# Patient Record
Sex: Male | Born: 1940
Health system: Southern US, Community
[De-identification: ages and names within clinical notes are randomized; demographics above are authoritative.]

## PROBLEM LIST (undated history)

## (undated) DIAGNOSIS — I2510 Atherosclerotic heart disease of native coronary artery without angina pectoris: Secondary | ICD-10-CM

## (undated) DIAGNOSIS — I700 Atherosclerosis of aorta: Secondary | ICD-10-CM

## (undated) DIAGNOSIS — G609 Hereditary and idiopathic neuropathy, unspecified: Secondary | ICD-10-CM

## (undated) DIAGNOSIS — H269 Unspecified cataract: Secondary | ICD-10-CM

## (undated) DIAGNOSIS — B019 Varicella without complication: Secondary | ICD-10-CM

## (undated) DIAGNOSIS — R42 Dizziness and giddiness: Secondary | ICD-10-CM

## (undated) DIAGNOSIS — R1310 Dysphagia, unspecified: Secondary | ICD-10-CM

## (undated) DIAGNOSIS — E079 Disorder of thyroid, unspecified: Secondary | ICD-10-CM

## (undated) DIAGNOSIS — E785 Hyperlipidemia, unspecified: Secondary | ICD-10-CM

## (undated) DIAGNOSIS — M549 Dorsalgia, unspecified: Secondary | ICD-10-CM

## (undated) DIAGNOSIS — E039 Hypothyroidism, unspecified: Secondary | ICD-10-CM

## (undated) DIAGNOSIS — I10 Essential (primary) hypertension: Secondary | ICD-10-CM

## (undated) DIAGNOSIS — B059 Measles without complication: Secondary | ICD-10-CM

## (undated) DIAGNOSIS — B269 Mumps without complication: Secondary | ICD-10-CM

## (undated) DIAGNOSIS — E669 Obesity, unspecified: Secondary | ICD-10-CM

## (undated) DIAGNOSIS — E119 Type 2 diabetes mellitus without complications: Secondary | ICD-10-CM

## (undated) DIAGNOSIS — K37 Unspecified appendicitis: Secondary | ICD-10-CM

## (undated) DIAGNOSIS — C439 Malignant melanoma of skin, unspecified: Secondary | ICD-10-CM

## (undated) DIAGNOSIS — E1169 Type 2 diabetes mellitus with other specified complication: Secondary | ICD-10-CM

## (undated) DIAGNOSIS — Z0000 Encounter for general adult medical examination without abnormal findings: Secondary | ICD-10-CM

## (undated) HISTORY — DX: Type 2 diabetes mellitus without complications: E11.9

## (undated) HISTORY — DX: Mumps without complication: B26.9

## (undated) HISTORY — DX: Obesity, unspecified: E66.9

## (undated) HISTORY — DX: Dizziness and giddiness: R42

## (undated) HISTORY — DX: Atherosclerotic heart disease of native coronary artery without angina pectoris: I25.10

## (undated) HISTORY — DX: Dysphagia, unspecified: R13.10

## (undated) HISTORY — PX: KNEE ARTHROSCOPY: SHX127

## (undated) HISTORY — PX: CORONARY ANGIOPLASTY WITH STENT PLACEMENT: SHX49

## (undated) HISTORY — DX: Hyperlipidemia, unspecified: E78.5

## (undated) HISTORY — DX: Measles without complication: B05.9

## (undated) HISTORY — DX: Dorsalgia, unspecified: M54.9

## (undated) HISTORY — PX: APPENDECTOMY: SHX54

## (undated) HISTORY — DX: Unspecified cataract: H26.9

## (undated) HISTORY — DX: Disorder of thyroid, unspecified: E07.9

## (undated) HISTORY — DX: Encounter for general adult medical examination without abnormal findings: Z00.00

## (undated) HISTORY — PX: BACK SURGERY: SHX140

## (undated) HISTORY — DX: Unspecified appendicitis: K37

## (undated) HISTORY — DX: Essential (primary) hypertension: I10

## (undated) HISTORY — PX: CARDIAC CATHETERIZATION: SHX172

## (undated) HISTORY — DX: Hypothyroidism, unspecified: E03.9

## (undated) HISTORY — DX: Type 2 diabetes mellitus with other specified complication: E11.69

## (undated) HISTORY — DX: Malignant melanoma of skin, unspecified: C43.9

## (undated) HISTORY — DX: Varicella without complication: B01.9

## (undated) HISTORY — DX: Hereditary and idiopathic neuropathy, unspecified: G60.9

## (undated) HISTORY — DX: Atherosclerosis of aorta: I70.0

## (undated) HISTORY — PX: TONSILLECTOMY AND ADENOIDECTOMY: SUR1326

## (undated) HISTORY — PX: ROTATOR CUFF REPAIR: SHX139

## (undated) HISTORY — PX: SKIN CANCER EXCISION: SHX779

## (undated) HISTORY — PX: WISDOM TOOTH EXTRACTION: SHX21

---

## 2011-12-27 LAB — HM COLONOSCOPY

## 2013-10-30 DIAGNOSIS — R059 Cough, unspecified: Secondary | ICD-10-CM | POA: Diagnosis not present

## 2013-10-30 DIAGNOSIS — J00 Acute nasopharyngitis [common cold]: Secondary | ICD-10-CM | POA: Diagnosis not present

## 2013-10-31 DIAGNOSIS — R059 Cough, unspecified: Secondary | ICD-10-CM | POA: Diagnosis not present

## 2013-10-31 DIAGNOSIS — J209 Acute bronchitis, unspecified: Secondary | ICD-10-CM | POA: Diagnosis not present

## 2013-12-04 DIAGNOSIS — E1149 Type 2 diabetes mellitus with other diabetic neurological complication: Secondary | ICD-10-CM | POA: Diagnosis not present

## 2013-12-04 DIAGNOSIS — I2510 Atherosclerotic heart disease of native coronary artery without angina pectoris: Secondary | ICD-10-CM | POA: Diagnosis not present

## 2013-12-04 DIAGNOSIS — E119 Type 2 diabetes mellitus without complications: Secondary | ICD-10-CM | POA: Diagnosis not present

## 2013-12-04 DIAGNOSIS — E1139 Type 2 diabetes mellitus with other diabetic ophthalmic complication: Secondary | ICD-10-CM | POA: Diagnosis not present

## 2013-12-04 DIAGNOSIS — E1129 Type 2 diabetes mellitus with other diabetic kidney complication: Secondary | ICD-10-CM | POA: Diagnosis not present

## 2013-12-04 DIAGNOSIS — H348392 Tributary (branch) retinal vein occlusion, unspecified eye, stable: Secondary | ICD-10-CM | POA: Diagnosis not present

## 2013-12-04 DIAGNOSIS — C439 Malignant melanoma of skin, unspecified: Secondary | ICD-10-CM | POA: Diagnosis not present

## 2013-12-04 DIAGNOSIS — R809 Proteinuria, unspecified: Secondary | ICD-10-CM | POA: Diagnosis not present

## 2013-12-04 DIAGNOSIS — E039 Hypothyroidism, unspecified: Secondary | ICD-10-CM | POA: Diagnosis not present

## 2013-12-12 ENCOUNTER — Encounter: Payer: Self-pay | Admitting: Physician Assistant

## 2013-12-12 ENCOUNTER — Ambulatory Visit (INDEPENDENT_AMBULATORY_CARE_PROVIDER_SITE_OTHER): Payer: Medicare Other | Admitting: Physician Assistant

## 2013-12-12 VITALS — BP 124/76 | HR 67 | Temp 97.7°F | Resp 16 | Ht 71.0 in | Wt 216.2 lb

## 2013-12-12 DIAGNOSIS — E039 Hypothyroidism, unspecified: Secondary | ICD-10-CM

## 2013-12-12 DIAGNOSIS — E785 Hyperlipidemia, unspecified: Secondary | ICD-10-CM | POA: Diagnosis not present

## 2013-12-12 DIAGNOSIS — E119 Type 2 diabetes mellitus without complications: Secondary | ICD-10-CM | POA: Diagnosis not present

## 2013-12-12 LAB — BASIC METABOLIC PANEL WITH GFR
BUN: 11 mg/dL (ref 6–23)
CO2: 25 mEq/L (ref 19–32)
Calcium: 9.7 mg/dL (ref 8.4–10.5)
Chloride: 103 mEq/L (ref 96–112)
Creat: 1 mg/dL (ref 0.50–1.35)
GFR, Est African American: 87 mL/min
GFR, Est Non African American: 75 mL/min
Glucose, Bld: 118 mg/dL — ABNORMAL HIGH (ref 70–99)
Potassium: 4.5 mEq/L (ref 3.5–5.3)
Sodium: 138 mEq/L (ref 135–145)

## 2013-12-12 LAB — TSH: TSH: 1.211 u[IU]/mL (ref 0.350–4.500)

## 2013-12-12 LAB — T4, FREE: Free T4: 1.21 ng/dL (ref 0.80–1.80)

## 2013-12-12 NOTE — Patient Instructions (Signed)
Please obtain labs. I will call you with your results. We will change thyroid medication as needed and schedule follow-up.

## 2013-12-12 NOTE — Progress Notes (Signed)
Pre visit review using our clinic review tool, if applicable. No additional management support is needed unless otherwise documented below in the visit note/SLS  

## 2013-12-13 LAB — URINALYSIS, ROUTINE W REFLEX MICROSCOPIC
Bilirubin Urine: NEGATIVE
Glucose, UA: NEGATIVE mg/dL
Hgb urine dipstick: NEGATIVE
Ketones, ur: NEGATIVE mg/dL
Leukocytes, UA: NEGATIVE
Nitrite: NEGATIVE
Protein, ur: NEGATIVE mg/dL
Specific Gravity, Urine: 1.022 (ref 1.005–1.030)
Urobilinogen, UA: 0.2 mg/dL (ref 0.0–1.0)
pH: 5 (ref 5.0–8.0)

## 2013-12-13 LAB — URINALYSIS, MICROSCOPIC ONLY
Bacteria, UA: NONE SEEN
Casts: NONE SEEN
Crystals: NONE SEEN
Squamous Epithelial / LPF: NONE SEEN

## 2013-12-13 LAB — MICROALBUMIN / CREATININE URINE RATIO
Creatinine, Urine: 253.2 mg/dL
Microalb Creat Ratio: 3.3 mg/g (ref 0.0–30.0)
Microalb, Ur: 0.83 mg/dL (ref 0.00–1.89)

## 2013-12-17 ENCOUNTER — Telehealth: Payer: Self-pay | Admitting: Physician Assistant

## 2013-12-17 NOTE — Telephone Encounter (Signed)
Patient informed, understood & agreed; he will drop off previous medical records at office/SLS

## 2013-12-17 NOTE — Telephone Encounter (Signed)
Patient had labs 12-12-2013.  Would like results

## 2013-12-22 DIAGNOSIS — E119 Type 2 diabetes mellitus without complications: Secondary | ICD-10-CM | POA: Insufficient documentation

## 2013-12-22 DIAGNOSIS — E039 Hypothyroidism, unspecified: Secondary | ICD-10-CM | POA: Insufficient documentation

## 2013-12-22 DIAGNOSIS — E785 Hyperlipidemia, unspecified: Secondary | ICD-10-CM | POA: Insufficient documentation

## 2013-12-22 NOTE — Progress Notes (Signed)
Patient ID: Jack Wade, male   DOB: 08-15-1941, 72 y.o.   MRN: [id number]  Patient presents to clinic for medication management and recheck of thyroid. Patient has never been seen in this clinic before. Patient is seeing this provider for an acute visit (OK'd by management) and will be establishing care with Dr. Abner Greenspan at the beginning of 2015.   patient suffers from hypothyroidism, for which he takes levothyroxine. Patient states he saw his previous primary care provider 2 weeks ago and his TSH levels were low. Patient is concerned that he may be on too much thyroid medication. Patient denies anxiety, palpitations, heat intolerance, skin changes. Does endorse small amount of weight loss over the past few months. Denies constitutional symptoms. Patient is still been taking his levothyroxine as prescribed.  Patient also requesting refill of his Januvia. Patient has diagnosis of type 2 diabetes for which he takes metformin, glipizide and Januvia. Has been a Januvia for several months. Patient states he just had his A1c checked by his previous PCP and states it was slightly elevated. We'll need to obtain records. Can recheck blood glucose. Discussed with patient however, that Medicare would not pay for another A1C his last A1c was drawn two weeks ago. Patient denies urinary frequency, foot ulcer, neuropathy or change in vision. Patient has seen the eye doctor within the past year. Patient is not currently on ACE inhibitor therapy.   Past Medical History  Diagnosis Date  . Hyperlipidemia   . Hypertension   . Heart disease     2 stents  . Hypothyroidism   . Thyroid disease     Beoming Hyperthyroidism  . Melanoma     Scalp  . Diabetes type 2, controlled   . Chicken pox   . Measles   . Appendicitis     No current outpatient prescriptions on file prior to visit.   No current facility-administered medications on file prior to visit.    No Known Allergies  Family History  Problem Relation  Age of Onset  . Heart disease Father   . Emphysema Father   . Lung cancer Father     Deceased  . Breast cancer Mother   . Heart disease Mother     Deceased  . Colon cancer Maternal Grandmother   . Diabetes Brother   . Heart disease Brother   . Healthy Sister     History   Social History  . Marital Status: Married    Spouse Name: N/A    Number of Children: N/A  . Years of Education: N/A   Social History Main Topics  . Smoking status: Former Games developer  . Smokeless tobacco: Never Used  . Alcohol Use: No  . Drug Use: No  . Sexual Activity: No   Other Topics Concern  . None   Social History Narrative  . None   Review of Systems - See HPI.  All other ROS are negative.  Filed Vitals:   12/12/13 1102  BP: 124/76  Pulse: 67  Temp: 97.7 F (36.5 C)  Resp: 16   Physical Exam  Vitals reviewed. Constitutional: He is oriented to person, place, and time and well-developed, well-nourished, and in no distress.  HENT:  Head: Normocephalic and atraumatic.  Right Ear: External ear normal.  Left Ear: External ear normal.  Nose: Nose normal.  Mouth/Throat: Oropharynx is clear and moist. No oropharyngeal exudate.  Tympanic membranes within normal limits bilaterally.  Eyes: Conjunctivae and EOM are normal. Pupils are equal, round, and  reactive to light.  Neck: Neck supple.  Cardiovascular: Normal rate, regular rhythm, normal heart sounds and intact distal pulses.   Pulmonary/Chest: Effort normal and breath sounds normal. No respiratory distress. He has no wheezes. He has no rales. He exhibits no tenderness.  Abdominal: Soft. Bowel sounds are normal. He exhibits no distension and no mass. There is no tenderness. There is no rebound and no guarding.  Lymphadenopathy:    He has no cervical adenopathy.  Neurological: He is alert and oriented to person, place, and time.  Skin: Skin is warm and dry. No rash noted.  Psychiatric: Affect normal.   Assessment/Plan: Diabetes We'll  obtain A1c from prior PCP. Sample of Januvia given to patient. Will check BMP, urinalysis and urine microalbumin.  Hypothyroidism Recheck TSH and free T4. Will Alter dosage of levothyroxine if necessary.

## 2013-12-22 NOTE — Assessment & Plan Note (Signed)
We'll obtain A1c from prior PCP. Sample of Januvia given to patient. Will check BMP, urinalysis and urine microalbumin.

## 2013-12-22 NOTE — Assessment & Plan Note (Signed)
Recheck TSH and free T4. Will Alter dosage of levothyroxine if necessary.

## 2014-03-10 ENCOUNTER — Ambulatory Visit: Payer: Medicare Other | Admitting: Family Medicine

## 2014-03-13 ENCOUNTER — Ambulatory Visit: Payer: Medicare Other | Admitting: Family Medicine

## 2014-03-20 ENCOUNTER — Ambulatory Visit (INDEPENDENT_AMBULATORY_CARE_PROVIDER_SITE_OTHER): Payer: Medicare Other | Admitting: Family Medicine

## 2014-03-20 ENCOUNTER — Encounter: Payer: Self-pay | Admitting: Family Medicine

## 2014-03-20 ENCOUNTER — Telehealth: Payer: Self-pay | Admitting: Family Medicine

## 2014-03-20 VITALS — BP 118/80 | HR 63 | Temp 97.7°F | Ht 72.0 in | Wt 219.0 lb

## 2014-03-20 DIAGNOSIS — E039 Hypothyroidism, unspecified: Secondary | ICD-10-CM

## 2014-03-20 DIAGNOSIS — E785 Hyperlipidemia, unspecified: Secondary | ICD-10-CM

## 2014-03-20 DIAGNOSIS — M25579 Pain in unspecified ankle and joints of unspecified foot: Secondary | ICD-10-CM

## 2014-03-20 DIAGNOSIS — Z23 Encounter for immunization: Secondary | ICD-10-CM

## 2014-03-20 DIAGNOSIS — I10 Essential (primary) hypertension: Secondary | ICD-10-CM | POA: Diagnosis not present

## 2014-03-20 DIAGNOSIS — Z87891 Personal history of nicotine dependence: Secondary | ICD-10-CM

## 2014-03-20 DIAGNOSIS — Z85820 Personal history of malignant melanoma of skin: Secondary | ICD-10-CM | POA: Insufficient documentation

## 2014-03-20 DIAGNOSIS — C439 Malignant melanoma of skin, unspecified: Secondary | ICD-10-CM | POA: Insufficient documentation

## 2014-03-20 DIAGNOSIS — I25810 Atherosclerosis of coronary artery bypass graft(s) without angina pectoris: Secondary | ICD-10-CM | POA: Diagnosis not present

## 2014-03-20 DIAGNOSIS — E119 Type 2 diabetes mellitus without complications: Secondary | ICD-10-CM | POA: Diagnosis not present

## 2014-03-20 DIAGNOSIS — R1310 Dysphagia, unspecified: Secondary | ICD-10-CM

## 2014-03-20 DIAGNOSIS — Z0000 Encounter for general adult medical examination without abnormal findings: Secondary | ICD-10-CM

## 2014-03-20 HISTORY — DX: Dysphagia, unspecified: R13.10

## 2014-03-20 LAB — T4, FREE: Free T4: 1.3 ng/dL (ref 0.80–1.80)

## 2014-03-20 LAB — RENAL FUNCTION PANEL
Albumin: 4.1 g/dL (ref 3.5–5.2)
BUN: 14 mg/dL (ref 6–23)
CO2: 25 mEq/L (ref 19–32)
Calcium: 8.8 mg/dL (ref 8.4–10.5)
Chloride: 106 mEq/L (ref 96–112)
Creat: 1.07 mg/dL (ref 0.50–1.35)
Glucose, Bld: 127 mg/dL — ABNORMAL HIGH (ref 70–99)
Phosphorus: 2.1 mg/dL — ABNORMAL LOW (ref 2.3–4.6)
Potassium: 4.1 mEq/L (ref 3.5–5.3)
Sodium: 138 mEq/L (ref 135–145)

## 2014-03-20 LAB — CBC
HCT: 42.5 % (ref 39.0–52.0)
Hemoglobin: 14.8 g/dL (ref 13.0–17.0)
MCH: 28.1 pg (ref 26.0–34.0)
MCHC: 34.8 g/dL (ref 30.0–36.0)
MCV: 80.8 fL (ref 78.0–100.0)
Platelets: 214 10*3/uL (ref 150–400)
RBC: 5.26 MIL/uL (ref 4.22–5.81)
RDW: 13.9 % (ref 11.5–15.5)
WBC: 7.2 10*3/uL (ref 4.0–10.5)

## 2014-03-20 LAB — HEPATIC FUNCTION PANEL
ALT: 15 U/L (ref 0–53)
AST: 20 U/L (ref 0–37)
Albumin: 4.1 g/dL (ref 3.5–5.2)
Alkaline Phosphatase: 60 U/L (ref 39–117)
Bilirubin, Direct: 0.1 mg/dL (ref 0.0–0.3)
Indirect Bilirubin: 0.2 mg/dL (ref 0.2–1.2)
Total Bilirubin: 0.3 mg/dL (ref 0.2–1.2)
Total Protein: 6.6 g/dL (ref 6.0–8.3)

## 2014-03-20 LAB — HEMOGLOBIN A1C
Hgb A1c MFr Bld: 7.3 % — ABNORMAL HIGH (ref <5.7)
Mean Plasma Glucose: 163 mg/dL — ABNORMAL HIGH (ref <117)

## 2014-03-20 LAB — LIPID PANEL
Cholesterol: 130 mg/dL (ref 0–200)
HDL: 37 mg/dL — ABNORMAL LOW (ref >39)
LDL Cholesterol: 49 mg/dL (ref 0–99)
Total CHOL/HDL Ratio: 3.5 Ratio
Triglycerides: 220 mg/dL — ABNORMAL HIGH (ref <150)
VLDL: 44 mg/dL — ABNORMAL HIGH (ref 0–40)

## 2014-03-20 LAB — TSH: TSH: 2.571 u[IU]/mL (ref 0.350–4.500)

## 2014-03-20 NOTE — Progress Notes (Signed)
Pre visit review using our clinic review tool, if applicable. No additional management support is needed unless otherwise documented below in the visit note. 

## 2014-03-20 NOTE — Telephone Encounter (Signed)
Relevant patient education assigned to patient using Emmi. ° °

## 2014-03-20 NOTE — Progress Notes (Signed)
Patient ID: Jack Wade, male   DOB: [birth year], 73 y.o.   MRN: [id number] Jack Wade [id number] 1941-11-14 03/20/2014      Progress Note-Follow Up  Subjective  Chief Complaint  Chief Complaint  Patient presents with  . Establish Care    new patient  . Injections    tdap    HPI  Patient is a 73 year old male in today for routine medical care. He is here to establish care. He is generally done well but does offer some mild concerns. One some intermittent pain in the ball of his right foot. No redness or warmth. No injury. It is not present today. He also some intermittent low back pain. He wears glasses. He quit smoking many years ago. Notes some mild dysphasia occasionally. Has recenty moved to the area. Denies CP/palp/SOB/HA/congestion/fevers/GI or GU c/o. Taking meds as prescribed  Past Medical History  Diagnosis Date  . Hyperlipidemia   . Hypertension   . Heart disease     2 stents  . Hypothyroidism   . Thyroid disease     Beoming Hyperthyroidism  . Melanoma     Scalp  . Diabetes type 2, controlled   . Chicken pox   . Measles   . Appendicitis   . Mumps as a child    Past Surgical History  Procedure Laterality Date  . Appendectomy    . Coronary angioplasty with stent placement      2 stents  . Cardiac catheterization    . Back surgery      x2 lumbar  . Rotator cuff repair    . Wisdom tooth extraction    . Tonsillectomy and adenoidectomy      Family History  Problem Relation Age of Onset  . Heart disease Father   . Emphysema Father   . Hypertension Father   . Cancer Father     lung/ healthy  . Heart disease Mother     Deceased  . Cancer Mother     breast  . Hypertension Mother   . Cancer Maternal Grandmother     colon  . Diabetes Brother     type 2  . Heart disease Brother     History   Social History  . Marital Status: Married    Spouse Name: N/A    Number of Children: N/A  . Years of Education: N/A   Occupational History   . Not on file.   Social History Main Topics  . Smoking status: Former Smoker -- 2.00 packs/day for 30 years    Types: Cigarettes    Start date: 12/27/1983  . Smokeless tobacco: Never Used  . Alcohol Use: Yes     Comment: occasionally  . Drug Use: No  . Sexual Activity: No   Other Topics Concern  . Not on file   Social History Narrative  . No narrative on file    Current Outpatient Prescriptions on File Prior to Visit  Medication Sig Dispense Refill  . aspirin 81 MG tablet Take 81 mg by mouth daily.      Marland Kitchen glipiZIDE (GLUCOTROL) 5 MG tablet Take 20 mg by mouth 2 (two) times daily before a meal.      . levothyroxine (SYNTHROID, LEVOTHROID) 100 MCG tablet Take 100 mcg by mouth daily before breakfast.      . metFORMIN (GLUCOPHAGE) 500 MG tablet Take 2,000 mg by mouth daily with breakfast.      . simvastatin (ZOCOR) 20 MG tablet Take 20  mg by mouth at bedtime.      . sitaGLIPtin (JANUVIA) 100 MG tablet Take 100 mg by mouth daily.       No current facility-administered medications on file prior to visit.    No Known Allergies  Review of Systems  Review of Systems  Constitutional: Negative for fever and malaise/fatigue.  HENT: Negative for congestion.   Eyes: Negative for discharge.  Respiratory: Negative for shortness of breath.   Cardiovascular: Negative for chest pain, palpitations and leg swelling.  Gastrointestinal: Negative for nausea, abdominal pain and diarrhea.  Genitourinary: Negative for dysuria.  Musculoskeletal: Negative for falls.  Skin: Negative for rash.  Neurological: Negative for loss of consciousness and headaches.  Endo/Heme/Allergies: Negative for polydipsia.  Psychiatric/Behavioral: Negative for depression and suicidal ideas. The patient is not nervous/anxious and does not have insomnia.     Objective  BP 118/80  Pulse 63  Temp(Src) 97.7 F (36.5 C) (Oral)  Ht 6' (1.829 m)  Wt 219 lb 0.6 oz (99.356 kg)  BMI 29.70 kg/m2  SpO2 97%  Physical  Exam  Physical Exam  Constitutional: He is oriented to person, place, and time and well-developed, well-nourished, and in no distress. No distress.  HENT:  Head: Normocephalic and atraumatic.  Eyes: Conjunctivae are normal.  Neck: Neck supple. No thyromegaly present.  Cardiovascular: Normal rate, regular rhythm and normal heart sounds.   No murmur heard. Pulmonary/Chest: Effort normal and breath sounds normal. No respiratory distress.  Abdominal: He exhibits no distension and no mass. There is no tenderness.  Musculoskeletal: He exhibits no edema.  Neurological: He is alert and oriented to person, place, and time.  Skin: Skin is warm.  Psychiatric: Memory, affect and judgment normal.    Lab Results  Component Value Date   TSH 1.211 12/12/2013   No results found for this basename: WBC, HGB, HCT, MCV, PLT   Lab Results  Component Value Date   CREATININE 1.00 12/12/2013   BUN 11 12/12/2013   NA 138 12/12/2013   K 4.5 12/12/2013   CL 103 12/12/2013   CO2 25 12/12/2013    Assessment & Plan  Hypothyroidism On Levothyroxine, continue to monitor  Melanoma His wife is seeing a dermatologist soon, he will establish care there if he is please  Preventative health care tdap today, will obtain and review old records  Pain in joint, ankle and foot Tolerable but intermittent. Declines referral, consider  Orthotics and topical treatments  Hyperlipidemia Tolerating statin, encouraged heart healthy diet, avoid trans fats, minimize simple carbs and saturated fats. Increase exercise as tolerated  HTN (hypertension) Well controlled, no changes to meds. Encouraged heart healthy diet such as the DASH diet and exercise as tolerated.   Diabetes hgba1c acceptable, minimize simple carbs. Increase exercise as tolerated. Continue current meds  CAD (coronary artery disease) Is doing well but is new to the area. Will refer to cardiology for further surveillance

## 2014-03-20 NOTE — Patient Instructions (Signed)
Preventive Care for Adults, Male A healthy lifestyle and preventive care can promote health and wellness. Preventive health guidelines for men include the following key practices:  A routine yearly physical is a good way to check with your health care provider about your health and preventative screening. It is a chance to share any concerns and updates on your health and to receive a thorough exam.  Visit your dentist for a routine exam and preventative care every 6 months. Brush your teeth twice a day and floss once a day. Good oral hygiene prevents tooth decay and gum disease.  The frequency of eye exams is based on your age, health, family medical history, use of contact lenses, and other factors. Follow your health care provider's recommendations for frequency of eye exams.  Eat a healthy diet. Foods such as vegetables, fruits, whole grains, low-fat dairy products, and lean protein foods contain the nutrients you need without too many calories. Decrease your intake of foods high in solid fats, added sugars, and salt. Eat the right amount of calories for you.Get information about a proper diet from your health care provider, if necessary.  Regular physical exercise is one of the most important things you can do for your health. Most adults should get at least 150 minutes of moderate-intensity exercise (any activity that increases your heart rate and causes you to sweat) each week. In addition, most adults need muscle-strengthening exercises on 2 or more days a week.  Maintain a healthy weight. The body mass index (BMI) is a screening tool to identify possible weight problems. It provides an estimate of body fat based on height and weight. Your health care provider can find your BMI and can help you achieve or maintain a healthy weight.For adults 20 years and older:  A BMI below 18.5 is considered underweight.  A BMI of 18.5 to 24.9 is normal.  A BMI of 25 to 29.9 is considered  overweight.  A BMI of 30 and above is considered obese.  Maintain normal blood lipids and cholesterol levels by exercising and minimizing your intake of saturated fat. Eat a balanced diet with plenty of fruit and vegetables. Blood tests for lipids and cholesterol should begin at age 42 and be repeated every 5 years. If your lipid or cholesterol levels are high, you are over 50, or you are at high risk for heart disease, you may need your cholesterol levels checked more frequently.Ongoing high lipid and cholesterol levels should be treated with medicines if diet and exercise are not working.  If you smoke, find out from your health care provider how to quit. If you do not use tobacco, do not start.  Lung cancer screening is recommended for adults aged 24 80 years who are at high risk for developing lung cancer because of a history of smoking. A yearly low-dose CT scan of the lungs is recommended for people who have at least a 30-pack-year history of smoking and are a current smoker or have quit within the past 15 years. A pack year of smoking is smoking an average of 1 pack of cigarettes a day for 1 year (for example: 1 pack a day for 30 years or 2 packs a day for 15 years). Yearly screening should continue until the smoker has stopped smoking for at least 15 years. Yearly screening should be stopped for people who develop a health problem that would prevent them from having lung cancer treatment.  If you choose to drink alcohol, do not have  more than 2 drinks per day. One drink is considered to be 12 ounces (355 mL) of beer, 5 ounces (148 mL) of wine, or 1.5 ounces (44 mL) of liquor.  Avoid use of street drugs. Do not share needles with anyone. Ask for help if you need support or instructions about stopping the use of drugs.  High blood pressure causes heart disease and increases the risk of stroke. Your blood pressure should be checked at least every 1 2 years. Ongoing high blood pressure should be  treated with medicines, if weight loss and exercise are not effective.  If you are 75 72 years old, ask your health care provider if you should take aspirin to prevent heart disease.  Diabetes screening involves taking a blood sample to check your fasting blood sugar level. This should be done once every 3 years, after age 19, if you are within normal weight and without risk factors for diabetes. Testing should be considered at a younger age or be carried out more frequently if you are overweight and have at least 1 risk factor for diabetes.  Colorectal cancer can be detected and often prevented. Most routine colorectal cancer screening begins at the age of 47 and continues through age 80. However, your health care provider may recommend screening at an earlier age if you have risk factors for colon cancer. On a yearly basis, your health care provider may provide home test kits to check for hidden blood in the stool. Use of a small camera at the end of a tube to directly examine the colon (sigmoidoscopy or colonoscopy) can detect the earliest forms of colorectal cancer. Talk to your health care provider about this at age 66, when routine screening begins. Direct exam of the colon should be repeated every 5 10 years through age 19, unless early forms of precancerous polyps or small growths are found.  People who are at an increased risk for hepatitis B should be screened for this virus. You are considered at high risk for hepatitis B if:  You were born in a country where hepatitis B occurs often. Talk with your health care provider about which countries are considered high-risk.  Your parents were born in a high-risk country and you have not received a shot to protect against hepatitis B (hepatitis B vaccine).  You have HIV or AIDS.  You use needles to inject street drugs.  You live with, or have sex with, someone who has hepatitis B.  You are a man who has sex with other men (MSM).  You get  hemodialysis treatment.  You take certain medicines for conditions such as cancer, organ transplantation, and autoimmune conditions.  Hepatitis C blood testing is recommended for all people born from [birth year] through 1965 and any individual with known risks for hepatitis C.  Practice safe sex. Use condoms and avoid high-risk sexual practices to reduce the spread of sexually transmitted infections (STIs). STIs include gonorrhea, chlamydia, syphilis, trichomonas, herpes, HPV, and human immunodeficiency virus (HIV). Herpes, HIV, and HPV are viral illnesses that have no cure. They can result in disability, cancer, and death.  A one-time screening for abdominal aortic aneurysm (AAA) and surgical repair of large AAAs by ultrasound are recommended for men ages 94 to 74 years who are current or former smokers.  Healthy men should no longer receive prostate-specific antigen (PSA) blood tests as part of routine cancer screening. Talk with your health care provider about prostate cancer screening.  Testicular cancer screening is not recommended  for adult males who have no symptoms. Screening includes self-exam, a health care provider exam, and other screening tests. Consult with your health care provider about any symptoms you have or any concerns you have about testicular cancer.  Use sunscreen. Apply sunscreen liberally and repeatedly throughout the day. You should seek shade when your shadow is shorter than you. Protect yourself by wearing long sleeves, pants, a wide-brimmed hat, and sunglasses year round, whenever you are outdoors.  Once a month, do a whole-body skin exam, using a mirror to look at the skin on your back. Tell your health care provider about new moles, moles that have irregular borders, moles that are larger than a pencil eraser, or moles that have changed in shape or color.  Stay current with required vaccines (immunizations).  Influenza vaccine. All adults should be immunized every  year.  Tetanus, diphtheria, and acellular pertussis (Td, Tdap) vaccine. An adult who has not previously received Tdap or who does not know his vaccine status should receive 1 dose of Tdap. This initial dose should be followed by tetanus and diphtheria toxoids (Td) booster doses every 10 years. Adults with an unknown or incomplete history of completing a 3-dose immunization series with Td-containing vaccines should begin or complete a primary immunization series including a Tdap dose. Adults should receive a Td booster every 10 years.  Varicella vaccine. An adult without evidence of immunity to varicella should receive 2 doses or a second dose if he has previously received 1 dose.  Human papillomavirus (HPV) vaccine. Males aged 44 21 years who have not received the vaccine previously should receive the 3-dose series. Males aged 43 26 years may be immunized. Immunization is recommended through the age of 50 years for any male who has sex with males and did not get any or all doses earlier. Immunization is recommended for any person with an immunocompromised condition through the age of 23 years if he did not get any or all doses earlier. During the 3-dose series, the second dose should be obtained 4 8 weeks after the first dose. The third dose should be obtained 24 weeks after the first dose and 16 weeks after the second dose.  Zoster vaccine. One dose is recommended for adults aged 96 years or older unless certain conditions are present.  Measles, mumps, and rubella (MMR) vaccine. Adults born before 55 generally are considered immune to measles and mumps. Adults born in [birth year] or later should have 1 or more doses of MMR vaccine unless there is a contraindication to the vaccine or there is laboratory evidence of immunity to each of the three diseases. A routine second dose of MMR vaccine should be obtained at least 28 days after the first dose for students attending postsecondary schools, health care  workers, or international travelers. People who received inactivated measles vaccine or an unknown type of measles vaccine during 1963 1967 should receive 2 doses of MMR vaccine. People who received inactivated mumps vaccine or an unknown type of mumps vaccine before 1979 and are at high risk for mumps infection should consider immunization with 2 doses of MMR vaccine. Unvaccinated health care workers born before [birth year] who lack laboratory evidence of measles, mumps, or rubella immunity or laboratory confirmation of disease should consider measles and mumps immunization with 2 doses of MMR vaccine or rubella immunization with 1 dose of MMR vaccine.  Pneumococcal 13-valent conjugate (PCV13) vaccine. When indicated, a person who is uncertain of his immunization history and has no record of immunization  should receive the PCV13 vaccine. An adult aged 67 years or older who has certain medical conditions and has not been previously immunized should receive 1 dose of PCV13 vaccine. This PCV13 should be followed with a dose of pneumococcal polysaccharide (PPSV23) vaccine. The PPSV23 vaccine dose should be obtained at least 8 weeks after the dose of PCV13 vaccine. An adult aged 79 years or older who has certain medical conditions and previously received 1 or more doses of PPSV23 vaccine should receive 1 dose of PCV13. The PCV13 vaccine dose should be obtained 1 or more years after the last PPSV23 vaccine dose.  Pneumococcal polysaccharide (PPSV23) vaccine. When PCV13 is also indicated, PCV13 should be obtained first. All adults aged 74 years and older should be immunized. An adult younger than age 50 years who has certain medical conditions should be immunized. Any person who resides in a nursing home or long-term care facility should be immunized. An adult smoker should be immunized. People with an immunocompromised condition and certain other conditions should receive both PCV13 and PPSV23 vaccines. People with human  immunodeficiency virus (HIV) infection should be immunized as soon as possible after diagnosis. Immunization during chemotherapy or radiation therapy should be avoided. Routine use of PPSV23 vaccine is not recommended for American Indians, [place] Natives, or people younger than 65 years unless there are medical conditions that require PPSV23 vaccine. When indicated, people who have unknown immunization and have no record of immunization should receive PPSV23 vaccine. One-time revaccination 5 years after the first dose of PPSV23 is recommended for people aged 41 64 years who have chronic kidney failure, nephrotic syndrome, asplenia, or immunocompromised conditions. People who received 1 2 doses of PPSV23 before age 15 years should receive another dose of PPSV23 vaccine at age 48 years or later if at least 5 years have passed since the previous dose. Doses of PPSV23 are not needed for people immunized with PPSV23 at or after age 69 years.  Meningococcal vaccine. Adults with asplenia or persistent complement component deficiencies should receive 2 doses of quadrivalent meningococcal conjugate (MenACWY-D) vaccine. The doses should be obtained at least 2 months apart. Microbiologists working with certain meningococcal bacteria, [place] recruits, people at risk during an outbreak, and people who travel to or live in countries with a high rate of meningitis should be immunized. A first-year college student up through age 7 years who is living in a residence hall should receive a dose if he did not receive a dose on or after his 16th birthday. Adults who have certain high-risk conditions should receive one or more doses of vaccine.  Hepatitis A vaccine. Adults who wish to be protected from this disease, have certain high-risk conditions, work with hepatitis A-infected animals, work in hepatitis A research labs, or travel to or work in countries with a high rate of hepatitis A should be immunized. Adults who were  previously unvaccinated and who anticipate close contact with an international adoptee during the first 60 days after arrival in the [place] from a country with a high rate of hepatitis A should be immunized.  Hepatitis B vaccine. Adults who wish to be protected from this disease, have certain high-risk conditions, may be exposed to blood or other infectious body fluids, are household contacts or sex partners of hepatitis B positive people, are clients or workers in certain care facilities, or travel to or work in countries with a high rate of hepatitis B should be immunized.  Haemophilus influenzae type b (Hib) vaccine. A  previously unvaccinated person with asplenia or sickle cell disease or having a scheduled splenectomy should receive 1 dose of Hib vaccine. Regardless of previous immunization, a recipient of a hematopoietic stem cell transplant should receive a 3-dose series 6 12 months after his successful transplant. Hib vaccine is not recommended for adults with HIV infection. Preventive Service / Frequency Ages 62 to 3  Blood pressure check.** / Every 1 to 2 years.  Lipid and cholesterol check.** / Every 5 years beginning at age 43.  Hepatitis C blood test.** / For any individual with known risks for hepatitis C.  Skin self-exam. / Monthly.  Influenza vaccine. / Every year.  Tetanus, diphtheria, and acellular pertussis (Tdap, Td) vaccine.** / Consult your health care provider. 1 dose of Td every 10 years.  Varicella vaccine.** / Consult your health care provider.  HPV vaccine. / 3 doses over 6 months, if 48 or younger.  Measles, mumps, rubella (MMR) vaccine.** / You need at least 1 dose of MMR if you were born in 1957 or later. You may also need a second dose.  Pneumococcal 13-valent conjugate (PCV13) vaccine.** / Consult your health care provider.  Pneumococcal polysaccharide (PPSV23) vaccine.** / 1 to 2 doses if you smoke cigarettes or if you have certain  conditions.  Meningococcal vaccine.** / 1 dose if you are age 8 to 70 years and a Market researcher living in a residence hall, or have one of several medical conditions. You may also need additional booster doses.  Hepatitis A vaccine.** / Consult your health care provider.  Hepatitis B vaccine.** / Consult your health care provider.  Haemophilus influenzae type b (Hib) vaccine.** / Consult your health care provider. Ages 48 to 32  Blood pressure check.** / Every 1 to 2 years.  Lipid and cholesterol check.** / Every 5 years beginning at age 38.  Lung cancer screening. / Every year if you are aged 40 80 years and have a 30-pack-year history of smoking and currently smoke or have quit within the past 15 years. Yearly screening is stopped once you have quit smoking for at least 15 years or develop a health problem that would prevent you from having lung cancer treatment.  Fecal occult blood test (FOBT) of stool. / Every year beginning at age 4 and continuing until age 70. You may not have to do this test if you get a colonoscopy every 10 years.  Flexible sigmoidoscopy** or colonoscopy.** / Every 5 years for a flexible sigmoidoscopy or every 10 years for a colonoscopy beginning at age 76 and continuing until age 62.  Hepatitis C blood test.** / For all people born from [birth year] through 1965 and any individual with known risks for hepatitis C.  Skin self-exam. / Monthly.  Influenza vaccine. / Every year.  Tetanus, diphtheria, and acellular pertussis (Tdap/Td) vaccine.** / Consult your health care provider. 1 dose of Td every 10 years.  Varicella vaccine.** / Consult your health care provider.  Zoster vaccine.** / 1 dose for adults aged 60 years or older.  Measles, mumps, rubella (MMR) vaccine.** / You need at least 1 dose of MMR if you were born in 1957 or later. You may also need a second dose.  Pneumococcal 13-valent conjugate (PCV13) vaccine.** / Consult your health care  provider.  Pneumococcal polysaccharide (PPSV23) vaccine.** / 1 to 2 doses if you smoke cigarettes or if you have certain conditions.  Meningococcal vaccine.** / Consult your health care provider.  Hepatitis A vaccine.** / Consult your health care  provider.  Hepatitis B vaccine.** / Consult your health care provider.  Haemophilus influenzae type b (Hib) vaccine.** / Consult your health care provider. Ages 65 and over  Blood pressure check.** / Every 1 to 2 years.  Lipid and cholesterol check.**/ Every 5 years beginning at age 20.  Lung cancer screening. / Every year if you are aged 55 80 years and have a 30-pack-year history of smoking and currently smoke or have quit within the past 15 years. Yearly screening is stopped once you have quit smoking for at least 15 years or develop a health problem that would prevent you from having lung cancer treatment.  Fecal occult blood test (FOBT) of stool. / Every year beginning at age 50 and continuing until age 75. You may not have to do this test if you get a colonoscopy every 10 years.  Flexible sigmoidoscopy** or colonoscopy.** / Every 5 years for a flexible sigmoidoscopy or every 10 years for a colonoscopy beginning at age 50 and continuing until age 75.  Hepatitis C blood test.** / For all people born from 1945 through 1965 and any individual with known risks for hepatitis C.  Abdominal aortic aneurysm (AAA) screening.** / A one-time screening for ages 65 to 75 years who are current or former smokers.  Skin self-exam. / Monthly.  Influenza vaccine. / Every year.  Tetanus, diphtheria, and acellular pertussis (Tdap/Td) vaccine.** / 1 dose of Td every 10 years.  Varicella vaccine.** / Consult your health care provider.  Zoster vaccine.** / 1 dose for adults aged 60 years or older.  Pneumococcal 13-valent conjugate (PCV13) vaccine.** / Consult your health care provider.  Pneumococcal polysaccharide (PPSV23) vaccine.** / 1 dose for all  adults aged 65 years and older.  Meningococcal vaccine.** / Consult your health care provider.  Hepatitis A vaccine.** / Consult your health care provider.  Hepatitis B vaccine.** / Consult your health care provider.  Haemophilus influenzae type b (Hib) vaccine.** / Consult your health care provider. **Family history and personal history of risk and conditions may change your health care provider's recommendations. Document Released: 02/07/2002 Document Revised: 10/02/2013 Document Reviewed: 05/09/2011 [employer] Patient Information 2014 [employer].  

## 2014-03-23 ENCOUNTER — Encounter: Payer: Self-pay | Admitting: Family Medicine

## 2014-03-23 DIAGNOSIS — I10 Essential (primary) hypertension: Secondary | ICD-10-CM | POA: Insufficient documentation

## 2014-03-23 DIAGNOSIS — Z87891 Personal history of nicotine dependence: Secondary | ICD-10-CM | POA: Insufficient documentation

## 2014-03-23 DIAGNOSIS — M25579 Pain in unspecified ankle and joints of unspecified foot: Secondary | ICD-10-CM | POA: Insufficient documentation

## 2014-03-23 DIAGNOSIS — Z951 Presence of aortocoronary bypass graft: Secondary | ICD-10-CM | POA: Insufficient documentation

## 2014-03-23 DIAGNOSIS — Z0000 Encounter for general adult medical examination without abnormal findings: Secondary | ICD-10-CM | POA: Insufficient documentation

## 2014-03-23 NOTE — Assessment & Plan Note (Signed)
On Levothyroxine, continue to monitor 

## 2014-03-23 NOTE — Assessment & Plan Note (Signed)
Tolerable but intermittent. Declines referral, consider  Orthotics and topical treatments

## 2014-03-23 NOTE — Assessment & Plan Note (Signed)
tdap today, will obtain and review old records

## 2014-03-23 NOTE — Assessment & Plan Note (Signed)
Tolerating statin, encouraged heart healthy diet, avoid trans fats, minimize simple carbs and saturated fats. Increase exercise as tolerated 

## 2014-03-23 NOTE — Assessment & Plan Note (Signed)
Is doing well but is new to the area. Will refer to cardiology for further surveillance

## 2014-03-23 NOTE — Assessment & Plan Note (Signed)
His wife is seeing a dermatologist soon, he will establish care there if he is please

## 2014-03-23 NOTE — Assessment & Plan Note (Signed)
Well controlled, no changes to meds. Encouraged heart healthy diet such as the DASH diet and exercise as tolerated.  °

## 2014-03-23 NOTE — Assessment & Plan Note (Signed)
hgba1c acceptable, minimize simple carbs. Increase exercise as tolerated. Continue current meds 

## 2014-04-01 ENCOUNTER — Telehealth: Payer: Self-pay

## 2014-04-01 NOTE — Telephone Encounter (Signed)
Relevant patient education assigned to patient using Emmi. ° °

## 2014-04-03 ENCOUNTER — Ambulatory Visit: Payer: Medicare Other | Admitting: Physician Assistant

## 2014-04-04 ENCOUNTER — Encounter: Payer: Self-pay | Admitting: Physician Assistant

## 2014-04-04 ENCOUNTER — Ambulatory Visit (INDEPENDENT_AMBULATORY_CARE_PROVIDER_SITE_OTHER): Payer: Medicare Other | Admitting: Physician Assistant

## 2014-04-04 VITALS — BP 118/68 | HR 65 | Temp 97.8°F | Resp 16 | Ht 72.0 in | Wt 214.2 lb

## 2014-04-04 DIAGNOSIS — H6990 Unspecified Eustachian tube disorder, unspecified ear: Secondary | ICD-10-CM | POA: Diagnosis not present

## 2014-04-04 DIAGNOSIS — H6980 Other specified disorders of Eustachian tube, unspecified ear: Secondary | ICD-10-CM | POA: Diagnosis not present

## 2014-04-04 DIAGNOSIS — R42 Dizziness and giddiness: Secondary | ICD-10-CM | POA: Diagnosis not present

## 2014-04-04 DIAGNOSIS — I25810 Atherosclerosis of coronary artery bypass graft(s) without angina pectoris: Secondary | ICD-10-CM | POA: Diagnosis not present

## 2014-04-04 MED ORDER — FLUTICASONE PROPIONATE 50 MCG/ACT NA SUSP: 2.0000 | Freq: Every day | NASAL | Status: DC

## 2014-04-04 NOTE — Assessment & Plan Note (Signed)
Improved today. Negative Dix-Hallpike Testing.  Fluid behind TM.  Daily claritin.  Daily Flonase.  Limit salt intake.  Follow-up if symptoms aren't continuing to improve.

## 2014-04-04 NOTE — Patient Instructions (Signed)
Take a daily claritin.  Use flonase daily as directed.  Stay well-hydrated.  Call or return to clinic if symptoms are not improving.

## 2014-04-04 NOTE — Progress Notes (Signed)
Patient presents to clinic today c/o intermittent dizziness and nausea over the past few days.  Endorses episode of emesis at onset of symptoms.  Denies hematemesis.  Denies LH, chest pain or palpitations.  Denies recent travel or sick contact.  Denies hearing loss or tinnitus.  Vertigo not affected by position.    Past Medical History  Diagnosis Date  . Hyperlipidemia   . Hypertension   . Heart disease     2 stents  . Diabetes type 2, controlled   . Chicken pox   . Measles   . Appendicitis   . Mumps as a child  . Melanoma     Scalp. 2012  . Hypothyroidism   . Thyroid disease     Beoming Hyperthyroidism  . Dysphagia, unspecified(787.20) 03/20/2014    Current Outpatient Prescriptions on File Prior to Visit  Medication Sig Dispense Refill  . aspirin 81 MG tablet Take 81 mg by mouth daily.      Marland Kitchen glipiZIDE (GLUCOTROL) 5 MG tablet Take 20 mg by mouth 2 (two) times daily before a meal.      . levothyroxine (SYNTHROID, LEVOTHROID) 100 MCG tablet Take 100 mcg by mouth daily before breakfast.      . metFORMIN (GLUCOPHAGE) 500 MG tablet Take 2,500 mg by mouth daily with breakfast.       . simvastatin (ZOCOR) 20 MG tablet Take 20 mg by mouth at bedtime.      . sitaGLIPtin (JANUVIA) 100 MG tablet Take 100 mg by mouth daily.       No current facility-administered medications on file prior to visit.    No Known Allergies  Family History  Problem Relation Age of Onset  . Heart disease Father   . Emphysema Father   . Hypertension Father   . Cancer Father     lung/ healthy  . Heart disease Mother     Deceased  . Cancer Mother     breast  . Hypertension Mother   . Cancer Maternal Grandmother     colon  . Diabetes Brother     type 2  . Heart disease Brother     History   Social History  . Marital Status: Married    Spouse Name: N/A    Number of Children: N/A  . Years of Education: N/A   Social History Main Topics  . Smoking status: Former Smoker -- 2.00 packs/day for 30  years    Types: Cigarettes    Start date: 12/27/1983  . Smokeless tobacco: Never Used  . Alcohol Use: Yes     Comment: occasionally  . Drug Use: No  . 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    Other Topics Concern  . None   Social History Narrative  . None   Review of Systems - See HPI.  All other ROS are negative.  BP 118/68  Pulse 65  Temp(Src) 97.8 F (36.6 C) (Oral)  Resp 16  Ht 6' (1.829 m)  Wt 214 lb 4 oz (97.183 kg)  BMI 29.05 kg/m2  SpO2 98%  Physical Exam  Vitals reviewed. Constitutional: He is oriented to person, place, and time and well-developed, well-nourished, and in no distress.  HENT:  Head: Normocephalic and atraumatic.  Right Ear: External ear normal. Tympanic membrane is retracted.  Left Ear: External ear normal. Tympanic membrane is retracted.  Nose: Nose normal.  + fluid noted behind TM bilaterally.  Eyes: Conjunctivae and EOM  are normal. Pupils are equal, round, and reactive to light.  Negative Dix Hallpike Testing  Neck: Neck supple.  Cardiovascular: Normal rate, regular rhythm, normal heart sounds and intact distal pulses.   Pulmonary/Chest: Effort normal and breath sounds normal. No respiratory distress. He has no wheezes. He has no rales. He exhibits no tenderness.  Lymphadenopathy:    He has no cervical adenopathy.  Neurological: He is alert and oriented to person, place, and time.  Skin: Skin is warm and dry. No rash noted.  Psychiatric: Affect normal.    Recent Results (from the past 2160 hour(s))  HEMOGLOBIN A1C     Status: Abnormal   Collection Time    03/20/14  8:22 AM      Result Value Ref Range   Hemoglobin A1C 7.3 (*) <5.7 %   Comment:                                                                            According to the ADA Clinical Practice Recommendations for 2011, when     HbA1c is used as a screening test:             >=6.5%   Diagnostic of Diabetes  Mellitus                (if abnormal result is confirmed)           5.7-6.4%   Increased risk of developing Diabetes Mellitus           References:Diagnosis and Classification of Diabetes Mellitus,Diabetes     NTIR,4431,54(MGQQP 1):S62-S69 and Standards of Medical Care in             Diabetes - 2011,Diabetes Care,2011,34 (Suppl 1):S11-S61.         Mean Plasma Glucose 163 (*) <117 mg/dL  LIPID PANEL     Status: Abnormal   Collection Time    03/20/14  8:22 AM      Result Value Ref Range   Cholesterol 130  0 - 200 mg/dL   Comment: ATP III Classification:           < 200        mg/dL        Desirable          200 - 239     mg/dL        Borderline High          >= 240        mg/dL        High         Triglycerides 220 (*) <150 mg/dL   HDL 37 (*) >39 mg/dL   Total CHOL/HDL Ratio 3.5     VLDL 44 (*) 0 - 40 mg/dL   LDL Cholesterol 49  0 - 99 mg/dL   Comment:       Total Cholesterol/HDL Ratio:CHD Risk                            Coronary Heart Disease Risk Table  Men       Women              1/2 Average Risk              3.4        3.3                  Average Risk              5.0        4.4               2X Average Risk              9.6        7.1               3X Average Risk             23.4       11.0     Use the calculated Patient Ratio above and the CHD Risk table      to determine the patient's CHD Risk.     ATP III Classification (LDL):           < 100        mg/dL         Optimal          100 - 129     mg/dL         Near or Above Optimal          130 - 159     mg/dL         Borderline High          160 - 189     mg/dL         High           > 190        mg/dL         Very High        HEPATIC FUNCTION PANEL     Status: None   Collection Time    03/20/14  8:22 AM      Result Value Ref Range   Total Bilirubin 0.3  0.2 - 1.2 mg/dL   Bilirubin, Direct 0.1  0.0 - 0.3 mg/dL   Indirect Bilirubin 0.2  0.2 - 1.2 mg/dL   Alkaline  Phosphatase 60  39 - 117 U/L   AST 20  0 - 37 U/L   ALT 15  0 - 53 U/L   Total Protein 6.6  6.0 - 8.3 g/dL   Albumin 4.1  3.5 - 5.2 g/dL  RENAL FUNCTION PANEL     Status: Abnormal   Collection Time    03/20/14  8:22 AM      Result Value Ref Range   Sodium 138  135 - 145 mEq/L   Potassium 4.1  3.5 - 5.3 mEq/L   Chloride 106  96 - 112 mEq/L   CO2 25  19 - 32 mEq/L   Glucose, Bld 127 (*) 70 - 99 mg/dL   BUN 14  6 - 23 mg/dL   Creat 1.07  0.50 - 1.35 mg/dL   Albumin 4.1  3.5 - 5.2 g/dL   Calcium 8.8  8.4 - 10.5 mg/dL   Phosphorus 2.1 (*) 2.3 - 4.6 mg/dL  CBC     Status: None   Collection Time    03/20/14  8:22 AM      Result Value Ref  Range   WBC 7.2  4.0 - 10.5 K/uL   RBC 5.26  4.22 - 5.81 MIL/uL   Hemoglobin 14.8  13.0 - 17.0 g/dL   HCT 42.5  39.0 - 52.0 %   MCV 80.8  78.0 - 100.0 fL   MCH 28.1  26.0 - 34.0 pg   MCHC 34.8  30.0 - 36.0 g/dL   RDW 13.9  11.5 - 15.5 %   Platelets 214  150 - 400 K/uL  TSH     Status: None   Collection Time    03/20/14  8:22 AM      Result Value Ref Range   TSH 2.571  0.350 - 4.500 uIU/mL  T4, FREE     Status: None   Collection Time    03/20/14  8:22 AM      Result Value Ref Range   Free T4 1.30  0.80 - 1.80 ng/dL   Assessment/Plan: Vertigo Improved today. Negative Dix-Hallpike Testing.  Fluid behind TM.  Daily claritin.  Daily Flonase.  Limit salt intake.  Follow-up if symptoms aren't continuing to improve.

## 2014-05-06 ENCOUNTER — Encounter: Payer: Self-pay | Admitting: Cardiology

## 2014-05-07 ENCOUNTER — Ambulatory Visit (INDEPENDENT_AMBULATORY_CARE_PROVIDER_SITE_OTHER): Payer: Medicare Other | Admitting: Cardiology

## 2014-05-07 ENCOUNTER — Encounter: Payer: Self-pay | Admitting: Cardiology

## 2014-05-07 ENCOUNTER — Encounter: Payer: Self-pay | Admitting: *Deleted

## 2014-05-07 VITALS — BP 130/70 | HR 59 | Ht 72.0 in | Wt 218.0 lb

## 2014-05-07 DIAGNOSIS — I2510 Atherosclerotic heart disease of native coronary artery without angina pectoris: Secondary | ICD-10-CM | POA: Diagnosis not present

## 2014-05-07 DIAGNOSIS — E785 Hyperlipidemia, unspecified: Secondary | ICD-10-CM | POA: Diagnosis not present

## 2014-05-07 NOTE — Progress Notes (Signed)
HPI: 73 year-old male for evaluation of coronary artery disease. Abdominal CT in September 2012 showed no aneurysm. Carotid Dopplers in August of 2013 showed no significant disease. Patient had PCI in [place] approximately 15 years ago. Records are not available. He recently moved to this area and presents to establish. No dyspnea on exertion, orthopnea, PND, pedal edema, palpitations, syncope or exertional chest pain.  Current Outpatient Prescriptions  Medication Sig Dispense Refill  . aspirin 81 MG tablet Take 81 mg by mouth daily.      Marland Kitchen glipiZIDE (GLUCOTROL) 5 MG tablet Take 20 mg by mouth 2 (two) times daily before a meal.      . levothyroxine (SYNTHROID, LEVOTHROID) 100 MCG tablet Take 100 mcg by mouth daily before breakfast.      . metFORMIN (GLUCOPHAGE) 500 MG tablet Take 2,500 mg by mouth daily with breakfast.       . simvastatin (ZOCOR) 20 MG tablet Take 20 mg by mouth at bedtime.      . sitaGLIPtin (JANUVIA) 100 MG tablet Take 100 mg by mouth daily.       No current facility-administered medications for this visit.    No Known Allergies  Past Medical History  Diagnosis Date  . Hyperlipidemia   . Hypertension   . CAD (coronary artery disease)     2 stents  . Diabetes type 2, controlled   . Chicken pox   . Measles   . Appendicitis   . Mumps as a child  . Melanoma     Scalp. 2012  . Hypothyroidism   . Thyroid disease     Beoming Hyperthyroidism  . Dysphagia, unspecified(787.20) 03/20/2014    Past Surgical History  Procedure Laterality Date  . Appendectomy    . Coronary angioplasty with stent placement      2 stents  . Cardiac catheterization    . Back surgery      x2 lumbar  . Rotator cuff repair Right   . Wisdom tooth extraction    . Tonsillectomy and adenoidectomy    . Skin cancer excision      melanoma on scalp    History   Social History  . Marital Status: Married    Spouse Name: N/A    Number of Children: 2  . Years of Education: N/A    Occupational History  . Not on file.   Social History Main Topics  . Smoking status: Former Smoker -- 2.00 packs/day for 30 years    Types: Cigarettes    Start date: 12/27/1983  . Smokeless tobacco: Never Used  . Alcohol Use: Yes     Comment: occasionally  . Drug Use: No  . 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    Other Topics Concern  . Not on file   Social History Narrative  . No narrative on file    Family History  Problem Relation Age of Onset  . Heart disease Father   . Emphysema Father   . Hypertension Father   . Cancer Father     lung/ healthy  . Heart disease Mother     Deceased  . Cancer Mother     breast  . Hypertension Mother   . Cancer Maternal Grandmother     colon  . Diabetes Brother     type 2  . Heart disease Brother     ROS: no fevers or chills, productive cough, hemoptysis, dysphasia, odynophagia,  melena, hematochezia, dysuria, hematuria, rash, seizure activity, orthopnea, PND, pedal edema, claudication. Remaining systems are negative.  Physical Exam:   Blood pressure 130/70, pulse 59, height 6' (1.829 m), weight 218 lb (98.884 kg).  General:  Well developed/well nourished in NAD Skin warm/dry Patient not depressed No peripheral clubbing Back-normal HEENT-normal/normal eyelids Neck supple/normal carotid upstroke bilaterally; no bruits; no JVD; no thyromegaly chest - CTA/ normal expansion CV - RRR/normal S1 and S2; no murmurs, rubs or gallops;  PMI nondisplaced Abdomen -NT/ND, no HSM, no mass, + bowel sounds, no bruit 2+ femoral pulses, no bruits Ext-no edema, chords, 2+ DP Neuro-grossly nonfocal  ECG Sinus rhythm at a rate of 59. Left axis deviation. RV conduction delay.

## 2014-05-07 NOTE — Assessment & Plan Note (Signed)
Continue aspirin and statin. Schedule nuclear study for risk stratification and quantification of LV function.

## 2014-05-07 NOTE — Assessment & Plan Note (Signed)
Continue statin. Recent LDL outstanding.

## 2014-05-07 NOTE — Patient Instructions (Signed)
Your physician wants you to follow-up in: ONE YEAR WITH DR CRENSHAW You will receive a reminder letter in the mail two months in advance. If you don't receive a letter, please call our office to schedule the follow-up appointment.   Your physician has requested that you have en exercise stress myoview. For further information please visit [email or web address]. Please follow instruction sheet, as given.   

## 2014-05-13 ENCOUNTER — Encounter: Payer: Self-pay | Admitting: Family Medicine

## 2014-05-13 MED ORDER — LEVOTHYROXINE SODIUM 100 MCG PO TABS: 100.0000 ug | ORAL_TABLET | Freq: Every day | ORAL | Status: DC

## 2014-05-13 MED ORDER — SITAGLIPTIN PHOSPHATE 100 MG PO TABS: 100.0000 mg | ORAL_TABLET | Freq: Every day | ORAL | Status: DC

## 2014-05-13 MED ORDER — GLIPIZIDE 5 MG PO TABS: 20.0000 mg | ORAL_TABLET | Freq: Two times a day (BID) | ORAL | Status: DC

## 2014-05-13 MED ORDER — SIMVASTATIN 20 MG PO TABS: 20.0000 mg | ORAL_TABLET | Freq: Every day | ORAL | Status: DC

## 2014-05-13 MED ORDER — METFORMIN HCL 500 MG PO TABS: 2500.0000 mg | ORAL_TABLET | Freq: Every day | ORAL | Status: DC

## 2014-05-21 ENCOUNTER — Encounter (HOSPITAL_COMMUNITY): Payer: Medicare Other

## 2014-05-22 ENCOUNTER — Ambulatory Visit (HOSPITAL_COMMUNITY): Payer: Medicare Other | Attending: Cardiovascular Disease | Admitting: Radiology

## 2014-05-22 VITALS — BP 128/77 | HR 55 | Ht 72.0 in | Wt 212.0 lb

## 2014-05-22 DIAGNOSIS — I2510 Atherosclerotic heart disease of native coronary artery without angina pectoris: Secondary | ICD-10-CM | POA: Insufficient documentation

## 2014-05-22 DIAGNOSIS — Z9861 Coronary angioplasty status: Secondary | ICD-10-CM | POA: Diagnosis not present

## 2014-05-22 DIAGNOSIS — E119 Type 2 diabetes mellitus without complications: Secondary | ICD-10-CM | POA: Diagnosis not present

## 2014-05-22 MED ORDER — TECHNETIUM TC 99M SESTAMIBI GENERIC - CARDIOLITE
11.0000 | Freq: Once | INTRAVENOUS | Status: AC | PRN
  Administered 2014-05-22: 11 via INTRAVENOUS

## 2014-05-22 MED ORDER — TECHNETIUM TC 99M SESTAMIBI GENERIC - CARDIOLITE
33.0000 | Freq: Once | INTRAVENOUS | Status: AC | PRN
  Administered 2014-05-22: 33 via INTRAVENOUS

## 2014-05-22 NOTE — Progress Notes (Signed)
[facility] SITE [address] NUCLEAR MED [place], [place] [address] [phone]    Cardiology Nuclear Med Study  Jack Wade is a 73 y.o. male     MRN : [id number]     DOB: [birth year]  Procedure Date: 05/22/2014  Nuclear Med Background Indication for Stress Test:  Evaluation for Ischemia and Stent Patency History:  CAD, Cath, Stent, PTCA ~15 yrs ago in [place], [place] (ok per pt.), MPI ~3 yrs ago (ok per pt.) Cardiac Risk Factors: Family History - CAD, History of Smoking, Hypertension, Lipids and NIDDM  Symptoms:  None indicated   Nuclear Pre-Procedure Caffeine/Decaff Intake:  None> 12 hrs NPO After: 7:00pm   Lungs:  clear O2 Sat: 92% on room air. IV 0.9% NS with Angio Cath:  22g  IV Site: R Antecubital x 1, tolerated well IV Started by:  Irven Baltimore, RN  Chest Size (in):  44 Cup Size: n/a  Height: 6' (1.829 m)  Weight:  212 lb (96.163 kg)  BMI:  Body mass index is 28.75 kg/(m^2). Tech Comments:  No Medications (Glucotrol, Glucophage, and Januvia) this am. Irven Baltimore, RN.    Nuclear Med Study 1 or 2 day study: 1 day  Stress Test Type:  Stress  Reading MD: N/A  Order Authorizing Provider:  Kirk Ruths, MD  Resting Radionuclide: Technetium 56m Sestamibi  Resting Radionuclide Dose: 11.0 mCi   Stress Radionuclide:  Technetium 83m Sestamibi  Stress Radionuclide Dose: 33.0 mCi           Stress Protocol Rest HR: 55 Stress HR: 134  Rest BP: 128/77 Stress BP: 169/73  Exercise Time (min): 7:00 METS: 8.4           Dose of Adenosine (mg):  n/a Dose of Lexiscan: n/a mg  Dose of Atropine (mg): n/a Dose of Dobutamine: n/a mcg/kg/min (at max HR)  Stress Test Technologist: Glade Lloyd, BS-ES  Nuclear Technologist:  Charlton Amor, CNMT     Rest Procedure:  Myocardial perfusion imaging was performed at rest 45 minutes following the intravenous administration of Technetium 46m Sestamibi. Rest ECG: Sinus bradycardia with rSR' and occasional  PVC's  Stress Procedure:  The patient exercised on the treadmill utilizing the Bruce Protocol for 7:00 minutes. The patient stopped due to fatigue and denied any chest pain.  Technetium 47m Sestamibi was injected at peak exercise and myocardial perfusion imaging was performed after a brief delay. Stress ECG: There was <50mm of horizontal ST segment depression in the inferolateral leads at peak stress that became downsloping in recovery  QPS Raw Data Images:  Mild diaphragmatic attenuation.  Normal left ventricular size. Stress Images:  There is decreased uptake in the lateral wall. Rest Images:  There is decreased uptake in the lateral wall. Subtraction (SDS):  No evidence of ischemia. Transient Ischemic Dilatation (Normal <1.22):  1.01 Lung/Heart Ratio (Normal <0.45):  0.33  Quantitative Gated Spect Images QGS EDV:  92 ml QGS ESV:  36 ml  Impression Exercise Capacity:  Fair exercise capacity. BP Response:  Normal blood pressure response. Clinical Symptoms:  No significant symptoms noted. ECG Impression:  Significant ST abnormalities consistent with ischemia. Comparison with Prior Nuclear Study: No images to compare  Overall Impression:  Intermediate risk stress nuclear study with a fixed defect in the basal and mid lateral wall with no ischemia.  The patient had signficant ST changes at peak exercise and in recovery consisting of <51mm of horizontal ST segment depression which then became downsloping in recovery.Marland Kitchen  LV Ejection Fraction: 61%.  LV Wall Motion:  NL LV Function; NL Wall Motion  Signed: Fransico Him, MD [facility]

## 2014-05-23 ENCOUNTER — Encounter: Payer: Self-pay | Admitting: Cardiology

## 2014-05-23 NOTE — Telephone Encounter (Signed)
New message    Wife has a questions regarding test results.

## 2014-05-23 NOTE — Telephone Encounter (Signed)
This encounter was created in error - please disregard.

## 2014-05-23 NOTE — Telephone Encounter (Signed)
New message ° ° ° °Returned Debra's call °

## 2014-06-25 ENCOUNTER — Encounter: Payer: Self-pay | Admitting: Physician Assistant

## 2014-06-25 ENCOUNTER — Ambulatory Visit (INDEPENDENT_AMBULATORY_CARE_PROVIDER_SITE_OTHER): Payer: Medicare Other | Admitting: Physician Assistant

## 2014-06-25 VITALS — BP 116/68 | HR 72 | Temp 98.2°F | Resp 16 | Ht 72.0 in | Wt 214.8 lb

## 2014-06-25 DIAGNOSIS — I2510 Atherosclerotic heart disease of native coronary artery without angina pectoris: Secondary | ICD-10-CM | POA: Diagnosis not present

## 2014-06-25 DIAGNOSIS — B9689 Other specified bacterial agents as the cause of diseases classified elsewhere: Secondary | ICD-10-CM

## 2014-06-25 DIAGNOSIS — J019 Acute sinusitis, unspecified: Secondary | ICD-10-CM | POA: Diagnosis not present

## 2014-06-25 MED ORDER — AZITHROMYCIN 250 MG PO TABS: ORAL_TABLET | ORAL | Status: DC

## 2014-06-25 NOTE — Patient Instructions (Signed)
Please take antibiotic as directed.  Increase fluid intake.  Use Saline nasal spray.  Take a daily multivitamin.  Place a humidifier in the bedroom.  Please call or return clinic if symptoms are not improving.  Sinusitis Sinusitis is redness, soreness, and swelling (inflammation) of the paranasal sinuses. Paranasal sinuses are air pockets within the bones of your face (beneath the eyes, the middle of the forehead, or above the eyes). In healthy paranasal sinuses, mucus is able to drain out, and air is able to circulate through them by way of your nose. However, when your paranasal sinuses are inflamed, mucus and air can become trapped. This can allow bacteria and other germs to grow and cause infection. Sinusitis can develop quickly and last only a short time (acute) or continue over a long period (chronic). Sinusitis that lasts for more than 12 weeks is considered chronic.  CAUSES  Causes of sinusitis include:  Allergies.  Structural abnormalities, such as displacement of the cartilage that separates your nostrils (deviated septum), which can decrease the air flow through your nose and sinuses and affect sinus drainage.  Functional abnormalities, such as when the small hairs (cilia) that line your sinuses and help remove mucus do not work properly or are not present. SYMPTOMS  Symptoms of acute and chronic sinusitis are the same. The primary symptoms are pain and pressure around the affected sinuses. Other symptoms include:  Upper toothache.  Earache.  Headache.  Bad breath.  Decreased sense of smell and taste.  A cough, which worsens when you are lying flat.  Fatigue.  Fever.  Thick drainage from your nose, which often is green and may contain pus (purulent).  Swelling and warmth over the affected sinuses. DIAGNOSIS  Your caregiver will perform a physical exam. During the exam, your caregiver may:  Look in your nose for signs of abnormal growths in your nostrils (nasal  polyps).  Tap over the affected sinus to check for signs of infection.  View the inside of your sinuses (endoscopy) with a special imaging device with a light attached (endoscope), which is inserted into your sinuses. If your caregiver suspects that you have chronic sinusitis, one or more of the following tests may be recommended:  Allergy tests.  Nasal culture A sample of mucus is taken from your nose and sent to a lab and screened for bacteria.  Nasal cytology A sample of mucus is taken from your nose and examined by your caregiver to determine if your sinusitis is related to an allergy. TREATMENT  Most cases of acute sinusitis are related to a viral infection and will resolve on their own within 10 days. Sometimes medicines are prescribed to help relieve symptoms (pain medicine, decongestants, nasal steroid sprays, or saline sprays).  However, for sinusitis related to a bacterial infection, your caregiver will prescribe antibiotic medicines. These are medicines that will help kill the bacteria causing the infection.  Rarely, sinusitis is caused by a fungal infection. In theses cases, your caregiver will prescribe antifungal medicine. For some cases of chronic sinusitis, surgery is needed. Generally, these are cases in which sinusitis recurs more than 3 times per year, despite other treatments. HOME CARE INSTRUCTIONS   Drink plenty of water. Water helps thin the mucus so your sinuses can drain more easily.  Use a humidifier.  Inhale steam 3 to 4 times a day (for example, sit in the bathroom with the shower running).  Apply a warm, moist washcloth to your face 3 to 4 times a day,  or as directed by your caregiver.  Use saline nasal sprays to help moisten and clean your sinuses.  Take over-the-counter or prescription medicines for pain, discomfort, or fever only as directed by your caregiver. SEEK IMMEDIATE MEDICAL CARE IF:  You have increasing pain or severe headaches.  You have  nausea, vomiting, or drowsiness.  You have swelling around your face.  You have vision problems.  You have a stiff neck.  You have difficulty breathing. MAKE SURE YOU:   Understand these instructions.  Will watch your condition.  Will get help right away if you are not doing well or get worse. Document Released: 12/12/2005 Document Revised: 03/05/2012 Document Reviewed: 12/27/2011 [facility] Patient Information 2014 [place], [place].

## 2014-06-25 NOTE — Progress Notes (Signed)
Patient presents to clinic today c/o sinus pressure, sinus pain, thick green nasal discharge and PND.  Patient endorses headaches.  Patient denies fever, ear pain, tooth pain, cough or SOB.  Denies recent travel or sick contact.    Past Medical History  Diagnosis Date  . Hyperlipidemia   . Hypertension   . CAD (coronary artery disease)     2 stents  . Diabetes type 2, controlled   . Chicken pox   . Measles   . Appendicitis   . Mumps as a child  . Melanoma     Scalp. 2012  . Hypothyroidism   . Thyroid disease     Beoming Hyperthyroidism  . Dysphagia, unspecified(787.20) 03/20/2014    Current Outpatient Prescriptions on File Prior to Visit  Medication Sig Dispense Refill  . aspirin 81 MG tablet Take 81 mg by mouth daily.      Marland Kitchen glipiZIDE (GLUCOTROL) 5 MG tablet Take 4 tablets (20 mg total) by mouth 2 (two) times daily before a meal.  720 tablet  1  . levothyroxine (SYNTHROID, LEVOTHROID) 100 MCG tablet Take 1 tablet (100 mcg total) by mouth daily before breakfast.  90 tablet  1  . metFORMIN (GLUCOPHAGE) 500 MG tablet Take 5 tablets (2,500 mg total) by mouth daily with breakfast.  450 tablet  1  . simvastatin (ZOCOR) 20 MG tablet Take 1 tablet (20 mg total) by mouth at bedtime.  90 tablet  1  . sitaGLIPtin (JANUVIA) 100 MG tablet Take 1 tablet (100 mg total) by mouth daily.  90 tablet  1   No current facility-administered medications on file prior to visit.    No Known Allergies  Family History  Problem Relation Age of Onset  . Heart disease Father   . Emphysema Father   . Hypertension Father   . Cancer Father     lung/ healthy  . Heart disease Mother     Deceased  . Cancer Mother     breast  . Hypertension Mother   . Cancer Maternal Grandmother     colon  . Diabetes Brother     type 2  . Heart disease Brother     History   Social History  . Marital Status: Married    Spouse Name: N/A    Number of Children: 2  . Years of Education: N/A   Social History Main  Topics  . Smoking status: Former Smoker -- 2.00 packs/day for 30 years    Types: Cigarettes    Start date: 12/27/1983  . Smokeless tobacco: Never Used  . Alcohol Use: Yes     Comment: occasionally  . Drug Use: No  . 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    Other Topics Concern  . None   Social History Narrative  . None   Review of Systems - See HPI.  All other ROS are negative.  BP 116/68  Pulse 72  Temp(Src) 98.2 F (36.8 C) (Oral)  Resp 16  Ht 6' (1.829 m)  Wt 214 lb 12 oz (97.41 kg)  BMI 29.12 kg/m2  SpO2 98%  Physical Exam  Vitals reviewed. Constitutional: He is oriented to person, place, and time and well-developed, well-nourished, and in no distress.  HENT:  Head: Normocephalic and atraumatic.  Right Ear: External ear normal.  Left Ear: External ear normal.  Nose: Nose normal.  Mouth/Throat: Oropharynx is clear and moist. No oropharyngeal exudate.  TM within normal  limits bilaterally. + TTP of sinuses on exam.  Eyes: Conjunctivae and EOM are normal. Pupils are equal, round, and reactive to light.  Neck: Neck supple.  Cardiovascular: Normal rate, regular rhythm, normal heart sounds and intact distal pulses.   Pulmonary/Chest: Effort normal and breath sounds normal. No respiratory distress. He has no wheezes. He has no rales. He exhibits no tenderness.  Lymphadenopathy:    He has no cervical adenopathy.  Neurological: He is alert and oriented to person, place, and time.  Skin: Skin is warm and dry. No rash noted.  Psychiatric: Affect normal.   Assessment/Plan: Acute bacterial sinusitis Rx Azithromycin.  Increase fluids.  Rest.  Saline nasal spray. Humidifier in bedroom. OTC Sinus medication.  Return precautions discussed with patient.

## 2014-06-25 NOTE — Progress Notes (Signed)
Pre visit review using our clinic review tool, if applicable. No additional management support is needed unless otherwise documented below in the visit note/SLS  

## 2014-06-25 NOTE — Assessment & Plan Note (Signed)
Rx Azithromycin.  Increase fluids.  Rest.  Saline nasal spray. Humidifier in bedroom. OTC Sinus medication.  Return precautions discussed with patient.

## 2014-07-04 DIAGNOSIS — E119 Type 2 diabetes mellitus without complications: Secondary | ICD-10-CM | POA: Diagnosis not present

## 2014-07-17 ENCOUNTER — Ambulatory Visit (INDEPENDENT_AMBULATORY_CARE_PROVIDER_SITE_OTHER): Payer: Medicare Other | Admitting: Family Medicine

## 2014-07-17 ENCOUNTER — Encounter: Payer: Self-pay | Admitting: Family Medicine

## 2014-07-17 VITALS — BP 110/66 | HR 64 | Temp 97.8°F | Resp 16 | Ht 72.0 in | Wt 213.2 lb

## 2014-07-17 DIAGNOSIS — I2510 Atherosclerotic heart disease of native coronary artery without angina pectoris: Secondary | ICD-10-CM | POA: Diagnosis not present

## 2014-07-17 DIAGNOSIS — I10 Essential (primary) hypertension: Secondary | ICD-10-CM

## 2014-07-17 DIAGNOSIS — E1159 Type 2 diabetes mellitus with other circulatory complications: Secondary | ICD-10-CM

## 2014-07-17 DIAGNOSIS — E119 Type 2 diabetes mellitus without complications: Secondary | ICD-10-CM

## 2014-07-17 DIAGNOSIS — J019 Acute sinusitis, unspecified: Secondary | ICD-10-CM

## 2014-07-17 DIAGNOSIS — E785 Hyperlipidemia, unspecified: Secondary | ICD-10-CM | POA: Diagnosis not present

## 2014-07-17 DIAGNOSIS — R1310 Dysphagia, unspecified: Secondary | ICD-10-CM

## 2014-07-17 DIAGNOSIS — B9689 Other specified bacterial agents as the cause of diseases classified elsewhere: Secondary | ICD-10-CM

## 2014-07-17 LAB — RENAL FUNCTION PANEL
Albumin: 4.4 g/dL (ref 3.5–5.2)
BUN: 16 mg/dL (ref 6–23)
CO2: 26 mEq/L (ref 19–32)
Calcium: 9.7 mg/dL (ref 8.4–10.5)
Chloride: 103 mEq/L (ref 96–112)
Creat: 1.16 mg/dL (ref 0.50–1.35)
Glucose, Bld: 126 mg/dL — ABNORMAL HIGH (ref 70–99)
Phosphorus: 3.5 mg/dL (ref 2.3–4.6)
Potassium: 4.4 mEq/L (ref 3.5–5.3)
Sodium: 138 mEq/L (ref 135–145)

## 2014-07-17 LAB — CBC
HCT: 42.4 % (ref 39.0–52.0)
Hemoglobin: 14.3 g/dL (ref 13.0–17.0)
MCH: 27.8 pg (ref 26.0–34.0)
MCHC: 33.7 g/dL (ref 30.0–36.0)
MCV: 82.3 fL (ref 78.0–100.0)
Platelets: 235 10*3/uL (ref 150–400)
RBC: 5.15 MIL/uL (ref 4.22–5.81)
RDW: 14.1 % (ref 11.5–15.5)
WBC: 8.2 10*3/uL (ref 4.0–10.5)

## 2014-07-17 LAB — HEPATIC FUNCTION PANEL
ALT: 13 U/L (ref 0–53)
AST: 19 U/L (ref 0–37)
Albumin: 4.4 g/dL (ref 3.5–5.2)
Alkaline Phosphatase: 63 U/L (ref 39–117)
Bilirubin, Direct: 0.1 mg/dL (ref 0.0–0.3)
Indirect Bilirubin: 0.6 mg/dL (ref 0.2–1.2)
Total Bilirubin: 0.7 mg/dL (ref 0.2–1.2)
Total Protein: 7.2 g/dL (ref 6.0–8.3)

## 2014-07-17 LAB — LIPID PANEL
Cholesterol: 114 mg/dL (ref 0–200)
HDL: 38 mg/dL — ABNORMAL LOW (ref >39)
LDL Cholesterol: 40 mg/dL (ref 0–99)
Total CHOL/HDL Ratio: 3 Ratio
Triglycerides: 179 mg/dL — ABNORMAL HIGH (ref <150)
VLDL: 36 mg/dL (ref 0–40)

## 2014-07-17 LAB — HEMOGLOBIN A1C
Hgb A1c MFr Bld: 6.8 % — ABNORMAL HIGH (ref <5.7)
Mean Plasma Glucose: 148 mg/dL — ABNORMAL HIGH (ref <117)

## 2014-07-17 MED ORDER — METFORMIN HCL ER 500 MG PO TB24: 500.0000 mg | ORAL_TABLET | Freq: Every day | ORAL | Status: DC

## 2014-07-17 NOTE — Assessment & Plan Note (Signed)
Well controlled, no changes to meds. Encouraged heart healthy diet such as the DASH diet and exercise as tolerated.  °

## 2014-07-17 NOTE — Progress Notes (Signed)
Pre visit review using our clinic review tool, if applicable. No additional management support is needed unless otherwise documented below in the visit note/SLS  

## 2014-07-17 NOTE — Assessment & Plan Note (Signed)
Well treated no need for new meds

## 2014-07-17 NOTE — Assessment & Plan Note (Signed)
Stable has established with cardiology

## 2014-07-17 NOTE — Patient Instructions (Addendum)
Consider a daily probiotic such as Digestive Advantage or [facility] If sore throat nausea persist after 2 weeks of changes then start Zantac at bedtime, call if no improvement  Basic Carbohydrate Counting for Diabetes Mellitus Carbohydrate counting is a method for keeping track of the amount of carbohydrates you eat. Eating carbohydrates naturally increases the level of sugar (glucose) in your blood, so it is important for you to know the amount that is okay for you to have in every meal. Carbohydrate counting helps keep the level of glucose in your blood within normal limits. The amount of carbohydrates allowed is different for every person. A dietitian can help you calculate the amount that is right for you. Once you know the amount of carbohydrates you can have, you can count the carbohydrates in the foods you want to eat. Carbohydrates are found in the following foods:  Grains, such as breads and cereals.  Dried beans and soy products.  Starchy vegetables, such as potatoes, peas, and corn.  Fruit and fruit juices.  Milk and yogurt.  Sweets and snack foods, such as cake, cookies, candy, chips, soft drinks, and fruit drinks. CARBOHYDRATE COUNTING There are two ways to count the carbohydrates in your food. You can use either of the methods or a combination of both. Reading the "Nutrition Facts" on [place] The "Nutrition Facts" is an area that is included on the labels of almost all packaged food and beverages in the [place]. It includes the serving size of that food or beverage and information about the nutrients in each serving of the food, including the grams (g) of carbohydrate per serving.  Decide the number of servings of this food or beverage that you will be able to eat or drink. Multiply that number of servings by the number of grams of carbohydrate that is listed on the label for that serving. The total will be the amount of carbohydrates you will be having  when you eat or drink this food or beverage. Learning Standard Serving Sizes of Food When you eat food that is not packaged or does not include "Nutrition Facts" on the label, you need to measure the servings in order to count the amount of carbohydrates.A serving of most carbohydrate-rich foods contains about 15 g of carbohydrates. The following list includes serving sizes of carbohydrate-rich foods that provide 15 g ofcarbohydrate per serving:   1 slice of bread (1 oz) or 1 six-inch tortilla.    of a hamburger bun or English muffin.  4-6 crackers.   cup unsweetened dry cereal.    cup hot cereal.   cup rice or pasta.    cup mashed potatoes or  of a large baked potato.  1 cup fresh fruit or one small piece of fruit.    cup canned or frozen fruit or fruit juice.  1 cup milk.   cup plain fat-free yogurt or yogurt sweetened with artificial sweeteners.   cup cooked dried beans or starchy vegetable, such as peas, corn, or potatoes.  Decide the number of standard-size servings that you will eat. Multiply that number of servings by 15 (the grams of carbohydrates in that serving). For example, if you eat 2 cups of strawberries, you will have eaten 2 servings and 30 g of carbohydrates (2 servings x 15 g = 30 g). For foods such as soups and casseroles, in which more than one food is mixed in, you will need to count the carbohydrates in each food that is included.  EXAMPLE OF CARBOHYDRATE COUNTING Sample Dinner  3 oz chicken breast.   cup of brown rice.   cup of corn.  1 cup milk.   1 cup strawberries with sugar-free whipped topping.  Carbohydrate Calculation Step 1: Identify the foods that contain carbohydrates:   Rice.   Corn.   Milk.   Strawberries. Step 2:Calculate the number of servings eaten of each:   2 servings of rice.   1 serving of corn.   1 serving of milk.   1 serving of strawberries. Step 3: Multiply each of those number of  servings by 15 g:   2 servings of rice x 15 g = 30 g.   1 serving of corn x 15 g = 15 g.   1 serving of milk x 15 g = 15 g.   1 serving of strawberries x 15 g = 15 g. Step 4: Add together all of the amounts to find the total grams of carbohydrates eaten: 30 g + 15 g + 15 g + 15 g = 75 g. Document Released: 12/12/2005 Document Revised: 04/28/2014 Document Reviewed: 11/08/2013 [facility] Patient Information 2015 [place], [place]. This information is not intended to replace advice given to you by your health care provider. Make sure you discuss any questions you have with your health care provider.

## 2014-07-17 NOTE — Progress Notes (Signed)
Patient ID: Jack Wade, male   DOB: Feb 22, 1941, 73 y.o.   MRN: [id number] Jack Wade [id number] 01-24-1941 07/17/2014      Progress Note-Follow Up  Subjective  Chief Complaint  Chief Complaint  Patient presents with  . Follow-up    4-Mth. [DM; HTN; CAD; Hyperlipid]    HPI  Patient is a 73 year old male in today for routine medical care. In today for followup of multiple medical problems. Doing fairly well. Recently been seen by his ophthalmologist and no new findings were noted. No recent visual changes. Is noting since having a sinusitis treated recently he's had some mild increase in sore throat and a funny feeling in his throat. No fevers. Has some mild nausea only in a.m. Never any vomiting. No trauma No significant nasal congestion, headache. Denies CP/palp/SOB/HA/congestion/fevers/GI or GU c/o. Taking meds as prescribed  Past Medical History  Diagnosis Date  . Hyperlipidemia   . Hypertension   . CAD (coronary artery disease)     2 stents  . Diabetes type 2, controlled   . Chicken pox   . Measles   . Appendicitis   . Mumps as a child  . Melanoma     Scalp. 2012  . Hypothyroidism   . Thyroid disease     Beoming Hyperthyroidism  . Dysphagia, unspecified(787.20) 03/20/2014    Past Surgical History  Procedure Laterality Date  . Appendectomy    . Coronary angioplasty with stent placement      2 stents  . Cardiac catheterization    . Back surgery      x2 lumbar  . Rotator cuff repair Right   . Wisdom tooth extraction    . Tonsillectomy and adenoidectomy    . Skin cancer excision      melanoma on scalp    Family History  Problem Relation Age of Onset  . Heart disease Father   . Emphysema Father   . Hypertension Father   . Cancer Father     lung/ healthy  . Heart disease Mother     Deceased  . Cancer Mother     breast  . Hypertension Mother   . Cancer Maternal Grandmother     colon  . Diabetes Brother     type 2  . Heart disease Brother      History   Social History  . Marital Status: Married    Spouse Name: N/A    Number of Children: 2  . Years of Education: N/A   Occupational History  . Not on file.   Social History Main Topics  . Smoking status: Former Smoker -- 2.00 packs/day for 30 years    Types: Cigarettes    Start date: 12/27/1983  . Smokeless tobacco: Never Used  . Alcohol Use: Yes     Comment: occasionally  . Drug Use: No  . 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    Other Topics Concern  . Not on file   Social History Narrative  . No narrative on file    Current Outpatient Prescriptions on File Prior to Visit  Medication Sig Dispense Refill  . aspirin 81 MG tablet Take 81 mg by mouth daily.      Marland Kitchen glipiZIDE (GLUCOTROL) 5 MG tablet Take 4 tablets (20 mg total) by mouth 2 (two) times daily before a meal.  720 tablet  1  . levothyroxine (SYNTHROID, LEVOTHROID) 100 MCG tablet Take 1 tablet (  100 mcg total) by mouth daily before breakfast.  90 tablet  1  . metFORMIN (GLUCOPHAGE) 500 MG tablet Take 5 tablets (2,500 mg total) by mouth daily with breakfast.  450 tablet  1  . simvastatin (ZOCOR) 20 MG tablet Take 1 tablet (20 mg total) by mouth at bedtime.  90 tablet  1  . sitaGLIPtin (JANUVIA) 100 MG tablet Take 1 tablet (100 mg total) by mouth daily.  90 tablet  1   No current facility-administered medications on file prior to visit.    No Known Allergies  Review of Systems  Review of Systems  Constitutional: Negative for fever and malaise/fatigue.  HENT: Positive for congestion and sore throat.   Eyes: Negative for discharge.  Respiratory: Negative for shortness of breath.   Cardiovascular: Negative for chest pain, palpitations and leg swelling.  Gastrointestinal: Positive for nausea. Negative for abdominal pain and diarrhea.  Genitourinary: Negative for dysuria.  Musculoskeletal: Negative for falls.  Skin: Negative for rash.   Neurological: Negative for loss of consciousness and headaches.  Endo/Heme/Allergies: Negative for polydipsia.  Psychiatric/Behavioral: Negative for depression and suicidal ideas. The patient is not nervous/anxious and does not have insomnia.     Objective  BP 110/66  Pulse 64  Temp(Src) 97.8 F (36.6 C) (Oral)  Resp 16  Ht 6' (1.829 m)  Wt 213 lb 4 oz (96.73 kg)  BMI 28.92 kg/m2  SpO2 98%  Physical Exam Physical Exam  Constitutional: He is oriented to person, place, and time and well-developed, well-nourished, and in no distress. No distress.  HENT:  Head: Normocephalic and atraumatic.  Eyes: Conjunctivae are normal.  Neck: Neck supple. No thyromegaly present.  Cardiovascular: Normal rate, regular rhythm and normal heart sounds.   No murmur heard. Pulmonary/Chest: Effort normal and breath sounds normal. No respiratory distress.  Abdominal: He exhibits no distension and no mass. There is no tenderness.  Musculoskeletal: He exhibits no edema.  Neurological: He is alert and oriented to person, place, and time.  Skin: Skin is warm.  Psychiatric: Memory, affect and judgment normal.    Lab Results  Component Value Date   TSH 2.571 03/20/2014   Lab Results  Component Value Date   WBC 7.2 03/20/2014   HGB 14.8 03/20/2014   HCT 42.5 03/20/2014   MCV 80.8 03/20/2014   PLT 214 03/20/2014   Lab Results  Component Value Date   CREATININE 1.07 03/20/2014   BUN 14 03/20/2014   NA 138 03/20/2014   K 4.1 03/20/2014   CL 106 03/20/2014   CO2 25 03/20/2014   Lab Results  Component Value Date   ALT 15 03/20/2014   AST 20 03/20/2014   ALKPHOS 60 03/20/2014   BILITOT 0.3 03/20/2014   Lab Results  Component Value Date   CHOL 130 03/20/2014   Lab Results  Component Value Date   HDL 37* 03/20/2014   Lab Results  Component Value Date   LDLCALC 49 03/20/2014   Lab Results  Component Value Date   TRIG 220* 03/20/2014   Lab Results  Component Value Date   CHOLHDL 3.5 03/20/2014      Assessment & Plan   HTN (hypertension) Well controlled, no changes to meds. Encouraged heart healthy diet such as the DASH diet and exercise as tolerated.   CAD (coronary artery disease) Stable has established with cardiology  Acute bacterial sinusitis Well treated no need for new meds  Diabetes hgba1c acceptable, minimize simple carbs. Increase exercise as tolerated. Continue current meds. Follows  with Opthamology Dr Baker Janus  Hyperlipidemia Tolerating statin, encouraged heart healthy diet, avoid trans fats, minimize simple carbs and saturated fats. Increase exercise as tolerated  Dysphagia, unspecified(787.20) With some nausea, globus and sore throat in am after recent antibiotic use, add probiotic, Avoid offending foods, start probiotics. Do not eat large meals in late evening and consider raising head of bed. Consider Zantac and call if no improvement

## 2014-07-18 LAB — TSH: TSH: 4.225 u[IU]/mL (ref 0.350–4.500)

## 2014-07-18 NOTE — Assessment & Plan Note (Signed)
Tolerating statin, encouraged heart healthy diet, avoid trans fats, minimize simple carbs and saturated fats. Increase exercise as tolerated 

## 2014-07-18 NOTE — Assessment & Plan Note (Signed)
With some nausea, globus and sore throat in am after recent antibiotic use, add probiotic, Avoid offending foods, start probiotics. Do not eat large meals in late evening and consider raising head of bed. Consider Zantac and call if no improvement

## 2014-07-18 NOTE — Assessment & Plan Note (Addendum)
hgba1c acceptable, minimize simple carbs. Increase exercise as tolerated. Continue current meds. Follows with Opthamology Dr Baker Janus

## 2014-07-23 DIAGNOSIS — Z85820 Personal history of malignant melanoma of skin: Secondary | ICD-10-CM | POA: Diagnosis not present

## 2014-07-23 DIAGNOSIS — L570 Actinic keratosis: Secondary | ICD-10-CM | POA: Diagnosis not present

## 2014-11-10 ENCOUNTER — Ambulatory Visit (INDEPENDENT_AMBULATORY_CARE_PROVIDER_SITE_OTHER): Payer: Medicare Other | Admitting: Family Medicine

## 2014-11-10 ENCOUNTER — Ambulatory Visit (INDEPENDENT_AMBULATORY_CARE_PROVIDER_SITE_OTHER): Payer: Medicare Other

## 2014-11-10 ENCOUNTER — Encounter: Payer: Self-pay | Admitting: Family Medicine

## 2014-11-10 VITALS — BP 120/52 | HR 60 | Temp 97.8°F | Ht 72.0 in | Wt 215.8 lb

## 2014-11-10 DIAGNOSIS — I2510 Atherosclerotic heart disease of native coronary artery without angina pectoris: Secondary | ICD-10-CM

## 2014-11-10 DIAGNOSIS — E039 Hypothyroidism, unspecified: Secondary | ICD-10-CM

## 2014-11-10 DIAGNOSIS — E785 Hyperlipidemia, unspecified: Secondary | ICD-10-CM | POA: Diagnosis not present

## 2014-11-10 DIAGNOSIS — Z23 Encounter for immunization: Secondary | ICD-10-CM | POA: Diagnosis not present

## 2014-11-10 DIAGNOSIS — Z87891 Personal history of nicotine dependence: Secondary | ICD-10-CM

## 2014-11-10 DIAGNOSIS — E119 Type 2 diabetes mellitus without complications: Secondary | ICD-10-CM | POA: Diagnosis not present

## 2014-11-10 DIAGNOSIS — I10 Essential (primary) hypertension: Secondary | ICD-10-CM | POA: Diagnosis not present

## 2014-11-10 LAB — HEPATIC FUNCTION PANEL
ALT: 19 U/L (ref 0–53)
AST: 22 U/L (ref 0–37)
Albumin: 4.1 g/dL (ref 3.5–5.2)
Alkaline Phosphatase: 61 U/L (ref 39–117)
Bilirubin, Direct: 0 mg/dL (ref 0.0–0.3)
Total Bilirubin: 0.5 mg/dL (ref 0.2–1.2)
Total Protein: 7 g/dL (ref 6.0–8.3)

## 2014-11-10 LAB — RENAL FUNCTION PANEL
Albumin: 4.1 g/dL (ref 3.5–5.2)
BUN: 13 mg/dL (ref 6–23)
CO2: 26 meq/L (ref 19–32)
Calcium: 9.6 mg/dL (ref 8.4–10.5)
Chloride: 106 meq/L (ref 96–112)
Creatinine, Ser: 1.2 mg/dL (ref 0.4–1.5)
GFR: 65.14 mL/min
Glucose, Bld: 195 mg/dL — ABNORMAL HIGH (ref 70–99)
Phosphorus: 3.2 mg/dL (ref 2.3–4.6)
Potassium: 4.9 meq/L (ref 3.5–5.1)
Sodium: 139 meq/L (ref 135–145)

## 2014-11-10 LAB — TSH: TSH: 1.59 u[IU]/mL (ref 0.35–4.50)

## 2014-11-10 LAB — CBC
HCT: 43.4 % (ref 39.0–52.0)
Hemoglobin: 14.3 g/dL (ref 13.0–17.0)
MCHC: 32.9 g/dL (ref 30.0–36.0)
MCV: 83.3 fl (ref 78.0–100.0)
Platelets: 223 10*3/uL (ref 150.0–400.0)
RBC: 5.21 Mil/uL (ref 4.22–5.81)
RDW: 13.8 % (ref 11.5–15.5)
WBC: 6.6 10*3/uL (ref 4.0–10.5)

## 2014-11-10 LAB — HEMOGLOBIN A1C: Hgb A1c MFr Bld: 6.9 % — ABNORMAL HIGH (ref 4.6–6.5)

## 2014-11-10 LAB — LIPID PANEL
Cholesterol: 152 mg/dL (ref 0–200)
HDL: 33.5 mg/dL — ABNORMAL LOW (ref >39.00)
NonHDL: 118.5
Total CHOL/HDL Ratio: 5
Triglycerides: 288 mg/dL — ABNORMAL HIGH (ref 0.0–149.0)
VLDL: 57.6 mg/dL — ABNORMAL HIGH (ref 0.0–40.0)

## 2014-11-10 MED ORDER — METFORMIN HCL ER 500 MG PO TB24: 1000.0000 mg | ORAL_TABLET | Freq: Two times a day (BID) | ORAL | Status: DC

## 2014-11-10 NOTE — Patient Instructions (Signed)

## 2014-11-10 NOTE — Progress Notes (Signed)
Pre visit review using our clinic review tool, if applicable. No additional management support is needed unless otherwise documented below in the visit note. 

## 2014-11-11 LAB — LDL CHOLESTEROL, DIRECT: Direct LDL: 78.1 mg/dL

## 2014-11-13 MED ORDER — SIMVASTATIN 40 MG PO TABS: 40.0000 mg | ORAL_TABLET | Freq: Every day | ORAL | Status: DC

## 2014-11-16 ENCOUNTER — Encounter: Payer: Self-pay | Admitting: Family Medicine

## 2014-11-16 NOTE — Assessment & Plan Note (Signed)
Tolerating statin, encouraged heart healthy diet, avoid trans fats, minimize simple carbs and saturated fats. Increase exercise as tolerated 

## 2014-11-16 NOTE — Progress Notes (Signed)
Jack Wade  [id number] 12/25/1941 11/16/2014      Progress Note-Follow Up  Subjective  Chief Complaint  Chief Complaint  Patient presents with  . Follow-up    4 month  . Injections    flu    HPI  Patient is a 73 y.o. male in today for routine medical care. He is doing well. No recent illness. Agrees to flu shot today. No recent trips to ER or hospitalization. Denies CP/palp/SOB/HA/congestion/fevers/GI or GU c/o. Taking meds as prescribed  Past Medical History  Diagnosis Date  . Hyperlipidemia   . Hypertension   . CAD (coronary artery disease)     2 stents  . Diabetes type 2, controlled   . Chicken pox   . Measles   . Appendicitis   . Mumps as a child  . Melanoma     Scalp. 2012  . Hypothyroidism   . Thyroid disease     Beoming Hyperthyroidism  . Dysphagia, unspecified(787.20) 03/20/2014    Past Surgical History  Procedure Laterality Date  . Appendectomy    . Coronary angioplasty with stent placement      2 stents  . Cardiac catheterization    . Back surgery      x2 lumbar  . Rotator cuff repair Right   . Wisdom tooth extraction    . Tonsillectomy and adenoidectomy    . Skin cancer excision      melanoma on scalp    Family History  Problem Relation Age of Onset  . Heart disease Father   . Emphysema Father   . Hypertension Father   . Cancer Father     lung/ healthy  . Heart disease Mother     Deceased  . Cancer Mother     breast  . Hypertension Mother   . Cancer Maternal Grandmother     colon  . Diabetes Brother     type 2  . Heart disease Brother     History   Social History  . Marital Status: Married    Spouse Name: N/A    Number of Children: 2  . Years of Education: N/A   Occupational History  . Not on file.   Social History Main Topics  . Smoking status: Former Smoker -- 2.00 packs/day for 30 years    Types: Cigarettes    Start date: 12/27/1983  . Smokeless tobacco: Never Used  . Alcohol Use: Yes     Comment:  occasionally  . Drug Use: No  . 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    Other Topics Concern  . Not on file   Social History Narrative    Current Outpatient Prescriptions on File Prior to Visit  Medication Sig Dispense Refill  . aspirin 81 MG tablet Take 81 mg by mouth daily.    Marland Kitchen glipiZIDE (GLUCOTROL) 5 MG tablet Take 4 tablets (20 mg total) by mouth 2 (two) times daily before a meal. 720 tablet 1  . levothyroxine (SYNTHROID, LEVOTHROID) 100 MCG tablet Take 1 tablet (100 mcg total) by mouth daily before breakfast. 90 tablet 1  . sitaGLIPtin (JANUVIA) 100 MG tablet Take 1 tablet (100 mg total) by mouth daily. 90 tablet 1   No current facility-administered medications on file prior to visit.    No Known Allergies  Review of Systems  ROS  Objective  BP 120/52 mmHg  Pulse 60  Temp(Src) 97.8 F (36.6 C) (Oral)  Ht  6' (1.829 m)  Wt 215 lb 12.8 oz (97.886 kg)  BMI 29.26 kg/m2  SpO2 100%  Physical Exam  Physical Exam  Lab Results  Component Value Date   TSH 1.59 11/10/2014   Lab Results  Component Value Date   WBC 6.6 11/10/2014   HGB 14.3 11/10/2014   HCT 43.4 11/10/2014   MCV 83.3 11/10/2014   PLT 223.0 11/10/2014   Lab Results  Component Value Date   CREATININE 1.2 11/10/2014   BUN 13 11/10/2014   NA 139 11/10/2014   K 4.9 11/10/2014   CL 106 11/10/2014   CO2 26 11/10/2014   Lab Results  Component Value Date   ALT 19 11/10/2014   AST 22 11/10/2014   ALKPHOS 61 11/10/2014   BILITOT 0.5 11/10/2014   Lab Results  Component Value Date   CHOL 152 11/10/2014   Lab Results  Component Value Date   HDL 33.50* 11/10/2014   Lab Results  Component Value Date   LDLCALC 40 07/17/2014   Lab Results  Component Value Date   TRIG 288.0* 11/10/2014   Lab Results  Component Value Date   CHOLHDL 5 11/10/2014     Assessment & Plan  HTN (hypertension) bh  Diabetes hgba1c acceptable,  minimize simple carbs. Increase exercise as tolerated. Continue current meds  Hyperlipidemia Tolerating statin, encouraged heart healthy diet, avoid trans fats, minimize simple carbs and saturated fats. Increase exercise as tolerated  Hypothyroidism On Levothyroxine, continue to monitor

## 2014-11-16 NOTE — Assessment & Plan Note (Signed)
hgba1c acceptable, minimize simple carbs. Increase exercise as tolerated. Continue current meds 

## 2014-11-16 NOTE — Assessment & Plan Note (Signed)
On Levothyroxine, continue to monitor 

## 2014-11-16 NOTE — Assessment & Plan Note (Signed)
.  bh

## 2014-12-06 ENCOUNTER — Other Ambulatory Visit: Payer: Self-pay | Admitting: Family Medicine

## 2014-12-08 MED ORDER — SIMVASTATIN 40 MG PO TABS: 40.0000 mg | ORAL_TABLET | Freq: Every day | ORAL | Status: DC

## 2014-12-11 ENCOUNTER — Telehealth: Payer: Self-pay | Admitting: Family Medicine

## 2014-12-11 NOTE — Telephone Encounter (Signed)
Pt is following up, states they are leaving to go out of town at 4:30 am and needs the rx prior to leaving

## 2014-12-11 NOTE — Telephone Encounter (Signed)
Patient unsure of what he needs. Michela Pitcher his wife was the one doing the requesting. Left a message on [phone] to call back.

## 2014-12-11 NOTE — Telephone Encounter (Signed)
Caller name: Pharmcist  Relation to pt: other  Call back number: [place]   Reason for call:  Pt is currently at the [place] requesting JANUVIA 100 MG tablet please send to retaill

## 2014-12-12 NOTE — Telephone Encounter (Signed)
Received response via wife's PCP

## 2015-01-21 DIAGNOSIS — Z08 Encounter for follow-up examination after completed treatment for malignant neoplasm: Secondary | ICD-10-CM | POA: Diagnosis not present

## 2015-01-21 DIAGNOSIS — D485 Neoplasm of uncertain behavior of skin: Secondary | ICD-10-CM | POA: Diagnosis not present

## 2015-01-21 DIAGNOSIS — L814 Other melanin hyperpigmentation: Secondary | ICD-10-CM | POA: Diagnosis not present

## 2015-01-21 DIAGNOSIS — Z85820 Personal history of malignant melanoma of skin: Secondary | ICD-10-CM | POA: Diagnosis not present

## 2015-02-02 ENCOUNTER — Encounter: Payer: Self-pay | Admitting: Family Medicine

## 2015-02-02 ENCOUNTER — Ambulatory Visit (INDEPENDENT_AMBULATORY_CARE_PROVIDER_SITE_OTHER): Payer: Medicare Other | Admitting: Family Medicine

## 2015-02-02 ENCOUNTER — Ambulatory Visit (HOSPITAL_BASED_OUTPATIENT_CLINIC_OR_DEPARTMENT_OTHER)
Admission: RE | Admit: 2015-02-02 | Discharge: 2015-02-02 | Disposition: A | Discharge status: Home / Self Care | Payer: Medicare Other | Source: Ambulatory Visit | Attending: Family Medicine | Admitting: Family Medicine

## 2015-02-02 VITALS — BP 122/68 | HR 78 | Temp 97.8°F | Ht 72.0 in | Wt 217.0 lb

## 2015-02-02 DIAGNOSIS — I10 Essential (primary) hypertension: Secondary | ICD-10-CM | POA: Diagnosis not present

## 2015-02-02 DIAGNOSIS — M5440 Lumbago with sciatica, unspecified side: Secondary | ICD-10-CM | POA: Diagnosis not present

## 2015-02-02 DIAGNOSIS — I700 Atherosclerosis of aorta: Secondary | ICD-10-CM

## 2015-02-02 DIAGNOSIS — M5136 Other intervertebral disc degeneration, lumbar region: Secondary | ICD-10-CM | POA: Diagnosis not present

## 2015-02-02 DIAGNOSIS — M5137 Other intervertebral disc degeneration, lumbosacral region: Secondary | ICD-10-CM | POA: Diagnosis not present

## 2015-02-02 DIAGNOSIS — M5442 Lumbago with sciatica, left side: Secondary | ICD-10-CM

## 2015-02-02 DIAGNOSIS — E1159 Type 2 diabetes mellitus with other circulatory complications: Secondary | ICD-10-CM

## 2015-02-02 DIAGNOSIS — M47817 Spondylosis without myelopathy or radiculopathy, lumbosacral region: Secondary | ICD-10-CM | POA: Diagnosis not present

## 2015-02-02 DIAGNOSIS — G609 Hereditary and idiopathic neuropathy, unspecified: Secondary | ICD-10-CM | POA: Diagnosis not present

## 2015-02-02 DIAGNOSIS — E785 Hyperlipidemia, unspecified: Secondary | ICD-10-CM | POA: Diagnosis not present

## 2015-02-02 DIAGNOSIS — M47896 Other spondylosis, lumbar region: Secondary | ICD-10-CM | POA: Insufficient documentation

## 2015-02-02 DIAGNOSIS — M545 Low back pain: Secondary | ICD-10-CM | POA: Diagnosis present

## 2015-02-02 MED ORDER — GABAPENTIN 100 MG PO CAPS: ORAL_CAPSULE | ORAL | Status: DC

## 2015-02-02 NOTE — Progress Notes (Signed)
Pre visit review using our clinic review tool, if applicable. No additional management support is needed unless otherwise documented below in the visit note. 

## 2015-02-02 NOTE — Patient Instructions (Signed)
Curcumen/Turmeric daily Salon Pas patches to posterior hip at bed   Neuropathic Pain We often think that pain has a physical cause. If we get rid of the cause, the pain should go away. Nerves themselves can also cause pain. It is called neuropathic pain, which means nerve abnormality. It may be difficult for the patients who have it and for the treating caregivers. Pain is usually described as acute (short-lived) or chronic (long-lasting). Acute pain is related to the physical sensations caused by an injury. It can last from a few seconds to many weeks, but it usually goes away when normal healing occurs. Chronic pain lasts beyond the typical healing time. With neuropathic pain, the nerve fibers themselves may be damaged or injured. They then send incorrect signals to other pain centers. The pain you feel is real, but the cause is not easy to find.  CAUSES  Chronic pain can result from diseases, such as diabetes and shingles (an infection related to chickenpox), or from trauma, surgery, or amputation. It can also happen without any known injury or disease. The nerves are sending pain messages, even though there is no identifiable cause for such messages.   Other common causes of neuropathy include diabetes, phantom limb pain, or Regional Pain Syndrome (RPS).  As with all forms of chronic back pain, if neuropathy is not correctly treated, there can be a number of associated problems that lead to a downward cycle for the patient. These include depression, sleeplessness, feelings of fear and anxiety, limited social interaction and inability to do normal daily activities or work.  The most dramatic and mysterious example of neuropathic pain is called "phantom limb syndrome." This occurs when an arm or a leg has been removed because of illness or injury. The brain still gets pain messages from the nerves that originally carried impulses from the missing limb. These nerves now seem to misfire and cause  troubling pain.  Neuropathic pain often seems to have no cause. It responds poorly to standard pain treatment. Neuropathic pain can occur after:  Shingles (herpes zoster virus infection).  A lasting burning sensation of the skin, caused usually by injury to a peripheral nerve.  Peripheral neuropathy which is widespread nerve damage, often caused by diabetes or alcoholism.  Phantom limb pain following an amputation.  Facial nerve problems (trigeminal neuralgia).  Multiple sclerosis.  Reflex sympathetic dystrophy.  Pain which comes with cancer and cancer chemotherapy.  Entrapment neuropathy such as when pressure is put on a nerve such as in carpal tunnel syndrome.  Back, leg, and hip problems (sciatica).  Spine or back surgery.  HIV Infection or AIDS where nerves are infected by viruses. Your caregiver can explain items in the above list which may apply to you. SYMPTOMS  Characteristics of neuropathic pain are:  Severe, sharp, electric shock-like, shooting, lightening-like, knife-like.  Pins and needles sensation.  Deep burning, deep cold, or deep ache.  Persistent numbness, tingling, or weakness.  Pain resulting from light touch or other stimulus that would not usually cause pain.  Increased sensitivity to something that would normally cause pain, such as a pinprick. Pain may persist for months or years following the healing of damaged tissues. When this happens, pain signals no longer sound an alarm about current injuries or injuries about to happen. Instead, the alarm system itself is not working correctly.  Neuropathic pain may get worse instead of better over time. For some people, it can lead to serious disability. It is important to be aware that  severe injury in a limb can occur without a proper, protective pain response.Burns, cuts, and other injuries may go unnoticed. Without proper treatment, these injuries can become infected or lead to further disability. Take  any injury seriously, and consult your caregiver for treatment. DIAGNOSIS  When you have a pain with no known cause, your caregiver will probably ask some specific questions:   Do you have any other conditions, such as diabetes, shingles, multiple sclerosis, or HIV infection?  How would you describe your pain? (Neuropathic pain is often described as shooting, stabbing, burning, or searing.)  Is your pain worse at any time of the day? (Neuropathic pain is usually worse at night.)  Does the pain seem to follow a certain physical pathway?  Does the pain come from an area that has missing or injured nerves? (An example would be phantom limb pain.)  Is the pain triggered by minor things such as rubbing against the sheets at night? These questions often help define the type of pain involved. Once your caregiver knows what is happening, treatment can begin. Anticonvulsant, antidepressant drugs, and various pain relievers seem to work in some cases. If another condition, such as diabetes is involved, better management of that disorder may relieve the neuropathic pain.  TREATMENT  Neuropathic pain is frequently long-lasting and tends not to respond to treatment with narcotic type pain medication. It may respond well to other drugs such as antiseizure and antidepressant medications. Usually, neuropathic problems do not completely go away, but partial improvement is often possible with proper treatment. Your caregivers have large numbers of medications available to treat you. Do not be discouraged if you do not get immediate relief. Sometimes different medications or a combination of medications will be tried before you receive the results you are hoping for. See your caregiver if you have pain that seems to be coming from nowhere and does not go away. Help is available.  SEEK IMMEDIATE MEDICAL CARE IF:   There is a sudden change in the quality of your pain, especially if the change is on only one side of  the body.  You notice changes of the skin, such as redness, black or purple discoloration, swelling, or an ulcer.  You cannot move the affected limbs. Document Released: 09/08/2004 Document Revised: 03/05/2012 Document Reviewed: 09/08/2004 [facility] Patient Information 2015 [place], [place]. This information is not intended to replace advice given to you by your health care provider. Make sure you discuss any questions you have with your health care provider.

## 2015-02-04 ENCOUNTER — Telehealth: Payer: Self-pay | Admitting: Family Medicine

## 2015-02-04 NOTE — Telephone Encounter (Signed)
Caller name: ibrohim Relation to pt: self Call back number: [phone] Pharmacy:  Reason for call:   Wants to discuss xray results.

## 2015-02-05 ENCOUNTER — Telehealth: Payer: Self-pay | Admitting: Cardiology

## 2015-02-05 DIAGNOSIS — I700 Atherosclerosis of aorta: Secondary | ICD-10-CM

## 2015-02-05 NOTE — Telephone Encounter (Signed)
If the Gabapentin is helping he can still keep taking it. The calcifications happen in the arteries as we age and things like smoking and high blood pressure make it worse. We have to control risk factors. He is not highly symptomatic but we could order an abdominal ultrasound to investigate his aorta further. I will order if he is willing to proceed, can get it in the [facility]

## 2015-02-05 NOTE — Telephone Encounter (Signed)
Follow Up  Pt wife called back and provided a cell number if you cannot reach her with the home number

## 2015-02-05 NOTE — Telephone Encounter (Signed)
Called the patient informed of results.  The patient would like an explanation of the extensive calcification of the abdominal aorta and if this is a concern and what it means?  Also, based on these results does he continue taking Gabapentin?

## 2015-02-05 NOTE — Telephone Encounter (Signed)
Left message for pt to call.

## 2015-02-05 NOTE — Telephone Encounter (Signed)
New Message  When he had a back Xray due to complaining of pain in back and legs they found extensive calcification in Abdominal aortic and common illiac arteries. Requests a sooner appt with Dr. Stanford Breed his next appt is in March. Please assist

## 2015-02-05 NOTE — Telephone Encounter (Signed)
Spoke with pt wife, they would like further testing done to check the amount of calcification in those arteries. Will forward for dr Stanford Breed review

## 2015-02-06 ENCOUNTER — Other Ambulatory Visit: Payer: Self-pay | Admitting: Family Medicine

## 2015-02-06 DIAGNOSIS — R109 Unspecified abdominal pain: Secondary | ICD-10-CM

## 2015-02-06 DIAGNOSIS — E119 Type 2 diabetes mellitus without complications: Secondary | ICD-10-CM

## 2015-02-06 DIAGNOSIS — I10 Essential (primary) hypertension: Secondary | ICD-10-CM

## 2015-02-06 DIAGNOSIS — Z87891 Personal history of nicotine dependence: Secondary | ICD-10-CM

## 2015-02-06 DIAGNOSIS — I700 Atherosclerosis of aorta: Secondary | ICD-10-CM

## 2015-02-06 NOTE — Telephone Encounter (Signed)
Schedule abd ultrasound to exclude aneurysm and abis with doppler, fu as scheduled Kirk Ruths

## 2015-02-06 NOTE — Telephone Encounter (Signed)
Called and informed the patients wife of information.  She would like PCP to proceed with referral for abdominal ultrasound.

## 2015-02-09 ENCOUNTER — Encounter: Payer: Self-pay | Admitting: Family Medicine

## 2015-02-09 DIAGNOSIS — M549 Dorsalgia, unspecified: Secondary | ICD-10-CM

## 2015-02-09 DIAGNOSIS — G609 Hereditary and idiopathic neuropathy, unspecified: Secondary | ICD-10-CM

## 2015-02-09 DIAGNOSIS — I700 Atherosclerosis of aorta: Secondary | ICD-10-CM

## 2015-02-09 HISTORY — DX: Hereditary and idiopathic neuropathy, unspecified: G60.9

## 2015-02-09 HISTORY — DX: Dorsalgia, unspecified: M54.9

## 2015-02-09 HISTORY — DX: Atherosclerosis of aorta: I70.0

## 2015-02-09 NOTE — Assessment & Plan Note (Signed)
Discovered on xray of Lumbar spine on 02/02/15, will proceed with aortic ultrasound to further investigate, does have a history of cigarette use.

## 2015-02-09 NOTE — Assessment & Plan Note (Signed)
Xray today confirms moderate degenerative changes in lumbar spine. Encouraged moist heat and gentle stretching as tolerated. May try NSAIDs and prescription meds as directed and report if symptoms worsen or seek immediate care. May need referral if worsens

## 2015-02-09 NOTE — Assessment & Plan Note (Signed)
Tolerating statin, encouraged heart healthy diet, avoid trans fats, minimize simple carbs and saturated fats. Increase exercise as tolerated 

## 2015-02-09 NOTE — Assessment & Plan Note (Signed)
Describes worsening burning in feet. Started on Gabapentin and titrate up as needed and tolerated.

## 2015-02-09 NOTE — Assessment & Plan Note (Signed)
hgba1c acceptable, minimize simple carbs. Increase exercise as tolerated. Continue current meds 

## 2015-02-09 NOTE — Progress Notes (Signed)
Jack Wade  [id number] 04/14/1941 02/09/2015      Progress Note-Follow Up  Subjective  Chief Complaint  Chief Complaint  Patient presents with  . Back Pain  . Leg Pain    HPI  Patient is a 74 y.o. male in today for routine medical care. Patient is in today for evaluation of worsening pain. He describes a long history of low back trouble having had 2 back surgeries in the past. His first surgery was roughly 30 years ago at L4-L5. He had manage the pain for many years but his pain is recently worsened he actually describes 2 different pains. He has a lot of pain in his left leg and left hip. Unable to sleep on his left side. Hurts on his right side but not as much. He notes worsening weakness in his legs. His biggest complaint though is burning in his feet. This interrupts his sleep as well and has progressively worsened. He denies any incontinence. Denies any recent falls or trauma. Denies polyuria or polydipsia. Is trying to eat a heart healthy diet minimize carbohydrates. No recent illness. Denies CP/palp/SOB/HA/congestion/fevers/GI or GU c/o. Taking meds as prescribed  Past Medical History  Diagnosis Date  . Hyperlipidemia   . Hypertension   . CAD (coronary artery disease)     2 stents  . Diabetes type 2, controlled   . Chicken pox   . Measles   . Appendicitis   . Mumps as a child  . Melanoma     Scalp. 2012  . Hypothyroidism   . Thyroid disease     Beoming Hyperthyroidism  . Dysphagia, unspecified(787.20) 03/20/2014  . Hereditary and idiopathic peripheral neuropathy 02/09/2015  . Back pain 02/09/2015  . Aortic calcification 02/09/2015    Past Surgical History  Procedure Laterality Date  . Appendectomy    . Coronary angioplasty with stent placement      2 stents  . Cardiac catheterization    . Back surgery      x2 lumbar  . Rotator cuff repair Right   . Wisdom tooth extraction    . Tonsillectomy and adenoidectomy    . Skin cancer excision      melanoma on  scalp    Family History  Problem Relation Age of Onset  . Heart disease Father   . Emphysema Father   . Hypertension Father   . Cancer Father     lung/ healthy  . Heart disease Mother     Deceased  . Cancer Mother     breast  . Hypertension Mother   . Cancer Maternal Grandmother     colon  . Diabetes Brother     type 2  . Heart disease Brother     History   Social History  . Marital Status: Married    Spouse Name: N/A  . Number of Children: 2  . Years of Education: N/A   Occupational History  . Not on file.   Social History Main Topics  . Smoking status: Former Smoker -- 2.00 packs/day for 30 years    Types: Cigarettes    Start date: 12/27/1983  . Smokeless tobacco: Never Used  . Alcohol Use: Yes     Comment: occasionally  . Drug Use: No  . 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    Other Topics Concern  . Not on file   Social History Narrative    Current Outpatient Prescriptions on File  Prior to Visit  Medication Sig Dispense Refill  . aspirin 81 MG tablet Take 81 mg by mouth daily.    Marland Kitchen glipiZIDE (GLUCOTROL) 5 MG tablet Take 4 tablets (20 mg total) by mouth 2 (two) times daily before a meal. 720 tablet 1  . JANUVIA 100 MG tablet TAKE 1 TABLET (100 MG TOTAL) BY MOUTH DAILY. 90 tablet 1  . levothyroxine (SYNTHROID, LEVOTHROID) 100 MCG tablet TAKE 1 TABLET (100 MCG TOTAL) BY MOUTH DAILY BEFORE BREAKFAST. 90 tablet 1  . metFORMIN (GLUCOPHAGE XR) 500 MG 24 hr tablet Take 2 tablets (1,000 mg total) by mouth 2 (two) times daily. Failed short acting Metformin with nausea 360 tablet 1  . simvastatin (ZOCOR) 40 MG tablet Take 1 tablet (40 mg total) by mouth daily. 90 tablet 1   No current facility-administered medications on file prior to visit.    No Known Allergies  Review of Systems  Review of Systems  Constitutional: Negative for fever and malaise/fatigue.  HENT: Negative for congestion.   Eyes:  Negative for discharge.  Respiratory: Negative for shortness of breath.   Cardiovascular: Negative for chest pain, palpitations and leg swelling.  Gastrointestinal: Negative for nausea, abdominal pain and diarrhea.  Genitourinary: Negative for dysuria.  Musculoskeletal: Positive for myalgias and back pain. Negative for falls.  Skin: Negative for rash.  Neurological: Positive for tingling. Negative for loss of consciousness and headaches.  Endo/Heme/Allergies: Negative for polydipsia.  Psychiatric/Behavioral: Negative for depression and suicidal ideas. The patient has insomnia. The patient is not nervous/anxious.     Objective  BP 122/68 mmHg  Pulse 78  Temp(Src) 97.8 F (36.6 C) (Oral)  Ht 6' (1.829 m)  Wt 217 lb (98.431 kg)  BMI 29.42 kg/m2  SpO2 97%  Physical Exam  Physical Exam  Constitutional: He is oriented to person, place, and time and well-developed, well-nourished, and in no distress. No distress.  HENT:  Head: Normocephalic and atraumatic.  Eyes: Conjunctivae are normal.  Neck: Neck supple. No thyromegaly present.  Cardiovascular: Normal rate, regular rhythm and normal heart sounds.   Pulmonary/Chest: Effort normal and breath sounds normal. No respiratory distress.  Abdominal: He exhibits no distension and no mass. There is no tenderness.  Musculoskeletal: He exhibits no edema.  Neurological: He is alert and oriented to person, place, and time.  Skin: Skin is warm.  Psychiatric: Memory, affect and judgment normal.    Lab Results  Component Value Date   TSH 1.59 11/10/2014   Lab Results  Component Value Date   WBC 6.6 11/10/2014   HGB 14.3 11/10/2014   HCT 43.4 11/10/2014   MCV 83.3 11/10/2014   PLT 223.0 11/10/2014   Lab Results  Component Value Date   CREATININE 1.2 11/10/2014   BUN 13 11/10/2014   NA 139 11/10/2014   K 4.9 11/10/2014   CL 106 11/10/2014   CO2 26 11/10/2014   Lab Results  Component Value Date   ALT 19 11/10/2014   AST 22  11/10/2014   ALKPHOS 61 11/10/2014   BILITOT 0.5 11/10/2014   Lab Results  Component Value Date   CHOL 152 11/10/2014   Lab Results  Component Value Date   HDL 33.50* 11/10/2014   Lab Results  Component Value Date   LDLCALC 40 07/17/2014   Lab Results  Component Value Date   TRIG 288.0* 11/10/2014   Lab Results  Component Value Date   CHOLHDL 5 11/10/2014     Assessment & Plan  HTN (hypertension) Well controlled,  no changes to meds. Encouraged heart healthy diet such as the DASH diet and exercise as tolerated.    Diabetes hgba1c acceptable, minimize simple carbs. Increase exercise as tolerated. Continue current meds   Hyperlipidemia Tolerating statin, encouraged heart healthy diet, avoid trans fats, minimize simple carbs and saturated fats. Increase exercise as tolerated   Hereditary and idiopathic peripheral neuropathy Describes worsening burning in feet. Started on Gabapentin and titrate up as needed and tolerated.   Back pain Xray today confirms moderate degenerative changes in lumbar spine. Encouraged moist heat and gentle stretching as tolerated. May try NSAIDs and prescription meds as directed and report if symptoms worsen or seek immediate care. May need referral if worsens   Aortic calcification Discovered on xray of Lumbar spine on 02/02/15, will proceed with aortic ultrasound to further investigate, does have a history of cigarette use.

## 2015-02-09 NOTE — Assessment & Plan Note (Signed)
Well controlled, no changes to meds. Encouraged heart healthy diet such as the DASH diet and exercise as tolerated.  °

## 2015-02-10 ENCOUNTER — Telehealth: Payer: Self-pay | Admitting: Cardiology

## 2015-02-10 ENCOUNTER — Ambulatory Visit (HOSPITAL_BASED_OUTPATIENT_CLINIC_OR_DEPARTMENT_OTHER)
Admission: RE | Admit: 2015-02-10 | Discharge: 2015-02-10 | Disposition: A | Discharge status: Home / Self Care | Payer: Medicare Other | Source: Ambulatory Visit | Attending: Family Medicine | Admitting: Family Medicine

## 2015-02-10 DIAGNOSIS — I714 Abdominal aortic aneurysm, without rupture: Secondary | ICD-10-CM | POA: Insufficient documentation

## 2015-02-10 DIAGNOSIS — I700 Atherosclerosis of aorta: Secondary | ICD-10-CM

## 2015-02-10 DIAGNOSIS — R109 Unspecified abdominal pain: Secondary | ICD-10-CM

## 2015-02-10 DIAGNOSIS — I10 Essential (primary) hypertension: Secondary | ICD-10-CM

## 2015-02-10 DIAGNOSIS — Z87891 Personal history of nicotine dependence: Secondary | ICD-10-CM | POA: Diagnosis not present

## 2015-02-10 DIAGNOSIS — Z136 Encounter for screening for cardiovascular disorders: Secondary | ICD-10-CM | POA: Diagnosis not present

## 2015-02-10 DIAGNOSIS — Z8489 Family history of other specified conditions: Secondary | ICD-10-CM | POA: Diagnosis not present

## 2015-02-10 DIAGNOSIS — E119 Type 2 diabetes mellitus without complications: Secondary | ICD-10-CM

## 2015-02-10 DIAGNOSIS — I739 Peripheral vascular disease, unspecified: Secondary | ICD-10-CM

## 2015-02-10 NOTE — Telephone Encounter (Signed)
Returned call to patient she stated she received a call this morning about husband's abdominal u/s.Stated they were concerned about having extensive plaque.Stated she wanted him to have iliacs and legs checked.Stated he has pain in both legs when he walks.Message sent to [facility] nurse Debra.

## 2015-02-10 NOTE — Telephone Encounter (Signed)
Spoke with pt, dopplers have been ordered by dr blythe and the patient will have those done today. Will await the results.

## 2015-02-10 NOTE — Telephone Encounter (Signed)
Calling about the results of his test also wanted to let you know that Jack Wade did not have a doppler of his legs today . Please Call    Thanks

## 2015-02-10 NOTE — Telephone Encounter (Signed)
Spoke with pt wife, aware order placed for [facility]. Will get those results and decide the best course of action.

## 2015-02-11 ENCOUNTER — Telehealth (HOSPITAL_COMMUNITY): Payer: Self-pay | Admitting: *Deleted

## 2015-02-17 ENCOUNTER — Ambulatory Visit (HOSPITAL_COMMUNITY)
Admission: RE | Admit: 2015-02-17 | Discharge: 2015-02-17 | Disposition: A | Discharge status: Home / Self Care | Payer: Medicare Other | Source: Ambulatory Visit | Attending: Cardiovascular Disease | Admitting: Cardiovascular Disease

## 2015-02-17 DIAGNOSIS — I739 Peripheral vascular disease, unspecified: Secondary | ICD-10-CM | POA: Diagnosis not present

## 2015-02-17 NOTE — Progress Notes (Signed)
Arterial Lower Ext. Duplex Completed. Gertrude Bucks, BS, RDMS, RVT  

## 2015-02-18 ENCOUNTER — Encounter: Payer: Self-pay | Admitting: Cardiology

## 2015-02-18 NOTE — Telephone Encounter (Signed)
This encounter was created in error - please disregard.

## 2015-02-18 NOTE — Telephone Encounter (Signed)
New message     Want test results from tues

## 2015-02-18 NOTE — Telephone Encounter (Signed)
Pt's wife called in wanting to know if the pt's results from his test were in yet. Please f/u  Thanks

## 2015-02-19 ENCOUNTER — Telehealth: Payer: Self-pay | Admitting: Cardiology

## 2015-02-19 NOTE — Telephone Encounter (Signed)
Mrs.Asmar is calling about the results from his ultrasound which was done on Tuesday .Marland Kitchen Please call   Thanks

## 2015-02-19 NOTE — Telephone Encounter (Signed)
Returned call to patient's wife results not available.Dr.Crenshaw's nurse will call back when available.

## 2015-02-24 ENCOUNTER — Encounter: Payer: Self-pay | Admitting: Cardiology

## 2015-02-24 NOTE — Telephone Encounter (Signed)
This encounter was created in error - please disregard.

## 2015-02-24 NOTE — Telephone Encounter (Signed)
New Message  Pt wife calling back about pt's lab results. Please use home number then cell if not working. Please call back and discuss.

## 2015-03-03 ENCOUNTER — Encounter: Payer: Self-pay | Admitting: Family Medicine

## 2015-03-03 ENCOUNTER — Ambulatory Visit (INDEPENDENT_AMBULATORY_CARE_PROVIDER_SITE_OTHER): Payer: Medicare Other | Admitting: Family Medicine

## 2015-03-03 VITALS — BP 110/64 | HR 67 | Temp 97.7°F | Ht 72.0 in | Wt 218.5 lb

## 2015-03-03 DIAGNOSIS — M5442 Lumbago with sciatica, left side: Secondary | ICD-10-CM | POA: Diagnosis not present

## 2015-03-03 DIAGNOSIS — C439 Malignant melanoma of skin, unspecified: Secondary | ICD-10-CM

## 2015-03-03 DIAGNOSIS — E1159 Type 2 diabetes mellitus with other circulatory complications: Secondary | ICD-10-CM | POA: Diagnosis not present

## 2015-03-03 DIAGNOSIS — E785 Hyperlipidemia, unspecified: Secondary | ICD-10-CM

## 2015-03-03 DIAGNOSIS — E119 Type 2 diabetes mellitus without complications: Secondary | ICD-10-CM

## 2015-03-03 DIAGNOSIS — E039 Hypothyroidism, unspecified: Secondary | ICD-10-CM | POA: Diagnosis not present

## 2015-03-03 DIAGNOSIS — I10 Essential (primary) hypertension: Secondary | ICD-10-CM

## 2015-03-03 LAB — LIPID PANEL
Cholesterol: 126 mg/dL (ref 0–200)
HDL: 42.9 mg/dL (ref >39.00)
LDL Cholesterol: 46 mg/dL (ref 0–99)
NonHDL: 83.1
Total CHOL/HDL Ratio: 3
Triglycerides: 188 mg/dL — ABNORMAL HIGH (ref 0.0–149.0)
VLDL: 37.6 mg/dL (ref 0.0–40.0)

## 2015-03-03 LAB — TSH: TSH: 2.96 u[IU]/mL (ref 0.35–4.50)

## 2015-03-03 LAB — CBC
HCT: 43.9 % (ref 39.0–52.0)
Hemoglobin: 14.8 g/dL (ref 13.0–17.0)
MCHC: 33.6 g/dL (ref 30.0–36.0)
MCV: 82.8 fl (ref 78.0–100.0)
Platelets: 207 10*3/uL (ref 150.0–400.0)
RBC: 5.31 Mil/uL (ref 4.22–5.81)
RDW: 14 % (ref 11.5–15.5)
WBC: 7.4 10*3/uL (ref 4.0–10.5)

## 2015-03-03 LAB — COMPREHENSIVE METABOLIC PANEL
ALT: 18 U/L (ref 0–53)
AST: 22 U/L (ref 0–37)
Albumin: 4.5 g/dL (ref 3.5–5.2)
Alkaline Phosphatase: 60 U/L (ref 39–117)
BUN: 16 mg/dL (ref 6–23)
CO2: 29 mEq/L (ref 19–32)
Calcium: 9.8 mg/dL (ref 8.4–10.5)
Chloride: 104 mEq/L (ref 96–112)
Creatinine, Ser: 1.28 mg/dL (ref 0.40–1.50)
GFR: 58.67 mL/min — ABNORMAL LOW (ref >60.00)
Glucose, Bld: 244 mg/dL — ABNORMAL HIGH (ref 70–99)
Potassium: 4.3 mEq/L (ref 3.5–5.1)
Sodium: 137 mEq/L (ref 135–145)
Total Bilirubin: 0.4 mg/dL (ref 0.2–1.2)
Total Protein: 7.5 g/dL (ref 6.0–8.3)

## 2015-03-03 LAB — MICROALBUMIN / CREATININE URINE RATIO
Creatinine,U: 104.7 mg/dL
Microalb Creat Ratio: 0.7 mg/g (ref 0.0–30.0)
Microalb, Ur: <0.7 mg/dL (ref 0.0–1.9)

## 2015-03-03 LAB — HEMOGLOBIN A1C: Hgb A1c MFr Bld: 7.1 % — ABNORMAL HIGH (ref 4.6–6.5)

## 2015-03-03 MED ORDER — LEVOTHYROXINE SODIUM 100 MCG PO TABS: ORAL_TABLET | ORAL | Status: DC

## 2015-03-03 MED ORDER — SITAGLIPTIN PHOSPHATE 100 MG PO TABS: ORAL_TABLET | ORAL | Status: DC

## 2015-03-03 MED ORDER — METFORMIN HCL ER 500 MG PO TB24: 1000.0000 mg | ORAL_TABLET | Freq: Two times a day (BID) | ORAL | Status: DC

## 2015-03-03 MED ORDER — SIMVASTATIN 40 MG PO TABS: 40.0000 mg | ORAL_TABLET | Freq: Every day | ORAL | Status: DC

## 2015-03-03 MED ORDER — GLIPIZIDE 5 MG PO TABS: 10.0000 mg | ORAL_TABLET | Freq: Two times a day (BID) | ORAL | Status: DC

## 2015-03-03 NOTE — Progress Notes (Signed)
Pre visit review using our clinic review tool, if applicable. No additional management support is needed unless otherwise documented below in the visit note. 

## 2015-03-03 NOTE — Patient Instructions (Addendum)
Rel of Rec surgeries in [place] roughly 20 years ago Dr Aris Lot with [place] at [facility] And  Dr Royann Shivers (sp?) at [facility] ( [facility] of [place]) was the second surgery Would like office notes, MRIs, all op reports    Degenerative Disk Disease Degenerative disk disease is a condition caused by the changes that occur in the cushions of the backbone (spinal disks) as you grow older. Spinal disks are soft and compressible disks located between the bones of the spine (vertebrae). They act like shock absorbers. Degenerative disk disease can affect the whole spine. However, the neck and lower back are most commonly affected. Many changes can occur in the spinal disks with aging, such as:  The spinal disks may dry and shrink.  Small tears may occur in the tough, outer covering of the disk (annulus).  The disk space may become smaller due to loss of water.  Abnormal growths in the bone (spurs) may occur. This can put pressure on the nerve roots exiting the spinal canal, causing pain.  The spinal canal may become narrowed. CAUSES  Degenerative disk disease is a condition caused by the changes that occur in the spinal disks with aging. The exact cause is not known, but there is a genetic basis for many patients. Degenerative changes can occur due to loss of fluid in the disk. This makes the disk thinner and reduces the space between the backbones. Small cracks can develop in the outer layer of the disk. This can lead to the breakdown of the disk. You are more likely to get degenerative disk disease if you are overweight. Smoking cigarettes and doing heavy work such as weightlifting can also increase your risk of this condition. Degenerative changes can start after a sudden injury. Growth of bone spurs can compress the nerve roots and cause pain.  SYMPTOMS  The symptoms vary from person to person. Some people may have no pain, while others have severe pain.  The pain may be so severe that it can limit your activities. The location of the pain depends on the part of your backbone that is affected. You will have neck or arm pain if a disk in the neck area is affected. You will have pain in your back, buttocks, or legs if a disk in the lower back is affected. The pain becomes worse while bending, reaching up, or with twisting movements. The pain may start gradually and then get worse as time passes. It may also start after a major or minor injury. You may feel numbness or tingling in the arms or legs.  DIAGNOSIS  Your caregiver will ask you about your symptoms and about activities or habits that may cause the pain. He or she may also ask about any injuries, diseases, or treatments you have had earlier. Your caregiver will examine you to check for the range of movement that is possible in the affected area, to check for strength in your extremities, and to check for sensation in the areas of the arms and legs supplied by different nerve roots. An X-ray of the spine may be taken. Your caregiver may suggest other imaging tests, such as magnetic resonance imaging (MRI), if needed.  TREATMENT  Treatment includes rest, modifying your activities, and applying ice and heat. Your caregiver may prescribe medicines to reduce your pain and may ask you to do some exercises to strengthen your back. In some cases, you may need surgery. You and your caregiver will decide  on the treatment that is best for you. HOME CARE INSTRUCTIONS   Follow proper lifting and walking techniques as advised by your caregiver.  Maintain good posture.  Exercise regularly as advised.  Perform relaxation exercises.  Change your sitting, standing, and sleeping habits as advised. Change positions frequently.  Lose weight as advised.  Stop smoking if you smoke.  Wear supportive footwear. SEEK MEDICAL CARE IF:  Your pain does not go away within 1 to 4 weeks. SEEK IMMEDIATE MEDICAL CARE IF:    Your pain is severe.  You notice weakness in your arms, hands, or legs.  You begin to lose control of your bladder or bowel movements. MAKE SURE YOU:   Understand these instructions.  Will watch your condition.  Will get help right away if you are not doing well or get worse. Document Released: 10/09/2007 Document Revised: 03/05/2012 Document Reviewed: 04/15/2014 [facility] Patient Information 2015 [place], [place]. This information is not intended to replace advice given to you by your health care provider. Make sure you discuss any questions you have with your health care provider.

## 2015-03-07 ENCOUNTER — Other Ambulatory Visit (HOSPITAL_BASED_OUTPATIENT_CLINIC_OR_DEPARTMENT_OTHER): Payer: Medicare Other

## 2015-03-11 ENCOUNTER — Encounter: Payer: Self-pay | Admitting: Family Medicine

## 2015-03-11 NOTE — Assessment & Plan Note (Signed)
Tolerating statin, encouraged heart healthy diet, avoid trans fats, minimize simple carbs and saturated fats. Increase exercise as tolerated 

## 2015-03-11 NOTE — Assessment & Plan Note (Signed)
Well controlled, no changes to meds. Encouraged heart healthy diet such as the DASH diet and exercise as tolerated.  °

## 2015-03-11 NOTE — Assessment & Plan Note (Signed)
On Levothyroxine, continue to monitor 

## 2015-03-11 NOTE — Progress Notes (Signed)
Patient ID: Jack Wade, male   DOB: [birth year], 74 y.o.   MRN: [id number]   Jack Wade  [id number] [birth year] 03/11/2015      Progress Note-Follow Up  Subjective  Chief Complaint  Chief Complaint  Patient presents with  . Back Pain  . Leg Pain    HPI  Patient is a 74 y.o. male in today for routine medical care. Patient is in today for follow-up and is struggling with worsening low back pain and radicular pain down his left leg. He has trouble finding a comfortable position. The pain is worse at the end of the day but is present fairly consistently at this point. He is unable to sleep while lying on that side and his sleep is continually disrupted with his increasing pain. He had surgery in his low back 20 years ago in [place] and had done well for quite some time before his pain increased over the past year. No acute injury but he did note working at [employer] made it worse. Denies CP/palp/SOB/HA/congestion/fevers/GI or GU c/o. Taking meds as prescribed  Past Medical History  Diagnosis Date  . Hyperlipidemia   . Hypertension   . CAD (coronary artery disease)     2 stents  . Diabetes type 2, controlled   . Chicken pox   . Measles   . Appendicitis   . Mumps as a child  . Melanoma     Scalp. 2012  . Hypothyroidism   . Thyroid disease     Beoming Hyperthyroidism  . Dysphagia, unspecified(787.20) 03/20/2014  . Hereditary and idiopathic peripheral neuropathy 02/09/2015  . Back pain 02/09/2015  . Aortic calcification 02/09/2015    Past Surgical History  Procedure Laterality Date  . Appendectomy    . Coronary angioplasty with stent placement      2 stents  . Cardiac catheterization    . Back surgery      x2 lumbar  . Rotator cuff repair Right   . Wisdom tooth extraction    . Tonsillectomy and adenoidectomy    . Skin cancer excision      melanoma on scalp  . Back surgery      late 1990s had 2 surgeries, first surgery lifted a heavy engine ruptured disds, at  L4 and L5, cleaned discs no hardware very helpful. 2 years later while recovering from angiogram with weights applied to left leg caused a recurrence and required surgery again in same area with disc repair    Family History  Problem Relation Age of Onset  . Heart disease Father   . Emphysema Father   . Hypertension Father   . Cancer Father     lung/ healthy  . Heart disease Mother     Deceased  . Cancer Mother     breast  . Hypertension Mother   . Cancer Maternal Grandmother     colon  . Diabetes Brother     type 2  . Heart disease Brother     History   Social History  . Marital Status: Married    Spouse Name: N/A  . Number of Children: 2  . Years of Education: N/A   Occupational History  . Not on file.   Social History Main Topics  . Smoking status: Former Smoker -- 2.00 packs/day for 30 years    Types: Cigarettes    Start date: 12/27/1983  . Smokeless tobacco: Never Used  . Alcohol Use: Yes     Comment: occasionally  .  Drug Use: No  . 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    Other Topics Concern  . Not on file   Social History Narrative    Current Outpatient Prescriptions on File Prior to Visit  Medication Sig Dispense Refill  . aspirin 81 MG tablet Take 81 mg by mouth daily.     No current facility-administered medications on file prior to visit.    No Known Allergies  Review of Systems  Review of Systems  Constitutional: Negative for fever and malaise/fatigue.  HENT: Negative for congestion.   Eyes: Negative for discharge.  Respiratory: Negative for shortness of breath.   Cardiovascular: Negative for chest pain, palpitations and leg swelling.  Gastrointestinal: Negative for nausea, abdominal pain and diarrhea.  Genitourinary: Negative for dysuria.  Musculoskeletal: Positive for myalgias and back pain. Negative for falls.  Skin: Negative for rash.  Neurological: Negative for loss of  consciousness and headaches.  Endo/Heme/Allergies: Negative for polydipsia.  Psychiatric/Behavioral: Negative for depression and suicidal ideas. The patient is not nervous/anxious and does not have insomnia.     Objective  BP 110/64 mmHg  Pulse 67  Temp(Src) 97.7 F (36.5 C) (Oral)  Ht 6' (1.829 m)  Wt 218 lb 8 oz (99.111 kg)  BMI 29.63 kg/m2  SpO2 97%  Physical Exam  Physical Exam  Constitutional: He is oriented to person, place, and time and well-developed, well-nourished, and in no distress. No distress.  HENT:  Head: Normocephalic and atraumatic.  Eyes: Conjunctivae are normal.  Neck: Neck supple. No thyromegaly present.  Cardiovascular: Normal rate, regular rhythm and normal heart sounds.   No murmur heard. Pulmonary/Chest: Effort normal and breath sounds normal. No respiratory distress.  Abdominal: He exhibits no distension and no mass. There is no tenderness.  Musculoskeletal: He exhibits no edema.  Neurological: He is alert and oriented to person, place, and time.  Skin: Skin is warm.  Psychiatric: Memory, affect and judgment normal.    Lab Results  Component Value Date   TSH 2.96 03/03/2015   Lab Results  Component Value Date   WBC 7.4 03/03/2015   HGB 14.8 03/03/2015   HCT 43.9 03/03/2015   MCV 82.8 03/03/2015   PLT 207.0 03/03/2015   Lab Results  Component Value Date   CREATININE 1.28 03/03/2015   BUN 16 03/03/2015   NA 137 03/03/2015   K 4.3 03/03/2015   CL 104 03/03/2015   CO2 29 03/03/2015   Lab Results  Component Value Date   ALT 18 03/03/2015   AST 22 03/03/2015   ALKPHOS 60 03/03/2015   BILITOT 0.4 03/03/2015   Lab Results  Component Value Date   CHOL 126 03/03/2015   Lab Results  Component Value Date   HDL 42.90 03/03/2015   Lab Results  Component Value Date   LDLCALC 46 03/03/2015   Lab Results  Component Value Date   TRIG 188.0* 03/03/2015   Lab Results  Component Value Date   CHOLHDL 3 03/03/2015     Assessment &  Plan  HTN (hypertension) Well controlled, no changes to meds. Encouraged heart healthy diet such as the DASH diet and exercise as tolerated.    Diabetes hgba1c acceptable, minimize simple carbs. Increase exercise as tolerated. Continue current meds   Hyperlipidemia Tolerating statin, encouraged heart healthy diet, avoid trans fats, minimize simple carbs and saturated fats. Increase exercise as tolerated   Back pain Had initial surgery 20 years ago in [place]  and over past year low back pain with radicular pain down left leg has worsened. He had to quit his job at [employer] and that has helped some but he has had trouble getting comfortable and resting recently. Gabapentin was not helpful so he stopped it. We have ordered a repeat MRI and requested old records if they are available. Have referred to Neurosurgery for  Further consideration   Hypothyroidism On Levothyroxine, continue to monitor

## 2015-03-11 NOTE — Assessment & Plan Note (Signed)
hgba1c acceptable, minimize simple carbs. Increase exercise as tolerated. Continue current meds 

## 2015-03-11 NOTE — Assessment & Plan Note (Signed)
Had initial surgery 20 years ago in [place] and over past year low back pain with radicular pain down left leg has worsened. He had to quit his job at [employer] and that has helped some but he has had trouble getting comfortable and resting recently. Gabapentin was not helpful so he stopped it. We have ordered a repeat MRI and requested old records if they are available. Have referred to Neurosurgery for  Further consideration

## 2015-03-16 NOTE — Progress Notes (Signed)
HPI: FU coronary artery disease. Carotid Dopplers in August of 2013 showed no significant disease. Patient had PCI in [place] approximately 15 years ago. Records are not available. Nuclear study May 2015 showed an ejection fraction of 61%. There is a fixed defect in the basal and mid lateral wall but no ischemia. Patient did have ST changes. Lower extremity Dopplers February 2016 showed an occluded right anterior tibial artery. Abdominal ultrasound February 2016 showed no aneurysm. Laboratories March 2016 showed LDL 46. Since last seen, the patient has dyspnea with more extreme activities but not with routine activities. It is relieved with rest. It is not associated with chest pain. There is no orthopnea, PND or pedal edema. There is no syncope or palpitations. There is no exertional chest pain.   Current Outpatient Prescriptions  Medication Sig Dispense Refill  . aspirin 81 MG tablet Take 81 mg by mouth daily.    Marland Kitchen glipiZIDE (GLUCOTROL) 5 MG tablet Take 2 tablets (10 mg total) by mouth 2 (two) times daily before a meal. 1 tablet 1  . levothyroxine (SYNTHROID, LEVOTHROID) 100 MCG tablet TAKE 1 TABLET (100 MCG TOTAL) BY MOUTH DAILY BEFORE BREAKFAST. 90 tablet 1  . metFORMIN (GLUCOPHAGE XR) 500 MG 24 hr tablet Take 2 tablets (1,000 mg total) by mouth 2 (two) times daily. Failed short acting Metformin with nausea 360 tablet 1  . simvastatin (ZOCOR) 40 MG tablet Take 1 tablet (40 mg total) by mouth daily. 90 tablet 1  . sitaGLIPtin (JANUVIA) 100 MG tablet TAKE 1 TABLET (100 MG TOTAL) BY MOUTH DAILY. 90 tablet 1   No current facility-administered medications for this visit.     Past Medical History  Diagnosis Date  . Hyperlipidemia   . Hypertension   . CAD (coronary artery disease)     2 stents  . Diabetes type 2, controlled   . Chicken pox   . Measles   . Appendicitis   . Mumps as a child  . Melanoma     Scalp. 2012  . Hypothyroidism   . Thyroid disease     [place]  Hyperthyroidism  . Dysphagia, unspecified(787.20) 03/20/2014  . Hereditary and idiopathic peripheral neuropathy 02/09/2015  . Back pain 02/09/2015  . Aortic calcification 02/09/2015    Past Surgical History  Procedure Laterality Date  . Appendectomy    . Coronary angioplasty with stent placement      2 stents  . Cardiac catheterization    . Back surgery      x2 lumbar  . Rotator cuff repair Right   . Wisdom tooth extraction    . Tonsillectomy and adenoidectomy    . Skin cancer excision      melanoma on scalp  . Back surgery      late 1990s had 2 surgeries, first surgery lifted a heavy engine ruptured disds, at L4 and L5, cleaned discs no hardware very helpful. 2 years later while recovering from angiogram with weights applied to left leg caused a recurrence and required surgery again in same area with disc repair    History   Social History  . Marital Status: Married    Spouse Name: N/A  . Number of Children: 2  . Years of Education: N/A   Occupational History  . Not on file.   Social History Main Topics  . Smoking status: Former Smoker -- 2.00 packs/day for 30 years    Types: Cigarettes    Start date: 12/27/1983  . Smokeless tobacco: Never Used  .  Alcohol Use: Yes     Comment: occasionally  . Drug Use: No  . 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    Other Topics Concern  . Not on file   Social History Narrative    ROS: back pain but no fevers or chills, productive cough, hemoptysis, dysphasia, odynophagia, melena, hematochezia, dysuria, hematuria, rash, seizure activity, orthopnea, PND, pedal edema, claudication. Remaining systems are negative.  Physical Exam: Well-developed well-nourished in no acute distress.  Skin is warm and dry.  HEENT is normal.  Neck is supple.  Chest is clear to auscultation with normal expansion.  Cardiovascular exam is regular rate and rhythm.  Abdominal exam nontender or  distended. No masses palpated. Extremities show no edema. neuro grossly intact  ECG normal sinus rhythm at a rate of 62. Right bundle branch block. Left anterior fascicular block.

## 2015-03-18 ENCOUNTER — Ambulatory Visit (INDEPENDENT_AMBULATORY_CARE_PROVIDER_SITE_OTHER): Payer: Medicare Other | Admitting: Cardiology

## 2015-03-18 ENCOUNTER — Encounter: Payer: Self-pay | Admitting: Cardiology

## 2015-03-18 VITALS — BP 128/66 | HR 62 | Ht 72.0 in | Wt 219.0 lb

## 2015-03-18 DIAGNOSIS — I10 Essential (primary) hypertension: Secondary | ICD-10-CM

## 2015-03-18 DIAGNOSIS — I739 Peripheral vascular disease, unspecified: Secondary | ICD-10-CM | POA: Diagnosis not present

## 2015-03-18 DIAGNOSIS — E785 Hyperlipidemia, unspecified: Secondary | ICD-10-CM

## 2015-03-18 DIAGNOSIS — I2510 Atherosclerotic heart disease of native coronary artery without angina pectoris: Secondary | ICD-10-CM

## 2015-03-18 DIAGNOSIS — I2583 Coronary atherosclerosis due to lipid rich plaque: Secondary | ICD-10-CM

## 2015-03-18 NOTE — Assessment & Plan Note (Signed)
Blood pressure controlled. 

## 2015-03-18 NOTE — Assessment & Plan Note (Signed)
Continue statin. 

## 2015-03-18 NOTE — Assessment & Plan Note (Signed)
Patient denies claudication. Continue medical therapy with aspirin and statin.

## 2015-03-18 NOTE — Assessment & Plan Note (Signed)
Continue aspirin and statin. Last nuclear study showed no ischemia.

## 2015-03-18 NOTE — Patient Instructions (Signed)
Your physician wants you to follow-up in: ONE YEAR WITH DR CRENSHAW You will receive a reminder letter in the mail two months in advance. If you don't receive a letter, please call our office to schedule the follow-up appointment.  

## 2015-03-21 ENCOUNTER — Ambulatory Visit (HOSPITAL_BASED_OUTPATIENT_CLINIC_OR_DEPARTMENT_OTHER)
Admission: RE | Admit: 2015-03-21 | Discharge: 2015-03-21 | Disposition: A | Discharge status: Home / Self Care | Payer: Medicare Other | Source: Ambulatory Visit | Attending: Family Medicine | Admitting: Family Medicine

## 2015-03-21 DIAGNOSIS — M5126 Other intervertebral disc displacement, lumbar region: Secondary | ICD-10-CM | POA: Diagnosis not present

## 2015-03-21 DIAGNOSIS — M4806 Spinal stenosis, lumbar region: Secondary | ICD-10-CM | POA: Diagnosis not present

## 2015-03-21 DIAGNOSIS — M5442 Lumbago with sciatica, left side: Secondary | ICD-10-CM | POA: Diagnosis not present

## 2015-03-30 ENCOUNTER — Other Ambulatory Visit (HOSPITAL_COMMUNITY): Payer: Self-pay | Admitting: Neurosurgery

## 2015-03-30 DIAGNOSIS — Z6829 Body mass index (BMI) 29.0-29.9, adult: Secondary | ICD-10-CM | POA: Diagnosis not present

## 2015-03-30 DIAGNOSIS — M5127 Other intervertebral disc displacement, lumbosacral region: Secondary | ICD-10-CM | POA: Diagnosis not present

## 2015-03-30 DIAGNOSIS — M4806 Spinal stenosis, lumbar region: Secondary | ICD-10-CM | POA: Diagnosis not present

## 2015-04-08 ENCOUNTER — Encounter (HOSPITAL_COMMUNITY)
Admission: RE | Admit: 2015-04-08 | Discharge: 2015-04-08 | Disposition: A | Discharge status: Home / Self Care | Payer: Medicare Other | Source: Ambulatory Visit | Attending: Neurosurgery | Admitting: Neurosurgery

## 2015-04-08 ENCOUNTER — Encounter (HOSPITAL_COMMUNITY): Payer: Self-pay

## 2015-04-08 DIAGNOSIS — E039 Hypothyroidism, unspecified: Secondary | ICD-10-CM | POA: Diagnosis not present

## 2015-04-08 DIAGNOSIS — M4806 Spinal stenosis, lumbar region: Secondary | ICD-10-CM | POA: Diagnosis not present

## 2015-04-08 DIAGNOSIS — I10 Essential (primary) hypertension: Secondary | ICD-10-CM | POA: Diagnosis not present

## 2015-04-08 DIAGNOSIS — I739 Peripheral vascular disease, unspecified: Secondary | ICD-10-CM | POA: Diagnosis not present

## 2015-04-08 DIAGNOSIS — Z955 Presence of coronary angioplasty implant and graft: Secondary | ICD-10-CM | POA: Diagnosis not present

## 2015-04-08 DIAGNOSIS — E119 Type 2 diabetes mellitus without complications: Secondary | ICD-10-CM | POA: Diagnosis not present

## 2015-04-08 DIAGNOSIS — Z87891 Personal history of nicotine dependence: Secondary | ICD-10-CM | POA: Diagnosis not present

## 2015-04-08 DIAGNOSIS — M5126 Other intervertebral disc displacement, lumbar region: Secondary | ICD-10-CM | POA: Diagnosis not present

## 2015-04-08 DIAGNOSIS — I2510 Atherosclerotic heart disease of native coronary artery without angina pectoris: Secondary | ICD-10-CM | POA: Diagnosis not present

## 2015-04-08 LAB — BASIC METABOLIC PANEL
Anion gap: 10 (ref 5–15)
BUN: 12 mg/dL (ref 6–23)
CO2: 24 mmol/L (ref 19–32)
Calcium: 9.2 mg/dL (ref 8.4–10.5)
Chloride: 102 mmol/L (ref 96–112)
Creatinine, Ser: 1.18 mg/dL (ref 0.50–1.35)
GFR calc Af Amer: 69 mL/min — ABNORMAL LOW (ref >90)
GFR calc non Af Amer: 60 mL/min — ABNORMAL LOW (ref >90)
Glucose, Bld: 184 mg/dL — ABNORMAL HIGH (ref 70–99)
Potassium: 4 mmol/L (ref 3.5–5.1)
Sodium: 136 mmol/L (ref 135–145)

## 2015-04-08 LAB — CBC
HCT: 43.2 % (ref 39.0–52.0)
Hemoglobin: 14.4 g/dL (ref 13.0–17.0)
MCH: 28 pg (ref 26.0–34.0)
MCHC: 33.3 g/dL (ref 30.0–36.0)
MCV: 84 fL (ref 78.0–100.0)
Platelets: 166 10*3/uL (ref 150–400)
RBC: 5.14 MIL/uL (ref 4.22–5.81)
RDW: 13.3 % (ref 11.5–15.5)
WBC: 7.3 10*3/uL (ref 4.0–10.5)

## 2015-04-08 LAB — SURGICAL PCR SCREEN
MRSA, PCR: NEGATIVE
Staphylococcus aureus: NEGATIVE

## 2015-04-08 NOTE — Progress Notes (Signed)
Lov with Dr. Stanford Breed was 03/18/2015.

## 2015-04-09 MED ORDER — CEFAZOLIN SODIUM-DEXTROSE 2-3 GM-% IV SOLR
2.0000 g | INTRAVENOUS | Status: AC
  Administered 2015-04-10: 2 g via INTRAVENOUS
  Filled 2015-04-09: qty 50

## 2015-04-10 ENCOUNTER — Ambulatory Visit (HOSPITAL_COMMUNITY): Payer: Medicare Other | Admitting: Anesthesiology

## 2015-04-10 ENCOUNTER — Ambulatory Visit (HOSPITAL_COMMUNITY): Payer: Medicare Other

## 2015-04-10 ENCOUNTER — Encounter (HOSPITAL_COMMUNITY)
Admission: RE | Disposition: A | Discharge status: Home / Self Care | Payer: Self-pay | Source: Ambulatory Visit | Attending: Neurosurgery

## 2015-04-10 ENCOUNTER — Encounter (HOSPITAL_COMMUNITY): Payer: Self-pay | Admitting: *Deleted

## 2015-04-10 ENCOUNTER — Ambulatory Visit (HOSPITAL_COMMUNITY)
Admission: RE | Admit: 2015-04-10 | Discharge: 2015-04-11 | Disposition: A | Discharge status: Home / Self Care | Payer: Medicare Other | Source: Ambulatory Visit | Attending: Neurosurgery | Admitting: Neurosurgery

## 2015-04-10 DIAGNOSIS — Z955 Presence of coronary angioplasty implant and graft: Secondary | ICD-10-CM | POA: Insufficient documentation

## 2015-04-10 DIAGNOSIS — E119 Type 2 diabetes mellitus without complications: Secondary | ICD-10-CM | POA: Insufficient documentation

## 2015-04-10 DIAGNOSIS — E039 Hypothyroidism, unspecified: Secondary | ICD-10-CM | POA: Insufficient documentation

## 2015-04-10 DIAGNOSIS — M5126 Other intervertebral disc displacement, lumbar region: Secondary | ICD-10-CM | POA: Diagnosis present

## 2015-04-10 DIAGNOSIS — I739 Peripheral vascular disease, unspecified: Secondary | ICD-10-CM | POA: Diagnosis not present

## 2015-04-10 DIAGNOSIS — Z87891 Personal history of nicotine dependence: Secondary | ICD-10-CM | POA: Diagnosis not present

## 2015-04-10 DIAGNOSIS — Z4789 Encounter for other orthopedic aftercare: Secondary | ICD-10-CM | POA: Diagnosis not present

## 2015-04-10 DIAGNOSIS — M4806 Spinal stenosis, lumbar region: Secondary | ICD-10-CM | POA: Diagnosis not present

## 2015-04-10 DIAGNOSIS — M5127 Other intervertebral disc displacement, lumbosacral region: Secondary | ICD-10-CM | POA: Diagnosis not present

## 2015-04-10 DIAGNOSIS — Z981 Arthrodesis status: Secondary | ICD-10-CM | POA: Diagnosis not present

## 2015-04-10 DIAGNOSIS — I2510 Atherosclerotic heart disease of native coronary artery without angina pectoris: Secondary | ICD-10-CM | POA: Diagnosis not present

## 2015-04-10 DIAGNOSIS — I10 Essential (primary) hypertension: Secondary | ICD-10-CM | POA: Insufficient documentation

## 2015-04-10 DIAGNOSIS — Z419 Encounter for procedure for purposes other than remedying health state, unspecified: Secondary | ICD-10-CM

## 2015-04-10 HISTORY — PX: LUMBAR LAMINECTOMY/DECOMPRESSION MICRODISCECTOMY: SHX5026

## 2015-04-10 LAB — GLUCOSE, CAPILLARY
Glucose-Capillary: 114 mg/dL — ABNORMAL HIGH (ref 70–99)
Glucose-Capillary: 109 mg/dL — ABNORMAL HIGH (ref 70–99)
Glucose-Capillary: 161 mg/dL — ABNORMAL HIGH (ref 70–99)

## 2015-04-10 SURGERY — LUMBAR LAMINECTOMY/DECOMPRESSION MICRODISCECTOMY 2 LEVELS
Anesthesia: General | Site: Back | Laterality: Bilateral

## 2015-04-10 MED ORDER — DOCUSATE SODIUM 100 MG PO CAPS: 100.0000 mg | ORAL_CAPSULE | Freq: Two times a day (BID) | ORAL | Status: DC

## 2015-04-10 MED ORDER — KETOROLAC TROMETHAMINE 30 MG/ML IJ SOLN
30.0000 mg | Freq: Four times a day (QID) | INTRAMUSCULAR | Status: DC
  Administered 2015-04-11: 30 mg via INTRAVENOUS
  Filled 2015-04-10 (×4): qty 1

## 2015-04-10 MED ORDER — GLYCOPYRROLATE 0.2 MG/ML IJ SOLN
INTRAMUSCULAR | Status: DC | PRN
  Administered 2015-04-10: 0.4 mg via INTRAVENOUS

## 2015-04-10 MED ORDER — HYDROMORPHONE HCL 1 MG/ML IJ SOLN
0.2500 mg | INTRAMUSCULAR | Status: DC | PRN
  Administered 2015-04-10: 0.25 mg via INTRAVENOUS

## 2015-04-10 MED ORDER — LIDOCAINE HCL (CARDIAC) 20 MG/ML IV SOLN
INTRAVENOUS | Status: DC | PRN
  Administered 2015-04-10: 100 mg via INTRAVENOUS

## 2015-04-10 MED ORDER — LINAGLIPTIN 5 MG PO TABS
5.0000 mg | ORAL_TABLET | Freq: Every day | ORAL | Status: DC
  Administered 2015-04-11: 5 mg via ORAL
  Filled 2015-04-10: qty 1

## 2015-04-10 MED ORDER — PROPOFOL 10 MG/ML IV BOLUS
INTRAVENOUS | Status: DC | PRN
  Administered 2015-04-10: 150 mg via INTRAVENOUS

## 2015-04-10 MED ORDER — HEMOSTATIC AGENTS (NO CHARGE) OPTIME
TOPICAL | Status: DC | PRN
  Administered 2015-04-10: 1 via TOPICAL

## 2015-04-10 MED ORDER — SODIUM CHLORIDE 0.9 % IJ SOLN: 3.0000 mL | INTRAMUSCULAR | Status: DC | PRN

## 2015-04-10 MED ORDER — 0.9 % SODIUM CHLORIDE (POUR BTL) OPTIME
TOPICAL | Status: DC | PRN
  Administered 2015-04-10: 1000 mL

## 2015-04-10 MED ORDER — INSULIN ASPART 100 UNIT/ML ~~LOC~~ SOLN
0.0000 [IU] | Freq: Three times a day (TID) | SUBCUTANEOUS | Status: DC
  Administered 2015-04-11: 3 [IU] via SUBCUTANEOUS

## 2015-04-10 MED ORDER — FENTANYL CITRATE (PF) 250 MCG/5ML IJ SOLN
INTRAMUSCULAR | Status: AC
  Filled 2015-04-10: qty 5

## 2015-04-10 MED ORDER — MAGNESIUM CITRATE PO SOLN
1.0000 | Freq: Once | ORAL | Status: AC | PRN
  Filled 2015-04-10: qty 296

## 2015-04-10 MED ORDER — NEOSTIGMINE METHYLSULFATE 10 MG/10ML IV SOLN
INTRAVENOUS | Status: DC | PRN
  Administered 2015-04-10: 3 mg via INTRAVENOUS

## 2015-04-10 MED ORDER — MENTHOL 3 MG MT LOZG
1.0000 | LOZENGE | OROMUCOSAL | Status: DC | PRN
Start: 2015-04-10 — End: 2015-04-11

## 2015-04-10 MED ORDER — LIDOCAINE HCL (CARDIAC) 20 MG/ML IV SOLN
INTRAVENOUS | Status: AC
  Filled 2015-04-10: qty 5

## 2015-04-10 MED ORDER — EPHEDRINE SULFATE 50 MG/ML IJ SOLN
INTRAMUSCULAR | Status: DC | PRN
  Administered 2015-04-10: 10 mg via INTRAVENOUS

## 2015-04-10 MED ORDER — SODIUM CHLORIDE 0.9 % IJ SOLN: 3.0000 mL | Freq: Two times a day (BID) | INTRAMUSCULAR | Status: DC

## 2015-04-10 MED ORDER — GLYCOPYRROLATE 0.2 MG/ML IJ SOLN
INTRAMUSCULAR | Status: AC
  Filled 2015-04-10: qty 2

## 2015-04-10 MED ORDER — ROCURONIUM BROMIDE 100 MG/10ML IV SOLN
INTRAVENOUS | Status: DC | PRN
  Administered 2015-04-10: 40 mg via INTRAVENOUS

## 2015-04-10 MED ORDER — HYDROCODONE-ACETAMINOPHEN 5-325 MG PO TABS: 1.0000 | ORAL_TABLET | ORAL | Status: DC | PRN

## 2015-04-10 MED ORDER — NEOSTIGMINE METHYLSULFATE 10 MG/10ML IV SOLN
INTRAVENOUS | Status: AC
  Filled 2015-04-10: qty 1

## 2015-04-10 MED ORDER — PHENOL 1.4 % MT LIQD: 1.0000 | OROMUCOSAL | Status: DC | PRN

## 2015-04-10 MED ORDER — PROPOFOL 10 MG/ML IV BOLUS
INTRAVENOUS | Status: AC
  Filled 2015-04-10: qty 20

## 2015-04-10 MED ORDER — ONDANSETRON HCL 4 MG/2ML IJ SOLN: 4.0000 mg | INTRAMUSCULAR | Status: DC | PRN

## 2015-04-10 MED ORDER — LACTATED RINGERS IV SOLN
INTRAVENOUS | Status: DC | PRN
  Administered 2015-04-10 (×3): via INTRAVENOUS

## 2015-04-10 MED ORDER — ONDANSETRON HCL 4 MG/2ML IJ SOLN
INTRAMUSCULAR | Status: AC
  Filled 2015-04-10: qty 2

## 2015-04-10 MED ORDER — METFORMIN HCL ER 500 MG PO TB24
1000.0000 mg | ORAL_TABLET | Freq: Two times a day (BID) | ORAL | Status: DC
  Administered 2015-04-11: 1000 mg via ORAL
  Filled 2015-04-10 (×3): qty 2

## 2015-04-10 MED ORDER — SENNOSIDES-DOCUSATE SODIUM 8.6-50 MG PO TABS
1.0000 | ORAL_TABLET | Freq: Every evening | ORAL | Status: DC | PRN
  Filled 2015-04-10: qty 1

## 2015-04-10 MED ORDER — SIMVASTATIN 40 MG PO TABS
40.0000 mg | ORAL_TABLET | Freq: Every day | ORAL | Status: DC
  Administered 2015-04-11: 40 mg via ORAL
  Filled 2015-04-10: qty 1

## 2015-04-10 MED ORDER — DEXAMETHASONE SODIUM PHOSPHATE 4 MG/ML IJ SOLN
INTRAMUSCULAR | Status: DC | PRN
  Administered 2015-04-10: 4 mg via INTRAVENOUS

## 2015-04-10 MED ORDER — INSULIN ASPART 100 UNIT/ML ~~LOC~~ SOLN: 0.0000 [IU] | Freq: Every day | SUBCUTANEOUS | Status: DC

## 2015-04-10 MED ORDER — THROMBIN 5000 UNITS EX SOLR
CUTANEOUS | Status: DC | PRN
  Administered 2015-04-10 (×2): 5000 [IU] via TOPICAL

## 2015-04-10 MED ORDER — MIDAZOLAM HCL 2 MG/2ML IJ SOLN
INTRAMUSCULAR | Status: AC
  Filled 2015-04-10: qty 2

## 2015-04-10 MED ORDER — FENTANYL CITRATE (PF) 100 MCG/2ML IJ SOLN
INTRAMUSCULAR | Status: DC | PRN
  Administered 2015-04-10 (×3): 50 ug via INTRAVENOUS
  Administered 2015-04-10: 100 ug via INTRAVENOUS

## 2015-04-10 MED ORDER — DIAZEPAM 5 MG PO TABS: 5.0000 mg | ORAL_TABLET | Freq: Four times a day (QID) | ORAL | Status: DC | PRN

## 2015-04-10 MED ORDER — ACETAMINOPHEN 325 MG PO TABS: 650.0000 mg | ORAL_TABLET | ORAL | Status: DC | PRN

## 2015-04-10 MED ORDER — ROCURONIUM BROMIDE 50 MG/5ML IV SOLN
INTRAVENOUS | Status: AC
  Filled 2015-04-10: qty 1

## 2015-04-10 MED ORDER — GLIPIZIDE 10 MG PO TABS
10.0000 mg | ORAL_TABLET | Freq: Two times a day (BID) | ORAL | Status: DC
  Administered 2015-04-11: 10 mg via ORAL
  Filled 2015-04-10 (×3): qty 1

## 2015-04-10 MED ORDER — BISACODYL 5 MG PO TBEC
5.0000 mg | DELAYED_RELEASE_TABLET | Freq: Every day | ORAL | Status: DC | PRN
  Filled 2015-04-10: qty 1

## 2015-04-10 MED ORDER — EPHEDRINE SULFATE 50 MG/ML IJ SOLN
INTRAMUSCULAR | Status: AC
  Filled 2015-04-10: qty 1

## 2015-04-10 MED ORDER — HYDROMORPHONE HCL 1 MG/ML IJ SOLN: 0.5000 mg | INTRAMUSCULAR | Status: DC | PRN

## 2015-04-10 MED ORDER — HYDROMORPHONE HCL 1 MG/ML IJ SOLN
INTRAMUSCULAR | Status: AC
  Filled 2015-04-10: qty 1

## 2015-04-10 MED ORDER — POTASSIUM CHLORIDE IN NACL 20-0.9 MEQ/L-% IV SOLN
INTRAVENOUS | Status: DC
  Filled 2015-04-10 (×3): qty 1000

## 2015-04-10 MED ORDER — LACTATED RINGERS IV SOLN
INTRAVENOUS | Status: DC
  Administered 2015-04-10: 14:00:00 via INTRAVENOUS

## 2015-04-10 MED ORDER — ASPIRIN EC 81 MG PO TBEC
81.0000 mg | DELAYED_RELEASE_TABLET | Freq: Every day | ORAL | Status: DC
  Administered 2015-04-11: 81 mg via ORAL
  Filled 2015-04-10: qty 1

## 2015-04-10 MED ORDER — SODIUM CHLORIDE 0.9 % IJ SOLN
INTRAMUSCULAR | Status: AC
  Filled 2015-04-10: qty 10

## 2015-04-10 MED ORDER — OXYCODONE-ACETAMINOPHEN 5-325 MG PO TABS: 1.0000 | ORAL_TABLET | ORAL | Status: DC | PRN

## 2015-04-10 MED ORDER — ACETAMINOPHEN 650 MG RE SUPP
650.0000 mg | RECTAL | Status: DC | PRN
Start: 2015-04-10 — End: 2015-04-11

## 2015-04-10 MED ORDER — MIDAZOLAM HCL 5 MG/5ML IJ SOLN
INTRAMUSCULAR | Status: DC | PRN
  Administered 2015-04-10: 2 mg via INTRAVENOUS

## 2015-04-10 MED ORDER — LEVOTHYROXINE SODIUM 100 MCG PO TABS
100.0000 ug | ORAL_TABLET | Freq: Every day | ORAL | Status: DC
  Administered 2015-04-11: 100 ug via ORAL
  Filled 2015-04-10 (×2): qty 1

## 2015-04-10 MED ORDER — ONDANSETRON HCL 4 MG/2ML IJ SOLN
INTRAMUSCULAR | Status: DC | PRN
  Administered 2015-04-10: 4 mg via INTRAVENOUS

## 2015-04-10 MED ORDER — SODIUM CHLORIDE 0.9 % IV SOLN: 250.0000 mL | INTRAVENOUS | Status: DC

## 2015-04-10 MED ORDER — ZOLPIDEM TARTRATE 5 MG PO TABS: 5.0000 mg | ORAL_TABLET | Freq: Every evening | ORAL | Status: DC | PRN

## 2015-04-10 MED ORDER — ARTIFICIAL TEARS OP OINT
TOPICAL_OINTMENT | OPHTHALMIC | Status: AC
  Filled 2015-04-10: qty 3.5

## 2015-04-10 MED ORDER — BUPIVACAINE HCL (PF) 0.5 % IJ SOLN
INTRAMUSCULAR | Status: DC | PRN
  Administered 2015-04-10: 20 mL

## 2015-04-10 SURGICAL SUPPLY — 52 items
BAG DECANTER FOR FLEXI CONT (MISCELLANEOUS) ×2 IMPLANT
BENZOIN TINCTURE PRP APPL 2/3 (GAUZE/BANDAGES/DRESSINGS) IMPLANT
BLADE CLIPPER SURG (BLADE) IMPLANT
BUR MATCHSTICK NEURO 3.0 LAGG (BURR) ×2 IMPLANT
CANISTER SUCT 3000ML PPV (MISCELLANEOUS) ×2 IMPLANT
CONT SPEC 4OZ CLIKSEAL STRL BL (MISCELLANEOUS) ×2 IMPLANT
DECANTER SPIKE VIAL GLASS SM (MISCELLANEOUS) ×2 IMPLANT
DERMABOND ADVANCED (GAUZE/BANDAGES/DRESSINGS) ×1
DERMABOND ADVANCED .7 DNX12 (GAUZE/BANDAGES/DRESSINGS) ×1 IMPLANT
DRAPE LAPAROTOMY 100X72X124 (DRAPES) ×2 IMPLANT
DRAPE MICROSCOPE LEICA (MISCELLANEOUS) ×2 IMPLANT
DRAPE POUCH INSTRU U-SHP 10X18 (DRAPES) ×2 IMPLANT
DRAPE SURG 17X23 STRL (DRAPES) ×2 IMPLANT
DRSG OPSITE POSTOP 4X6 (GAUZE/BANDAGES/DRESSINGS) ×2 IMPLANT
DURAPREP 26ML APPLICATOR (WOUND CARE) ×2 IMPLANT
ELECT REM PT RETURN 9FT ADLT (ELECTROSURGICAL) ×2
ELECTRODE REM PT RTRN 9FT ADLT (ELECTROSURGICAL) ×1 IMPLANT
GAUZE SPONGE 4X4 12PLY STRL (GAUZE/BANDAGES/DRESSINGS) IMPLANT
GAUZE SPONGE 4X4 16PLY XRAY LF (GAUZE/BANDAGES/DRESSINGS) IMPLANT
GLOVE BIO SURGEON STRL SZ 6.5 (GLOVE) ×8 IMPLANT
GLOVE BIOGEL M 7.0 STRL (GLOVE) ×2 IMPLANT
GLOVE ECLIPSE 6.5 STRL STRAW (GLOVE) ×2 IMPLANT
GLOVE EXAM NITRILE LRG STRL (GLOVE) IMPLANT
GLOVE EXAM NITRILE MD LF STRL (GLOVE) IMPLANT
GLOVE EXAM NITRILE XL STR (GLOVE) IMPLANT
GLOVE EXAM NITRILE XS STR PU (GLOVE) IMPLANT
GLOVE INDICATOR 6.5 STRL GRN (GLOVE) ×6 IMPLANT
GOWN STRL REUS W/ TWL LRG LVL3 (GOWN DISPOSABLE) ×4 IMPLANT
GOWN STRL REUS W/ TWL XL LVL3 (GOWN DISPOSABLE) IMPLANT
GOWN STRL REUS W/TWL 2XL LVL3 (GOWN DISPOSABLE) IMPLANT
GOWN STRL REUS W/TWL LRG LVL3 (GOWN DISPOSABLE) ×4
GOWN STRL REUS W/TWL XL LVL3 (GOWN DISPOSABLE)
KIT BASIN OR (CUSTOM PROCEDURE TRAY) ×2 IMPLANT
KIT ROOM TURNOVER OR (KITS) ×2 IMPLANT
LIQUID BAND (GAUZE/BANDAGES/DRESSINGS) ×2 IMPLANT
NEEDLE HYPO 25X1 1.5 SAFETY (NEEDLE) ×2 IMPLANT
NEEDLE SPNL 18GX3.5 QUINCKE PK (NEEDLE) IMPLANT
NS IRRIG 1000ML POUR BTL (IV SOLUTION) ×2 IMPLANT
PACK LAMINECTOMY NEURO (CUSTOM PROCEDURE TRAY) ×2 IMPLANT
PAD ARMBOARD 7.5X6 YLW CONV (MISCELLANEOUS) ×6 IMPLANT
RUBBERBAND STERILE (MISCELLANEOUS) ×4 IMPLANT
SPONGE LAP 4X18 X RAY DECT (DISPOSABLE) IMPLANT
SPONGE SURGIFOAM ABS GEL SZ50 (HEMOSTASIS) ×2 IMPLANT
STRIP CLOSURE SKIN 1/2X4 (GAUZE/BANDAGES/DRESSINGS) IMPLANT
SUT VIC AB 0 CT1 18XCR BRD8 (SUTURE) ×1 IMPLANT
SUT VIC AB 0 CT1 8-18 (SUTURE) ×1
SUT VIC AB 2-0 CT1 18 (SUTURE) ×4 IMPLANT
SUT VIC AB 3-0 SH 8-18 (SUTURE) ×4 IMPLANT
SYR 20ML ECCENTRIC (SYRINGE) ×2 IMPLANT
TOWEL OR 17X24 6PK STRL BLUE (TOWEL DISPOSABLE) ×2 IMPLANT
TOWEL OR 17X26 10 PK STRL BLUE (TOWEL DISPOSABLE) ×2 IMPLANT
WATER STERILE IRR 1000ML POUR (IV SOLUTION) ×2 IMPLANT

## 2015-04-10 NOTE — Transfer of Care (Signed)
Immediate Anesthesia Transfer of Care Note  Patient: Jack Wade  Procedure(s) Performed: Procedure(s) with comments: LUMBAR LAMINECTOMY/DECOMPRESSION MICRODISCECTOMY 2 LEVELS (Bilateral) - Bilateral L45 L5S1 Laminectomy and Foraminotomy  Patient Location: PACU  Anesthesia Type:General  Level of Consciousness: sedated, patient cooperative and responds to stimulation  Airway & Oxygen Therapy: Patient Spontanous Breathing and Patient connected to nasal cannula oxygen  Post-op Assessment: Report given to RN, Post -op Vital signs reviewed and stable and Patient moving all extremities X 4  Post vital signs: Reviewed and stable  Last Vitals:  Filed Vitals:   04/10/15 2147  BP: 128/66  Pulse: 65  Temp: 36.4 C  Resp: 19    Complications: No apparent anesthesia complications

## 2015-04-10 NOTE — H&P (Signed)
BP 165/69 mmHg  Pulse 56  Temp(Src) 97.5 F (36.4 C) (Oral)  Resp 20  Ht 6' (1.829 m)  Wt 95.255 kg (210 lb)  BMI 28.47 kg/m2  SpO2 100%    [name] is a 74 year old gentleman who presents today for evaluation of pain that he has in his back and lower extremity.  He has been hurting now for approximately a year and it has gotten worse.  He did not seek any treatment for 10 months at least, and only in the last few months did he ask his physician, Dr. Charlett Wade, to pursue a workup.  He has no specific antecedent trauma that he can remember.  He did have two distant lumbar laminectomies 20 and 17 years ago to the best of his recollection.  He has a great deal of trouble when he is asleep.  Going asleep it will wake him up at times, and trying to get back to sleep, with pain in his back and left lower extremity.  He, since the surgery, has had problems with strength in his left plantar flexors and strength in his hip flexors.  He has to lift his leg up with his upper extremities to get in and out of a car, for example.  He had problems going up and down stairs.  If he sits in a car for any length of time, that makes it harder to get in and out secondary to pain and weakness.  He has had no bowel or bladder dysfunction.  He is a retired Land.  He has a history of diabetes and melanoma.  He is right-handed.     REVIEW OF SYSTEMS:                                    Review of systems is positive for eyeglasses, leg pain with walking, broken bones, back pain, leg pain at rest, skin cancer, diabetes, and thyroid disease.  He denies constitutional, ear, nose, mouth, throat, respiratory, gastrointestinal, genitourinary, neurological, psychiatric, hematologic, and allergic problems.  On his  pain chart, he lists pain in his back and left lower extremity and some on the right.  PAST MEDICAL HISTORY:                                             Prior Operations:  He has had no other  surgery besides the two laminectomies.              Medications and Allergies:  He has no known drug allergies.     FAMILY HISTORY:                                            Mother and father both deceased.  Diabetes present in the family history.      SOCIAL HISTORY:                                            He does not smoke.  He does not use alcohol.  He does not use illicit drugs.  PHYSICAL EXAMINATION:                                Vital signs:  Height 6 feet, weight 216.4 pounds, blood pressure is 135/71, pulse 61, respiratory rate 14, temperature is 97.5.  Pain 3/10.     NEUROLOGICAL EXAMINATION:                       On physical exam, he is alert, oriented x4 and answers all questions appropriately.  He is 5/5 in the upper extremities.  He has weakness in the left hip flexors and plantar flexors.  He is unable to do a single toe raiser on the left side and says that has been the case ever since his very first operation.  Reflexes are 2+ at the knees, right ankle, biceps, triceps, brachioradialis.  No reflex elicited at the left ankle.  He can heel walk bilaterally, but he does have some weakness in dorsiflexion.  Memory, language, attention span and fund of knowledge is normal.  Pupils equal, round and reactive to light.  Full extraocular movements.  Full visual fields.  Hearing intact to voice.  Symmetric facial sensation and movement.  Uvula elevates in the midline.  Shoulder shrug is normal.  Tongue protrudes in the midline.  He has an antalgic gait and does limp on the left side.  That has been a longstanding limp, according to both him and his wife, which has progressed over the last year.  Romberg is negative.     IMAGING STUDIES:                                          MRI shows severe stenosis at L4-5, facet hypertrophy, ligamentous hypertrophy, minor disk bulge and a recurrent disk at L5-S1, seemingly eccentric to the left side.  This was not done with contrast so it  is impossible to know if this is scar tissue.     IMPRESSION:                                                   Jack Wade was unwilling to try injections as he says he has had them in the past with absolutely no relief.  He did not wish to pursue physical therapy because he says he has done this in the past without relief.  He would like to pursue operative treatment if indicated, and I told him I think we can do this.  I will have him undergo a lumbar laminectomy for decompression at L4-5 and a redo diskectomy at L5-S1.  Risks and benefits, bleeding, infection, no relief, need for further surgery, disk recurrence, possible need for fusion in the future were discussed at great length.  He understands and wishes to proceed.  I gave him a detailed instruction sheet with regards to the operation.

## 2015-04-10 NOTE — Op Note (Signed)
04/10/2015  10:18 PM  PATIENT:  Jack Wade  73 y.o. male with neurogenic claudication, and a possible recurrent herniated disc at L5/s1 on the left. He has not responded to conservative measures and will be taken to the operating room for decompression of the L4/5 space, and a redo discetomy at L5/S1  PRE-OPERATIVE DIAGNOSIS:  lumbar spinal stenosis  POST-OPERATIVE DIAGNOSIS:  lumbar spinal stenosis  PROCEDURE:  Procedure(s): LUMBAR LAMINECTOMY/DECOMPRESSION L4/5, L5 roots bilaterally Left L5/S1redo discetomy SURGEON: Surgeon(s): Ashok Pall, MD Kary Kos, MD  ASSISTANTS:Cram, Dominica Severin  ANESTHESIA:   general  EBL:  Total I/O In: 1300 [I.V.:1300] Out: -   BLOOD ADMINISTERED:none  CELL SAVER GIVEN:none  COUNT:per nursing  DRAINS: none   SPECIMEN:  No Specimen  DICTATION: Jack Wade was taken to the operating room, intubated, and placed under a general anesthetic without difficulty.  He was positioned prone on a Wilson frame with all pressure points properly padded. His back was prepared and draped in a sterile manner. I opened a portion of the old incision to expose the L4, L5, and S1 lamina. I used both sharp dissection and monopolar cautery to do the exposure.  I decompressed the spinal canal and the L5 roots via bilateral semihemilaminectomies of L4. I exposed the ligamentum flavum, and removed it with Kerrison punches without difficulty. With the thecal sac now exposed I worked with both the drill, and the punches to perform L5 foraminotomies bilaterally. The ligament was removed so that both sides communicated beneath the spinous process of L4.and there was no compression from bone or ligament.  I, with Dr. Windy Carina assistance, performed a redo discetomy at L5/S1 on the left side. I used the drill to remove more of the L5 lamina until I was rostral to the scar tissue. I used the Kerrison punch to remove ligament and scar finally exposing the thecal sac. Using  microdissection, I freed the anterior portion of the dura from the disc space. I entered the disc space with a Hotel manager. Using nerve hooks, blunt dissectors, and pituitary rongeurs I was able to enter the L5/S1 disc space and remove a small amount of disc material. There was a large calcified midline ridge which was probably involving disc also. The S1 root had free egress after the discetomy.  I inspected the L4/5 space, and the discetomy once more and was satisfied with the decompression. I irrigated then closed the wound with vicryl sutures in layered fashion approximating the thoracolumbar fascia, and the subcutaneous, and subcuticular planes. Dermabond was applied, along with an occlusive bandage.  PLAN OF CARE: Admit for overnight observation  PATIENT DISPOSITION:  PACU - hemodynamically stable.   Delay start of Pharmacological VTE agent (>24hrs) due to surgical blood loss or risk of bleeding:  yes

## 2015-04-10 NOTE — Plan of Care (Signed)
Problem: Consults Goal: Diagnosis - Spinal Surgery Outcome: Completed/Met Date Met:  04/10/15 Lumbar Laminectomy (Complex)

## 2015-04-10 NOTE — Anesthesia Procedure Notes (Signed)
Procedure Name: Intubation Date/Time: 04/10/2015 6:57 PM Performed by: Claris Che Pre-anesthesia Checklist: Patient identified, Emergency Drugs available, Suction available, Patient being monitored and Timeout performed Patient Re-evaluated:Patient Re-evaluated prior to inductionOxygen Delivery Method: Circle system utilized Preoxygenation: Pre-oxygenation with 100% oxygen Intubation Type: IV induction Ventilation: Mask ventilation without difficulty Laryngoscope Size: Mac and 3 Grade View: Grade II Tube type: Oral Tube size: 7.5 mm Airway Equipment and Method: Stylet Placement Confirmation: ETT inserted through vocal cords under direct vision,  positive ETCO2 and breath sounds checked- equal and bilateral Secured at: 23 cm Tube secured with: Tape Dental Injury: Teeth and Oropharynx as per pre-operative assessment

## 2015-04-10 NOTE — Anesthesia Preprocedure Evaluation (Addendum)
Anesthesia Evaluation  Patient identified by MRN, date of birth, ID band Patient awake    Reviewed: Allergy & Precautions, H&P , NPO status , Patient's Chart, lab work & pertinent test results  Airway Mallampati: II  TM Distance: >3 FB Neck ROM: Full    Dental no notable dental hx. (+) Dental Advisory Given, Teeth Intact   Pulmonary neg pulmonary ROS, former smoker,  breath sounds clear to auscultation  Pulmonary exam normal       Cardiovascular hypertension, + CAD, + Cardiac Stents and + Peripheral Vascular Disease Rhythm:Regular Rate:Normal     Neuro/Psych negative neurological ROS  negative psych ROS   GI/Hepatic negative GI ROS, Neg liver ROS,   Endo/Other  diabetes, Type 2, Oral Hypoglycemic AgentsHypothyroidism   Renal/GU negative Renal ROS  negative genitourinary   Musculoskeletal   Abdominal   Peds  Hematology negative hematology ROS (+)   Anesthesia Other Findings   Reproductive/Obstetrics negative OB ROS                          Anesthesia Physical Anesthesia Plan  ASA: III  Anesthesia Plan: General   Post-op Pain Management:    Induction: Intravenous  Airway Management Planned: Oral ETT  Additional Equipment:   Intra-op Plan:   Post-operative Plan: Extubation in OR  Informed Consent: I have reviewed the patients History and Physical, chart, labs and discussed the procedure including the risks, benefits and alternatives for the proposed anesthesia with the patient or authorized representative who has indicated his/her understanding and acceptance.   Dental advisory given  Plan Discussed with: CRNA  Anesthesia Plan Comments:         Anesthesia Quick Evaluation

## 2015-04-11 ENCOUNTER — Encounter (HOSPITAL_COMMUNITY): Payer: Self-pay | Admitting: Neurosurgery

## 2015-04-11 DIAGNOSIS — I739 Peripheral vascular disease, unspecified: Secondary | ICD-10-CM | POA: Diagnosis not present

## 2015-04-11 DIAGNOSIS — Z955 Presence of coronary angioplasty implant and graft: Secondary | ICD-10-CM | POA: Diagnosis not present

## 2015-04-11 DIAGNOSIS — M4806 Spinal stenosis, lumbar region: Secondary | ICD-10-CM | POA: Diagnosis not present

## 2015-04-11 DIAGNOSIS — I2510 Atherosclerotic heart disease of native coronary artery without angina pectoris: Secondary | ICD-10-CM | POA: Diagnosis not present

## 2015-04-11 DIAGNOSIS — Z87891 Personal history of nicotine dependence: Secondary | ICD-10-CM | POA: Diagnosis not present

## 2015-04-11 DIAGNOSIS — I10 Essential (primary) hypertension: Secondary | ICD-10-CM | POA: Diagnosis not present

## 2015-04-11 LAB — GLUCOSE, CAPILLARY: Glucose-Capillary: 154 mg/dL — ABNORMAL HIGH (ref 70–99)

## 2015-04-11 MED ORDER — OXYCODONE-ACETAMINOPHEN 5-325 MG PO TABS: 1.0000 | ORAL_TABLET | ORAL | Status: DC | PRN

## 2015-04-11 NOTE — Discharge Instructions (Signed)
Wound Care °Leave incision open to air. °You may shower. °Do not scrub directly on incision.  °Do not put any creams, lotions, or ointments on incision. °Activity °Walk each and every day, increasing distance each day. °No lifting greater than 5 lbs.  Avoid bending, arching, and twisting. °No driving for 2 weeks; may ride as a passenger locally. ° °Diet °Resume your normal diet.  °Return to Work °Will be discussed at you follow up appointment. °Call Your Doctor If Any of These Occur °Redness, drainage, or swelling at the wound.  °Temperature greater than 101 degrees. °Severe pain not relieved by pain medication. °Incision starts to come apart. °Follow Up Appt °Call today for appointment in 3 weeks ([phone]) or for problems.  If you have any hardware placed in your spine, you will need an x-ray before your appointment. °

## 2015-04-11 NOTE — Progress Notes (Signed)
Patient alert and oriented, mae's well, voiding adequate amount of urine, swallowing without difficulty, no c/o pain. Patient discharged home with family. Script and discharged instructions given to patient. Patient and family stated understanding of d/c instructions given and has an appointment with MD. 

## 2015-04-11 NOTE — Discharge Summary (Signed)
  Physician Discharge Summary  Patient ID: Jack Wade MRN: [id number] DOB/AGE: 1941-06-12 74 y.o.  Admit date: 04/10/2015 Discharge date: 04/11/2015  Admission Diagnoses: Spondylosis with radiculopathy and herniated nuclear pulposus  Discharge Diagnoses: Same Active Problems:   HNP (herniated nucleus pulposus), lumbar   Discharged Condition: good  Hospital Course: Patient admitted hospital underwent lumbar laminectomy and discectomy postoperatively patient did very well in the floor on the floor was angling and voiding spontaneously tolerating a regular diet and stable for discharge home.  Consults: Significant Diagnostic Studies: Treatments: Lumbar laminectomy microdiscectomy Discharge Exam: Blood pressure 106/48, pulse 62, temperature 97.9 F (36.6 C), temperature source Oral, resp. rate 16, height 6' (1.829 m), weight 95.255 kg (210 lb), SpO2 98 %. Strength out of 5 wound clean dry and intact  Disposition: Home     Medication List    TAKE these medications        aspirin 81 MG tablet  Take 81 mg by mouth daily.     glipiZIDE 5 MG tablet  Commonly known as:  GLUCOTROL  Take 2 tablets (10 mg total) by mouth 2 (two) times daily before a meal.     levothyroxine 100 MCG tablet  Commonly known as:  SYNTHROID, LEVOTHROID  TAKE 1 TABLET (100 MCG TOTAL) BY MOUTH DAILY BEFORE BREAKFAST.     metFORMIN 500 MG 24 hr tablet  Commonly known as:  GLUCOPHAGE XR  Take 2 tablets (1,000 mg total) by mouth 2 (two) times daily. Failed short acting Metformin with nausea     oxyCODONE-acetaminophen 5-325 MG per tablet  Commonly known as:  PERCOCET/ROXICET  Take 1-2 tablets by mouth every 4 (four) hours as needed for moderate pain.     simvastatin 40 MG tablet  Commonly known as:  ZOCOR  Take 1 tablet (40 mg total) by mouth daily.     sitaGLIPtin 100 MG tablet  Commonly known as:  JANUVIA  TAKE 1 TABLET (100 MG TOTAL) BY MOUTH DAILY.           Follow-up Information     Follow up with CABBELL,KYLE L, MD.   Specialty:  Neurosurgery   Contact information:   [address] [place] [address] [place] [address] [phone]       Follow up with Winfield Cunas, MD.   Specialty:  Neurosurgery   Contact information:   [address] [place] [address] [phone]       Signed: Elaina Hoops 04/11/2015, 6:34 AM

## 2015-04-11 NOTE — Progress Notes (Signed)
Patient ID: Jack Wade, male   DOB: [birth year], 74 y.o.   MRN: [id number] Doing well no leg pain  Neurologically intact voiding spontaneously discharge home

## 2015-04-12 NOTE — Anesthesia Postprocedure Evaluation (Signed)
  Anesthesia Post-op Note  Patient: Jack Wade  Procedure(s) Performed: Procedure(s) with comments: LUMBAR LAMINECTOMY/DECOMPRESSION MICRODISCECTOMY 2 LEVELS (Bilateral) - Bilateral L45 L5S1 Laminectomy and Foraminotomy  Patient Location: PACU  Anesthesia Type:General  Level of Consciousness: awake and alert   Airway and Oxygen Therapy: Patient Spontanous Breathing  Post-op Pain: mild  Post-op Assessment: Post-op Vital signs reviewed  Post-op Vital Signs: Reviewed  Last Vitals:  Filed Vitals:   04/11/15 0815  BP: 121/57  Pulse: 63  Temp: 36.8 C  Resp: 18    Complications: No apparent anesthesia complications

## 2015-04-13 ENCOUNTER — Other Ambulatory Visit: Payer: Self-pay | Admitting: Family Medicine

## 2015-04-13 ENCOUNTER — Telehealth: Payer: Self-pay | Admitting: *Deleted

## 2015-04-13 NOTE — Telephone Encounter (Signed)
Patient admitted to hospital for lumbar laminectomy/ decompression. D/c instructions to follow-up with surgery.  Would you like for him to follow-up here as well?

## 2015-04-13 NOTE — Telephone Encounter (Signed)
Not necessary unless he would like to come in for a follow up.

## 2015-04-30 ENCOUNTER — Ambulatory Visit: Payer: Medicare Other | Admitting: Family Medicine

## 2015-05-26 ENCOUNTER — Other Ambulatory Visit: Payer: Self-pay | Admitting: Family Medicine

## 2015-06-09 ENCOUNTER — Telehealth: Payer: Self-pay | Admitting: *Deleted

## 2015-06-09 ENCOUNTER — Encounter: Payer: Self-pay | Admitting: *Deleted

## 2015-06-09 NOTE — Telephone Encounter (Signed)
Pre-Visit Call completed with patient and chart updated.   Pre-Visit Info documented in Specialty Comments under [employer].    

## 2015-06-11 ENCOUNTER — Encounter: Payer: Self-pay | Admitting: Family Medicine

## 2015-06-11 ENCOUNTER — Ambulatory Visit (INDEPENDENT_AMBULATORY_CARE_PROVIDER_SITE_OTHER): Payer: Medicare Other | Admitting: Family Medicine

## 2015-06-11 VITALS — BP 112/70 | HR 78 | Temp 98.3°F | Ht 72.0 in | Wt 218.1 lb

## 2015-06-11 DIAGNOSIS — I10 Essential (primary) hypertension: Secondary | ICD-10-CM | POA: Diagnosis not present

## 2015-06-11 DIAGNOSIS — Z0000 Encounter for general adult medical examination without abnormal findings: Secondary | ICD-10-CM

## 2015-06-11 DIAGNOSIS — E039 Hypothyroidism, unspecified: Secondary | ICD-10-CM

## 2015-06-11 DIAGNOSIS — E119 Type 2 diabetes mellitus without complications: Secondary | ICD-10-CM

## 2015-06-11 DIAGNOSIS — M5440 Lumbago with sciatica, unspecified side: Secondary | ICD-10-CM

## 2015-06-11 DIAGNOSIS — E785 Hyperlipidemia, unspecified: Secondary | ICD-10-CM

## 2015-06-11 DIAGNOSIS — I2510 Atherosclerotic heart disease of native coronary artery without angina pectoris: Secondary | ICD-10-CM | POA: Diagnosis not present

## 2015-06-11 DIAGNOSIS — E1159 Type 2 diabetes mellitus with other circulatory complications: Secondary | ICD-10-CM

## 2015-06-11 MED ORDER — SIMVASTATIN 40 MG PO TABS: 40.0000 mg | ORAL_TABLET | Freq: Every day | ORAL | Status: DC

## 2015-06-11 MED ORDER — GLIPIZIDE 5 MG PO TABS: 10.0000 mg | ORAL_TABLET | Freq: Two times a day (BID) | ORAL | Status: DC

## 2015-06-11 MED ORDER — METFORMIN HCL ER 500 MG PO TB24: 1000.0000 mg | ORAL_TABLET | Freq: Two times a day (BID) | ORAL | Status: DC

## 2015-06-11 MED ORDER — SITAGLIPTIN PHOSPHATE 100 MG PO TABS
ORAL_TABLET | ORAL | Status: DC
Start: 2015-06-11 — End: 2015-12-11

## 2015-06-11 NOTE — Patient Instructions (Addendum)
[place] a copy of the Advanced Directives    Rel of rec opthamology notes  Preventive Care for Adults A healthy lifestyle and preventive care can promote health and wellness. Preventive health guidelines for men include the following key practices:  A routine yearly physical is a good way to check with your health care provider about your health and preventative screening. It is a chance to share any concerns and updates on your health and to receive a thorough exam.  Visit your dentist for a routine exam and preventative care every 6 months. Brush your teeth twice a day and floss once a day. Good oral hygiene prevents tooth decay and gum disease.  The frequency of eye exams is based on your age, health, family medical history, use of contact lenses, and other factors. Follow your health care provider's recommendations for frequency of eye exams.  Eat a healthy diet. Foods such as vegetables, fruits, whole grains, low-fat dairy products, and lean protein foods contain the nutrients you need without too many calories. Decrease your intake of foods high in solid fats, added sugars, and salt. Eat the right amount of calories for you.Get information about a proper diet from your health care provider, if necessary.  Regular physical exercise is one of the most important things you can do for your health. Most adults should get at least 150 minutes of moderate-intensity exercise (any activity that increases your heart rate and causes you to sweat) each week. In addition, most adults need muscle-strengthening exercises on 2 or more days a week.  Maintain a healthy weight. The body mass index (BMI) is a screening tool to identify possible weight problems. It provides an estimate of body fat based on height and weight. Your health care provider can find your BMI and can help you achieve or maintain a healthy weight.For adults 20 years and older:  A BMI below 18.5 is considered underweight.  A BMI of  18.5 to 24.9 is normal.  A BMI of 25 to 29.9 is considered overweight.  A BMI of 30 and above is considered obese.  Maintain normal blood lipids and cholesterol levels by exercising and minimizing your intake of saturated fat. Eat a balanced diet with plenty of fruit and vegetables. Blood tests for lipids and cholesterol should begin at age 63 and be repeated every 5 years. If your lipid or cholesterol levels are high, you are over 50, or you are at high risk for heart disease, you may need your cholesterol levels checked more frequently.Ongoing high lipid and cholesterol levels should be treated with medicines if diet and exercise are not working.  If you smoke, find out from your health care provider how to quit. If you do not use tobacco, do not start.  Lung cancer screening is recommended for adults aged 7-80 years who are at high risk for developing lung cancer because of a history of smoking. A yearly low-dose CT scan of the lungs is recommended for people who have at least a 30-pack-year history of smoking and are a current smoker or have quit within the past 15 years. A pack year of smoking is smoking an average of 1 pack of cigarettes a day for 1 year (for example: 1 pack a day for 30 years or 2 packs a day for 15 years). Yearly screening should continue until the smoker has stopped smoking for at least 15 years. Yearly screening should be stopped for people who develop a health problem that would prevent them  from having lung cancer treatment.  If you choose to drink alcohol, do not have more than 2 drinks per day. One drink is considered to be 12 ounces (355 mL) of beer, 5 ounces (148 mL) of wine, or 1.5 ounces (44 mL) of liquor.  Avoid use of street drugs. Do not share needles with anyone. Ask for help if you need support or instructions about stopping the use of drugs.  High blood pressure causes heart disease and increases the risk of stroke. Your blood pressure should be checked at  least every 1-2 years. Ongoing high blood pressure should be treated with medicines, if weight loss and exercise are not effective.  If you are 42-31 years old, ask your health care provider if you should take aspirin to prevent heart disease.  Diabetes screening involves taking a blood sample to check your fasting blood sugar level. This should be done once every 3 years, after age 66, if you are within normal weight and without risk factors for diabetes. Testing should be considered at a younger age or be carried out more frequently if you are overweight and have at least 1 risk factor for diabetes.  Colorectal cancer can be detected and often prevented. Most routine colorectal cancer screening begins at the age of 28 and continues through age 74. However, your health care provider may recommend screening at an earlier age if you have risk factors for colon cancer. On a yearly basis, your health care provider may provide home test kits to check for hidden blood in the stool. Use of a small camera at the end of a tube to directly examine the colon (sigmoidoscopy or colonoscopy) can detect the earliest forms of colorectal cancer. Talk to your health care provider about this at age 29, when routine screening begins. Direct exam of the colon should be repeated every 5-10 years through age 63, unless early forms of precancerous polyps or small growths are found.  People who are at an increased risk for hepatitis B should be screened for this virus. You are considered at high risk for hepatitis B if:  You were born in a country where hepatitis B occurs often. Talk with your health care provider about which countries are considered high risk.  Your parents were born in a high-risk country and you have not received a shot to protect against hepatitis B (hepatitis B vaccine).  You have HIV or AIDS.  You use needles to inject street drugs.  You live with, or have sex with, someone who has hepatitis  B.  You are a man who has sex with other men (MSM).  You get hemodialysis treatment.  You take certain medicines for conditions such as cancer, organ transplantation, and autoimmune conditions.  Hepatitis C blood testing is recommended for all people born from [birth year] through 1965 and any individual with known risks for hepatitis C.  Practice safe sex. Use condoms and avoid high-risk sexual practices to reduce the spread of sexually transmitted infections (STIs). STIs include gonorrhea, chlamydia, syphilis, trichomonas, herpes, HPV, and human immunodeficiency virus (HIV). Herpes, HIV, and HPV are viral illnesses that have no cure. They can result in disability, cancer, and death.  If you are at risk of being infected with HIV, it is recommended that you take a prescription medicine daily to prevent HIV infection. This is called preexposure prophylaxis (PrEP). You are considered at risk if:  You are a man who has sex with other men (MSM) and have other risk factors.  You are a heterosexual man, are sexually active, and are at increased risk for HIV infection.  You take drugs by injection.  You are sexually active with a partner who has HIV.  Talk with your health care provider about whether you are at high risk of being infected with HIV. If you choose to begin PrEP, you should first be tested for HIV. You should then be tested every 3 months for as long as you are taking PrEP.  A one-time screening for abdominal aortic aneurysm (AAA) and surgical repair of large AAAs by ultrasound are recommended for men ages 70 to 37 years who are current or former smokers.  Healthy men should no longer receive prostate-specific antigen (PSA) blood tests as part of routine cancer screening. Talk with your health care provider about prostate cancer screening.  Testicular cancer screening is not recommended for adult males who have no symptoms. Screening includes self-exam, a health care provider exam, and  other screening tests. Consult with your health care provider about any symptoms you have or any concerns you have about testicular cancer.  Use sunscreen. Apply sunscreen liberally and repeatedly throughout the day. You should seek shade when your shadow is shorter than you. Protect yourself by wearing long sleeves, pants, a wide-brimmed hat, and sunglasses year round, whenever you are outdoors.  Once a month, do a whole-body skin exam, using a mirror to look at the skin on your back. Tell your health care provider about new moles, moles that have irregular borders, moles that are larger than a pencil eraser, or moles that have changed in shape or color.  Stay current with required vaccines (immunizations).  Influenza vaccine. All adults should be immunized every year.  Tetanus, diphtheria, and acellular pertussis (Td, Tdap) vaccine. An adult who has not previously received Tdap or who does not know his vaccine status should receive 1 dose of Tdap. This initial dose should be followed by tetanus and diphtheria toxoids (Td) booster doses every 10 years. Adults with an unknown or incomplete history of completing a 3-dose immunization series with Td-containing vaccines should begin or complete a primary immunization series including a Tdap dose. Adults should receive a Td booster every 10 years.  Varicella vaccine. An adult without evidence of immunity to varicella should receive 2 doses or a second dose if he has previously received 1 dose.  Human papillomavirus (HPV) vaccine. Males aged 1-21 years who have not received the vaccine previously should receive the 3-dose series. Males aged 22-26 years may be immunized. Immunization is recommended through the age of 32 years for any male who has sex with males and did not get any or all doses earlier. Immunization is recommended for any person with an immunocompromised condition through the age of 58 years if he did not get any or all doses earlier. During  the 3-dose series, the second dose should be obtained 4-8 weeks after the first dose. The third dose should be obtained 24 weeks after the first dose and 16 weeks after the second dose.  Zoster vaccine. One dose is recommended for adults aged 82 years or older unless certain conditions are present.  Measles, mumps, and rubella (MMR) vaccine. Adults born before 30 generally are considered immune to measles and mumps. Adults born in [birth year] or later should have 1 or more doses of MMR vaccine unless there is a contraindication to the vaccine or there is laboratory evidence of immunity to each of the three diseases. A routine second dose of  MMR vaccine should be obtained at least 28 days after the first dose for students attending postsecondary schools, health care workers, or international travelers. People who received inactivated measles vaccine or an unknown type of measles vaccine during 1963-1967 should receive 2 doses of MMR vaccine. People who received inactivated mumps vaccine or an unknown type of mumps vaccine before 1979 and are at high risk for mumps infection should consider immunization with 2 doses of MMR vaccine. Unvaccinated health care workers born before [birth year] who lack laboratory evidence of measles, mumps, or rubella immunity or laboratory confirmation of disease should consider measles and mumps immunization with 2 doses of MMR vaccine or rubella immunization with 1 dose of MMR vaccine.  Pneumococcal 13-valent conjugate (PCV13) vaccine. When indicated, a person who is uncertain of his immunization history and has no record of immunization should receive the PCV13 vaccine. An adult aged 94 years or older who has certain medical conditions and has not been previously immunized should receive 1 dose of PCV13 vaccine. This PCV13 should be followed with a dose of pneumococcal polysaccharide (PPSV23) vaccine. The PPSV23 vaccine dose should be obtained at least 8 weeks after the dose of PCV13 vaccine.  An adult aged 66 years or older who has certain medical conditions and previously received 1 or more doses of PPSV23 vaccine should receive 1 dose of PCV13. The PCV13 vaccine dose should be obtained 1 or more years after the last PPSV23 vaccine dose.  Pneumococcal polysaccharide (PPSV23) vaccine. When PCV13 is also indicated, PCV13 should be obtained first. All adults aged 11 years and older should be immunized. An adult younger than age 83 years who has certain medical conditions should be immunized. Any person who resides in a nursing home or long-term care facility should be immunized. An adult smoker should be immunized. People with an immunocompromised condition and certain other conditions should receive both PCV13 and PPSV23 vaccines. People with human immunodeficiency virus (HIV) infection should be immunized as soon as possible after diagnosis. Immunization during chemotherapy or radiation therapy should be avoided. Routine use of PPSV23 vaccine is not recommended for American Indians, [place] Natives, or people younger than 65 years unless there are medical conditions that require PPSV23 vaccine. When indicated, people who have unknown immunization and have no record of immunization should receive PPSV23 vaccine. One-time revaccination 5 years after the first dose of PPSV23 is recommended for people aged 19-64 years who have chronic kidney failure, nephrotic syndrome, asplenia, or immunocompromised conditions. People who received 1-2 doses of PPSV23 before age 51 years should receive another dose of PPSV23 vaccine at age 38 years or later if at least 5 years have passed since the previous dose. Doses of PPSV23 are not needed for people immunized with PPSV23 at or after age 54 years.  Meningococcal vaccine. Adults with asplenia or persistent complement component deficiencies should receive 2 doses of quadrivalent meningococcal conjugate (MenACWY-D) vaccine. The doses should be obtained at least 2 months  apart. Microbiologists working with certain meningococcal bacteria, [place] recruits, people at risk during an outbreak, and people who travel to or live in countries with a high rate of meningitis should be immunized. A first-year college student up through age 33 years who is living in a residence hall should receive a dose if he did not receive a dose on or after his 16th birthday. Adults who have certain high-risk conditions should receive one or more doses of vaccine.  Hepatitis A vaccine. Adults who wish to be protected from this  disease, have certain high-risk conditions, work with hepatitis A-infected animals, work in hepatitis A research labs, or travel to or work in countries with a high rate of hepatitis A should be immunized. Adults who were previously unvaccinated and who anticipate close contact with an international adoptee during the first 60 days after arrival in the [place] from a country with a high rate of hepatitis A should be immunized.  Hepatitis B vaccine. Adults should be immunized if they wish to be protected from this disease, have certain high-risk conditions, may be exposed to blood or other infectious body fluids, are household contacts or sex partners of hepatitis B positive people, are clients or workers in certain care facilities, or travel to or work in countries with a high rate of hepatitis B.  Haemophilus influenzae type b (Hib) vaccine. A previously unvaccinated person with asplenia or sickle cell disease or having a scheduled splenectomy should receive 1 dose of Hib vaccine. Regardless of previous immunization, a recipient of a hematopoietic stem cell transplant should receive a 3-dose series 6-12 months after his successful transplant. Hib vaccine is not recommended for adults with HIV infection. Preventive Service / Frequency Ages 33 to 91  Blood pressure check.** / Every 1 to 2 years.  Lipid and cholesterol check.** / Every 5 years beginning at age  22.  Hepatitis C blood test.** / For any individual with known risks for hepatitis C.  Skin self-exam. / Monthly.  Influenza vaccine. / Every year.  Tetanus, diphtheria, and acellular pertussis (Tdap, Td) vaccine.** / Consult your health care provider. 1 dose of Td every 10 years.  Varicella vaccine.** / Consult your health care provider.  HPV vaccine. / 3 doses over 6 months, if 4 or younger.  Measles, mumps, rubella (MMR) vaccine.** / You need at least 1 dose of MMR if you were born in 1957 or later. You may also need a second dose.  Pneumococcal 13-valent conjugate (PCV13) vaccine.** / Consult your health care provider.  Pneumococcal polysaccharide (PPSV23) vaccine.** / 1 to 2 doses if you smoke cigarettes or if you have certain conditions.  Meningococcal vaccine.** / 1 dose if you are age 53 to 60 years and a Market researcher living in a residence hall, or have one of several medical conditions. You may also need additional booster doses.  Hepatitis A vaccine.** / Consult your health care provider.  Hepatitis B vaccine.** / Consult your health care provider.  Haemophilus influenzae type b (Hib) vaccine.** / Consult your health care provider. Ages 79 to 66  Blood pressure check.** / Every 1 to 2 years.  Lipid and cholesterol check.** / Every 5 years beginning at age 36.  Lung cancer screening. / Every year if you are aged 27-80 years and have a 30-pack-year history of smoking and currently smoke or have quit within the past 15 years. Yearly screening is stopped once you have quit smoking for at least 15 years or develop a health problem that would prevent you from having lung cancer treatment.  Fecal occult blood test (FOBT) of stool. / Every year beginning at age 53 and continuing until age 77. You may not have to do this test if you get a colonoscopy every 10 years.  Flexible sigmoidoscopy** or colonoscopy.** / Every 5 years for a flexible sigmoidoscopy or every  10 years for a colonoscopy beginning at age 93 and continuing until age 21.  Hepatitis C blood test.** / For all people born from [birth year] through 1965 and any  individual with known risks for hepatitis C.  Skin self-exam. / Monthly.  Influenza vaccine. / Every year.  Tetanus, diphtheria, and acellular pertussis (Tdap/Td) vaccine.** / Consult your health care provider. 1 dose of Td every 10 years.  Varicella vaccine.** / Consult your health care provider.  Zoster vaccine.** / 1 dose for adults aged 35 years or older.  Measles, mumps, rubella (MMR) vaccine.** / You need at least 1 dose of MMR if you were born in 1957 or later. You may also need a second dose.  Pneumococcal 13-valent conjugate (PCV13) vaccine.** / Consult your health care provider.  Pneumococcal polysaccharide (PPSV23) vaccine.** / 1 to 2 doses if you smoke cigarettes or if you have certain conditions.  Meningococcal vaccine.** / Consult your health care provider.  Hepatitis A vaccine.** / Consult your health care provider.  Hepatitis B vaccine.** / Consult your health care provider.  Haemophilus influenzae type b (Hib) vaccine.** / Consult your health care provider. Ages 73 and over  Blood pressure check.** / Every 1 to 2 years.  Lipid and cholesterol check.**/ Every 5 years beginning at age 51.  Lung cancer screening. / Every year if you are aged 8-80 years and have a 30-pack-year history of smoking and currently smoke or have quit within the past 15 years. Yearly screening is stopped once you have quit smoking for at least 15 years or develop a health problem that would prevent you from having lung cancer treatment.  Fecal occult blood test (FOBT) of stool. / Every year beginning at age 58 and continuing until age 106. You may not have to do this test if you get a colonoscopy every 10 years.  Flexible sigmoidoscopy** or colonoscopy.** / Every 5 years for a flexible sigmoidoscopy or every 10 years for a colonoscopy  beginning at age 70 and continuing until age 44.  Hepatitis C blood test.** / For all people born from [birth year] through 1964 and any individual with known risks for hepatitis C.  Abdominal aortic aneurysm (AAA) screening.** / A one-time screening for ages 62 to 3 years who are current or former smokers.  Skin self-exam. / Monthly.  Influenza vaccine. / Every year.  Tetanus, diphtheria, and acellular pertussis (Tdap/Td) vaccine.** / 1 dose of Td every 10 years.  Varicella vaccine.** / Consult your health care provider.  Zoster vaccine.** / 1 dose for adults aged 56 years or older.  Pneumococcal 13-valent conjugate (PCV13) vaccine.** / Consult your health care provider.  Pneumococcal polysaccharide (PPSV23) vaccine.** / 1 dose for all adults aged 40 years and older.  Meningococcal vaccine.** / Consult your health care provider.  Hepatitis A vaccine.** / Consult your health care provider.  Hepatitis B vaccine.** / Consult your health care provider.  Haemophilus influenzae type b (Hib) vaccine.** / Consult your health care provider. **Family history and personal history of risk and conditions may change your health care provider's recommendations. Document Released: 02/07/2002 Document Revised: 12/17/2013 Document Reviewed: 05/09/2011 [facility] Patient Information 2015 [place], [place]. This information is not intended to replace advice given to you by your health care provider. Make sure you discuss any questions you have with your health care provider.

## 2015-06-11 NOTE — Progress Notes (Signed)
Pre visit review using our clinic review tool, if applicable. No additional management support is needed unless otherwise documented below in the visit note. 

## 2015-06-12 LAB — LIPID PANEL
Cholesterol: 125 mg/dL (ref 0–200)
HDL: 40.3 mg/dL (ref >39.00)
LDL Cholesterol: 47 mg/dL (ref 0–99)
NonHDL: 84.7
Total CHOL/HDL Ratio: 3
Triglycerides: 191 mg/dL — ABNORMAL HIGH (ref 0.0–149.0)
VLDL: 38.2 mg/dL (ref 0.0–40.0)

## 2015-06-12 LAB — COMPREHENSIVE METABOLIC PANEL
ALT: 20 U/L (ref 0–53)
AST: 25 U/L (ref 0–37)
Albumin: 4.6 g/dL (ref 3.5–5.2)
Alkaline Phosphatase: 64 U/L (ref 39–117)
BUN: 20 mg/dL (ref 6–23)
CO2: 26 mEq/L (ref 19–32)
Calcium: 9.7 mg/dL (ref 8.4–10.5)
Chloride: 104 mEq/L (ref 96–112)
Creatinine, Ser: 1.47 mg/dL (ref 0.40–1.50)
GFR: 49.97 mL/min — ABNORMAL LOW (ref >60.00)
Glucose, Bld: 89 mg/dL (ref 70–99)
Potassium: 4.1 mEq/L (ref 3.5–5.1)
Sodium: 139 mEq/L (ref 135–145)
Total Bilirubin: 0.4 mg/dL (ref 0.2–1.2)
Total Protein: 7.4 g/dL (ref 6.0–8.3)

## 2015-06-12 LAB — CBC
HCT: 42.1 % (ref 39.0–52.0)
Hemoglobin: 14.1 g/dL (ref 13.0–17.0)
MCHC: 33.4 g/dL (ref 30.0–36.0)
MCV: 83.9 fl (ref 78.0–100.0)
Platelets: 247 10*3/uL (ref 150.0–400.0)
RBC: 5.02 Mil/uL (ref 4.22–5.81)
RDW: 14 % (ref 11.5–15.5)
WBC: 7.9 10*3/uL (ref 4.0–10.5)

## 2015-06-12 LAB — HEMOGLOBIN A1C: Hgb A1c MFr Bld: 6.7 % — ABNORMAL HIGH (ref 4.6–6.5)

## 2015-06-12 LAB — TSH: TSH: 0.95 u[IU]/mL (ref 0.35–4.50)

## 2015-06-14 ENCOUNTER — Encounter: Payer: Self-pay | Admitting: Family Medicine

## 2015-06-14 DIAGNOSIS — Z0000 Encounter for general adult medical examination without abnormal findings: Secondary | ICD-10-CM

## 2015-06-14 HISTORY — DX: Encounter for general adult medical examination without abnormal findings: Z00.00

## 2015-06-14 NOTE — Progress Notes (Signed)
Jack Wade  [id number] 09-12-1941 06/14/2015      Progress Note-Follow Up  Subjective  Chief Complaint  Chief Complaint  Patient presents with  . Medicare Wellness    HPI  Patient is a 74 y.o. male in today for routine medical care.  Past Medical History  Diagnosis Date  . Hyperlipidemia   . Hypertension   . CAD (coronary artery disease)     2 stents  . Diabetes type 2, controlled   . Chicken pox   . Measles   . Appendicitis   . Mumps as a child  . Melanoma     Scalp. 2012  . Hypothyroidism   . Thyroid disease     Beoming Hyperthyroidism  . Dysphagia, unspecified(787.20) 03/20/2014  . Back pain 02/09/2015  . Aortic calcification 02/09/2015  . Hereditary and idiopathic peripheral neuropathy 02/09/2015  . Medicare annual wellness visit, subsequent 06/14/2015    Past Surgical History  Procedure Laterality Date  . Appendectomy    . Coronary angioplasty with stent placement      2 stents  . Cardiac catheterization    . Back surgery      x2 lumbar  . Rotator cuff repair Right   . Wisdom tooth extraction    . Tonsillectomy and adenoidectomy    . Skin cancer excision      melanoma on scalp  . Back surgery      late 1990s had 2 surgeries, first surgery lifted a heavy engine ruptured disds, at L4 and L5, cleaned discs no hardware very helpful. 2 years later while recovering from angiogram with weights applied to left leg caused a recurrence and required surgery again in same area with disc repair  . Knee arthroscopy      right knee  . Lumbar laminectomy/decompression microdiscectomy Bilateral 04/10/2015    Procedure: LUMBAR LAMINECTOMY/DECOMPRESSION MICRODISCECTOMY 2 LEVELS;  Surgeon: Ashok Pall, MD;  Location: [facility];  Service: Neurosurgery;  Laterality: Bilateral;  Bilateral L45 L5S1 Laminectomy and Foraminotomy    Family History  Problem Relation Age of Onset  . Heart disease Father   . Emphysema Father   . Hypertension Father   . Cancer Father      lung/ healthy  . Heart disease Mother     Deceased  . Cancer Mother     breast  . Hypertension Mother   . Cancer Maternal Grandmother     colon  . Diabetes Brother     type 2  . Heart disease Brother   . Pulmonary embolism Son     vasculiti    History   Social History  . Marital Status: Married    Spouse Name: N/A  . Number of Children: 2  . Years of Education: N/A   Occupational History  . Not on file.   Social History Main Topics  . Smoking status: Former Smoker -- 2.00 packs/day for 30 years    Types: Cigarettes    Start date: 12/27/1983  . Smokeless tobacco: Never Used  . Alcohol Use: Yes     Comment: occasionally  . Drug Use: No  . 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    Other Topics Concern  . Not on file   Social History Narrative    Current Outpatient Prescriptions on File Prior to Visit  Medication Sig Dispense Refill  . aspirin 81 MG tablet Take 81 mg by mouth daily.    Marland Kitchen levothyroxine (  SYNTHROID, LEVOTHROID) 100 MCG tablet TAKE 1 TABLET (100 MCG TOTAL) BY MOUTH DAILY BEFORE BREAKFAST. 90 tablet 1   No current facility-administered medications on file prior to visit.    No Known Allergies  Allergies verified: NKA   Immunization Status: Flu vaccine-- has not had  Tdap-- 03/20/14 PNA-- has not had  Shingles-- unknown   A/P:  Changes to FH, PSH or Personal Hx: son had PE this past year, patient had lumbar laminectomy 04/10/15 CCS: 03/19/13 with Ned Card[facility] at [facility]- normal  PSA: none found  Bone Density: none found   Care Teams Updated: Ashok Pall, MD- Neurosurgery, Kirk Ruths, MD- Cardiology  ED/Hospital/Urgent Care Visits: None recent  Review of Systems  Review of Systems  Constitutional: Negative for fever, chills and malaise/fatigue.  HENT: Negative for congestion, hearing loss and nosebleeds.   Eyes: Negative for discharge.  Respiratory:  Negative for cough, sputum production, shortness of breath and wheezing.   Cardiovascular: Negative for chest pain, palpitations and leg swelling.  Gastrointestinal: Negative for heartburn, nausea, vomiting, abdominal pain, diarrhea, constipation and blood in stool.  Genitourinary: Negative for dysuria, urgency, frequency and hematuria.  Musculoskeletal: Positive for back pain. Negative for myalgias and falls.  Skin: Negative for rash.  Neurological: Negative for dizziness, tremors, sensory change, focal weakness, loss of consciousness, weakness and headaches.  Endo/Heme/Allergies: Negative for polydipsia. Does not bruise/bleed easily.  Psychiatric/Behavioral: Negative for depression and suicidal ideas. The patient is not nervous/anxious and does not have insomnia.     Objective  BP 112/70 mmHg  Pulse 78  Temp(Src) 98.3 F (36.8 C) (Oral)  Ht 6' (1.829 m)  Wt 218 lb 2 oz (98.941 kg)  BMI 29.58 kg/m2  SpO2 97%  Physical Exam  Physical Exam  Constitutional: He is oriented to person, place, and time and well-developed, well-nourished, and in no distress. No distress.  HENT:  Head: Normocephalic and atraumatic.  Eyes: Conjunctivae are normal.  Neck: Neck supple. No thyromegaly present.  Cardiovascular: Normal rate, regular rhythm and normal heart sounds.   No murmur heard. Pulmonary/Chest: Effort normal and breath sounds normal. No respiratory distress.  Abdominal: He exhibits no distension and no mass. There is no tenderness.  Musculoskeletal: He exhibits no edema.  Neurological: He is alert and oriented to person, place, and time.  Skin: Skin is warm.  Psychiatric: Memory, affect and judgment normal.    Lab Results  Component Value Date   TSH 0.95 06/11/2015   Lab Results  Component Value Date   WBC 7.9 06/11/2015   HGB 14.1 06/11/2015   HCT 42.1 06/11/2015   MCV 83.9 06/11/2015   PLT 247.0 06/11/2015   Lab Results  Component Value Date   CREATININE 1.47 06/11/2015    BUN 20 06/11/2015   NA 139 06/11/2015   K 4.1 06/11/2015   CL 104 06/11/2015   CO2 26 06/11/2015   Lab Results  Component Value Date   ALT 20 06/11/2015   AST 25 06/11/2015   ALKPHOS 64 06/11/2015   BILITOT 0.4 06/11/2015   Lab Results  Component Value Date   CHOL 125 06/11/2015   Lab Results  Component Value Date   HDL 40.30 06/11/2015   Lab Results  Component Value Date   LDLCALC 47 06/11/2015   Lab Results  Component Value Date   TRIG 191.0* 06/11/2015   Lab Results  Component Value Date   CHOLHDL 3 06/11/2015     Assessment & Plan    HTN (hypertension) Well  controlled, no changes to meds. Encouraged heart healthy diet such as the DASH diet and exercise as tolerated.   Hypothyroidism On Levothyroxine, continue to monitor  Diabetes hgba1c acceptable, minimize simple carbs. Increase exercise as tolerated. Continue current meds  Hyperlipidemia Tolerating statin, encouraged heart healthy diet, avoid trans fats, minimize simple carbs and saturated fats. Increase exercise as tolerated  Back pain Encouraged moist heat and gentle stretching as tolerated. May try NSAIDs and prescription meds as directed and report if symptoms worsen or seek immediate care. His back surgery for spinal stenosis at l4-5 and l5-s1 went well with Dr Arneta Cliche.  Medicare annual wellness visit, subsequent See care team functionality for list of patient's other providers.  Patient denies any difficulties at home. No trouble with ADLs, depression or falls. No recent changes to vision or hearing. Is UTD with immunizations. Is UTD with screening. Discussed Advanced Directives, patient agrees to bring [place] copies of documents if can. Encouraged heart healthy diet, exercise as tolerated and adequate sleep See problem list for risk factors See AVS for preventative care recommendations. colnoscopy [facility], repeat in 5 years. Allergies verified: NKA   Immunization Status: Flu  vaccine-- has not had  Tdap-- 03/20/14  Changes to FH, PSH or Personal Hx: son had PE this past year, patient had lumbar laminectomy 04/10/15 CCS: 03/19/13 with Ned Card, MD at [facility]- normal  PSA: none found  Bone Density: none found   Care Teams Updated: Ashok Pall, MD- Neurosurgery, Kirk Ruths, MD- Cardiology  ED/Hospital/Urgent Care Visits: None recent

## 2015-06-14 NOTE — Assessment & Plan Note (Signed)
On Levothyroxine, continue to monitor 

## 2015-06-14 NOTE — Assessment & Plan Note (Signed)
Tolerating statin, encouraged heart healthy diet, avoid trans fats, minimize simple carbs and saturated fats. Increase exercise as tolerated 

## 2015-06-14 NOTE — Assessment & Plan Note (Addendum)
Encouraged moist heat and gentle stretching as tolerated. May try NSAIDs and prescription meds as directed and report if symptoms worsen or seek immediate care. His back surgery for spinal stenosis at l4-5 and l5-s1 went well with Dr Arneta Cliche.

## 2015-06-14 NOTE — Assessment & Plan Note (Signed)
Well controlled, no changes to meds. Encouraged heart healthy diet such as the DASH diet and exercise as tolerated.  °

## 2015-06-14 NOTE — Assessment & Plan Note (Signed)
hgba1c acceptable, minimize simple carbs. Increase exercise as tolerated. Continue current meds 

## 2015-06-14 NOTE — Assessment & Plan Note (Addendum)
See care team functionality for list of patient's other providers.  Patient denies any difficulties at home. No trouble with ADLs, depression or falls. No recent changes to vision or hearing. Is UTD with immunizations. Is UTD with screening. Discussed Advanced Directives, patient agrees to bring [place] copies of documents if can. Encouraged heart healthy diet, exercise as tolerated and adequate sleep See problem list for risk factors See AVS for preventative care recommendations. colnoscopy [facility], repeat in 5 years. Allergies verified: NKA   Immunization Status: Flu vaccine-- has not had  Tdap-- 03/20/14  Changes to FH, PSH or Personal Hx: son had PE this past year, patient had lumbar laminectomy 04/10/15 CCS: 03/19/13 with Ned Card, MD at [facility]- normal  PSA: none found  Bone Density: none found   Care Teams Updated: Ashok Pall, MD- Neurosurgery, Kirk Ruths, MD- Cardiology  ED/Hospital/Urgent Care Visits: None recent

## 2015-07-10 DIAGNOSIS — E119 Type 2 diabetes mellitus without complications: Secondary | ICD-10-CM | POA: Diagnosis not present

## 2015-07-10 DIAGNOSIS — H5203 Hypermetropia, bilateral: Secondary | ICD-10-CM | POA: Diagnosis not present

## 2015-07-10 DIAGNOSIS — H524 Presbyopia: Secondary | ICD-10-CM | POA: Diagnosis not present

## 2015-07-10 LAB — HM DIABETES EYE EXAM

## 2015-07-14 ENCOUNTER — Ambulatory Visit (INDEPENDENT_AMBULATORY_CARE_PROVIDER_SITE_OTHER): Payer: Medicare Other | Admitting: Family Medicine

## 2015-07-14 ENCOUNTER — Encounter: Payer: Self-pay | Admitting: Family Medicine

## 2015-07-14 ENCOUNTER — Ambulatory Visit: Payer: Medicare Other | Admitting: Family Medicine

## 2015-07-14 VITALS — BP 116/68 | HR 75 | Temp 98.3°F | Ht 72.0 in | Wt 219.4 lb

## 2015-07-14 DIAGNOSIS — M25562 Pain in left knee: Secondary | ICD-10-CM

## 2015-07-14 DIAGNOSIS — M79602 Pain in left arm: Secondary | ICD-10-CM | POA: Insufficient documentation

## 2015-07-14 DIAGNOSIS — I2510 Atherosclerotic heart disease of native coronary artery without angina pectoris: Secondary | ICD-10-CM | POA: Diagnosis not present

## 2015-07-14 MED ORDER — MELOXICAM 15 MG PO TABS: 15.0000 mg | ORAL_TABLET | Freq: Every day | ORAL | Status: DC

## 2015-07-14 NOTE — Patient Instructions (Signed)

## 2015-07-14 NOTE — Assessment & Plan Note (Signed)
Knee sleeve Ice, rest Elevate mobic prn  Ortho if no relief

## 2015-07-14 NOTE — Progress Notes (Signed)
Patient ID: Jack Wade, male    DOB: May 15, 1941  Age: 74 y.o. MRN: [id number]    Subjective:  Subjective HPI Jack Wade presents for L knee pain x 3-4 days,  No known injury.      Review of Systems  Constitutional: Negative for diaphoresis, appetite change, fatigue and unexpected weight change.  Eyes: Negative for pain, redness and visual disturbance.  Respiratory: Negative for cough, chest tightness, shortness of breath and wheezing.   Cardiovascular: Negative for chest pain, palpitations and leg swelling.  Endocrine: Negative for cold intolerance, heat intolerance, polydipsia, polyphagia and polyuria.  Genitourinary: Negative for dysuria, frequency and difficulty urinating.  Musculoskeletal: Positive for joint swelling.       L knee pain medially with some mild swelling Some crepitus  Neurological: Negative for dizziness, light-headedness, numbness and headaches.    History Past Medical History  Diagnosis Date  . Hyperlipidemia   . Hypertension   . CAD (coronary artery disease)     2 stents  . Diabetes type 2, controlled   . Chicken pox   . Measles   . Appendicitis   . Mumps as a child  . Melanoma     Scalp. 2012  . Hypothyroidism   . Thyroid disease     [place] Hyperthyroidism  . Dysphagia, unspecified(787.20) 03/20/2014  . Back pain 02/09/2015  . Aortic calcification 02/09/2015  . Hereditary and idiopathic peripheral neuropathy 02/09/2015  . Medicare annual wellness visit, subsequent 06/14/2015    He has past surgical history that includes Appendectomy; Coronary angioplasty with stent; Cardiac catheterization; Back surgery; Rotator cuff repair (Right); Wisdom tooth extraction; Tonsillectomy and adenoidectomy; Skin cancer excision; Back surgery; Knee arthroscopy; and Lumbar laminectomy/decompression microdiscectomy (Bilateral, 04/10/2015).   His family history includes Cancer in his father, maternal grandmother, and mother; Diabetes in his brother;  Emphysema in his father; Heart disease in his brother, father, and mother; Hypertension in his father and mother; Pulmonary embolism in his son.He reports that he has quit smoking. His smoking use included Cigarettes. He started smoking about 31 years ago. He has a 60 pack-year smoking history. He has never used smokeless tobacco. He reports that he drinks alcohol. He reports that he does not use illicit drugs.  Current Outpatient Prescriptions on File Prior to Visit  Medication Sig Dispense Refill  . aspirin 81 MG tablet Take 81 mg by mouth daily.    Marland Kitchen glipiZIDE (GLUCOTROL) 5 MG tablet Take 2 tablets (10 mg total) by mouth 2 (two) times daily before a meal. 360 tablet 2  . levothyroxine (SYNTHROID, LEVOTHROID) 100 MCG tablet TAKE 1 TABLET (100 MCG TOTAL) BY MOUTH DAILY BEFORE BREAKFAST. 90 tablet 1  . metFORMIN (GLUCOPHAGE XR) 500 MG 24 hr tablet Take 2 tablets (1,000 mg total) by mouth 2 (two) times daily. Failed short acting Metformin with nausea 360 tablet 2  . simvastatin (ZOCOR) 40 MG tablet Take 1 tablet (40 mg total) by mouth daily. 90 tablet 2  . sitaGLIPtin (JANUVIA) 100 MG tablet TAKE 1 TABLET (100 MG TOTAL) BY MOUTH DAILY. 90 tablet 2   No current facility-administered medications on file prior to visit.     Objective:  Objective Physical Exam  Constitutional: He is oriented to person, place, and time. Vital signs are normal. He appears well-developed and well-nourished. He is sleeping.  HENT:  Head: Normocephalic and atraumatic.  Mouth/Throat: Oropharynx is clear and moist.  Eyes: EOM are normal. Pupils are equal, round, and reactive to light.  Neck: Normal range  of motion. Neck supple. No thyromegaly present.  Cardiovascular: Normal rate and regular rhythm.   No murmur heard. Pulmonary/Chest: Effort normal and breath sounds normal. No respiratory distress. He has no wheezes. He has no rales. He exhibits no tenderness.  Musculoskeletal: He exhibits edema and tenderness.        Left knee: He exhibits swelling. He exhibits no effusion. Tenderness found. Medial joint line tenderness noted.       Legs: Neurological: He is alert and oriented to person, place, and time.  Skin: Skin is warm and dry.  Psychiatric: He has a normal mood and affect. His behavior is normal. Judgment and thought content normal.   BP 116/68 mmHg  Pulse 75  Temp(Src) 98.3 F (36.8 C) (Oral)  Ht 6' (1.829 m)  Wt 219 lb 6.4 oz (99.519 kg)  BMI 29.75 kg/m2  SpO2 98% Wt Readings from Last 3 Encounters:  07/14/15 219 lb 6.4 oz (99.519 kg)  06/11/15 218 lb 2 oz (98.941 kg)  04/10/15 210 lb (95.255 kg)     Lab Results  Component Value Date   WBC 7.9 06/11/2015   HGB 14.1 06/11/2015   HCT 42.1 06/11/2015   PLT 247.0 06/11/2015   GLUCOSE 89 06/11/2015   CHOL 125 06/11/2015   TRIG 191.0* 06/11/2015   HDL 40.30 06/11/2015   LDLDIRECT 78.1 11/10/2014   LDLCALC 47 06/11/2015   ALT 20 06/11/2015   AST 25 06/11/2015   NA 139 06/11/2015   K 4.1 06/11/2015   CL 104 06/11/2015   CREATININE 1.47 06/11/2015   BUN 20 06/11/2015   CO2 26 06/11/2015   TSH 0.95 06/11/2015   HGBA1C 6.7* 06/11/2015   MICROALBUR <0.7 03/03/2015    No results found.   Assessment & Plan:  Plan I am having Jack Wade start on meloxicam. I am also having him maintain his aspirin, levothyroxine, glipiZIDE, metFORMIN, simvastatin, and sitaGLIPtin.  Meds ordered this encounter  Medications  . meloxicam (MOBIC) 15 MG tablet    Sig: Take 1 tablet (15 mg total) by mouth daily.    Dispense:  30 tablet    Refill:  0    Problem List Items Addressed This Visit    Knee pain, left    Knee sleeve Ice, rest Elevate mobic prn  Ortho if no relief       Other Visit Diagnoses    Left knee pain    -  Primary    Relevant Medications    meloxicam (MOBIC) 15 MG tablet       Follow-up: No Follow-up on file.  Garnet Koyanagi, DO

## 2015-07-14 NOTE — Progress Notes (Signed)
Pre visit review using our clinic review tool, if applicable. No additional management support is needed unless otherwise documented below in the visit note. 

## 2015-07-17 ENCOUNTER — Other Ambulatory Visit: Payer: Self-pay | Admitting: Family Medicine

## 2015-07-17 ENCOUNTER — Telehealth: Payer: Self-pay | Admitting: Family Medicine

## 2015-07-17 DIAGNOSIS — M25562 Pain in left knee: Secondary | ICD-10-CM

## 2015-07-17 NOTE — Telephone Encounter (Signed)
Jack Wade patient, please advise     KP

## 2015-07-17 NOTE — Telephone Encounter (Signed)
Referral is in.

## 2015-07-17 NOTE — Telephone Encounter (Signed)
Name: Wlliam, [place] Relation to pt: spouse  Call back number: [phone] or [phone]   Reason for call:  requesting a referral to [facility] due to reoccurring knee pain. Patient is looking for an immediate appointment ( today or Monday) due to patient being in so much pain. Patient last OV 07/14/2015

## 2015-07-20 DIAGNOSIS — M25562 Pain in left knee: Secondary | ICD-10-CM | POA: Diagnosis not present

## 2015-07-23 DIAGNOSIS — M25562 Pain in left knee: Secondary | ICD-10-CM | POA: Diagnosis not present

## 2015-07-27 ENCOUNTER — Encounter: Payer: Self-pay | Admitting: Family Medicine

## 2015-07-27 DIAGNOSIS — S83242A Other tear of medial meniscus, current injury, left knee, initial encounter: Secondary | ICD-10-CM | POA: Diagnosis not present

## 2015-07-27 DEATH — deceased

## 2015-07-30 DIAGNOSIS — S83242A Other tear of medial meniscus, current injury, left knee, initial encounter: Secondary | ICD-10-CM | POA: Diagnosis not present

## 2015-07-30 DIAGNOSIS — M94262 Chondromalacia, left knee: Secondary | ICD-10-CM | POA: Diagnosis not present

## 2015-07-30 DIAGNOSIS — M23222 Derangement of posterior horn of medial meniscus due to old tear or injury, left knee: Secondary | ICD-10-CM | POA: Diagnosis not present

## 2015-08-12 DIAGNOSIS — S83242D Other tear of medial meniscus, current injury, left knee, subsequent encounter: Secondary | ICD-10-CM | POA: Diagnosis not present

## 2015-08-24 DIAGNOSIS — Z08 Encounter for follow-up examination after completed treatment for malignant neoplasm: Secondary | ICD-10-CM | POA: Diagnosis not present

## 2015-08-24 DIAGNOSIS — L570 Actinic keratosis: Secondary | ICD-10-CM | POA: Diagnosis not present

## 2015-08-24 DIAGNOSIS — Z85820 Personal history of malignant melanoma of skin: Secondary | ICD-10-CM | POA: Diagnosis not present

## 2015-09-09 DIAGNOSIS — S83242D Other tear of medial meniscus, current injury, left knee, subsequent encounter: Secondary | ICD-10-CM | POA: Diagnosis not present

## 2015-09-15 ENCOUNTER — Ambulatory Visit: Payer: Medicare Other | Admitting: Physical Therapy

## 2015-09-16 ENCOUNTER — Ambulatory Visit: Payer: Medicare Other | Attending: Orthopedic Surgery | Admitting: Rehabilitation

## 2015-09-16 ENCOUNTER — Encounter: Payer: Self-pay | Admitting: Rehabilitation

## 2015-09-16 DIAGNOSIS — R262 Difficulty in walking, not elsewhere classified: Secondary | ICD-10-CM | POA: Diagnosis not present

## 2015-09-16 DIAGNOSIS — M259 Joint disorder, unspecified: Secondary | ICD-10-CM

## 2015-09-16 DIAGNOSIS — M545 Low back pain, unspecified: Secondary | ICD-10-CM

## 2015-09-16 DIAGNOSIS — M25562 Pain in left knee: Secondary | ICD-10-CM | POA: Diagnosis not present

## 2015-09-16 NOTE — Therapy (Signed)
[facility] [address]  [place], [place], [address] Phone: [phone]   Fax:  [phone]  Physical Therapy Evaluation  Patient Details  Name: Jack Wade MRN: [id number] Date of Birth: January 31, 1941 Referring Provider:  Marchia Bond, MD  Encounter Date: 09/16/2015      PT End of Session - 09/16/15 1633    Visit Number 1   Number of Visits 8   Date for PT Re-Evaluation 10/14/15   PT Start Time 6606   PT Stop Time 1535   PT Time Calculation (min) 50 min   Activity Tolerance Patient tolerated treatment well      Past Medical History  Diagnosis Date  . Hyperlipidemia   . Hypertension   . CAD (coronary artery disease)     2 stents  . Diabetes type 2, controlled   . Chicken pox   . Measles   . Appendicitis   . Mumps as a child  . Melanoma     Scalp. 2012  . Hypothyroidism   . Thyroid disease     Beoming Hyperthyroidism  . Dysphagia, unspecified(787.20) 03/20/2014  . Back pain 02/09/2015  . Aortic calcification 02/09/2015  . Hereditary and idiopathic peripheral neuropathy 02/09/2015  . Medicare annual wellness visit, subsequent 06/14/2015    Past Surgical History  Procedure Laterality Date  . Appendectomy    . Coronary angioplasty with stent placement      2 stents  . Cardiac catheterization    . Back surgery      x2 lumbar  . Rotator cuff repair Right   . Wisdom tooth extraction    . Tonsillectomy and adenoidectomy    . Skin cancer excision      melanoma on scalp  . Back surgery      late 1990s had 2 surgeries, first surgery lifted a heavy engine ruptured disds, at L4 and L5, cleaned discs no hardware very helpful. 2 years later while recovering from angiogram with weights applied to left leg caused a recurrence and required surgery again in same area with disc repair  . Knee arthroscopy      right knee  . Lumbar laminectomy/decompression microdiscectomy Bilateral 04/10/2015    Procedure:  LUMBAR LAMINECTOMY/DECOMPRESSION MICRODISCECTOMY 2 LEVELS;  Surgeon: Ashok Pall, MD;  Location: [facility];  Service: Neurosurgery;  Laterality: Bilateral;  Bilateral L45 L5S1 Laminectomy and Foraminotomy    There were no vitals filed for this visit.  Visit Diagnosis:  Walking difficulty due to knee joint disorder - Plan: PT plan of care cert/re-cert  Left knee pain - Plan: PT plan of care cert/re-cert  Bilateral low back pain without sciatica - Plan: PT plan of care cert/re-cert      Subjective Assessment - 09/16/15 1448    Subjective Knee: Medial meniscus debridement about 6 weeks ago.  insidious onset with some OA.  surgery went well and feels good in terms of ROM.  The pain is the biggest issue.  Pain is significant with walking, standing, not much at rest.  LUMBAR: had surgery to relieve spinal stenosis in April , this was doing fine until the knee surgery.  Having gluteal region pain bilaterally in the morning and at night.  No back issues during the day.  Hoping the back pain is just muscular.      Pertinent History hx of multiple back surgeries   How long can you stand comfortably? 15 minutes   How long can you walk comfortably? Knee:  15 minutes   Patient Stated Goals decrease knee and back pain.  Back to walking 2.74miles daily   Currently in Pain? No/denies  up to 10/10 instantaeously with no pattern   Pain Location Knee   Pain Orientation Left   Pain Descriptors / Indicators Stabbing   Pain Type Surgical pain   Aggravating Factors  walking, standing and sleeping   Pain Relieving Factors rest,    Multiple Pain Sites Yes   Pain Score 0  up to 5/10 at night   Pain Location Back   Pain Orientation Lower   Pain Descriptors / Indicators Aching   Pain Type Acute pain   Pain Frequency Intermittent   Aggravating Factors  at night, in the morning   Pain Relieving Factors moving            [facility] PT Assessment - 09/16/15 0001    Assessment   Medical Diagnosis L knee  scope, back pain   Onset Date/Surgical Date 08/05/15   Next MD Visit 2 weeks from now   Prior Therapy no   Precautions   Precautions None   Restrictions   Weight Bearing Restrictions No   Balance Screen   Has the patient fallen in the past 6 months No   [place] residence   Prior Function   Level of Independence Independent   Observation/Other Assessments   Observations wears brace as needed   Focus on Therapeutic Outcomes (FOTO)  50% limited   ROM / Strength   AROM / PROM / Strength AROM;Strength   AROM   Overall AROM Comments R knee 0-130 with pain   Strength   Strength Assessment Site Knee;Hip   Right/Left Hip Right;Left   Right Hip Flexion 5/5   Right Hip Extension 4+/5   Right Hip External Rotation  5/5   Right Hip Internal Rotation 5/5   Right Hip ABduction 4+/5   Left Hip Flexion 5/5   Left Hip Extension 4+/5   Left Hip External Rotation 5/5   Left Hip Internal Rotation 5/5   Left Hip ABduction 4-/5   Right/Left Knee Right;Left   Right Knee Flexion 5/5   Right Knee Extension 5/5   Left Knee Flexion 4/5   Left Knee Extension 4+/5   Flexibility   Soft Tissue Assessment /Muscle Length yes   Hamstrings decreased bilaterally with reports of nigh pain here   Quadriceps prone: L decreased by 20%   Palpation   Patella mobility WNL with edema present   Ambulation/Gait   Assistive device None   Ambulation Surface Level   Gait Comments ambulates with L hip drop; and trunk lurch due to spinal cord damage previously.        Kinesiotape fan with 3 tails x 2 with proximal to distal placement at 25% to decrease knee swelling. Education on use and removal                     PT Education - 09/16/15 1632    Education provided Yes   Education Details HEP, icing, use of compression, using cane when walking more than 15-108min   Person(s) Educated Patient   Methods Explanation;Demonstration;Handout   Comprehension  Verbalized understanding          PT Short Term Goals - 09/16/15 1640    PT SHORT TERM GOAL #1   Title pt will be indpendent with initial HEP   Time 1   Period Weeks   Status New  PT Long Term Goals - Sep 26, 2015 1640    PT LONG TERM GOAL #1   Title pt will improve L knee strength to 5/5   Time 4   Period Weeks   Status New   PT LONG TERM GOAL #2   Title pt will report knee pain at worst of 5/10 or less at night   Time 4   Period Weeks   Status New   PT LONG TERM GOAL #3   Title pt will resolve night/morning low back pain   Time 4   Period Weeks   Status New   PT LONG TERM GOAL #4   Title pt will be independent with final HEP   Time 4   Period Weeks   Status New               Plan - 09-26-2015 1634    Clinical Impression Statement Pt presents s/p L knee scope 6-7 weeks ago and flare-up of LBP after knee surgery with lumbar discectomy/stenosis surgery in April.  The back was fine after surgery and pt now has bilateral lower lumbar pain only in the morning and at night.  Most likely due to gait abnormality after knee surgery, but back not yet assessed.  Pt with excellent ROM 0-130 .  Mild knee strength deficits and hip abduction deficits.  Pt does have residual limp/calf atrophy due to nerve damage/compression prior to another back surgery.  overall excellent status but the patient  is concerned about swelling, pain after walking 10-2minutes, and back pain onset.     Pt will benefit from skilled therapeutic intervention in order to improve on the following deficits Pain;Abnormal gait;Impaired flexibility;Decreased strength   Rehab Potential Excellent   PT Frequency 2x / week   PT Duration 4 weeks   PT Treatment/Interventions Electrical Stimulation;Moist Heat;Traction;Ultrasound;Therapeutic activities;Therapeutic exercise;Manual techniques;Taping;Vasopneumatic Device   PT Next Visit Plan review HEP, modalities/taping/manual as needed to decrease edema, begin  post op knee/hip TE, lumbar assessment as needed   Consulted and Agree with Plan of Care Patient          G-Codes - 09-26-2015 1642    Functional Assessment Tool Used FOTO 50%   Functional Limitation Mobility: Walking and moving around   Mobility: Walking and Moving Around Current Status [phone]) At least 40 percent but less than 60 percent impaired, limited or restricted   Mobility: Walking and Moving Around Goal Status [id number]) At least 20 percent but less than 40 percent impaired, limited or restricted       Problem List Patient Active Problem List   Diagnosis Date Noted  . Knee pain, left 07/14/2015  . Medicare annual wellness visit, subsequent 06/14/2015  . HNP (herniated nucleus pulposus), lumbar 04/10/2015  . Peripheral vascular disease 03/18/2015  . Hereditary and idiopathic peripheral neuropathy 02/09/2015  . Back pain 02/09/2015  . Aortic calcification 02/09/2015  . Vertigo 04/04/2014  . CAD (coronary artery disease) 03/23/2014  . HTN (hypertension) 03/23/2014  . H/O tobacco use, presenting hazards to health 03/23/2014  . Preventative health care 03/23/2014  . Pain in joint, ankle and foot 03/23/2014  . Dysphagia, unspecified(787.20) 03/20/2014  . Melanoma   . Hypothyroidism   . Diabetes 12/22/2013  . Hyperlipidemia 12/22/2013    Stark Bray, DPT, CMP 2015-09-26, 4:46 PM  [facility] [address]  [address] [place], [place], [address] Phone: [phone]   Fax:  [phone]

## 2015-09-21 ENCOUNTER — Ambulatory Visit: Payer: Medicare Other | Admitting: Rehabilitation

## 2015-09-21 DIAGNOSIS — M259 Joint disorder, unspecified: Secondary | ICD-10-CM | POA: Diagnosis not present

## 2015-09-21 DIAGNOSIS — M545 Low back pain, unspecified: Secondary | ICD-10-CM

## 2015-09-21 DIAGNOSIS — M25562 Pain in left knee: Secondary | ICD-10-CM

## 2015-09-21 DIAGNOSIS — R262 Difficulty in walking, not elsewhere classified: Principal | ICD-10-CM

## 2015-09-21 NOTE — Therapy (Addendum)
[facility] [address]  [place], [place], [address] Phone: [phone]   Fax:  [phone]  Physical Therapy Treatment  Patient Details  Name: Jack Wade MRN: [id number] Date of Birth: 1941/05/06 Referring Provider:  Marchia Bond, MD  Encounter Date: 09/21/2015      PT End of Session - 09/21/15 1311    Visit Number 2   Number of Visits 8   Date for PT Re-Evaluation 10/14/15   PT Start Time 1312   PT Stop Time 1357   PT Time Calculation (min) 45 min   Activity Tolerance Patient tolerated treatment well   Behavior During Therapy [facility] for tasks assessed/performed      Past Medical History  Diagnosis Date  . Hyperlipidemia   . Hypertension   . CAD (coronary artery disease)     2 stents  . Diabetes type 2, controlled   . Chicken pox   . Measles   . Appendicitis   . Mumps as a child  . Melanoma     Scalp. 2012  . Hypothyroidism   . Thyroid disease     [place] Hyperthyroidism  . Dysphagia, unspecified(787.20) 03/20/2014  . Back pain 02/09/2015  . Aortic calcification 02/09/2015  . Hereditary and idiopathic peripheral neuropathy 02/09/2015  . Medicare annual wellness visit, subsequent 06/14/2015    Past Surgical History  Procedure Laterality Date  . Appendectomy    . Coronary angioplasty with stent placement      2 stents  . Cardiac catheterization    . Back surgery      x2 lumbar  . Rotator cuff repair Right   . Wisdom tooth extraction    . Tonsillectomy and adenoidectomy    . Skin cancer excision      melanoma on scalp  . Back surgery      late 1990s had 2 surgeries, first surgery lifted a heavy engine ruptured disds, at L4 and L5, cleaned discs no hardware very helpful. 2 years later while recovering from angiogram with weights applied to left leg caused a recurrence and required surgery again in same area with disc repair  . Knee arthroscopy      right knee  . Lumbar  laminectomy/decompression microdiscectomy Bilateral 04/10/2015    Procedure: LUMBAR LAMINECTOMY/DECOMPRESSION MICRODISCECTOMY 2 LEVELS;  Surgeon: Ashok Pall, MD;  Location: [place];  Service: Neurosurgery;  Laterality: Bilateral;  Bilateral L45 L5S1 Laminectomy and Foraminotomy    There were no vitals filed for this visit.  Visit Diagnosis:  Walking difficulty due to knee joint disorder  Left knee pain  Bilateral low back pain without sciatica      Subjective Assessment - 09/21/15 1314    Subjective Denies pain currently. States he was sore after last time but is completing HEP everyday. Notes a pull in his hamstrings and his glutes with the bicycle. Reports the hardest exercise is the bridge and is very tight with the hamstring stretch.    Currently in Pain? No/denies     TODAY'S TREATMENT:  TherEx- Rec Bike level 1x5' Manually Stretch Bil Hamstring, SKTC, DKTC 3x20" LTR x1' Bridges 10x  TrA + SLR 10x  Side-Lying Hip Abduction 10x Prone Knee Flexion Stretch 3x20" Prone SLR 10x Standing side-stepping with Green TB at ankles 64ft x 2 6" Fwd and Lateral Step ups 10x 6" Eccentric reach downs 10x with 1 HHA Slow march on Blue Foam 10x with 1 HHA Alt Hip Abduction on Blue Foam 10x with  1 HHA Heel/Toe Raises on Blue Foam 15x with 1 HHA       PT Short Term Goals - 09/21/15 1316    PT SHORT TERM GOAL #1   Title pt will be indpendent with initial HEP   Status Achieved           PT Long Term Goals - 09/21/15 1316    PT LONG TERM GOAL #1   Title pt will improve L knee strength to 5/5   Status On-going   PT LONG TERM GOAL #2   Title pt will report knee pain at worst of 5/10 or less at night   Status On-going   PT LONG TERM GOAL #3   Title pt will resolve night/morning low back pain   Status On-going   PT LONG TERM GOAL #4   Title pt will be independent with final HEP   Status On-going               Plan - 09/21/15 1358    Clinical Impression Statement  No complaint of pain during exercises today, only tightness with stretches. Good tolerance to stretches and new exercises. Unsteady with foam exericses but able to complete with 1 HHA. Did not reapply tape due to pt not noticing a difference.    PT Next Visit Plan Knee/Hip strengthening and stability, modalities/taping/manual as needed to decrease edema, lumbar assessment as needed   Consulted and Agree with Plan of Care Patient        Problem List Patient Active Problem List   Diagnosis Date Noted  . Knee pain, left 07/14/2015  . Medicare annual wellness visit, subsequent 06/14/2015  . HNP (herniated nucleus pulposus), lumbar 04/10/2015  . Peripheral vascular disease 03/18/2015  . Hereditary and idiopathic peripheral neuropathy 02/09/2015  . Back pain 02/09/2015  . Aortic calcification 02/09/2015  . Vertigo 04/04/2014  . CAD (coronary artery disease) 03/23/2014  . HTN (hypertension) 03/23/2014  . H/O tobacco use, presenting hazards to health 03/23/2014  . Preventative health care 03/23/2014  . Pain in joint, ankle and foot 03/23/2014  . Dysphagia, unspecified(787.20) 03/20/2014  . Melanoma   . Hypothyroidism   . Diabetes 12/22/2013  . Hyperlipidemia 12/22/2013    Barbette Hair, PTA 09/21/2015, 2:02 PM  [facility] [address]  [place], [place], [address] Phone: [phone]   Fax:  [phone]

## 2015-09-25 ENCOUNTER — Ambulatory Visit: Payer: Medicare Other | Admitting: Rehabilitation

## 2015-09-25 DIAGNOSIS — M259 Joint disorder, unspecified: Secondary | ICD-10-CM | POA: Diagnosis not present

## 2015-09-25 DIAGNOSIS — M545 Low back pain, unspecified: Secondary | ICD-10-CM

## 2015-09-25 DIAGNOSIS — M25562 Pain in left knee: Secondary | ICD-10-CM

## 2015-09-25 DIAGNOSIS — R262 Difficulty in walking, not elsewhere classified: Secondary | ICD-10-CM | POA: Diagnosis not present

## 2015-09-25 NOTE — Therapy (Signed)
[facility] [address]  [place], [place], [address] Phone: [phone]   Fax:  [phone]  Physical Therapy Treatment  Patient Details  Name: Jack Wade MRN: [id number] Date of Birth: 19-Aug-1941 Referring Provider:  Marchia Bond, MD  Encounter Date: 09/25/2015      PT End of Session - 09/25/15 1013    Visit Number 3   Number of Visits 8   Date for PT Re-Evaluation 10/14/15   PT Start Time 1013   PT Stop Time 1052   PT Time Calculation (min) 39 min      Past Medical History  Diagnosis Date  . Hyperlipidemia   . Hypertension   . CAD (coronary artery disease)     2 stents  . Diabetes type 2, controlled   . Chicken pox   . Measles   . Appendicitis   . Mumps as a child  . Melanoma     Scalp. 2012  . Hypothyroidism   . Thyroid disease     [place] Hyperthyroidism  . Dysphagia, unspecified(787.20) 03/20/2014  . Back pain 02/09/2015  . Aortic calcification 02/09/2015  . Hereditary and idiopathic peripheral neuropathy 02/09/2015  . Medicare annual wellness visit, subsequent 06/14/2015    Past Surgical History  Procedure Laterality Date  . Appendectomy    . Coronary angioplasty with stent placement      2 stents  . Cardiac catheterization    . Back surgery      x2 lumbar  . Rotator cuff repair Right   . Wisdom tooth extraction    . Tonsillectomy and adenoidectomy    . Skin cancer excision      melanoma on scalp  . Back surgery      late 1990s had 2 surgeries, first surgery lifted a heavy engine ruptured disds, at L4 and L5, cleaned discs no hardware very helpful. 2 years later while recovering from angiogram with weights applied to left leg caused a recurrence and required surgery again in same area with disc repair  . Knee arthroscopy      right knee  . Lumbar laminectomy/decompression microdiscectomy Bilateral 04/10/2015    Procedure: LUMBAR LAMINECTOMY/DECOMPRESSION MICRODISCECTOMY 2  LEVELS;  Surgeon: Ashok Pall, MD;  Location: [facility];  Service: Neurosurgery;  Laterality: Bilateral;  Bilateral L45 L5S1 Laminectomy and Foraminotomy    There were no vitals filed for this visit.  Visit Diagnosis:  Walking difficulty due to knee joint disorder  Left knee pain  Bilateral low back pain without sciatica      Subjective Assessment - 09/25/15 1015    Subjective Reports his pain has been bad the past few mornings. Didn't complete HEP yesterday due to being so sore. Notes pain first thing in the mornings as 4-5/10 and currently has soreness with medial/anterior knees, and bilateral glutes. He states he feels better after doing the exercises but then later on pain comes back.    Currently in Pain? No/denies      TODAY'S TREATMENT:  TherEx- Rec Bike level 1x4' Manually Stretch Bil Hamstring, SKTC, piriformis 3x20" LTR x1' TrA Iso 10x5" TrA + Hip Abduction Black TB 10x TrA + March 10x  TrA + SLR 10x  Prone Knee Flexion Stretch 3x20" then with hip extension 3x20" Bridges 10x TrA + QS/hip extension iso into plinth 10x3" (noted anterior knee pain/soreness on Rt) Manually Stretch Bil ITB 3x20" on Lt, patellar mobs m-l, s-I      PT Short Term Goals -  09/21/15 1316    PT SHORT TERM GOAL #1   Title pt will be indpendent with initial HEP   Status Achieved           PT Long Term Goals - 09/21/15 1316    PT LONG TERM GOAL #1   Title pt will improve L knee strength to 5/5   Status On-going   PT LONG TERM GOAL #2   Title pt will report knee pain at worst of 5/10 or less at night   Status On-going   PT LONG TERM GOAL #3   Title pt will resolve night/morning low back pain   Status On-going   PT LONG TERM GOAL #4   Title pt will be independent with final HEP   Status On-going               Plan - 09/25/15 1052    Clinical Impression Statement Focused more on stretching and core work today due to pt feeling flared up after last time. Pt is very  tight in most muscle groups and began stretching for hip flexors and Lt ITB today. Pt also instructed to perform stretches everyday and exercises everyother day due to him feeling overly sore.    PT Next Visit Plan Knee/Hip strengthening and stability, modalities/taping/manual as needed to decrease edema, lumbar assessment as needed; pt will be going out of town next week.    Consulted and Agree with Plan of Care Patient        Problem List Patient Active Problem List   Diagnosis Date Noted  . Knee pain, left 07/14/2015  . Medicare annual wellness visit, subsequent 06/14/2015  . HNP (herniated nucleus pulposus), lumbar 04/10/2015  . Peripheral vascular disease 03/18/2015  . Hereditary and idiopathic peripheral neuropathy 02/09/2015  . Back pain 02/09/2015  . Aortic calcification 02/09/2015  . Vertigo 04/04/2014  . CAD (coronary artery disease) 03/23/2014  . HTN (hypertension) 03/23/2014  . H/O tobacco use, presenting hazards to health 03/23/2014  . Preventative health care 03/23/2014  . Pain in joint, ankle and foot 03/23/2014  . Dysphagia, unspecified(787.20) 03/20/2014  . Melanoma   . Hypothyroidism   . Diabetes 12/22/2013  . Hyperlipidemia 12/22/2013    Barbette Hair, PTA 09/25/2015, 10:54 AM  [facility] [address]  [place], [place], [address] Phone: [phone]   Fax:  [phone]

## 2015-09-28 ENCOUNTER — Ambulatory Visit: Payer: Medicare Other | Attending: Orthopedic Surgery | Admitting: Physical Therapy

## 2015-09-28 DIAGNOSIS — M545 Low back pain, unspecified: Secondary | ICD-10-CM

## 2015-09-28 DIAGNOSIS — R262 Difficulty in walking, not elsewhere classified: Secondary | ICD-10-CM | POA: Diagnosis not present

## 2015-09-28 DIAGNOSIS — M259 Joint disorder, unspecified: Secondary | ICD-10-CM | POA: Insufficient documentation

## 2015-09-28 DIAGNOSIS — M25562 Pain in left knee: Secondary | ICD-10-CM | POA: Insufficient documentation

## 2015-09-28 NOTE — Therapy (Addendum)
[facility] [address]  [place], [place], [address] Phone: [phone]   Fax:  [phone]  Physical Therapy Treatment  Patient Details  Name: Jack Wade MRN: [id number] Date of Birth: 1941-02-13 Referring Provider:  Marchia Bond, MD  Encounter Date: 09/28/2015      PT End of Session - 09/28/15 1421    Visit Number 4   Number of Visits 8   Date for PT Re-Evaluation 10/14/15   PT Start Time 8828   PT Stop Time 1446   PT Time Calculation (min) 50 min      Past Medical History  Diagnosis Date  . Hyperlipidemia   . Hypertension   . CAD (coronary artery disease)     2 stents  . Diabetes type 2, controlled   . Chicken pox   . Measles   . Appendicitis   . Mumps as a child  . Melanoma     Scalp. 2012  . Hypothyroidism   . Thyroid disease     [place] Hyperthyroidism  . Dysphagia, unspecified(787.20) 03/20/2014  . Back pain 02/09/2015  . Aortic calcification 02/09/2015  . Hereditary and idiopathic peripheral neuropathy 02/09/2015  . Medicare annual wellness visit, subsequent 06/14/2015    Past Surgical History  Procedure Laterality Date  . Appendectomy    . Coronary angioplasty with stent placement      2 stents  . Cardiac catheterization    . Back surgery      x2 lumbar  . Rotator cuff repair Right   . Wisdom tooth extraction    . Tonsillectomy and adenoidectomy    . Skin cancer excision      melanoma on scalp  . Back surgery      late 1990s had 2 surgeries, first surgery lifted a heavy engine ruptured disds, at L4 and L5, cleaned discs no hardware very helpful. 2 years later while recovering from angiogram with weights applied to left leg caused a recurrence and required surgery again in same area with disc repair  . Knee arthroscopy      right knee  . Lumbar laminectomy/decompression microdiscectomy Bilateral 04/10/2015    Procedure: LUMBAR LAMINECTOMY/DECOMPRESSION MICRODISCECTOMY 2  LEVELS;  Surgeon: Ashok Pall, MD;  Location: [facility];  Service: Neurosurgery;  Laterality: Bilateral;  Bilateral L45 L5S1 Laminectomy and Foraminotomy    There were no vitals filed for this visit.  Visit Diagnosis:  Walking difficulty due to knee joint disorder  Left knee pain  Bilateral low back pain without sciatica      Subjective Assessment - 09/28/15 1357    Subjective States knee is "not too bad today" rating 3/10.  States was able to perform some yard work over the weekend and noted increased medial L knee pain with this.  States he notes most back and knee pain first thing in the AM and states seems to decrease as he moves around.  He reports that he is sleeping better but still having difficulty due to L knee pain.   Currently in Pain? Yes   Pain Score 3    Pain Location Knee   Pain Orientation Left          TODAY'S TREATMENT TherEx - NuStep lvl 5, 3' Stretch B HS and prone knee flexion Bridge 10x Partial Curl-up 12x B FOB (55cm) curl-up / pullover combo with Green TB in hands 12x B FOB (55cm) LTR 12x each B Knee Flexion Machine 25# 15x, 35# 15x B  Knee Extension Machine 20# 15x, 25# 15x TRX DL Squat 10x Seated Single Hand Low Row Double Black TB 15x each (added to HEP)             PT Education - Oct 23, 2015 1516    Education provided Yes   Education Details seated one arm low row added to HEP   Person(s) Educated Patient   Methods Explanation;Demonstration   Comprehension Verbalized understanding;Returned demonstration          PT Short Term Goals - 09/21/15 1316    PT SHORT TERM GOAL #1   Title pt will be indpendent with initial HEP   Status Achieved           PT Long Term Goals - 10/23/2015 1429    PT LONG TERM GOAL #1   Title pt will improve L knee strength to 5/5   Status Achieved   PT LONG TERM GOAL #2   Title pt will report knee pain at worst of 5/10 or less at night   Status On-going   PT LONG TERM GOAL #3   Title pt will  resolve night/morning low back pain   Status On-going   PT LONG TERM GOAL #4   Title pt will be independent with final HEP   Status On-going               Plan - 2015/10/23 1504    Clinical Impression Statement Mr. Blando is now under my care and today was my first encounter with him.  His knee MMT has progressed to 5/5 but he does note some medial knee pain with Flexion MMT.  His cc at this time is AM pain in L knee and lower back.  He states pain decreases once he moves around.  He tolerated today's treatment very well without c/o pain with any activities.  His HS mobility displays slight restriction without pain.  L prone knee flexion also with slight restriciotn vs R but this improved with stretching and contract/relax.  Mr. Richart is going out of town later this week and will not return to PT for 1-2 weeks.  He may be ready for discharge upon return - pt states today "seeing how good I'm doing here today, I'm not sure why I'm coming"   PT Next Visit Plan re-assess upon return to PT and progress strengthening/stability PRN   Consulted and Agree with Plan of Care Patient     G-Codes - 10/23/2015   Functional Assessment Tool Used Clinical impression   Functional Limitation Mobility: Walking and moving around   Mobility: Walking and Moving Around Current and Discharge Status At least 20 percent but less than 40 percent impaired, limited or restricted   Mobility: Walking and Moving Around Goal Status At least 20 percent but less than 40 percent impaired, limited or restricted              Problem List Patient Active Problem List   Diagnosis Date Noted  . Knee pain, left 07/14/2015  . Medicare annual wellness visit, subsequent 06/14/2015  . HNP (herniated nucleus pulposus), lumbar 04/10/2015  . Peripheral vascular disease ([place]) 03/18/2015  . Hereditary and idiopathic peripheral neuropathy 02/09/2015  . Back pain 02/09/2015  . Aortic calcification ([place]) 02/09/2015   . Vertigo 04/04/2014  . CAD (coronary artery disease) 03/23/2014  . HTN (hypertension) 03/23/2014  . H/O tobacco use, presenting hazards to health 03/23/2014  . Preventative health care 03/23/2014  . Pain in joint, ankle and foot 03/23/2014  .  Dysphagia, unspecified(787.20) 03/20/2014  . Melanoma ([place])   . Hypothyroidism   . Diabetes ([place]) 12/22/2013  . Hyperlipidemia 12/22/2013    Jelitza Manninen PT, OCS 09/28/2015, 3:17 PM  [facility] [address]  [place], [place], [address] Phone: [phone]   Fax:  [phone]    PHYSICAL THERAPY DISCHARGE SUMMARY  Visits from Start of Care: 4 of 8  Current functional level related to goals / functional outcomes: Unknown.  Pt last seen on 09/28/15.  He stated he was going out of town following last treatment and would return to PT upon returning to town.  He has not returned and is therefore being discharged from our care at this time.   Remaining deficits: Intermittent knee and lower back pain   Education / Equipment: HEP Plan: Patient agrees to discharge.  Patient goals were not met. Patient is being discharged due to not returning since the last visit.  ?????        Leonette Most PT, OCS 11/18/2015 10:48 AM

## 2015-10-01 ENCOUNTER — Ambulatory Visit: Payer: Medicare Other | Admitting: Rehabilitation

## 2015-10-05 ENCOUNTER — Other Ambulatory Visit: Payer: Self-pay | Admitting: Family Medicine

## 2015-10-05 ENCOUNTER — Ambulatory Visit: Payer: Medicare Other | Admitting: Physical Therapy

## 2015-10-05 NOTE — Telephone Encounter (Signed)
Advise on instructions as they are not consistent with what is on her current list.

## 2015-10-08 ENCOUNTER — Ambulatory Visit: Payer: Medicare Other | Admitting: Rehabilitation

## 2015-10-12 ENCOUNTER — Ambulatory Visit: Payer: Medicare Other | Admitting: Physical Therapy

## 2015-10-14 ENCOUNTER — Ambulatory Visit: Payer: Medicare Other | Admitting: Physical Therapy

## 2015-10-19 DIAGNOSIS — M25552 Pain in left hip: Secondary | ICD-10-CM | POA: Diagnosis not present

## 2015-10-19 DIAGNOSIS — S83242D Other tear of medial meniscus, current injury, left knee, subsequent encounter: Secondary | ICD-10-CM | POA: Diagnosis not present

## 2015-10-23 DIAGNOSIS — M25562 Pain in left knee: Secondary | ICD-10-CM | POA: Diagnosis not present

## 2015-10-26 DIAGNOSIS — S83242D Other tear of medial meniscus, current injury, left knee, subsequent encounter: Secondary | ICD-10-CM | POA: Diagnosis not present

## 2015-11-02 DIAGNOSIS — S83242D Other tear of medial meniscus, current injury, left knee, subsequent encounter: Secondary | ICD-10-CM | POA: Diagnosis not present

## 2015-11-23 DIAGNOSIS — S83242D Other tear of medial meniscus, current injury, left knee, subsequent encounter: Secondary | ICD-10-CM | POA: Diagnosis not present

## 2015-11-24 ENCOUNTER — Other Ambulatory Visit: Payer: Self-pay | Admitting: Family Medicine

## 2015-12-11 ENCOUNTER — Ambulatory Visit (INDEPENDENT_AMBULATORY_CARE_PROVIDER_SITE_OTHER): Payer: Medicare Other | Admitting: Family Medicine

## 2015-12-11 ENCOUNTER — Encounter: Payer: Self-pay | Admitting: Family Medicine

## 2015-12-11 VITALS — BP 138/69 | HR 69 | Temp 98.2°F | Ht 72.0 in | Wt 221.0 lb

## 2015-12-11 DIAGNOSIS — I2510 Atherosclerotic heart disease of native coronary artery without angina pectoris: Secondary | ICD-10-CM | POA: Diagnosis not present

## 2015-12-11 DIAGNOSIS — E1159 Type 2 diabetes mellitus with other circulatory complications: Secondary | ICD-10-CM

## 2015-12-11 DIAGNOSIS — E039 Hypothyroidism, unspecified: Secondary | ICD-10-CM | POA: Diagnosis not present

## 2015-12-11 DIAGNOSIS — I10 Essential (primary) hypertension: Secondary | ICD-10-CM | POA: Diagnosis not present

## 2015-12-11 DIAGNOSIS — E785 Hyperlipidemia, unspecified: Secondary | ICD-10-CM

## 2015-12-11 LAB — LIPID PANEL
Cholesterol: 116 mg/dL (ref 0–200)
HDL: 38.5 mg/dL — ABNORMAL LOW (ref >39.00)
LDL Cholesterol: 45 mg/dL (ref 0–99)
NonHDL: 77.84
Total CHOL/HDL Ratio: 3
Triglycerides: 165 mg/dL — ABNORMAL HIGH (ref 0.0–149.0)
VLDL: 33 mg/dL (ref 0.0–40.0)

## 2015-12-11 LAB — CBC
HCT: 42.2 % (ref 39.0–52.0)
Hemoglobin: 14 g/dL (ref 13.0–17.0)
MCHC: 33.2 g/dL (ref 30.0–36.0)
MCV: 84.2 fl (ref 78.0–100.0)
Platelets: 266 10*3/uL (ref 150.0–400.0)
RBC: 5.01 Mil/uL (ref 4.22–5.81)
RDW: 13.7 % (ref 11.5–15.5)
WBC: 6.9 10*3/uL (ref 4.0–10.5)

## 2015-12-11 LAB — COMPREHENSIVE METABOLIC PANEL
ALT: 17 U/L (ref 0–53)
AST: 22 U/L (ref 0–37)
Albumin: 4.1 g/dL (ref 3.5–5.2)
Alkaline Phosphatase: 67 U/L (ref 39–117)
BUN: 13 mg/dL (ref 6–23)
CO2: 28 mEq/L (ref 19–32)
Calcium: 9.5 mg/dL (ref 8.4–10.5)
Chloride: 106 mEq/L (ref 96–112)
Creatinine, Ser: 1.13 mg/dL (ref 0.40–1.50)
GFR: 67.6 mL/min (ref >60.00)
Glucose, Bld: 249 mg/dL — ABNORMAL HIGH (ref 70–99)
Potassium: 5.3 mEq/L — ABNORMAL HIGH (ref 3.5–5.1)
Sodium: 139 mEq/L (ref 135–145)
Total Bilirubin: 0.4 mg/dL (ref 0.2–1.2)
Total Protein: 6.9 g/dL (ref 6.0–8.3)

## 2015-12-11 LAB — HEMOGLOBIN A1C: Hgb A1c MFr Bld: 7.4 % — ABNORMAL HIGH (ref 4.6–6.5)

## 2015-12-11 LAB — TSH: TSH: 4.13 u[IU]/mL (ref 0.35–4.50)

## 2015-12-11 NOTE — Progress Notes (Signed)
Pre visit review using our clinic review tool, if applicable. No additional management support is needed unless otherwise documented below in the visit note. 

## 2015-12-11 NOTE — Patient Instructions (Signed)
Call insurance and confirm they will cover the Prevnar (PCV13) pneumonia shot. You appear to have had the Pneumovax shot (PCV23) back in 2014. If they do call for a nurse appt to get shot  Basic Carbohydrate Counting for Diabetes Mellitus Carbohydrate counting is a method for keeping track of the amount of carbohydrates you eat. Eating carbohydrates naturally increases the level of sugar (glucose) in your blood, so it is important for you to know the amount that is okay for you to have in every meal. Carbohydrate counting helps keep the level of glucose in your blood within normal limits. The amount of carbohydrates allowed is different for every person. A dietitian can help you calculate the amount that is right for you. Once you know the amount of carbohydrates you can have, you can count the carbohydrates in the foods you want to eat. Carbohydrates are found in the following foods:  Grains, such as breads and cereals.  Dried beans and soy products.  Starchy vegetables, such as potatoes, peas, and corn.  Fruit and fruit juices.  Milk and yogurt.  Sweets and snack foods, such as cake, cookies, candy, chips, soft drinks, and fruit drinks. CARBOHYDRATE COUNTING There are two ways to count the carbohydrates in your food. You can use either of the methods or a combination of both. Reading the "Nutrition Facts" on [place] The "Nutrition Facts" is an area that is included on the labels of almost all packaged food and beverages in the [place]. It includes the serving size of that food or beverage and information about the nutrients in each serving of the food, including the grams (g) of carbohydrate per serving.  Decide the number of servings of this food or beverage that you will be able to eat or drink. Multiply that number of servings by the number of grams of carbohydrate that is listed on the label for that serving. The total will be the amount of carbohydrates you will be having when  you eat or drink this food or beverage. Learning Standard Serving Sizes of Food When you eat food that is not packaged or does not include "Nutrition Facts" on the label, you need to measure the servings in order to count the amount of carbohydrates.A serving of most carbohydrate-rich foods contains about 15 g of carbohydrates. The following list includes serving sizes of carbohydrate-rich foods that provide 15 g ofcarbohydrate per serving:   1 slice of bread (1 oz) or 1 six-inch tortilla.    of a hamburger bun or English muffin.  4-6 crackers.   cup unsweetened dry cereal.    cup hot cereal.   cup rice or pasta.    cup mashed potatoes or  of a large baked potato.  1 cup fresh fruit or one small piece of fruit.    cup canned or frozen fruit or fruit juice.  1 cup milk.   cup plain fat-free yogurt or yogurt sweetened with artificial sweeteners.   cup cooked dried beans or starchy vegetable, such as peas, corn, or potatoes.  Decide the number of standard-size servings that you will eat. Multiply that number of servings by 15 (the grams of carbohydrates in that serving). For example, if you eat 2 cups of strawberries, you will have eaten 2 servings and 30 g of carbohydrates (2 servings x 15 g = 30 g). For foods such as soups and casseroles, in which more than one food is mixed in, you will need to count the carbohydrates in each  food that is included. EXAMPLE OF CARBOHYDRATE COUNTING Sample Dinner  3 oz chicken breast.   cup of brown rice.   cup of corn.  1 cup milk.   1 cup strawberries with sugar-free whipped topping.  Carbohydrate Calculation Step 1: Identify the foods that contain carbohydrates:   Rice.   Corn.   Milk.   Strawberries. Step 2:Calculate the number of servings eaten of each:   2 servings of rice.   1 serving of corn.   1 serving of milk.   1 serving of strawberries. Step 3: Multiply each of those number of servings  by 15 g:   2 servings of rice x 15 g = 30 g.   1 serving of corn x 15 g = 15 g.   1 serving of milk x 15 g = 15 g.   1 serving of strawberries x 15 g = 15 g. Step 4: Add together all of the amounts to find the total grams of carbohydrates eaten: 30 g + 15 g + 15 g + 15 g = 75 g.   This information is not intended to replace advice given to you by your health care provider. Make sure you discuss any questions you have with your health care provider.   Document Released: 12/12/2005 Document Revised: 01/02/2015 Document Reviewed: 11/08/2013 Elsevier Interactive Patient Education [employer].

## 2015-12-13 NOTE — Assessment & Plan Note (Signed)
Has been off of his Januvia due to his insurance making him unable to afford it. His hgba1c is up but tolerable encouraged to minimize simple carbs and continue current meds.

## 2015-12-13 NOTE — Progress Notes (Signed)
Subjective:    Patient ID: Jack Wade, male    DOB: 1941-07-01, 74 y.o.   MRN: [id number]  Chief Complaint  Patient presents with  . Follow-up    HPI Patient is in today for follow-up. He is feeling well. He has not been taking his Januvia for quite some time due to the cost. His insurance will no longer cover the medication. He denies polyuria or polydipsia. He's been trying to eat decrease carbs. He stays active. Denies CP/palp/SOB/HA/congestion/fevers/GI or GU c/o. Taking meds as prescribed  Past Medical History  Diagnosis Date  . Hyperlipidemia   . Hypertension   . CAD (coronary artery disease)     2 stents  . Diabetes type 2, controlled ([place])   . Chicken pox   . Measles   . Appendicitis   . Mumps as a child  . Melanoma ([place])     Scalp. 2012  . Hypothyroidism   . Thyroid disease     [place] Hyperthyroidism  . Dysphagia, unspecified(787.20) 03/20/2014  . Back pain 02/09/2015  . Aortic calcification ([place]) 02/09/2015  . Hereditary and idiopathic peripheral neuropathy 02/09/2015  . Medicare annual wellness visit, subsequent 06/14/2015    Past Surgical History  Procedure Laterality Date  . Appendectomy    . Coronary angioplasty with stent placement      2 stents  . Cardiac catheterization    . Back surgery      x2 lumbar  . Rotator cuff repair Right   . Wisdom tooth extraction    . Tonsillectomy and adenoidectomy    . Skin cancer excision      melanoma on scalp  . Back surgery      late 1990s had 2 surgeries, first surgery lifted a heavy engine ruptured disds, at L4 and L5, cleaned discs no hardware very helpful. 2 years later while recovering from angiogram with weights applied to left leg caused a recurrence and required surgery again in same area with disc repair  . Knee arthroscopy      right knee  . Lumbar laminectomy/decompression microdiscectomy Bilateral 04/10/2015    Procedure: LUMBAR LAMINECTOMY/DECOMPRESSION MICRODISCECTOMY 2 LEVELS;  Surgeon: Ashok Pall, MD;  Location: [facility];  Service: Neurosurgery;  Laterality: Bilateral;  Bilateral L45 L5S1 Laminectomy and Foraminotomy    Family History  Problem Relation Age of Onset  . Heart disease Father   . Emphysema Father   . Hypertension Father   . Cancer Father     lung/ healthy  . Heart disease Mother     Deceased  . Cancer Mother     breast  . Hypertension Mother   . Cancer Maternal Grandmother     colon  . Diabetes Brother     type 2  . Heart disease Brother   . Pulmonary embolism Son     vasculiti    Social History   Social History  . Marital Status: Married    Spouse Name: N/A  . Number of Children: 2  . Years of Education: N/A   Occupational History  . Not on file.   Social History Main Topics  . Smoking status: Former Smoker -- 2.00 packs/day for 30 years    Types: Cigarettes    Start date: 12/27/1983  . Smokeless tobacco: Never Used  . Alcohol Use: Yes     Comment: occasionally  . Drug Use: No  . 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  Other Topics Concern  . Not on file   Social History Narrative    Outpatient Prescriptions Prior to Visit  Medication Sig Dispense Refill  . aspirin 81 MG tablet Take 81 mg by mouth daily.    Mar[place] Kitchen glipiZIDE (GLUCOTROL) 5 MG tablet Take 2 tablets (10 mg total) by mouth 2 (two) times daily before a meal. 360 tablet 2  . levothyroxine (SYNTHROID, LEVOTHROID) 100 MCG tablet TAKE 1 TABLET (100 MCG TOTAL) BY MOUTH DAILY BEFORE BREAKFAST. 90 tablet 1  . metFORMIN (GLUCOPHAGE XR) 500 MG 24 hr tablet Take 2 tablets (1,000 mg total) by mouth 2 (two) times daily. Failed short acting Metformin with nausea 360 tablet 2  . simvastatin (ZOCOR) 40 MG tablet Take 1 tablet (40 mg total) by mouth daily. 90 tablet 2  . glipiZIDE (GLUCOTROL) 5 MG tablet TAKE 4 TABLETS (20 MG TOTAL) BY MOUTH 2 (TWO) TIMES DAILY BEFORE A MEAL. 720 tablet 1  . levothyroxine (SYNTHROID,  LEVOTHROID) 100 MCG tablet TAKE 1 TABLET (100 MCG TOTAL) BY MOUTH DAILY BEFORE BREAKFAST. 90 tablet 1  . meloxicam (MOBIC) 15 MG tablet Take 1 tablet (15 mg total) by mouth daily. 30 tablet 0  . sitaGLIPtin (JANUVIA) 100 MG tablet TAKE 1 TABLET (100 MG TOTAL) BY MOUTH DAILY. 90 tablet 2   No facility-administered medications prior to visit.    No Known Allergies  Review of Systems  Constitutional: Negative for fever and malaise/fatigue.  HENT: Negative for congestion.   Eyes: Negative for discharge.  Respiratory: Negative for shortness of breath.   Cardiovascular: Negative for chest pain, palpitations and leg swelling.  Gastrointestinal: Negative for nausea and abdominal pain.  Genitourinary: Negative for dysuria.  Musculoskeletal: Negative for falls.  Skin: Negative for rash.  Neurological: Negative for loss of consciousness and headaches.  Endo/Heme/Allergies: Negative for environmental allergies.  Psychiatric/Behavioral: Negative for depression. The patient is not nervous/anxious.        Objective:    Physical Exam  Constitutional: He is oriented to person, place, and time. He appears well-developed and well-nourished. No distress.  HENT:  Head: Normocephalic and atraumatic.  Right Ear: External ear normal.  Left Ear: External ear normal.  Nose: Nose normal.  Mouth/Throat: Oropharynx is clear and moist. No oropharyngeal exudate.  Eyes: Conjunctivae and EOM are normal. Pupils are equal, round, and reactive to light. Right eye exhibits no discharge. Left eye exhibits no discharge.  Neck: Normal range of motion. Neck supple. No JVD present. No thyromegaly present.  Cardiovascular: Normal rate, regular rhythm and intact distal pulses.   No murmur heard. Pulmonary/Chest: Effort normal and breath sounds normal. No respiratory distress. He has no wheezes. He has no rales. He exhibits no tenderness.  Abdominal: Soft. Bowel sounds are normal. He exhibits no distension and no mass.  There is no tenderness. There is no rebound and no guarding.  Genitourinary: Prostate normal and penis normal.  Musculoskeletal: Normal range of motion. He exhibits no edema or tenderness.  Lymphadenopathy:    He has no cervical adenopathy.  Neurological: He is alert and oriented to person, place, and time. He displays normal reflexes. No cranial nerve deficit. He exhibits normal muscle tone.  Skin: Skin is warm and dry. No rash noted. He is not diaphoretic. No erythema.  Psychiatric: He has a normal mood and affect. His behavior is normal. Judgment and thought content normal.  Nursing note and vitals reviewed.   BP 138/69 mmHg  Pulse 69  Temp(Src) 98.2 F (36.8 C) (Oral)  Ht 6' (1.829 m)  Wt 221 lb (100.245 kg)  BMI 29.97 kg/m2  SpO2 100% Wt Readings from Last 3 Encounters:  12/11/15 221 lb (100.245 kg)  07/14/15 219 lb 6.4 oz (99.519 kg)  06/11/15 218 lb 2 oz (98.941 kg)     Lab Results  Component Value Date   WBC 6.9 12/11/2015   HGB 14.0 12/11/2015   HCT 42.2 12/11/2015   PLT 266.0 12/11/2015   GLUCOSE 249* 12/11/2015   CHOL 116 12/11/2015   TRIG 165.0* 12/11/2015   HDL 38.50* 12/11/2015   LDLDIRECT 78.1 11/10/2014   LDLCALC 45 12/11/2015   ALT 17 12/11/2015   AST 22 12/11/2015   NA 139 12/11/2015   K 5.3* 12/11/2015   CL 106 12/11/2015   CREATININE 1.13 12/11/2015   BUN 13 12/11/2015   CO2 28 12/11/2015   TSH 4.13 12/11/2015   HGBA1C 7.4* 12/11/2015   MICROALBUR <0.7 03/03/2015    Lab Results  Component Value Date   TSH 4.13 12/11/2015   Lab Results  Component Value Date   WBC 6.9 12/11/2015   HGB 14.0 12/11/2015   HCT 42.2 12/11/2015   MCV 84.2 12/11/2015   PLT 266.0 12/11/2015   Lab Results  Component Value Date   NA 139 12/11/2015   K 5.3* 12/11/2015   CO2 28 12/11/2015   GLUCOSE 249* 12/11/2015   BUN 13 12/11/2015   CREATININE 1.13 12/11/2015   BILITOT 0.4 12/11/2015   ALKPHOS 67 12/11/2015   AST 22 12/11/2015   ALT 17 12/11/2015    PROT 6.9 12/11/2015   ALBUMIN 4.1 12/11/2015   CALCIUM 9.5 12/11/2015   ANIONGAP 10 04/08/2015   GFR 67.60 12/11/2015   Lab Results  Component Value Date   CHOL 116 12/11/2015   Lab Results  Component Value Date   HDL 38.50* 12/11/2015   Lab Results  Component Value Date   LDLCALC 45 12/11/2015   Lab Results  Component Value Date   TRIG 165.0* 12/11/2015   Lab Results  Component Value Date   CHOLHDL 3 12/11/2015   Lab Results  Component Value Date   HGBA1C 7.4* 12/11/2015       Assessment & Plan:   Problem List Items Addressed This Visit    Diabetes ([place]) - Primary    Has been off of his Januvia due to his insurance making him unable to afford it. His hgba1c is up but tolerable encouraged to minimize simple carbs and continue current meds.      Relevant Orders   TSH (Completed)   CBC (Completed)   Lipid panel (Completed)   Hemoglobin A1c (Completed)   Comprehensive metabolic panel (Completed)   HTN (hypertension)   Relevant Orders   TSH (Completed)   CBC (Completed)   Lipid panel (Completed)   Hemoglobin A1c (Completed)   Comprehensive metabolic panel (Completed)   Hyperlipidemia    Tolerating statin, encouraged heart healthy diet, avoid trans fats, minimize simple carbs and saturated fats. Increase exercise as tolerated      Relevant Orders   TSH (Completed)   CBC (Completed)   Lipid panel (Completed)   Hemoglobin A1c (Completed)   Comprehensive metabolic panel (Completed)   Hypothyroidism    On Levothyroxine, continue to monitor      Relevant Orders   TSH (Completed)   CBC (Completed)   Lipid panel (Completed)   Hemoglobin A1c (Completed)   Comprehensive metabolic panel (Completed)      I have discontinued Mr. Kempe sitaGLIPtin and  meloxicam. I am also having him maintain his aspirin, levothyroxine, glipiZIDE, metFORMIN, and simvastatin.  No orders of the defined types were placed in this encounter.     Penni Homans, MD

## 2015-12-13 NOTE — Assessment & Plan Note (Signed)
On Levothyroxine, continue to monitor 

## 2015-12-13 NOTE — Assessment & Plan Note (Signed)
Tolerating statin, encouraged heart healthy diet, avoid trans fats, minimize simple carbs and saturated fats. Increase exercise as tolerated 

## 2015-12-14 ENCOUNTER — Other Ambulatory Visit: Payer: Self-pay | Admitting: Family Medicine

## 2015-12-14 DIAGNOSIS — E876 Hypokalemia: Secondary | ICD-10-CM

## 2015-12-15 ENCOUNTER — Encounter: Payer: Self-pay | Admitting: Family Medicine

## 2015-12-22 ENCOUNTER — Other Ambulatory Visit (INDEPENDENT_AMBULATORY_CARE_PROVIDER_SITE_OTHER): Payer: Medicare Other

## 2015-12-22 DIAGNOSIS — E876 Hypokalemia: Secondary | ICD-10-CM | POA: Diagnosis not present

## 2015-12-22 LAB — COMPREHENSIVE METABOLIC PANEL
ALT: 22 U/L (ref 0–53)
AST: 24 U/L (ref 0–37)
Albumin: 4.3 g/dL (ref 3.5–5.2)
Alkaline Phosphatase: 73 U/L (ref 39–117)
BUN: 16 mg/dL (ref 6–23)
CO2: 27 mEq/L (ref 19–32)
Calcium: 9.6 mg/dL (ref 8.4–10.5)
Chloride: 103 mEq/L (ref 96–112)
Creatinine, Ser: 1.07 mg/dL (ref 0.40–1.50)
GFR: 71.99 mL/min (ref >60.00)
Glucose, Bld: 145 mg/dL — ABNORMAL HIGH (ref 70–99)
Potassium: 4.4 mEq/L (ref 3.5–5.1)
Sodium: 138 mEq/L (ref 135–145)
Total Bilirubin: 0.5 mg/dL (ref 0.2–1.2)
Total Protein: 7.1 g/dL (ref 6.0–8.3)

## 2016-01-12 DIAGNOSIS — M25562 Pain in left knee: Secondary | ICD-10-CM | POA: Diagnosis not present

## 2016-02-24 DIAGNOSIS — Z08 Encounter for follow-up examination after completed treatment for malignant neoplasm: Secondary | ICD-10-CM | POA: Diagnosis not present

## 2016-02-24 DIAGNOSIS — Z85820 Personal history of malignant melanoma of skin: Secondary | ICD-10-CM | POA: Diagnosis not present

## 2016-02-29 ENCOUNTER — Telehealth: Payer: Self-pay | Admitting: Family Medicine

## 2016-02-29 NOTE — Telephone Encounter (Signed)
OK to give Diazepam 10 mg tab 1/2 to 1 tab daily prn anxiety, MRI. Disp #10

## 2016-02-29 NOTE — Telephone Encounter (Signed)
Relation to [id number] Call back number: [phone]  Pharmacy: [id number] - [place], [place] [phone] (Phone) [phone] (Fax)         Reason for call:  Patient states she is scheduled for MRI this Saturday 03/05/16 and requesting valium.patient is claustrophobic.

## 2016-03-01 MED ORDER — DIAZEPAM 10 MG PO TABS: ORAL_TABLET | ORAL | Status: DC

## 2016-03-01 NOTE — Telephone Encounter (Signed)
This information was documented in the wrong chart.   The phone call was from this patient but message regarding his wife Denver Kibbey (dob-10/14/42 and [id number]) Will resend request in the correct chart for PCP to review.  Will forward this message to managers.

## 2016-03-03 ENCOUNTER — Other Ambulatory Visit: Payer: Self-pay | Admitting: Family Medicine

## 2016-03-22 NOTE — Progress Notes (Signed)
HPI: FU coronary artery disease. Carotid Dopplers in August of 2013 showed no significant disease. Patient had PCI in [place] approximately 15 years ago. Records are not available. Nuclear study May 2015 showed an ejection fraction of 61%. There is a fixed defect in the basal and mid lateral wall but no ischemia. Patient did have ST changes. Lower extremity Dopplers February 2016 showed an occluded right anterior tibial artery. Abdominal ultrasound February 2016 showed no aneurysm. Since last seen,   Current Outpatient Prescriptions  Medication Sig Dispense Refill  . aspirin 81 MG tablet Take 81 mg by mouth daily.    . diazepam (VALIUM) 10 MG tablet Take 1/2 to 1 tablet as needed for  MRI 10 tablet 0  . glipiZIDE (GLUCOTROL) 5 MG tablet Take 2 tablets (10 mg total) by mouth 2 (two) times daily before a meal. 360 tablet 2  . levothyroxine (SYNTHROID, LEVOTHROID) 100 MCG tablet TAKE 1 TABLET (100 MCG TOTAL) BY MOUTH DAILY BEFORE BREAKFAST. 90 tablet 1  . metFORMIN (GLUCOPHAGE-XR) 500 MG 24 hr tablet TAKE 2 TABLETS BY MOUTH TWICE A DAY 360 tablet 2  . simvastatin (ZOCOR) 40 MG tablet Take 1 tablet (40 mg total) by mouth daily. 90 tablet 2   No current facility-administered medications for this visit.     Past Medical History  Diagnosis Date  . Hyperlipidemia   . Hypertension   . CAD (coronary artery disease)     2 stents  . Diabetes type 2, controlled ([place])   . Chicken pox   . Measles   . Appendicitis   . Mumps as a child  . Melanoma ([place])     Scalp. 2012  . Hypothyroidism   . Thyroid disease     [place] Hyperthyroidism  . Dysphagia, unspecified(787.20) 03/20/2014  . Back pain 02/09/2015  . Aortic calcification ([place]) 02/09/2015  . Hereditary and idiopathic peripheral neuropathy 02/09/2015  . Medicare annual wellness visit, subsequent 06/14/2015    Past Surgical History  Procedure Laterality Date  . Appendectomy    . Coronary angioplasty with stent placement      2 stents    . Cardiac catheterization    . Back surgery      x2 lumbar  . Rotator cuff repair Right   . Wisdom tooth extraction    . Tonsillectomy and adenoidectomy    . Skin cancer excision      melanoma on scalp  . Back surgery      late 1990s had 2 surgeries, first surgery lifted a heavy engine ruptured disds, at L4 and L5, cleaned discs no hardware very helpful. 2 years later while recovering from angiogram with weights applied to left leg caused a recurrence and required surgery again in same area with disc repair  . Knee arthroscopy      right knee  . Lumbar laminectomy/decompression microdiscectomy Bilateral 04/10/2015    Procedure: LUMBAR LAMINECTOMY/DECOMPRESSION MICRODISCECTOMY 2 LEVELS;  Surgeon: Ashok Pall, MD;  Location: [facility];  Service: Neurosurgery;  Laterality: Bilateral;  Bilateral L45 L5S1 Laminectomy and Foraminotomy    Social History   Social History  . Marital Status: Married    Spouse Name: N/A  . Number of Children: 2  . Years of Education: N/A   Occupational History  . Not on file.   Social History Main Topics  . Smoking status: Former Smoker -- 2.00 packs/day for 30 years    Types: Cigarettes    Start date: 12/27/1983  . Smokeless tobacco:  Never Used  . Alcohol Use: Yes     Comment: occasionally  . Drug Use: No  . 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    Other Topics Concern  . Not on file   Social History Narrative    Family History  Problem Relation Age of Onset  . Heart disease Father   . Emphysema Father   . Hypertension Father   . Cancer Father     lung/ healthy  . Heart disease Mother     Deceased  . Cancer Mother     breast  . Hypertension Mother   . Cancer Maternal Grandmother     colon  . Diabetes Brother     type 2  . Heart disease Brother   . Pulmonary embolism Son     vasculiti    ROS: no fevers or chills, productive cough, hemoptysis, dysphasia, odynophagia,  melena, hematochezia, dysuria, hematuria, rash, seizure activity, orthopnea, PND, pedal edema, claudication. Remaining systems are negative.  Physical Exam: Well-developed well-nourished in no acute distress.  Skin is warm and dry.  HEENT is normal.  Neck is supple.  Chest is clear to auscultation with normal expansion.  Cardiovascular exam is regular rate and rhythm.  Abdominal exam nontender or distended. No masses palpated. Extremities show no edema. neuro grossly intact  ECG     This encounter was created in error - please disregard.

## 2016-03-23 ENCOUNTER — Encounter: Payer: Medicare Other | Admitting: Cardiology

## 2016-04-11 ENCOUNTER — Ambulatory Visit (INDEPENDENT_AMBULATORY_CARE_PROVIDER_SITE_OTHER): Payer: Medicare Other | Admitting: Family Medicine

## 2016-04-11 ENCOUNTER — Encounter: Payer: Self-pay | Admitting: Family Medicine

## 2016-04-11 VITALS — BP 120/72 | HR 67 | Temp 97.9°F | Ht 72.0 in | Wt 213.4 lb

## 2016-04-11 DIAGNOSIS — E785 Hyperlipidemia, unspecified: Secondary | ICD-10-CM

## 2016-04-11 DIAGNOSIS — E038 Other specified hypothyroidism: Secondary | ICD-10-CM

## 2016-04-11 DIAGNOSIS — M25572 Pain in left ankle and joints of left foot: Secondary | ICD-10-CM

## 2016-04-11 DIAGNOSIS — E119 Type 2 diabetes mellitus without complications: Secondary | ICD-10-CM

## 2016-04-11 DIAGNOSIS — I2510 Atherosclerotic heart disease of native coronary artery without angina pectoris: Secondary | ICD-10-CM

## 2016-04-11 DIAGNOSIS — E669 Obesity, unspecified: Secondary | ICD-10-CM

## 2016-04-11 DIAGNOSIS — I10 Essential (primary) hypertension: Secondary | ICD-10-CM

## 2016-04-11 DIAGNOSIS — E118 Type 2 diabetes mellitus with unspecified complications: Secondary | ICD-10-CM | POA: Insufficient documentation

## 2016-04-11 DIAGNOSIS — E1169 Type 2 diabetes mellitus with other specified complication: Secondary | ICD-10-CM

## 2016-04-11 DIAGNOSIS — M79602 Pain in left arm: Secondary | ICD-10-CM

## 2016-04-11 HISTORY — DX: Type 2 diabetes mellitus with other specified complication: E66.9

## 2016-04-11 HISTORY — DX: Type 2 diabetes mellitus with other specified complication: E11.69

## 2016-04-11 LAB — CBC
HCT: 44.2 % (ref 39.0–52.0)
Hemoglobin: 14.9 g/dL (ref 13.0–17.0)
MCHC: 33.8 g/dL (ref 30.0–36.0)
MCV: 82.4 fl (ref 78.0–100.0)
Platelets: 257 10*3/uL (ref 150.0–400.0)
RBC: 5.36 Mil/uL (ref 4.22–5.81)
RDW: 13.5 % (ref 11.5–15.5)
WBC: 10.5 10*3/uL (ref 4.0–10.5)

## 2016-04-11 LAB — COMPREHENSIVE METABOLIC PANEL
ALT: 17 U/L (ref 0–53)
AST: 20 U/L (ref 0–37)
Albumin: 4.6 g/dL (ref 3.5–5.2)
Alkaline Phosphatase: 68 U/L (ref 39–117)
BUN: 16 mg/dL (ref 6–23)
CO2: 24 mEq/L (ref 19–32)
Calcium: 10.2 mg/dL (ref 8.4–10.5)
Chloride: 106 mEq/L (ref 96–112)
Creatinine, Ser: 1.19 mg/dL (ref 0.40–1.50)
GFR: 63.62 mL/min (ref >60.00)
Glucose, Bld: 114 mg/dL — ABNORMAL HIGH (ref 70–99)
Potassium: 4.8 mEq/L (ref 3.5–5.1)
Sodium: 140 mEq/L (ref 135–145)
Total Bilirubin: 0.3 mg/dL (ref 0.2–1.2)
Total Protein: 7.7 g/dL (ref 6.0–8.3)

## 2016-04-11 LAB — LIPID PANEL
Cholesterol: 122 mg/dL (ref 0–200)
HDL: 39.3 mg/dL (ref >39.00)
NonHDL: 82.6
Total CHOL/HDL Ratio: 3
Triglycerides: 308 mg/dL — ABNORMAL HIGH (ref 0.0–149.0)
VLDL: 61.6 mg/dL — ABNORMAL HIGH (ref 0.0–40.0)

## 2016-04-11 LAB — TSH: TSH: 3.31 u[IU]/mL (ref 0.35–4.50)

## 2016-04-11 LAB — LDL CHOLESTEROL, DIRECT: Direct LDL: 43 mg/dL

## 2016-04-11 LAB — HEMOGLOBIN A1C: Hgb A1c MFr Bld: 7.6 % — ABNORMAL HIGH (ref 4.6–6.5)

## 2016-04-11 LAB — MICROALBUMIN / CREATININE URINE RATIO
Creatinine,U: 205.2 mg/dL
Microalb Creat Ratio: 0.3 mg/g (ref 0.0–30.0)
Microalb, Ur: <0.7 mg/dL (ref 0.0–1.9)

## 2016-04-11 NOTE — Patient Instructions (Addendum)
Salon Pas patch to hip twice daily and Salon Pas gel or aspercreme to arm twice daily call for referral if worse or not improving   Sacroiliac Joint Dysfunction Sacroiliac joint dysfunction is a condition that causes inflammation on one or both sides of the sacroiliac (SI) joint. The SI joint connects the lower part of the spine (sacrum) with the two upper portions of the pelvis (ilium). This condition causes deep aching or burning pain in the low back. In some cases, the pain may also spread into one or both buttocks or hips or spread down the legs. CAUSES This condition may be caused by:  Pregnancy. During pregnancy, extra stress is put on the SI joints because the pelvis widens.  Injury, such as:  Car accidents.  Sport-related injuries.  Work-related injuries.  Having one leg that is shorter than the other.  Conditions that affect the joints, such as:  Rheumatoid arthritis.  Gout.  Psoriatic arthritis.  Joint infection (septic arthritis). Sometimes, the cause of SI joint dysfunction is not known. SYMPTOMS Symptoms of this condition include:  Aching or burning pain in the lower back. The pain may also spread to other areas, such as:  Buttocks.  Groin.  Thighs and legs.  Muscle spasms in or around the painful areas.  Increased pain when standing, walking, running, stair climbing, bending, or lifting. DIAGNOSIS Your health care provider will do a physical exam and take your medical history. During the exam, the health care provider may move one or both of your legs to different positions to check for pain. Various tests may be done to help verify the diagnosis, including:  Imaging tests to look for other causes of pain. These may include:  MRI.  CT scan.  Bone scan.  Diagnostic injection. A numbing medicine is injected into the SI joint using a needle. If the pain is temporarily improved or stopped after the injection, this can indicate that SI joint dysfunction  is the problem. TREATMENT Treatment may vary depending on the cause and severity of your condition. Treatment options may include:  Applying ice or heat to the lower back area. This can help to reduce pain and muscle spasms.  Medicines to relieve pain or inflammation or to relax the muscles.  Wearing a back brace (sacroiliac brace) to help support the joint while your back is healing.  Physical therapy to increase muscle strength around the joint and flexibility at the joint. This may also involve learning proper body positions and ways of moving to relieve stress on the joint.  Direct manipulation of the SI joint.  Injections of steroid medicine into the joint in order to reduce pain and swelling.  Radiofrequency ablation to burn away nerves that are carrying pain messages from the joint.  Use of a device that provides electrical stimulation in order to reduce pain at the joint.  Surgery to put in screws and plates that limit or prevent joint motion. This is rare. HOME CARE INSTRUCTIONS  Rest as needed. Limit your activities as directed by your health care provider.  Take medicines only as directed by your health care provider.  If directed, apply ice to the affected area:  Put ice in a plastic bag.  Place a towel between your skin and the bag.  Leave the ice on for 20 minutes, 2-3 times per day.  Use a heating pad or a moist heat pack as directed by your health care provider.  Exercise as directed by your health care provider or  physical therapist.  Keep all follow-up visits as directed by your health care provider. This is important. SEEK MEDICAL CARE IF:  Your pain is not controlled with medicine.  You have a fever.  You have increasingly severe pain. SEEK IMMEDIATE MEDICAL CARE IF:  You have weakness, numbness, or tingling in your legs or feet.  You lose control of your bladder or bowel.   This information is not intended to replace advice given to you by  your health care provider. Make sure you discuss any questions you have with your health care provider.   Document Released: 03/10/2009 Document Revised: 04/28/2015 Document Reviewed: 08/19/2014 Elsevier Interactive Patient Education [employer].

## 2016-04-11 NOTE — Progress Notes (Signed)
Pre visit review using our clinic review tool, if applicable. No additional management support is needed unless otherwise documented below in the visit note. 

## 2016-04-11 NOTE — Assessment & Plan Note (Signed)
Well controlled, no changes to meds. Encouraged heart healthy diet such as the DASH diet and exercise as tolerated.  °

## 2016-04-11 NOTE — Assessment & Plan Note (Addendum)
On Levothyroxine, continue to monitor 

## 2016-04-11 NOTE — Assessment & Plan Note (Signed)
Tolerating statin, encouraged heart healthy diet, avoid trans fats, minimize simple carbs and saturated fats. Increase exercise as tolerated 

## 2016-04-11 NOTE — Progress Notes (Signed)
Patient ID: Jack Wade, male   DOB: 1941/03/10, 75 y.o.   MRN: [id number]   Subjective:    Patient ID: Jack Wade, male    DOB: 1941-10-12, 75 y.o.   MRN: [id number]  Chief Complaint  Patient presents with  . Follow-up    HPI Patient is in today for follow up. No recent illness but is noting considerable issues with pain. Has noted some right arm pain with some mild weakness. No trauma or injury. Also notes some right hip but no weakness. Had surgery with neurosurgery last year and has been doing better mostly. Denies CP/palp/SOB/HA/congestion/fevers/GI or GU c/o. Taking meds as prescribed  Past Medical History  Diagnosis Date  . Hyperlipidemia   . Hypertension   . CAD (coronary artery disease)     2 stents  . Diabetes type 2, controlled ([place])   . Chicken pox   . Measles   . Appendicitis   . Mumps as a child  . Melanoma ([place])     Scalp. 2012  . Hypothyroidism   . Thyroid disease     [place] Hyperthyroidism  . Dysphagia, unspecified(787.20) 03/20/2014  . Back pain 02/09/2015  . Aortic calcification ([place]) 02/09/2015  . Hereditary and idiopathic peripheral neuropathy 02/09/2015  . Medicare annual wellness visit, subsequent 06/14/2015  . Diabetes mellitus type 2 in obese ([place]) 04/11/2016    Past Surgical History  Procedure Laterality Date  . Appendectomy    . Coronary angioplasty with stent placement      2 stents  . Cardiac catheterization    . Back surgery      x2 lumbar  . Rotator cuff repair Right   . Wisdom tooth extraction    . Tonsillectomy and adenoidectomy    . Skin cancer excision      melanoma on scalp  . Back surgery      late 1990s had 2 surgeries, first surgery lifted a heavy engine ruptured disds, at L4 and L5, cleaned discs no hardware very helpful. 2 years later while recovering from angiogram with weights applied to left leg caused a recurrence and required surgery again in same area with disc repair  . Knee arthroscopy      right knee  .  Lumbar laminectomy/decompression microdiscectomy Bilateral 04/10/2015    Procedure: LUMBAR LAMINECTOMY/DECOMPRESSION MICRODISCECTOMY 2 LEVELS;  Surgeon: Ashok Pall, MD;  Location: [facility];  Service: Neurosurgery;  Laterality: Bilateral;  Bilateral L45 L5S1 Laminectomy and Foraminotomy    Family History  Problem Relation Age of Onset  . Heart disease Father   . Emphysema Father   . Hypertension Father   . Cancer Father     lung/ healthy  . Heart disease Mother     Deceased  . Cancer Mother     breast  . Hypertension Mother   . Cancer Maternal Grandmother     colon  . Diabetes Brother     type 2  . Heart disease Brother   . Pulmonary embolism Son     vasculiti    Social History   Social History  . Marital Status: Married    Spouse Name: N/A  . Number of Children: 2  . Years of Education: N/A   Occupational History  . Not on file.   Social History Main Topics  . Smoking status: Former Smoker -- 2.00 packs/day for 30 years    Types: Cigarettes    Start date: 12/27/1983  . Smokeless tobacco: Never Used  . Alcohol Use: Yes  Comment: occasionally  . Drug Use: No  . 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    Other Topics Concern  . Not on file   Social History Narrative    Outpatient Prescriptions Prior to Visit  Medication Sig Dispense Refill  . aspirin 81 MG tablet Take 81 mg by mouth daily.    Marland Kitchen glipiZIDE (GLUCOTROL) 5 MG tablet Take 2 tablets (10 mg total) by mouth 2 (two) times daily before a meal. 360 tablet 2  . levothyroxine (SYNTHROID, LEVOTHROID) 100 MCG tablet TAKE 1 TABLET (100 MCG TOTAL) BY MOUTH DAILY BEFORE BREAKFAST. 90 tablet 1  . metFORMIN (GLUCOPHAGE-XR) 500 MG 24 hr tablet TAKE 2 TABLETS BY MOUTH TWICE A DAY 360 tablet 2  . simvastatin (ZOCOR) 40 MG tablet Take 1 tablet (40 mg total) by mouth daily. 90 tablet 2  . diazepam (VALIUM) 10 MG tablet Take 1/2 to 1 tablet as needed for   MRI 10 tablet 0   No facility-administered medications prior to visit.    No Known Allergies  Review of Systems  Constitutional: Negative for fever and malaise/fatigue.  HENT: Negative for congestion.   Eyes: Negative for blurred vision.  Respiratory: Negative for shortness of breath.   Cardiovascular: Negative for chest pain, palpitations and leg swelling.  Gastrointestinal: Negative for nausea, abdominal pain and blood in stool.  Genitourinary: Negative for dysuria and frequency.  Musculoskeletal: Positive for myalgias and joint pain. Negative for falls.  Skin: Negative for rash.  Neurological: Negative for dizziness, loss of consciousness and headaches.  Endo/Heme/Allergies: Negative for environmental allergies.  Psychiatric/Behavioral: Negative for depression. The patient is not nervous/anxious.        Objective:    Physical Exam  Constitutional: He is oriented to person, place, and time. He appears well-developed and well-nourished. No distress.  HENT:  Head: Normocephalic and atraumatic.  Nose: Nose normal.  Eyes: Right eye exhibits no discharge. Left eye exhibits no discharge.  Neck: Normal range of motion. Neck supple.  Cardiovascular: Normal rate and regular rhythm.   No murmur heard. Pulmonary/Chest: Effort normal and breath sounds normal.  Abdominal: Soft. Bowel sounds are normal. There is no tenderness.  Musculoskeletal: He exhibits no edema.  Neurological: He is alert and oriented to person, place, and time.  Skin: Skin is warm and dry.  Psychiatric: He has a normal mood and affect.  Nursing note and vitals reviewed.   BP 120/72 mmHg  Pulse 67  Temp(Src) 97.9 F (36.6 C) (Oral)  Ht 6' (1.829 m)  Wt 213 lb 6 oz (96.786 kg)  BMI 28.93 kg/m2  SpO2 96% Wt Readings from Last 3 Encounters:  04/13/16 214 lb 6.4 oz (97.251 kg)  04/11/16 213 lb 6 oz (96.786 kg)  12/11/15 221 lb (100.245 kg)     Lab Results  Component Value Date   WBC 10.5 04/11/2016    HGB 14.9 04/11/2016   HCT 44.2 04/11/2016   PLT 257.0 04/11/2016   GLUCOSE 114* 04/11/2016   CHOL 122 04/11/2016   TRIG 308.0* 04/11/2016   HDL 39.30 04/11/2016   LDLDIRECT 43.0 04/11/2016   LDLCALC 45 12/11/2015   ALT 17 04/11/2016   AST 20 04/11/2016   NA 140 04/11/2016   K 4.8 04/11/2016   CL 106 04/11/2016   CREATININE 1.19 04/11/2016   BUN 16 04/11/2016   CO2 24 04/11/2016   TSH 3.31 04/11/2016   HGBA1C 7.6* 04/11/2016   MICROALBUR <0.7 04/11/2016  Lab Results  Component Value Date   TSH 3.31 04/11/2016   Lab Results  Component Value Date   WBC 10.5 04/11/2016   HGB 14.9 04/11/2016   HCT 44.2 04/11/2016   MCV 82.4 04/11/2016   PLT 257.0 04/11/2016   Lab Results  Component Value Date   NA 140 04/11/2016   K 4.8 04/11/2016   CO2 24 04/11/2016   GLUCOSE 114* 04/11/2016   BUN 16 04/11/2016   CREATININE 1.19 04/11/2016   BILITOT 0.3 04/11/2016   ALKPHOS 68 04/11/2016   AST 20 04/11/2016   ALT 17 04/11/2016   PROT 7.7 04/11/2016   ALBUMIN 4.6 04/11/2016   CALCIUM 10.2 04/11/2016   ANIONGAP 10 04/08/2015   GFR 63.62 04/11/2016   Lab Results  Component Value Date   CHOL 122 04/11/2016   Lab Results  Component Value Date   HDL 39.30 04/11/2016   Lab Results  Component Value Date   LDLCALC 45 12/11/2015   Lab Results  Component Value Date   TRIG 308.0* 04/11/2016   Lab Results  Component Value Date   CHOLHDL 3 04/11/2016   Lab Results  Component Value Date   HGBA1C 7.6* 04/11/2016       Assessment & Plan:   Problem List Items Addressed This Visit    Diabetes mellitus type 2 in obese ([place])    hgba1c acceptable, minimize simple carbs. Increase exercise as tolerated. Continue current meds      Relevant Orders   CBC (Completed)   TSH (Completed)   Hemoglobin A1c (Completed)   Comprehensive metabolic panel (Completed)   Lipid panel (Completed)   Microalbumin / creatinine urine ratio (Completed)   HTN (hypertension) - Primary    Well  controlled, no changes to meds. Encouraged heart healthy diet such as the DASH diet and exercise as tolerated.       Relevant Orders   CBC (Completed)   TSH (Completed)   Hemoglobin A1c (Completed)   Comprehensive metabolic panel (Completed)   Lipid panel (Completed)   Microalbumin / creatinine urine ratio (Completed)   Hyperlipidemia    Tolerating statin, encouraged heart healthy diet, avoid trans fats, minimize simple carbs and saturated fats. Increase exercise as tolerated      Relevant Orders   CBC (Completed)   TSH (Completed)   Hemoglobin A1c (Completed)   Comprehensive metabolic panel (Completed)   Lipid panel (Completed)   Microalbumin / creatinine urine ratio (Completed)   Hypothyroidism    On Levothyroxine, continue to monitor      Relevant Orders   CBC (Completed)   TSH (Completed)   Hemoglobin A1c (Completed)   Comprehensive metabolic panel (Completed)   Lipid panel (Completed)   Microalbumin / creatinine urine ratio (Completed)   Left arm pain    Encouraged activity as tolerated, moist heat and topical treatments, notify us for referral if worsens.       Pain in joint, ankle and foot    Moist heat and gentle stretching, referred to ortho for consideration         I have discontinued Mr. Kristiansen diazepam. I am also having him maintain his aspirin, levothyroxine, glipiZIDE, simvastatin, and metFORMIN.  No orders of the defined types were placed in this encounter.     Penni Homans, MD

## 2016-04-12 NOTE — Progress Notes (Signed)
HPI: FU coronary artery disease. Carotid Dopplers in August of 2013 showed no significant disease. Patient had PCI in [place] approximately 15 years ago. Records are not available. Nuclear study May 2015 showed an ejection fraction of 61%. There is a fixed defect in the basal and mid lateral wall but no ischemia. Patient did have ST changes. Lower extremity Dopplers February 2016 showed an occluded right anterior tibial artery. Abdominal ultrasound February 2016 showed no aneurysm. Since last seen, Patient notices a burning sensation in his upper chest when he initiates ambulation but it improves as he continues to walk. He does not notice this with climbing stairs or other activities. He denies dyspnea, palpitations or syncope.  Current Outpatient Prescriptions  Medication Sig Dispense Refill  . aspirin 81 MG tablet Take 81 mg by mouth daily.    Marland Kitchen glipiZIDE (GLUCOTROL) 5 MG tablet Take 2 tablets (10 mg total) by mouth 2 (two) times daily before a meal. 360 tablet 2  . levothyroxine (SYNTHROID, LEVOTHROID) 100 MCG tablet TAKE 1 TABLET (100 MCG TOTAL) BY MOUTH DAILY BEFORE BREAKFAST. 90 tablet 1  . metFORMIN (GLUCOPHAGE-XR) 500 MG 24 hr tablet TAKE 2 TABLETS BY MOUTH TWICE A DAY 360 tablet 2  . simvastatin (ZOCOR) 40 MG tablet Take 1 tablet (40 mg total) by mouth daily. 90 tablet 2   No current facility-administered medications for this visit.     Past Medical History  Diagnosis Date  . Hyperlipidemia   . Hypertension   . CAD (coronary artery disease)     2 stents  . Diabetes type 2, controlled ([place])   . Chicken pox   . Measles   . Appendicitis   . Mumps as a child  . Melanoma ([place])     Scalp. 2012  . Hypothyroidism   . Thyroid disease     [place] Hyperthyroidism  . Dysphagia, unspecified(787.20) 03/20/2014  . Back pain 02/09/2015  . Aortic calcification ([place]) 02/09/2015  . Hereditary and idiopathic peripheral neuropathy 02/09/2015  . Medicare annual wellness visit, subsequent  06/14/2015  . Diabetes mellitus type 2 in obese ([place]) 04/11/2016    Past Surgical History  Procedure Laterality Date  . Appendectomy    . Coronary angioplasty with stent placement      2 stents  . Cardiac catheterization    . Back surgery      x2 lumbar  . Rotator cuff repair Right   . Wisdom tooth extraction    . Tonsillectomy and adenoidectomy    . Skin cancer excision      melanoma on scalp  . Back surgery      late 1990s had 2 surgeries, first surgery lifted a heavy engine ruptured disds, at L4 and L5, cleaned discs no hardware very helpful. 2 years later while recovering from angiogram with weights applied to left leg caused a recurrence and required surgery again in same area with disc repair  . Knee arthroscopy      right knee  . Lumbar laminectomy/decompression microdiscectomy Bilateral 04/10/2015    Procedure: LUMBAR LAMINECTOMY/DECOMPRESSION MICRODISCECTOMY 2 LEVELS;  Surgeon: Ashok Pall, MD;  Location: [facility];  Service: Neurosurgery;  Laterality: Bilateral;  Bilateral L45 L5S1 Laminectomy and Foraminotomy    Social History   Social History  . Marital Status: Married    Spouse Name: N/A  . Number of Children: 2  . Years of Education: N/A   Occupational History  . Not on file.   Social History Main Topics  .  Smoking status: Former Smoker -- 2.00 packs/day for 30 years    Types: Cigarettes    Start date: 12/27/1983  . Smokeless tobacco: Never Used  . Alcohol Use: Yes     Comment: occasionally  . Drug Use: No  . 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    Other Topics Concern  . Not on file   Social History Narrative    Family History  Problem Relation Age of Onset  . Heart disease Father   . Emphysema Father   . Hypertension Father   . Cancer Father     lung/ healthy  . Heart disease Mother     Deceased  . Cancer Mother     breast  . Hypertension Mother   . Cancer Maternal Grandmother      colon  . Diabetes Brother     type 2  . Heart disease Brother   . Pulmonary embolism Son     vasculiti    ROS: no fevers or chills, productive cough, hemoptysis, dysphasia, odynophagia, melena, hematochezia, dysuria, hematuria, rash, seizure activity, orthopnea, PND, pedal edema, claudication. Remaining systems are negative.  Physical Exam: Well-developed well-nourished in no acute distress.  Skin is warm and dry.  HEENT is normal.  Neck is supple.  Chest is clear to auscultation with normal expansion.  Cardiovascular exam is regular rate and rhythm.  Abdominal exam nontender or distended. No masses palpated. Extremities show no edema. neuro grossly intact  ECG Sinus rhythm at a rate of 59. Right bundle branch block. Left anterior fascicular block.

## 2016-04-13 ENCOUNTER — Encounter: Payer: Self-pay | Admitting: Cardiology

## 2016-04-13 ENCOUNTER — Ambulatory Visit (INDEPENDENT_AMBULATORY_CARE_PROVIDER_SITE_OTHER): Payer: Medicare Other | Admitting: Cardiology

## 2016-04-13 DIAGNOSIS — I2510 Atherosclerotic heart disease of native coronary artery without angina pectoris: Secondary | ICD-10-CM | POA: Diagnosis not present

## 2016-04-13 NOTE — Assessment & Plan Note (Signed)
Continue aspirin and statin. He is having some burning in his chest with initial walking that improves as he continues. I will arrange a nuclear study for risk stratification.

## 2016-04-13 NOTE — Assessment & Plan Note (Signed)
Continue statin. 

## 2016-04-13 NOTE — Assessment & Plan Note (Signed)
He is not having significant claudication. Continue aspirin and statin.

## 2016-04-13 NOTE — Assessment & Plan Note (Signed)
Blood pressure controlled. 

## 2016-04-13 NOTE — Patient Instructions (Signed)
Your physician has requested that you have en exercise stress myoview. For further information please visit [email or web address]. Please follow instruction sheet, as given.   Your physician wants you to follow-up in: [place] will receive a reminder letter in the mail two months in advance. If you don't receive a letter, please call our office to schedule the follow-up appointment.   If you need a refill on your cardiac medications before your next appointment, please call your pharmacy.

## 2016-04-24 NOTE — Assessment & Plan Note (Signed)
Moist heat and gentle stretching, referred to ortho for consideration

## 2016-04-24 NOTE — Assessment & Plan Note (Signed)
hgba1c acceptable, minimize simple carbs. Increase exercise as tolerated. Continue current meds 

## 2016-04-24 NOTE — Assessment & Plan Note (Signed)
Encouraged activity as tolerated, moist heat and topical treatments, notify us for referral if worsens.

## 2016-04-28 ENCOUNTER — Telehealth: Payer: Self-pay | Admitting: Family Medicine

## 2016-04-28 ENCOUNTER — Telehealth (HOSPITAL_COMMUNITY): Payer: Self-pay

## 2016-04-28 ENCOUNTER — Other Ambulatory Visit: Payer: Self-pay | Admitting: Family Medicine

## 2016-04-28 DIAGNOSIS — M79602 Pain in left arm: Secondary | ICD-10-CM

## 2016-04-28 NOTE — Telephone Encounter (Signed)
Encounter complete. 

## 2016-04-28 NOTE — Telephone Encounter (Signed)
Relation to pt: self  Call back number: home [phone] / cell phone  [phone]   Reason for call:  Spouse states PCP advised patient if left arm joint pain persisted a referral would be placed with sports medicine. Please advise

## 2016-05-03 ENCOUNTER — Encounter (HOSPITAL_COMMUNITY): Payer: Self-pay | Admitting: *Deleted

## 2016-05-03 ENCOUNTER — Ambulatory Visit (HOSPITAL_COMMUNITY)
Admission: RE | Admit: 2016-05-03 | Discharge: 2016-05-03 | Disposition: A | Discharge status: Home / Self Care | Payer: Medicare Other | Source: Ambulatory Visit | Attending: Cardiovascular Disease | Admitting: Cardiovascular Disease

## 2016-05-03 DIAGNOSIS — I10 Essential (primary) hypertension: Secondary | ICD-10-CM | POA: Insufficient documentation

## 2016-05-03 DIAGNOSIS — I2510 Atherosclerotic heart disease of native coronary artery without angina pectoris: Secondary | ICD-10-CM

## 2016-05-03 DIAGNOSIS — R9439 Abnormal result of other cardiovascular function study: Secondary | ICD-10-CM | POA: Insufficient documentation

## 2016-05-03 DIAGNOSIS — I739 Peripheral vascular disease, unspecified: Secondary | ICD-10-CM | POA: Insufficient documentation

## 2016-05-03 DIAGNOSIS — Z8249 Family history of ischemic heart disease and other diseases of the circulatory system: Secondary | ICD-10-CM | POA: Diagnosis not present

## 2016-05-03 DIAGNOSIS — R079 Chest pain, unspecified: Secondary | ICD-10-CM | POA: Insufficient documentation

## 2016-05-03 DIAGNOSIS — I4510 Unspecified right bundle-branch block: Secondary | ICD-10-CM | POA: Insufficient documentation

## 2016-05-03 DIAGNOSIS — Z87891 Personal history of nicotine dependence: Secondary | ICD-10-CM | POA: Diagnosis not present

## 2016-05-03 DIAGNOSIS — E119 Type 2 diabetes mellitus without complications: Secondary | ICD-10-CM | POA: Diagnosis not present

## 2016-05-03 MED ORDER — TECHNETIUM TC 99M SESTAMIBI GENERIC - CARDIOLITE
9.9000 | Freq: Once | INTRAVENOUS | Status: AC | PRN
  Administered 2016-05-03: 9.9 via INTRAVENOUS

## 2016-05-03 MED ORDER — TECHNETIUM TC 99M SESTAMIBI GENERIC - CARDIOLITE
29.0000 | Freq: Once | INTRAVENOUS | Status: AC | PRN
  Administered 2016-05-03: 29 via INTRAVENOUS

## 2016-05-03 NOTE — Progress Notes (Signed)
Dr Oval Linsey reviewed [place] study. Ok d/c pt home. Pt instructed not to do any exertional activity per Dr Oval Linsey. Dr Jacalyn Lefevre nurse, Hilda Blades will call pt to schedule an ROV with Dr Stanford Breed to discuss his test results.Delight Hoh A

## 2016-05-04 LAB — MYOCARDIAL PERFUSION IMAGING
Estimated workload: 8 METS
Exercise duration (min): 8 min
Exercise duration (sec): 1 s
LV dias vol: 99 mL (ref 62–150)
LV sys vol: 42 mL
MPHR: 147 {beats}/min
Peak HR: 144 {beats}/min
Percent HR: 97 %
RPE: 17
Rest HR: 58 {beats}/min
SDS: 5
SRS: 1
SSS: 6
TID: 1.24

## 2016-05-09 ENCOUNTER — Encounter: Payer: Self-pay | Admitting: *Deleted

## 2016-05-09 ENCOUNTER — Ambulatory Visit (INDEPENDENT_AMBULATORY_CARE_PROVIDER_SITE_OTHER): Payer: Medicare Other | Admitting: Cardiology

## 2016-05-09 ENCOUNTER — Other Ambulatory Visit: Payer: Self-pay | Admitting: Cardiology

## 2016-05-09 ENCOUNTER — Encounter: Payer: Self-pay | Admitting: Cardiology

## 2016-05-09 VITALS — BP 138/62 | HR 67 | Ht 72.0 in | Wt 218.0 lb

## 2016-05-09 DIAGNOSIS — I739 Peripheral vascular disease, unspecified: Secondary | ICD-10-CM

## 2016-05-09 DIAGNOSIS — I2510 Atherosclerotic heart disease of native coronary artery without angina pectoris: Secondary | ICD-10-CM

## 2016-05-09 DIAGNOSIS — I2583 Coronary atherosclerosis due to lipid rich plaque: Secondary | ICD-10-CM | POA: Diagnosis not present

## 2016-05-09 DIAGNOSIS — E785 Hyperlipidemia, unspecified: Secondary | ICD-10-CM | POA: Diagnosis not present

## 2016-05-09 DIAGNOSIS — Z01818 Encounter for other preprocedural examination: Secondary | ICD-10-CM

## 2016-05-09 DIAGNOSIS — I208 Other forms of angina pectoris: Secondary | ICD-10-CM

## 2016-05-09 DIAGNOSIS — I10 Essential (primary) hypertension: Secondary | ICD-10-CM | POA: Diagnosis not present

## 2016-05-09 LAB — CBC
HCT: 42.7 % (ref 38.5–50.0)
Hemoglobin: 14.2 g/dL (ref 13.2–17.1)
MCH: 27.4 pg (ref 27.0–33.0)
MCHC: 33.3 g/dL (ref 32.0–36.0)
MCV: 82.4 fL (ref 80.0–100.0)
MPV: 9.4 fL (ref 7.5–12.5)
Platelets: 214 10*3/uL (ref 140–400)
RBC: 5.18 MIL/uL (ref 4.20–5.80)
RDW: 13.7 % (ref 11.0–15.0)
WBC: 6.6 10*3/uL (ref 3.8–10.8)

## 2016-05-09 LAB — PROTIME-INR
INR: 0.99 (ref <1.50)
Prothrombin Time: 13.2 seconds (ref 11.6–15.2)

## 2016-05-09 MED ORDER — ROSUVASTATIN CALCIUM 40 MG PO TABS: 40.0000 mg | ORAL_TABLET | Freq: Every day | ORAL | Status: DC

## 2016-05-09 NOTE — Assessment & Plan Note (Addendum)
Given documented coronary disease discontinue Zocor and treat with Crestor 40 mg a day. He did not tolerate high-dose Lipitor previously. Check lipids and liver in 4 weeks.

## 2016-05-09 NOTE — Assessment & Plan Note (Addendum)
Continue aspirin and statin. Patient continues to describe chest pain with vigorous activities. It is similar to his symptoms prior to previous PCI. Nuclear study reviewed with him and shows mild ischemia. I discussed options of attempting medical therapy versus proceeding with catheterization. He would like to be definitive and I think this is reasonable. Plan cardiac catheterization. The risks and benefits were discussed including myocardial infarction, CVA and death. He agrees to proceed. Hold Glucophage for 48 hours following procedure.

## 2016-05-09 NOTE — Patient Instructions (Signed)
Medication Instructions:   STOP SIMVASTATIN  START ROSUVASTATIN 40 MG ONCE DAILY  Labwork:  Your physician recommends that you HAVE LAB WORK TODAY  Your physician recommends that you return for lab work in: 4 WEEKS= DO NOT EAT PRIOR TO LAB WORK  Testing/Procedures:  Your physician has requested that you have a cardiac catheterization. Cardiac catheterization is used to diagnose and/or treat various heart conditions. Doctors may recommend this procedure for a number of different reasons. The most common reason is to evaluate chest pain. Chest pain can be a symptom of coronary artery disease (CAD), and cardiac catheterization can show whether plaque is narrowing or blocking your heart's arteries. This procedure is also used to evaluate the valves, as well as measure the blood flow and oxygen levels in different parts of your heart. For further information please visit [email or web address]. Please follow instruction sheet, as given.    Follow-Up:  Your physician recommends that you schedule a follow-up appointment in: [place] APP  Your physician recommends that you schedule a follow-up appointment in: [place]

## 2016-05-09 NOTE — Progress Notes (Signed)
HPI: FU coronary artery disease. Carotid Dopplers in August of 2013 showed no significant disease. Patient had PCI in [place] approximately 15 years ago. Records are not available. Nuclear study May 2015 showed an ejection fraction of 61%. There is a fixed defect in the basal and mid lateral wall but no ischemia. Patient did have ST changes. Lower extremity Dopplers February 2016 showed an occluded right anterior tibial artery. Abdominal ultrasound February 2016 showed no aneurysm. Nuclear study performed because of exertional chest pain in May 2017 showed ejection fraction 58%. There appear to be mild ischemia in the inferior lateral and apical lateral wall. Since last seen, He continues to have chest discomfort with initial vigorous activities that resolves with continued activities. It is similar to his symptoms prior to his previous PCI. No orthopnea, PND, pedal edema or syncope.  Current Outpatient Prescriptions  Medication Sig Dispense Refill  . aspirin 81 MG tablet Take 81 mg by mouth daily.    Marland Kitchen glipiZIDE (GLUCOTROL) 5 MG tablet Take 2 tablets (10 mg total) by mouth 2 (two) times daily before a meal. 360 tablet 2  . levothyroxine (SYNTHROID, LEVOTHROID) 100 MCG tablet TAKE 1 TABLET (100 MCG TOTAL) BY MOUTH DAILY BEFORE BREAKFAST. 90 tablet 1  . metFORMIN (GLUCOPHAGE-XR) 500 MG 24 hr tablet TAKE 2 TABLETS BY MOUTH TWICE A DAY 360 tablet 2  . simvastatin (ZOCOR) 40 MG tablet Take 1 tablet (40 mg total) by mouth daily. 90 tablet 2   No current facility-administered medications for this visit.     Past Medical History  Diagnosis Date  . Hyperlipidemia   . Hypertension   . CAD (coronary artery disease)     2 stents  . Diabetes type 2, controlled ([place])   . Chicken pox   . Measles   . Appendicitis   . Mumps as a child  . Melanoma ([place])     Scalp. 2012  . Hypothyroidism   . Thyroid disease     [place] Hyperthyroidism  . Dysphagia, unspecified(787.20) 03/20/2014  . Back pain  02/09/2015  . Aortic calcification ([place]) 02/09/2015  . Hereditary and idiopathic peripheral neuropathy 02/09/2015  . Medicare annual wellness visit, subsequent 06/14/2015  . Diabetes mellitus type 2 in obese ([place]) 04/11/2016    Past Surgical History  Procedure Laterality Date  . Appendectomy    . Coronary angioplasty with stent placement      2 stents  . Cardiac catheterization    . Back surgery      x2 lumbar  . Rotator cuff repair Right   . Wisdom tooth extraction    . Tonsillectomy and adenoidectomy    . Skin cancer excision      melanoma on scalp  . Back surgery      late 1990s had 2 surgeries, first surgery lifted a heavy engine ruptured disds, at L4 and L5, cleaned discs no hardware very helpful. 2 years later while recovering from angiogram with weights applied to left leg caused a recurrence and required surgery again in same area with disc repair  . Knee arthroscopy      right knee  . Lumbar laminectomy/decompression microdiscectomy Bilateral 04/10/2015    Procedure: LUMBAR LAMINECTOMY/DECOMPRESSION MICRODISCECTOMY 2 LEVELS;  Surgeon: Ashok Pall, MD;  Location: [facility];  Service: Neurosurgery;  Laterality: Bilateral;  Bilateral L45 L5S1 Laminectomy and Foraminotomy    Social History   Social History  . Marital Status: Married    Spouse Name: N/A  . Number of  Children: 2  . Years of Education: N/A   Occupational History  . Not on file.   Social History Main Topics  . Smoking status: Former Smoker -- 2.00 packs/day for 30 years    Types: Cigarettes    Start date: 12/27/1983  . Smokeless tobacco: Never Used  . Alcohol Use: Yes     Comment: occasionally  . Drug Use: No  . 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    Other Topics Concern  . Not on file   Social History Narrative    Family History  Problem Relation Age of Onset  . Heart disease Father   . Emphysema Father   . Hypertension Father     . Cancer Father     lung/ healthy  . Heart disease Mother     Deceased  . Cancer Mother     breast  . Hypertension Mother   . Cancer Maternal Grandmother     colon  . Diabetes Brother     type 2  . Heart disease Brother   . Pulmonary embolism Son     vasculiti    ROS: no fevers or chills, productive cough, hemoptysis, dysphasia, odynophagia, melena, hematochezia, dysuria, hematuria, rash, seizure activity, orthopnea, PND, pedal edema, claudication. Remaining systems are negative.  Physical Exam: Well-developed well-nourished in no acute distress.  Skin is warm and dry.  HEENT is normal.  Neck is supple.  Chest is clear to auscultation with normal expansion.  Cardiovascular exam is regular rate and rhythm.  Abdominal exam nontender or distended. No masses palpated. Extremities show no edema. neuro grossly intact

## 2016-05-09 NOTE — Assessment & Plan Note (Signed)
Blood pressure controlled. Continue present medications. 

## 2016-05-09 NOTE — Assessment & Plan Note (Signed)
Continue aspirin and statin. Note patient is not having claudication.

## 2016-05-10 ENCOUNTER — Ambulatory Visit (INDEPENDENT_AMBULATORY_CARE_PROVIDER_SITE_OTHER): Payer: Medicare Other | Admitting: Family Medicine

## 2016-05-10 ENCOUNTER — Encounter: Payer: Self-pay | Admitting: Family Medicine

## 2016-05-10 VITALS — BP 145/79 | HR 59 | Ht 72.0 in | Wt 218.0 lb

## 2016-05-10 DIAGNOSIS — I208 Other forms of angina pectoris: Secondary | ICD-10-CM

## 2016-05-10 DIAGNOSIS — S46212A Strain of muscle, fascia and tendon of other parts of biceps, left arm, initial encounter: Secondary | ICD-10-CM

## 2016-05-10 DIAGNOSIS — S46112A Strain of muscle, fascia and tendon of long head of biceps, left arm, initial encounter: Secondary | ICD-10-CM | POA: Diagnosis present

## 2016-05-10 LAB — BASIC METABOLIC PANEL
BUN: 13 mg/dL (ref 7–25)
CO2: 25 mmol/L (ref 20–31)
Calcium: 10.1 mg/dL (ref 8.6–10.3)
Chloride: 104 mmol/L (ref 98–110)
Creat: 1.2 mg/dL — ABNORMAL HIGH (ref 0.70–1.18)
Glucose, Bld: 101 mg/dL — ABNORMAL HIGH (ref 65–99)
Potassium: 4.8 mmol/L (ref 3.5–5.3)
Sodium: 139 mmol/L (ref 135–146)

## 2016-05-10 NOTE — Patient Instructions (Signed)
You have a grade 1 biceps strain from overuse. I would focus on arm curls and the hammer rotation exercise I showed you - 3 sets of 10 once a day for 4 weeks. Can consider a compression sleeve - I don't think you will need this at this point - I would wait a couple weeks before getting one of these if you were still having problems. Tylenol 500mg  1-2 tabs three times a day as needed for pain. Follow up with me in 4 weeks only if needed.

## 2016-05-11 ENCOUNTER — Encounter (HOSPITAL_COMMUNITY): Payer: Self-pay | Admitting: *Deleted

## 2016-05-11 ENCOUNTER — Encounter (HOSPITAL_COMMUNITY)
Admission: RE | Disposition: A | Discharge status: Home / Self Care | Payer: Self-pay | Source: Ambulatory Visit | Attending: Interventional Cardiology

## 2016-05-11 ENCOUNTER — Ambulatory Visit (HOSPITAL_COMMUNITY)
Admission: RE | Admit: 2016-05-11 | Discharge: 2016-05-11 | Disposition: A | Discharge status: Home / Self Care | Payer: Medicare Other | Source: Ambulatory Visit | Attending: Interventional Cardiology | Admitting: Interventional Cardiology

## 2016-05-11 DIAGNOSIS — Z85820 Personal history of malignant melanoma of skin: Secondary | ICD-10-CM | POA: Diagnosis not present

## 2016-05-11 DIAGNOSIS — E669 Obesity, unspecified: Secondary | ICD-10-CM | POA: Insufficient documentation

## 2016-05-11 DIAGNOSIS — E1142 Type 2 diabetes mellitus with diabetic polyneuropathy: Secondary | ICD-10-CM | POA: Insufficient documentation

## 2016-05-11 DIAGNOSIS — Z87891 Personal history of nicotine dependence: Secondary | ICD-10-CM | POA: Diagnosis not present

## 2016-05-11 DIAGNOSIS — I25119 Atherosclerotic heart disease of native coronary artery with unspecified angina pectoris: Secondary | ICD-10-CM | POA: Diagnosis not present

## 2016-05-11 DIAGNOSIS — I2584 Coronary atherosclerosis due to calcified coronary lesion: Secondary | ICD-10-CM | POA: Diagnosis not present

## 2016-05-11 DIAGNOSIS — I10 Essential (primary) hypertension: Secondary | ICD-10-CM | POA: Diagnosis present

## 2016-05-11 DIAGNOSIS — I2089 Other forms of angina pectoris: Secondary | ICD-10-CM | POA: Insufficient documentation

## 2016-05-11 DIAGNOSIS — Z7984 Long term (current) use of oral hypoglycemic drugs: Secondary | ICD-10-CM | POA: Diagnosis not present

## 2016-05-11 DIAGNOSIS — Z8279 Family history of other congenital malformations, deformations and chromosomal abnormalities: Secondary | ICD-10-CM | POA: Diagnosis not present

## 2016-05-11 DIAGNOSIS — Y712 Prosthetic and other implants, materials and accessory cardiovascular devices associated with adverse incidents: Secondary | ICD-10-CM | POA: Diagnosis not present

## 2016-05-11 DIAGNOSIS — I2510 Atherosclerotic heart disease of native coronary artery without angina pectoris: Secondary | ICD-10-CM | POA: Diagnosis not present

## 2016-05-11 DIAGNOSIS — E785 Hyperlipidemia, unspecified: Secondary | ICD-10-CM | POA: Insufficient documentation

## 2016-05-11 DIAGNOSIS — Z6829 Body mass index (BMI) 29.0-29.9, adult: Secondary | ICD-10-CM | POA: Insufficient documentation

## 2016-05-11 DIAGNOSIS — I208 Other forms of angina pectoris: Secondary | ICD-10-CM

## 2016-05-11 DIAGNOSIS — Z7982 Long term (current) use of aspirin: Secondary | ICD-10-CM | POA: Insufficient documentation

## 2016-05-11 DIAGNOSIS — E118 Type 2 diabetes mellitus with unspecified complications: Secondary | ICD-10-CM

## 2016-05-11 DIAGNOSIS — S46212A Strain of muscle, fascia and tendon of other parts of biceps, left arm, initial encounter: Secondary | ICD-10-CM | POA: Insufficient documentation

## 2016-05-11 DIAGNOSIS — E039 Hypothyroidism, unspecified: Secondary | ICD-10-CM | POA: Insufficient documentation

## 2016-05-11 DIAGNOSIS — Z951 Presence of aortocoronary bypass graft: Secondary | ICD-10-CM | POA: Diagnosis present

## 2016-05-11 DIAGNOSIS — T82855A Stenosis of coronary artery stent, initial encounter: Secondary | ICD-10-CM | POA: Insufficient documentation

## 2016-05-11 HISTORY — PX: CARDIAC CATHETERIZATION: SHX172

## 2016-05-11 LAB — POCT ACTIVATED CLOTTING TIME
Activated Clotting Time: 281 seconds
Activated Clotting Time: 260 seconds

## 2016-05-11 LAB — GLUCOSE, CAPILLARY
Glucose-Capillary: 158 mg/dL — ABNORMAL HIGH (ref 65–99)
Glucose-Capillary: 118 mg/dL — ABNORMAL HIGH (ref 65–99)

## 2016-05-11 SURGERY — LEFT HEART CATH AND CORONARY ANGIOGRAPHY

## 2016-05-11 MED ORDER — HEPARIN (PORCINE) IN NACL 2-0.9 UNIT/ML-% IJ SOLN
INTRAMUSCULAR | Status: AC
  Filled 2016-05-11: qty 1000

## 2016-05-11 MED ORDER — LIDOCAINE HCL (PF) 1 % IJ SOLN
INTRAMUSCULAR | Status: DC | PRN
  Administered 2016-05-11: 2 mL

## 2016-05-11 MED ORDER — SODIUM CHLORIDE 0.9% FLUSH: 3.0000 mL | INTRAVENOUS | Status: DC | PRN

## 2016-05-11 MED ORDER — ASPIRIN 81 MG PO CHEW: 81.0000 mg | CHEWABLE_TABLET | Freq: Every day | ORAL | Status: DC

## 2016-05-11 MED ORDER — HEPARIN SODIUM (PORCINE) 1000 UNIT/ML IJ SOLN
INTRAMUSCULAR | Status: AC
  Filled 2016-05-11: qty 1

## 2016-05-11 MED ORDER — IOPAMIDOL (ISOVUE-370) INJECTION 76%
INTRAVENOUS | Status: AC
  Filled 2016-05-11: qty 100

## 2016-05-11 MED ORDER — HEPARIN SODIUM (PORCINE) 1000 UNIT/ML IJ SOLN
INTRAMUSCULAR | Status: DC | PRN
  Administered 2016-05-11: 1500 [IU] via INTRAVENOUS
  Administered 2016-05-11 (×2): 5000 [IU] via INTRAVENOUS

## 2016-05-11 MED ORDER — SODIUM CHLORIDE 0.9 % IV SOLN: 250.0000 mL | INTRAVENOUS | Status: DC | PRN

## 2016-05-11 MED ORDER — HEPARIN (PORCINE) IN NACL 2-0.9 UNIT/ML-% IJ SOLN
INTRAMUSCULAR | Status: DC | PRN
  Administered 2016-05-11: 10 mL via INTRA_ARTERIAL

## 2016-05-11 MED ORDER — HEPARIN (PORCINE) IN NACL 2-0.9 UNIT/ML-% IJ SOLN
INTRAMUSCULAR | Status: DC | PRN
  Administered 2016-05-11: 1000 mL

## 2016-05-11 MED ORDER — FENTANYL CITRATE (PF) 100 MCG/2ML IJ SOLN
INTRAMUSCULAR | Status: AC
  Filled 2016-05-11: qty 2

## 2016-05-11 MED ORDER — FENTANYL CITRATE (PF) 100 MCG/2ML IJ SOLN
INTRAMUSCULAR | Status: DC | PRN
  Administered 2016-05-11: 50 ug via INTRAVENOUS

## 2016-05-11 MED ORDER — LIDOCAINE HCL (PF) 1 % IJ SOLN
INTRAMUSCULAR | Status: AC
  Filled 2016-05-11: qty 30

## 2016-05-11 MED ORDER — ASPIRIN 81 MG PO CHEW: 81.0000 mg | CHEWABLE_TABLET | ORAL | Status: DC

## 2016-05-11 MED ORDER — VERAPAMIL HCL 2.5 MG/ML IV SOLN
INTRAVENOUS | Status: AC
  Filled 2016-05-11: qty 2

## 2016-05-11 MED ORDER — ADENOSINE (DIAGNOSTIC) 140MCG/KG/MIN
INTRAVENOUS | Status: DC | PRN
  Administered 2016-05-11: 140 ug/kg/min via INTRAVENOUS

## 2016-05-11 MED ORDER — IOPAMIDOL (ISOVUE-370) INJECTION 76%
INTRAVENOUS | Status: DC | PRN
  Administered 2016-05-11: 120 mL via INTRAVENOUS

## 2016-05-11 MED ORDER — ADENOSINE 12 MG/4ML IV SOLN
16.0000 mL | Freq: Once | INTRAVENOUS | Status: DC
  Filled 2016-05-11: qty 16

## 2016-05-11 MED ORDER — MIDAZOLAM HCL 2 MG/2ML IJ SOLN
INTRAMUSCULAR | Status: AC
  Filled 2016-05-11: qty 2

## 2016-05-11 MED ORDER — SODIUM CHLORIDE 0.9% FLUSH: 3.0000 mL | Freq: Two times a day (BID) | INTRAVENOUS | Status: DC

## 2016-05-11 MED ORDER — HEPARIN (PORCINE) IN NACL 2-0.9 UNIT/ML-% IJ SOLN: INTRAMUSCULAR | Status: DC | PRN

## 2016-05-11 MED ORDER — SODIUM CHLORIDE 0.9 % WEIGHT BASED INFUSION: 3.0000 mL/kg/h | INTRAVENOUS | Status: DC

## 2016-05-11 MED ORDER — ONDANSETRON HCL 4 MG/2ML IJ SOLN: 4.0000 mg | Freq: Four times a day (QID) | INTRAMUSCULAR | Status: DC | PRN

## 2016-05-11 MED ORDER — ACETAMINOPHEN 325 MG PO TABS: 650.0000 mg | ORAL_TABLET | ORAL | Status: DC | PRN

## 2016-05-11 MED ORDER — OXYCODONE-ACETAMINOPHEN 5-325 MG PO TABS: 1.0000 | ORAL_TABLET | ORAL | Status: DC | PRN

## 2016-05-11 MED ORDER — SODIUM CHLORIDE 0.9% FLUSH
3.0000 mL | INTRAVENOUS | Status: DC | PRN
Start: 2016-05-11 — End: 2016-05-11

## 2016-05-11 MED ORDER — IOPAMIDOL (ISOVUE-370) INJECTION 76%
INTRAVENOUS | Status: AC
  Filled 2016-05-11: qty 50

## 2016-05-11 MED ORDER — MIDAZOLAM HCL 2 MG/2ML IJ SOLN
INTRAMUSCULAR | Status: DC | PRN
  Administered 2016-05-11 (×2): 1 mg via INTRAVENOUS

## 2016-05-11 MED ORDER — SODIUM CHLORIDE 0.9 % WEIGHT BASED INFUSION: 1.0000 mL/kg/h | INTRAVENOUS | Status: DC

## 2016-05-11 MED ORDER — SODIUM CHLORIDE 0.9 % WEIGHT BASED INFUSION
3.0000 mL/kg/h | INTRAVENOUS | Status: DC
  Administered 2016-05-11: 3 mL/kg/h via INTRAVENOUS

## 2016-05-11 SURGICAL SUPPLY — 14 items
CATH INFINITI 5 FR JL3.5 (CATHETERS) ×2 IMPLANT
CATH INFINITI JR4 5F (CATHETERS) ×2 IMPLANT
CATH MICROCATH NAVVUS (MICROCATHETER) IMPLANT
CATH VISTA GUIDE 6FR JR4 (CATHETERS) ×2 IMPLANT
GLIDESHEATH SLEND A-KIT 6F 22G (SHEATH) ×2 IMPLANT
GUIDEWIRE PRESSURE COMET II (WIRE) ×2 IMPLANT
KIT ENCORE 26 ADVANTAGE (KITS) ×2 IMPLANT
KIT HEART LEFT (KITS) ×2 IMPLANT
MICROCATHETER NAVVUS (MICROCATHETER)
PACK CARDIAC CATHETERIZATION (CUSTOM PROCEDURE TRAY) ×2 IMPLANT
TRANSDUCER W/STOPCOCK (MISCELLANEOUS) ×2 IMPLANT
TUBING CIL FLEX 10 FLL-RA (TUBING) ×2 IMPLANT
WIRE ASAHI PROWATER 180CM (WIRE) ×2 IMPLANT
WIRE SAFE-T 1.5MM-J .035X260CM (WIRE) ×2 IMPLANT

## 2016-05-11 NOTE — Interval H&P Note (Signed)
Cath Lab Visit (complete for each Cath Lab visit)  Clinical Evaluation Leading to the Procedure:   ACS: No.  Non-ACS:    Anginal Classification: CCS Wade  Anti-ischemic medical therapy: Maximal Therapy (2 or more classes of medications)  Non-Invasive Test Results: Intermediate-risk stress test findings: cardiac mortality 1-3%/year  Prior CABG: No previous CABG      History and Physical Interval Note:  05/11/2016 11:08 AM  Jack Wade  has presented today for surgery, with the diagnosis of positive nuc - cp  The various methods of treatment have been discussed with the patient and family. After consideration of risks, benefits and other options for treatment, the patient has consented to  Procedure(s): Left Heart Cath and Coronary Angiography (N/A) as a surgical intervention .  The patient's history has been reviewed, patient examined, no change in status, stable for surgery.  I have reviewed the patient's chart and labs.  Questions were answered to the patient's satisfaction.     Jack Wade

## 2016-05-11 NOTE — Progress Notes (Signed)
Attempted to remove 3cc of air from TRB. RT wrist began to bleed. 6cc of air replaced in trb before hemostasis was achieved

## 2016-05-11 NOTE — H&P (View-Only) (Signed)
HPI: FU coronary artery disease. Carotid Dopplers in August of 2013 showed no significant disease. Patient had PCI in [place] approximately 15 years ago. Records are not available. Nuclear study May 2015 showed an ejection fraction of 61%. There is a fixed defect in the basal and mid lateral wall but no ischemia. Patient did have ST changes. Lower extremity Dopplers February 2016 showed an occluded right anterior tibial artery. Abdominal ultrasound February 2016 showed no aneurysm. Nuclear study performed because of exertional chest pain in May 2017 showed ejection fraction 58%. There appear to be mild ischemia in the inferior lateral and apical lateral wall. Since last seen, He continues to have chest discomfort with initial vigorous activities that resolves with continued activities. It is similar to his symptoms prior to his previous PCI. No orthopnea, PND, pedal edema or syncope.  Current Outpatient Prescriptions  Medication Sig Dispense Refill  . aspirin 81 MG tablet Take 81 mg by mouth daily.    Marland Kitchen glipiZIDE (GLUCOTROL) 5 MG tablet Take 2 tablets (10 mg total) by mouth 2 (two) times daily before a meal. 360 tablet 2  . levothyroxine (SYNTHROID, LEVOTHROID) 100 MCG tablet TAKE 1 TABLET (100 MCG TOTAL) BY MOUTH DAILY BEFORE BREAKFAST. 90 tablet 1  . metFORMIN (GLUCOPHAGE-XR) 500 MG 24 hr tablet TAKE 2 TABLETS BY MOUTH TWICE A DAY 360 tablet 2  . simvastatin (ZOCOR) 40 MG tablet Take 1 tablet (40 mg total) by mouth daily. 90 tablet 2   No current facility-administered medications for this visit.     Past Medical History  Diagnosis Date  . Hyperlipidemia   . Hypertension   . CAD (coronary artery disease)     2 stents  . Diabetes type 2, controlled ([place])   . Chicken pox   . Measles   . Appendicitis   . Mumps as a child  . Melanoma ([place])     Scalp. 2012  . Hypothyroidism   . Thyroid disease     [place] Hyperthyroidism  . Dysphagia, unspecified(787.20) 03/20/2014  . Back pain  02/09/2015  . Aortic calcification ([place]) 02/09/2015  . Hereditary and idiopathic peripheral neuropathy 02/09/2015  . Medicare annual wellness visit, subsequent 06/14/2015  . Diabetes mellitus type 2 in obese ([place]) 04/11/2016    Past Surgical History  Procedure Laterality Date  . Appendectomy    . Coronary angioplasty with stent placement      2 stents  . Cardiac catheterization    . Back surgery      x2 lumbar  . Rotator cuff repair Right   . Wisdom tooth extraction    . Tonsillectomy and adenoidectomy    . Skin cancer excision      melanoma on scalp  . Back surgery      late 1990s had 2 surgeries, first surgery lifted a heavy engine ruptured disds, at L4 and L5, cleaned discs no hardware very helpful. 2 years later while recovering from angiogram with weights applied to left leg caused a recurrence and required surgery again in same area with disc repair  . Knee arthroscopy      right knee  . Lumbar laminectomy/decompression microdiscectomy Bilateral 04/10/2015    Procedure: LUMBAR LAMINECTOMY/DECOMPRESSION MICRODISCECTOMY 2 LEVELS;  Surgeon: Ashok Pall, MD;  Location: [facility];  Service: Neurosurgery;  Laterality: Bilateral;  Bilateral L45 L5S1 Laminectomy and Foraminotomy    Social History   Social History  . Marital Status: Married    Spouse Name: N/A  . Number of  Children: 2  . Years of Education: N/A   Occupational History  . Not on file.   Social History Main Topics  . Smoking status: Former Smoker -- 2.00 packs/day for 30 years    Types: Cigarettes    Start date: 12/27/1983  . Smokeless tobacco: Never Used  . Alcohol Use: Yes     Comment: occasionally  . Drug Use: No  . 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    Other Topics Concern  . Not on file   Social History Narrative    Family History  Problem Relation Age of Onset  . Heart disease Father   . Emphysema Father   . Hypertension Father     . Cancer Father     lung/ healthy  . Heart disease Mother     Deceased  . Cancer Mother     breast  . Hypertension Mother   . Cancer Maternal Grandmother     colon  . Diabetes Brother     type 2  . Heart disease Brother   . Pulmonary embolism Son     vasculiti    ROS: no fevers or chills, productive cough, hemoptysis, dysphasia, odynophagia, melena, hematochezia, dysuria, hematuria, rash, seizure activity, orthopnea, PND, pedal edema, claudication. Remaining systems are negative.  Physical Exam: Well-developed well-nourished in no acute distress.  Skin is warm and dry.  HEENT is normal.  Neck is supple.  Chest is clear to auscultation with normal expansion.  Cardiovascular exam is regular rate and rhythm.  Abdominal exam nontender or distended. No masses palpated. Extremities show no edema. neuro grossly intact

## 2016-05-11 NOTE — Discharge Instructions (Signed)
° °  APPOINTMENT WITH DR. Servando Snare THUR. 05/12/16 @ 10:30 [place].  STE 411 (4TH FLOOR)    Radial Site Care Refer to this sheet in the next few weeks. These instructions provide you with information about caring for yourself after your procedure. Your health care provider may also give you more specific instructions. Your treatment has been planned according to current medical practices, but problems sometimes occur. Call your health care provider if you have any problems or questions after your procedure. WHAT TO EXPECT AFTER THE PROCEDURE After your procedure, it is typical to have the following:  Bruising at the radial site that usually fades within 1-2 weeks.  Blood collecting in the tissue (hematoma) that may be painful to the touch. It should usually decrease in size and tenderness within 1-2 weeks. HOME CARE INSTRUCTIONS  Take medicines only as directed by your health care provider.  You may shower 24-48 hours after the procedure or as directed by your health care provider. Remove the bandage (dressing) and gently wash the site with plain soap and water. Pat the area dry with a clean towel. Do not rub the site, because this may cause bleeding.  Do not take baths, swim, or use a hot tub until your health care provider approves.  Check your insertion site every day for redness, swelling, or drainage.  Do not apply powder or lotion to the site.  Do not flex or bend the affected arm for 24 hours or as directed by your health care provider.  Do not push or pull heavy objects with the affected arm for 24 hours or as directed by your health care provider.  Do not lift over 10 lb (4.5 kg) for 5 days after your procedure or as directed by your health care provider.  Ask your health care provider when it is okay to:  Return to work or school.  Resume usual physical activities or sports.  Resume sexual activity.  Do not drive home if you are discharged the same day as  the procedure. Have someone else drive you.  You may drive 24 hours after the procedure unless otherwise instructed by your health care provider.  Do not operate machinery or power tools for 24 hours after the procedure.  If your procedure was done as an outpatient procedure, which means that you went home the same day as your procedure, a responsible adult should be with you for the first 24 hours after you arrive home.  Keep all follow-up visits as directed by your health care provider. This is important. SEEK MEDICAL CARE IF:  You have a fever.  You have chills.  You have increased bleeding from the radial site. Hold pressure on the site. SEEK IMMEDIATE MEDICAL CARE IF:  You have unusual pain at the radial site.  You have redness, warmth, or swelling at the radial site.  You have drainage (other than a small amount of blood on the dressing) from the radial site.  The radial site is bleeding, and the bleeding does not stop after 30 minutes of holding steady pressure on the site.  Your arm or hand becomes pale, cool, tingly, or numb.   This information is not intended to replace advice given to you by your health care provider. Make sure you discuss any questions you have with your health care provider.   Document Released: 01/14/2011 Document Revised: 01/02/2015 Document Reviewed: 06/30/2014 Elsevier Interactive Patient Education [employer].

## 2016-05-11 NOTE — Progress Notes (Signed)
PCP and consultation requested by: Penni Homans, MD  Subjective:   HPI: Patient is a 75 y.o. male here for left arm pain.  Patient reports he's had about 1 month of anterior upper arm pain. No acute injury or trauma. Pain level up to 4/10 at most, dull. Worse with lifting items. Feels like something is pulling in this area. No radiation. No skin changes, numbness. Right handed. Has been improving since Sunday however.  Past Medical History  Diagnosis Date  . Hyperlipidemia   . Hypertension   . CAD (coronary artery disease)     2 stents  . Diabetes type 2, controlled ([place])   . Chicken pox   . Measles   . Appendicitis   . Mumps as a child  . Melanoma ([place])     Scalp. 2012  . Hypothyroidism   . Thyroid disease     [place] Hyperthyroidism  . Dysphagia, unspecified(787.20) 03/20/2014  . Back pain 02/09/2015  . Aortic calcification ([place]) 02/09/2015  . Hereditary and idiopathic peripheral neuropathy 02/09/2015  . Medicare annual wellness visit, subsequent 06/14/2015  . Diabetes mellitus type 2 in obese ([place]) 04/11/2016    Current Facility-Administered Medications on File Prior to Visit  Medication Dose Route Frequency Provider Last Rate Last Dose  . 0.9 %  sodium chloride infusion  250 mL Intravenous PRN Lelon Perla, MD      . Derrill Memo ON 05/12/2016] 0.9% sodium chloride infusion  3 mL/kg/hr Intravenous Continuous Lelon Perla, MD 296.7 mL/hr at 05/11/16 0942 3 mL/kg/hr at 05/11/16 0942   Followed by  . [START ON 05/12/2016] 0.9% sodium chloride infusion  1 mL/kg/hr Intravenous Continuous Lelon Perla, MD      . aspirin chewable tablet 81 mg  81 mg Oral Pre-Cath Lelon Perla, MD      . sodium chloride flush (NS) 0.9 % injection 3 mL  3 mL Intravenous Q12H Lelon Perla, MD      . sodium chloride flush (NS) 0.9 % injection 3 mL  3 mL Intravenous PRN Lelon Perla, MD       Current Outpatient Prescriptions on File Prior to Visit  Medication Sig Dispense Refill   . aspirin 81 MG tablet Take 81 mg by mouth daily.    Marland Kitchen glipiZIDE (GLUCOTROL) 5 MG tablet Take 2 tablets (10 mg total) by mouth 2 (two) times daily before a meal. 360 tablet 2  . levothyroxine (SYNTHROID, LEVOTHROID) 100 MCG tablet TAKE 1 TABLET (100 MCG TOTAL) BY MOUTH DAILY BEFORE BREAKFAST. 90 tablet 1  . metFORMIN (GLUCOPHAGE-XR) 500 MG 24 hr tablet TAKE 2 TABLETS BY MOUTH TWICE A DAY 360 tablet 2  . rosuvastatin (CRESTOR) 40 MG tablet Take 1 tablet (40 mg total) by mouth daily. 90 tablet 3    Past Surgical History  Procedure Laterality Date  . Appendectomy    . Coronary angioplasty with stent placement      2 stents  . Cardiac catheterization    . Back surgery      x2 lumbar  . Rotator cuff repair Right   . Wisdom tooth extraction    . Tonsillectomy and adenoidectomy    . Skin cancer excision      melanoma on scalp  . Back surgery      late 1990s had 2 surgeries, first surgery lifted a heavy engine ruptured disds, at L4 and L5, cleaned discs no hardware very helpful. 2 years later while recovering from angiogram with weights applied to left  leg caused a recurrence and required surgery again in same area with disc repair  . Knee arthroscopy      right knee  . Lumbar laminectomy/decompression microdiscectomy Bilateral 04/10/2015    Procedure: LUMBAR LAMINECTOMY/DECOMPRESSION MICRODISCECTOMY 2 LEVELS;  Surgeon: Ashok Pall, MD;  Location: [facility];  Service: Neurosurgery;  Laterality: Bilateral;  Bilateral L45 L5S1 Laminectomy and Foraminotomy    No Known Allergies  Social History   Social History  . Marital Status: Married    Spouse Name: N/A  . Number of Children: 2  . Years of Education: N/A   Occupational History  . Not on file.   Social History Main Topics  . Smoking status: Former Smoker -- 2.00 packs/day for 30 years    Types: Cigarettes    Start date: 12/27/1983  . Smokeless tobacco: Never Used  . Alcohol Use: 0.0 oz/week    0 Standard drinks or  equivalent per week     Comment: occasionally  . Drug Use: No  . 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    Other Topics Concern  . Not on file   Social History Narrative    Family History  Problem Relation Age of Onset  . Heart disease Father   . Emphysema Father   . Hypertension Father   . Cancer Father     lung/ healthy  . Heart disease Mother     Deceased  . Cancer Mother     breast  . Hypertension Mother   . Cancer Maternal Grandmother     colon  . Diabetes Brother     type 2  . Heart disease Brother   . Pulmonary embolism Son     vasculiti    BP 145/79 mmHg  Pulse 59  Ht 6' (1.829 m)  Wt 218 lb (98.884 kg)  BMI 29.56 kg/m2  Review of Systems: See HPI above.    Objective:  Physical Exam:  Gen: NAD, comfortable in exam room  Left arm/shoulder: No swelling, ecchymoses.  No gross deformity. Mild TTP medial bicep.  No other tenderness. FROM without pain. Negative Hawkins, Neers. Negative Speeds, Yergasons. Strength 5/5 with empty can and resisted internal/external rotation, elbow flexion/extension, wrist flexion/extension. Negative apprehension. NV intact distally.  Right arm: FROM elbow and shoulder without pain.    Assessment & Plan:  1. Left biceps strain - mild, grade 1 from overuse.  Shown home exercises to do daily.  Consider compression, heat, tylenol as needed as well.  F/u in 4 weeks if not improving.

## 2016-05-11 NOTE — Assessment & Plan Note (Signed)
mild, grade 1 from overuse.  Shown home exercises to do daily.  Consider compression, heat, tylenol as needed as well.  F/u in 4 weeks if not improving.

## 2016-05-12 ENCOUNTER — Other Ambulatory Visit: Payer: Self-pay | Admitting: *Deleted

## 2016-05-12 ENCOUNTER — Institutional Professional Consult (permissible substitution) (INDEPENDENT_AMBULATORY_CARE_PROVIDER_SITE_OTHER): Payer: Medicare Other | Admitting: Cardiothoracic Surgery

## 2016-05-12 ENCOUNTER — Encounter (HOSPITAL_COMMUNITY): Payer: Self-pay | Admitting: Interventional Cardiology

## 2016-05-12 VITALS — BP 139/76 | HR 61 | Resp 20 | Ht 72.0 in | Wt 218.0 lb

## 2016-05-12 DIAGNOSIS — I2510 Atherosclerotic heart disease of native coronary artery without angina pectoris: Secondary | ICD-10-CM | POA: Diagnosis not present

## 2016-05-12 DIAGNOSIS — I208 Other forms of angina pectoris: Secondary | ICD-10-CM | POA: Diagnosis not present

## 2016-05-12 NOTE — Progress Notes (Signed)
[address]       [place] [address]             [phone]                    Jawan C Krenz [place] Medical Record [id number] Date of Birth: 07/30/1941  Referring: Belva Crome, MD Primary Care: Penni Homans, MD  Chief Complaint:    Chief Complaint  Patient presents with  . Coronary Artery Disease    Surgical eval, Cardiac Cath 05/11/16    History of Present Illness:    Jack Wade 75 y.o. male is seen in the office  today for Consideration of coronary artery bypass grafting. The patient has a known history of coronary occlusive disease having had 2 stents placed in the proximal right coronary artery in June 1998. He had cardiac catheterization done yesterday because of increasing symptoms of substernal chest pain while walking with neighbors in the neighborhood and also while cutting his grass. He notes his symptoms have been increasing recently. Because of these symptoms he had a nuclear stress test with mild inferior lateral and apical lateral wall mild ischemia ejection fraction 58%. He notes the symptoms are very similar to the discomfort that he had prior to his stent placement in 1998. The patient is a known diabetic on metformin and Glucotrol. He has had no known myocardial infarction.        Current Activity/ Functional Status:  Patient is independent with mobility/ambulation, transfers, ADL's, IADL's.   Zubrod Score: At the time of surgery this patient's most appropriate activity status/level should be described as: []     0    Normal activity, no symptoms [x]     1    Restricted in physical strenuous activity but ambulatory, able to do out light work []     2    Ambulatory and capable of self care, unable to do work activities, up and about               >50 % of waking hours                              []     3    Only limited self care, in bed greater than 50% of waking hours []     4    Completely disabled, no self care,  confined to bed or chair []     5    Moribund   Past Medical History  Diagnosis Date  . Hyperlipidemia   . Hypertension   . CAD (coronary artery disease)     2 stents  . Diabetes type 2, controlled ([place])   . Chicken pox   . Measles   . Appendicitis   . Mumps as a child  . Melanoma ([place])     Scalp. 2012  . Hypothyroidism   . Thyroid disease     [place] Hyperthyroidism  . Dysphagia, unspecified(787.20) 03/20/2014  . Back pain 02/09/2015  . Aortic calcification ([place]) 02/09/2015  . Hereditary and idiopathic peripheral neuropathy 02/09/2015  . Medicare annual wellness visit, subsequent 06/14/2015  . Diabetes mellitus type 2 in obese ([place]) 04/11/2016    Past Surgical History  Procedure Laterality Date  . Appendectomy    . Coronary angioplasty with stent placement      2 stents  . Cardiac catheterization    . Back surgery  x2 lumbar  . Rotator cuff repair Right   . Wisdom tooth extraction    . Tonsillectomy and adenoidectomy    . Skin cancer excision      melanoma on scalp  . Back surgery      late 1990s had 2 surgeries, first surgery lifted a heavy engine ruptured disds, at L4 and L5, cleaned discs no hardware very helpful. 2 years later while recovering from angiogram with weights applied to left leg caused a recurrence and required surgery again in same area with disc repair  . Knee arthroscopy      right knee  . Lumbar laminectomy/decompression microdiscectomy Bilateral 04/10/2015    Procedure: LUMBAR LAMINECTOMY/DECOMPRESSION MICRODISCECTOMY 2 LEVELS;  Surgeon: Ashok Pall, MD;  Location: [facility];  Service: Neurosurgery;  Laterality: Bilateral;  Bilateral L45 L5S1 Laminectomy and Foraminotomy  . Cardiac catheterization N/A 05/11/2016    Procedure: Left Heart Cath and Coronary Angiography;  Surgeon: Belva Crome, MD;  Location: [facility];  Service: Cardiovascular;  Laterality: N/A;  . Cardiac catheterization N/A 05/11/2016    Procedure: Intravascular Pressure  Wire/FFR Study;  Surgeon: Belva Crome, MD;  Location: [facility];  Service: Cardiovascular;  Laterality: N/A;    Family History  Problem Relation Age of Onset  . Heart disease Father   . Emphysema Father   . Hypertension Father   . Cancer Father     lung/ healthy  . Heart disease Mother     Deceased  . Cancer Mother     breast  . Hypertension Mother   . Cancer Maternal Grandmother     colon  . Diabetes Brother     type 2  . Heart disease Brother   . Pulmonary embolism Son     vasculiti    Social History   Social History  . Marital Status: Married    Spouse Name: N/A  . Number of Children: 2  . Years of Education: N/A   Occupational History  . Retired [employer]    Social History Main Topics  . Smoking status: Former Smoker -- 2.00 packs/day for 30 years    Types: Cigarettes    Start date: 12/27/1983  . Smokeless tobacco: Never Used  . Alcohol Use: 0.0 oz/week    0 Standard drinks or equivalent per week     Comment: occasionally  . Drug Use: No  . 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    Other Topics Concern  . Not on file   Social History Narrative    History  Smoking status  . Former Smoker -- 2.00 packs/day for 30 years  . Types: Cigarettes  . Start date: 12/27/1983  Smokeless tobacco  . Never Used    History  Alcohol Use  . 0.0 oz/week  . 0 Standard drinks or equivalent per week    Comment: occasionally     No Known Allergies  Current Outpatient Prescriptions  Medication Sig Dispense Refill  . aspirin 81 MG tablet Take 81 mg by mouth daily.    Marland Kitchen glipiZIDE (GLUCOTROL) 5 MG tablet Take 2 tablets (10 mg total) by mouth 2 (two) times daily before a meal. 360 tablet 2  . levothyroxine (SYNTHROID, LEVOTHROID) 100 MCG tablet TAKE 1 TABLET (100 MCG TOTAL) BY MOUTH DAILY BEFORE BREAKFAST. 90 tablet 1  . metFORMIN (GLUCOPHAGE-XR) 500 MG 24 hr tablet TAKE 2 TABLETS BY  MOUTH TWICE A DAY 360  tablet 2  . rosuvastatin (CRESTOR) 40 MG tablet Take 1 tablet (40 mg total) by mouth daily. 90 tablet 3   No current facility-administered medications for this visit.      Review of Systems:     Cardiac Review of Systems: Y or N  Chest Pain [ y   ]  Resting SOB [ n  ] Exertional SOB  [ n ]  Orthopnea [  n]   Pedal Edema [n   ]    Palpitations [ n ] Syncope  [n  ]   Presyncope [ n  ]  General Review of Systems: [Y] = yes [  ]=no Constitional: recent weight change [n  ];  Wt loss over the last 3 months [   ] anorexia [  ]; fatigue [  ]; nausea [  ]; night sweats [  ]; fever [  ]; or chills [  ];          Dental: poor dentition[  ]; Last Dentist visit:   Eye : blurred vision [  ]; diplopia [   ]; vision changes [  ];  Amaurosis fugax[  ]; Resp: cough [  ];  wheezing[  ];  hemoptysis[  ]; shortness of breath[  ]; paroxysmal nocturnal dyspnea[  ]; dyspnea on exertion[  ]; or orthopnea[  ];  GI:  gallstones[  ], vomiting[  ];  dysphagia[  ]; melena[n  ];  hematochezia [  ]; heartburn[  ];   Hx of  Colonoscopy[y  ]; GU: kidney stones [  ]; hematuria[  ];   dysuria [  ];  nocturia[  ];  history of     obstruction [  ]; urinary frequency [  ]             Skin: rash, swelling[  ];, hair loss[  ];  peripheral edema[  ];  or itching[  ]; Musculosketetal: myalgias[  ];  joint swelling[  ];  joint erythema[  ];  joint pain[y  ];  back pain[  ];  Heme/Lymph: bruising[  ];  bleeding[  ];  anemia[  ];  Neuro: TIA[ n ];  headaches[  ];  stroke[ n ];  vertigo[  ];  seizures[  ];   paresthesias[  ];  difficulty walking[ y ];  Psych:depression[  ]; anxiety[  ];  Endocrine: diabetes[y  ];  thyroid dysfunction[n  ];  Immunizations: Flu up to date Florencio.Farrier  ]; Pneumococcal up to date Florencio.Farrier  ];  Other:  Physical Exam: BP 139/76 mmHg  Pulse 61  Resp 20  Ht 6' (1.829 m)  Wt 218 lb (98.884 kg)  BMI 29.56 kg/m2  SpO2 97%  PHYSICAL EXAMINATION: General appearance: alert, cooperative, appears  stated age and no distress Head: Normocephalic, without obvious abnormality, atraumatic Neck: no adenopathy, no carotid bruit, no JVD, supple, symmetrical, trachea midline and thyroid not enlarged, symmetric, no tenderness/mass/nodules Lymph nodes: Cervical, supraclavicular, and axillary nodes normal. Resp: clear to auscultation bilaterally Back: symmetric, no curvature. ROM normal. No CVA tenderness. Cardio: regular rate and rhythm, S1, S2 normal, no murmur, click, rub or gallop GI: soft, non-tender; bowel sounds normal; no masses,  no organomegaly and No palpable abdominal aneurysm Extremities: extremities normal, atraumatic, no cyanosis or edema and Homans sign is negative, no sign of DVT Neurologic: Grossly normal Patient has adequate vein for bypass noted at both ankles Has palpable DP and PT pulses bilaterally  Diagnostic Studies & Laboratory data:  Recent Radiology Findings:   No results found.     Recent Lab Findings: Lab Results  Component Value Date   WBC 6.6 05/09/2016   HGB 14.2 05/09/2016   HCT 42.7 05/09/2016   PLT 214 05/09/2016   GLUCOSE 101* 05/09/2016   CHOL 122 04/11/2016   TRIG 308.0* 04/11/2016   HDL 39.30 04/11/2016   LDLDIRECT 43.0 04/11/2016   LDLCALC 45 12/11/2015   ALT 17 04/11/2016   AST 20 04/11/2016   NA 139 05/09/2016   K 4.8 05/09/2016   CL 104 05/09/2016   CREATININE 1.20* 05/09/2016   BUN 13 05/09/2016   CO2 25 05/09/2016   TSH 3.31 04/11/2016   INR 0.99 05/09/2016   HGBA1C 7.6* 04/11/2016   CATH:  Intravascular Pressure Wire/FFR Study    Left Heart Cath and Coronary Angiography    Conclusion    1. Prox RCA to Mid RCA lesion, 60% stenosed. The lesion was previously treated with a stent (unknown type). 2. Ramus lesion, 99% stenosed. 3. LM lesion, 50% stenosed. 4. Mid LAD lesion, 85% stenosed. 5. Ost 2nd Diag lesion, 70% stenosed. 6. Prox Cx to Dist Cx lesion, 80% stenosed. 7. 2nd Mrg lesion, 80% stenosed. 8. RPDA lesion,  75% stenosed. 9. Ost LM to LM lesion, 40% stenosed.  After reviewing digital images diffuse in-stent restenosis in the RCA was of intermediate severity. FFR was performed to determine whether there was significant obstruction within the stented segment. We initially attempted to use Assist device but ultimately because of technical difficulty, we used the Comet system. FFR across the proximal to mid RCA stent was 0.86, suggesting hemodynamically insignificant.   Severe, calcific, three-vessel coronary disease with moderate distal left main, severe proximal to mid LAD disease, severe mid ramus intermedius, severe segmental circumflex, and severe obstruction in the PDA with hemodynamically insignificant obstruction in the previously stented proximal to mid RCA.  Normal left ventricular function with normal hemodynamics.    I have independently reviewed the above  cath films and reviewed the findings with the  patient .    Assessment / Plan:   Significant 3 vessel coronary artery disease and diabetic male, I reviewed the films with the patient is wife and recommended proceeding with coronary artery bypass grafting wrist and options were discussed with him in detail. He is agreeable with proceeding with coronary artery bypass grafting with tentatively scheduled this for Tuesday, May 23. The patient has been cautioned to limit his physical exertion until the time of surgery, and should he have recurrent symptoms or prolonged chest pain to call 911 and be evaluated immediately.  We'll obtain preoperative Dopplers, and preoperative echocardiogram  The goals risks and alternatives of the planned surgical procedure CABG have been discussed with the patient in detail. The risks of the procedure including death, infection, stroke, myocardial infarction, bleeding, blood transfusion have all been discussed specifically.  I have quoted [name] a 2 % of perioperative mortality and a complication rate  as high as 40 %. The patient's questions have been answered.[name] is willing  to proceed with the planned procedure.    In addition to other potential risks and complications from the surgery, I have made the patient aware of the recent [place] concerning the risk of infection by Myocobacterium chimaera related to the use of Stockert 3T heater-cooler equipment during cardiac surgery. I discussed with the patient the low risk of infection, as well as our compliance with the most current FDA recommendations  to minimize infection and testing of all devices for contamination. The patient has been made aware of the limited alternatives to immediately replacing the current equipment. The patient has been informed regarding the risks associated with waiting to proceed with needed surgery and that such risks are greater than the risk of infection related to the use of the heater-cooler device. I did make the patient aware that after careful review of the patients having cardiac surgery at [facility] we have no evidence that heater/cooler related infections have occurred at [facility]. We discussed that this is a slow-growing bacterium, such that it can take some period of time for symptoms to develop.   I  spent 40 minutes counseling the patient face to face and 50% or more the  time was spent in counseling and coordination of care. The total time spent in the appointment was 60 minutes.  [name] MD      [address] [place] [address] Office [phone]   Beeper [phone]  05/12/2016 11:48 AM

## 2016-05-12 NOTE — Patient Instructions (Signed)
Coronary Artery Bypass Grafting Coronary artery bypass grafting (CABG) is a procedure done to bypass or fix arteries of the heart (coronary arteries) that have become narrow or blocked. This narrowing is usually the result of plaque that has built up in the walls of the vessels. The coronary arteries supply the heart with the oxygen and nutrients it needs to pump blood to your body. In the CABG procedure, a section of blood vessel from another part of the body (usually the leg, arm, or chest wall) is removed and then inserted where it will allow blood to bypass the damaged part of the coronary artery.  LET [facility] CARE PROVIDER KNOW ABOUT:  Any allergies you have.   All medicines you are taking, including blood thinners, vitamins, herbs, eye drops, creams, and over-the-counter medicines.   Use of steroids (by mouth or creams).   Previous problems you or members of your family have had with the use of anesthetics.   Any blood disorders you have.   Previous surgeries you have had.   Medical conditions you have.  RISKS AND COMPLICATIONS Generally, this is a safe procedure. However, problems can occur and include:   Blood loss.   Stroke.   Infection.   Pain at the surgical site.   Heart attack during or after surgery.   Kidney failure.  BEFORE THE PROCEDURE  Take medicines only as directed by your health care provider. You may be asked to start new medicines and stop taking others. Do not stop medicines or adjust dosages on your own.  Do not eat or drink anything after midnight the night before the procedure or as directed by your health care provider. Ask your health care provider if it is okay to take a sip of water with any needed medicines. PROCEDURE The surgeon may use either an open technique or a minimally invasive technique for this surgery. Traditional open surgery  You will be given medicine to make you sleep through the procedure (general  anesthetic).  Once you are asleep, a cut (incision) will be made down the front of the chest through the breastbone (sternum). The sternum will be spread open so your heart can be seen.  You will then be placed on a heart-lung bypass machine. This machine will provide oxygen to your blood while the heart is undergoing surgery.  Your heart will then be temporarily stopped so that the surgeon can do the next steps.  A section of vein will likely be taken from your leg and used to bypass the blocked arteries of your heart. Sometimes parts of an artery from inside your chest wall or from your arm will be used, either alone or in combination with leg veins.  When the bypasses are done, you will be taken off the machine.  Your heart will be restarted and will take over again normally.  The sac around the heart will be closed.  Your chest will then be closed with stitches or staples.  Tubes will remain in your chest and will be connected to a suction device in order to help drain fluid and reinflate the lungs. Minimally invasive surgery This technique is done from an incision over your left chest area. If appropriate, your surgeon may not have to slow or stop your heart. If your condition allows for this procedure, there will often be less blood loss, less pain, a shorter hospital stay, and faster recovery compared to traditional open surgery. AFTER THE PROCEDURE  You will be taken to  a recovery area to be monitored.  You may wake up with a tube in your throat to help your breathing. You may be connected to a breathing machine. You will not be able to talk while the tube is in place. The tube will be taken out as soon as it is safe to do so.  You will be groggy and may have some pain. You will be given pain medicine to help control the pain.   This information is not intended to replace advice given to you by your health care provider. Make sure you discuss any questions you have with your  health care provider.   Document Released: 09/21/2005 Document Revised: 01/02/2015 Document Reviewed: 05/21/2013 Elsevier Interactive Patient Education 2016 Elsevier Inc.  Coronary Artery Bypass Grafting, Care After Refer to this sheet in the next few weeks. These instructions provide you with information on caring for yourself after your procedure. Your health care provider may also give you more specific instructions. Your treatment has been planned according to current medical practices, but problems sometimes occur. Call your health care provider if you have any problems or questions after your procedure. WHAT TO EXPECT AFTER THE PROCEDURE Recovery from surgery will be different for everyone. Some people feel well after 3 or 4 weeks, while for others it takes longer. After your procedure, it is typical to have the following:  Nausea and a lack of appetite.   Constipation.  Weakness and fatigue.   Depression or irritability.   Pain or discomfort at your incision site. HOME CARE INSTRUCTIONS  Take medicines only as directed by your health care provider. Do not stop taking medicines or start any new medicines without first checking with your health care provider.  Take your pulse as directed by your health care provider.  Perform deep breathing as directed by your health care provider. If you were given a device called an incentive spirometer, use it to practice deep breathing several times a day. Support your chest with a pillow or your arms when you take deep breaths or cough.  Keep incision areas clean, dry, and protected. Remove or change any bandages (dressings) only as directed by your health care provider. You may have skin adhesive strips over the incision areas. Do not take the strips off. They will fall off on their own.  Check incision areas daily for any swelling, redness, or drainage.  If incisions were made in your legs, do the following:  Avoid crossing your legs.    Avoid sitting for long periods of time. Change positions every 30 minutes.   Elevate your legs when you are sitting.  Wear compression stockings as directed by your health care provider. These stockings help keep blood clots from forming in your legs.  Take showers once your health care provider approves. Until then, only take sponge baths. Pat incisions dry. Do not rub incisions with a washcloth or towel. Do not take baths, swim, or use a hot tub until your health care provider approves.  Eat foods that are high in fiber, such as raw fruits and vegetables, whole grains, beans, and nuts. Meats should be lean cut. Avoid canned, processed, and fried foods.  Drink enough fluid to keep your urine clear or pale yellow.  Weigh yourself every day. This helps identify if you are retaining fluid that may make your heart and lungs work harder.  Rest and limit activity as directed by your health care provider. You may be instructed to:  Stop any activity  at once if you have chest pain, shortness of breath, irregular heartbeats, or dizziness. Get help right away if you have any of these symptoms.  Move around frequently for short periods or take short walks as directed by your health care provider. Increase your activities gradually. You may need physical therapy or cardiac rehabilitation to help strengthen your muscles and build your endurance.  Avoid lifting, pushing, or pulling anything heavier than 10 lb (4.5 kg) for at least 6 weeks after surgery.  Do not drive until your health care provider approves.  Ask your health care provider when you may return to work.  Ask your health care provider when you may resume sexual activity.  Keep all follow-up visits as directed by your health care provider. This is important. SEEK MEDICAL CARE IF:  You have swelling, redness, increasing pain, or drainage at the site of an incision.  You have a fever.  You have swelling in your ankles or  legs.  You have pain in your legs.   You gain 2 or more pounds (0.9 kg) a day.  You are nauseous or vomit.  You have diarrhea. SEEK IMMEDIATE MEDICAL CARE IF:  You have chest pain that goes to your jaw or arms.  You have shortness of breath.   You have a fast or irregular heartbeat.   You notice a "clicking" in your breastbone (sternum) when you move.   You have numbness or weakness in your arms or legs.  You feel dizzy or light-headed.  MAKE SURE YOU:  Understand these instructions.  Will watch your condition.  Will get help right away if you are not doing well or get worse.   This information is not intended to replace advice given to you by your health care provider. Make sure you discuss any questions you have with your health care provider.   Document Released: 07/01/2005 Document Revised: 01/02/2015 Document Reviewed: 05/21/2013 Elsevier Interactive Patient Education [employer].

## 2016-05-13 ENCOUNTER — Ambulatory Visit (HOSPITAL_BASED_OUTPATIENT_CLINIC_OR_DEPARTMENT_OTHER)
Admission: RE | Admit: 2016-05-13 | Discharge: 2016-05-13 | Disposition: A | Discharge status: Home / Self Care | Payer: Medicare Other | Source: Ambulatory Visit | Attending: Cardiothoracic Surgery | Admitting: Cardiothoracic Surgery

## 2016-05-13 ENCOUNTER — Ambulatory Visit (HOSPITAL_COMMUNITY)
Admission: RE | Admit: 2016-05-13 | Discharge: 2016-05-13 | Disposition: A | Discharge status: Home / Self Care | Payer: Medicare Other | Source: Ambulatory Visit | Attending: Cardiothoracic Surgery | Admitting: Cardiothoracic Surgery

## 2016-05-13 DIAGNOSIS — E785 Hyperlipidemia, unspecified: Secondary | ICD-10-CM | POA: Diagnosis not present

## 2016-05-13 DIAGNOSIS — Z79899 Other long term (current) drug therapy: Secondary | ICD-10-CM | POA: Insufficient documentation

## 2016-05-13 DIAGNOSIS — E119 Type 2 diabetes mellitus without complications: Secondary | ICD-10-CM | POA: Insufficient documentation

## 2016-05-13 DIAGNOSIS — I77810 Thoracic aortic ectasia: Secondary | ICD-10-CM | POA: Diagnosis not present

## 2016-05-13 DIAGNOSIS — Z87891 Personal history of nicotine dependence: Secondary | ICD-10-CM | POA: Insufficient documentation

## 2016-05-13 DIAGNOSIS — G609 Hereditary and idiopathic neuropathy, unspecified: Secondary | ICD-10-CM | POA: Diagnosis not present

## 2016-05-13 DIAGNOSIS — I6523 Occlusion and stenosis of bilateral carotid arteries: Secondary | ICD-10-CM | POA: Diagnosis not present

## 2016-05-13 DIAGNOSIS — Z7982 Long term (current) use of aspirin: Secondary | ICD-10-CM | POA: Diagnosis not present

## 2016-05-13 DIAGNOSIS — Z7984 Long term (current) use of oral hypoglycemic drugs: Secondary | ICD-10-CM | POA: Diagnosis not present

## 2016-05-13 DIAGNOSIS — Z955 Presence of coronary angioplasty implant and graft: Secondary | ICD-10-CM | POA: Insufficient documentation

## 2016-05-13 DIAGNOSIS — Z01818 Encounter for other preprocedural examination: Secondary | ICD-10-CM | POA: Diagnosis not present

## 2016-05-13 DIAGNOSIS — I2510 Atherosclerotic heart disease of native coronary artery without angina pectoris: Secondary | ICD-10-CM

## 2016-05-13 DIAGNOSIS — I10 Essential (primary) hypertension: Secondary | ICD-10-CM | POA: Diagnosis not present

## 2016-05-13 DIAGNOSIS — E039 Hypothyroidism, unspecified: Secondary | ICD-10-CM | POA: Diagnosis not present

## 2016-05-13 DIAGNOSIS — Z85820 Personal history of malignant melanoma of skin: Secondary | ICD-10-CM | POA: Insufficient documentation

## 2016-05-13 LAB — PULMONARY FUNCTION TEST
DL/VA % pred: 86 %
DL/VA: 4.09 ml/min/mmHg/L
DLCO unc % pred: 62 %
DLCO unc: 21.97 ml/min/mmHg
FEF 25-75 Post: 3.08 L/sec
FEF 25-75 Pre: 3.09 L/sec
FEF2575-%Change-Post: 0 %
FEF2575-%Pred-Post: 123 %
FEF2575-%Pred-Pre: 124 %
FEV1-%Change-Post: -1 %
FEV1-%Pred-Post: 94 %
FEV1-%Pred-Pre: 95 %
FEV1-Post: 3.2 L
FEV1-Pre: 3.24 L
FEV1FVC-%Change-Post: 2 %
FEV1FVC-%Pred-Pre: 110 %
FEV6-%Change-Post: -3 %
FEV6-%Pred-Post: 88 %
FEV6-%Pred-Pre: 90 %
FEV6-Post: 3.85 L
FEV6-Pre: 3.97 L
FEV6FVC-%Change-Post: 0 %
FEV6FVC-%Pred-Post: 105 %
FEV6FVC-%Pred-Pre: 105 %
FVC-%Change-Post: -3 %
FVC-%Pred-Post: 83 %
FVC-%Pred-Pre: 86 %
FVC-Post: 3.87 L
FVC-Pre: 4.03 L
Post FEV1/FVC ratio: 83 %
Post FEV6/FVC ratio: 100 %
Pre FEV1/FVC ratio: 80 %
Pre FEV6/FVC Ratio: 99 %
RV % pred: 123 %
RV: 3.24 L
TLC % pred: 97 %
TLC: 7.24 L

## 2016-05-13 MED ORDER — ALBUTEROL SULFATE (2.5 MG/3ML) 0.083% IN NEBU
2.5000 mg | INHALATION_SOLUTION | Freq: Once | RESPIRATORY_TRACT | Status: AC
  Administered 2016-05-13: 2.5 mg via RESPIRATORY_TRACT

## 2016-05-13 NOTE — Progress Notes (Signed)
Pre-op Cardiac Surgery  Carotid Findings:  Bilateral: No significant (1-39%) ICA stenosis. Antegrade vertebral flow.    Upper Extremity Right Left  Brachial Pressures 139 139  Radial Waveforms Tri Tri  Ulnar Waveforms Tri Tri  Palmar Arch (Allen's Test) Decreases >50% with radial compression, normal with ulnar compression Decreases 50% with radial compression, normal with ulnar compression   Findings:  Pedal artery waveforms within normal limits.   Landry Mellow, RDMS, RVT 05/13/2016

## 2016-05-14 NOTE — Pre-Procedure Instructions (Signed)
BAYANI CESENA  05/14/2016     Your procedure is scheduled on May 23.  Report to [facility] Admitting at 5:30 A.M.  Call this number if you have problems the morning of surgery:  [phone]   Remember:  Do not eat food or drink liquids after midnight.  Take these medicines the morning of surgery with A SIP OF WATER Levothyroxine    STOP Asprin today   STOP/ Do not take Aspirin, Aleve, Naproxen, Advil, Ibuprofen, Motrin, Vitamins, Herbs, or Supplements starting today    How to Manage Your Diabetes Before and After Surgery  Why is it important to control my blood sugar before and after surgery? . Improving blood sugar levels before and after surgery helps healing and can limit problems. . A way of improving blood sugar control is eating a healthy diet by: o  Eating less sugar and carbohydrates o  Increasing activity/exercise o  Talking with your doctor about reaching your blood sugar goals . High blood sugars (greater than 180 mg/dL) can raise your risk of infections and slow your recovery, so you will need to focus on controlling your diabetes during the weeks before surgery. . Make sure that the doctor who takes care of your diabetes knows about your planned surgery including the date and location.  How do I manage my blood sugar before surgery? . Check your blood sugar at least 4 times a day, starting 2 days before surgery, to make sure that the level is not too high or low. o Check your blood sugar the morning of your surgery when you wake up and every 2 hours until you get to the Short Stay unit. . If your blood sugar is less than 70 mg/dL, you will need to treat for low blood sugar: o Do not take insulin. o Treat a low blood sugar (less than 70 mg/dL) with  cup of clear juice (cranberry or apple), 4 glucose tablets, OR glucose gel. o Recheck blood sugar in 15 minutes after treatment (to make sure it is greater than 70 mg/dL). If your blood sugar is not  greater than 70 mg/dL on recheck, call [phone] for further instructions. . Report your blood sugar to the short stay nurse when you get to Short Stay.  . If you are admitted to the hospital after surgery: o Your blood sugar will be checked by the staff and you will probably be given insulin after surgery (instead of oral diabetes medicines) to make sure you have good blood sugar levels. o The goal for blood sugar control after surgery is 80-180 mg/dL.  WHAT DO I DO ABOUT MY DIABETES MEDICATION?   Marland Kitchen Do not take oral diabetes medicines (pills) the morning of surgery.    Do not wear jewelry, make-up or nail polish.  Do not wear lotions, powders, or perfumes.  You may not wear deodorant.  Do not shave 48 hours prior to surgery.  Men may shave face and neck.  Do not bring valuables to the hospital.  [facility] is not responsible for any belongings or valuables.  Contacts, dentures or bridgework may not be worn into surgery.  Leave your suitcase in the car.  After surgery it may be brought to your room.  For patients admitted to the hospital, discharge time will be determined by your treatment team.  Patients discharged the day of surgery will not be allowed to drive home.   [place] - Preparing for Surgery  Before surgery,  you can play an important role.  Because skin is not sterile, your skin needs to be as free of germs as possible.  You can reduce the number of germs on you skin by washing with CHG (chlorahexidine gluconate) soap before surgery.  CHG is an antiseptic cleaner which kills germs and bonds with the skin to continue killing germs even after washing.  Please DO NOT use if you have an allergy to CHG or antibacterial soaps.  If your skin becomes reddened/irritated stop using the CHG and inform your nurse when you arrive at Short Stay.  Do not shave (including legs and underarms) for at least 48 hours prior to the first CHG shower.  You may shave your face.  Please  follow these instructions carefully:   1.  Shower with CHG Soap the night before surgery and the morning of Surgery.  2.  If you choose to wash your hair, wash your hair first as usual with your normal shampoo.  3.  After you shampoo, rinse your hair and body thoroughly to remove the shampoo.  4.  Use CHG as you would any other liquid soap.  You can apply CHG directly to the skin and wash gently with scrungie or a clean washcloth.  5.  Apply the CHG Soap to your body ONLY FROM THE NECK DOWN.  Do not use on open wounds or open sores.  Avoid contact with your eyes, ears, mouth and genitals (private parts).  Wash genitals (private parts) with your normal soap.  6.  Wash thoroughly, paying special attention to the area where your surgery will be performed.  7.  Thoroughly rinse your body with warm water from the neck down.  8.  DO NOT shower/wash with your normal soap after using and rinsing off the CHG Soap.  9.  Pat yourself dry with a clean towel.            10.  Wear clean pajamas.            11.  Place clean sheets on your bed the night of your first shower and do not sleep with pets.  Day of Surgery  Do not apply any lotions the morning of surgery.  Please wear clean clothes to the hospital/surgery center.

## 2016-05-16 ENCOUNTER — Encounter (HOSPITAL_COMMUNITY)
Admission: RE | Admit: 2016-05-16 | Discharge: 2016-05-16 | Disposition: A | Discharge status: Home / Self Care | Payer: Medicare Other | Source: Ambulatory Visit | Attending: Cardiothoracic Surgery | Admitting: Cardiothoracic Surgery

## 2016-05-16 ENCOUNTER — Ambulatory Visit (HOSPITAL_COMMUNITY)
Admission: RE | Admit: 2016-05-16 | Discharge: 2016-05-16 | Disposition: A | Discharge status: Home / Self Care | Payer: Medicare Other | Source: Ambulatory Visit | Attending: Cardiothoracic Surgery | Admitting: Cardiothoracic Surgery

## 2016-05-16 ENCOUNTER — Encounter (HOSPITAL_COMMUNITY): Payer: Self-pay

## 2016-05-16 VITALS — BP 144/55 | HR 64 | Temp 97.2°F | Resp 18 | Ht 72.0 in | Wt 216.5 lb

## 2016-05-16 DIAGNOSIS — Z79899 Other long term (current) drug therapy: Secondary | ICD-10-CM | POA: Insufficient documentation

## 2016-05-16 DIAGNOSIS — I10 Essential (primary) hypertension: Secondary | ICD-10-CM

## 2016-05-16 DIAGNOSIS — I2510 Atherosclerotic heart disease of native coronary artery without angina pectoris: Secondary | ICD-10-CM | POA: Insufficient documentation

## 2016-05-16 DIAGNOSIS — Z0183 Encounter for blood typing: Secondary | ICD-10-CM

## 2016-05-16 DIAGNOSIS — D696 Thrombocytopenia, unspecified: Secondary | ICD-10-CM | POA: Diagnosis not present

## 2016-05-16 DIAGNOSIS — E119 Type 2 diabetes mellitus without complications: Secondary | ICD-10-CM

## 2016-05-16 DIAGNOSIS — E039 Hypothyroidism, unspecified: Secondary | ICD-10-CM

## 2016-05-16 DIAGNOSIS — Z833 Family history of diabetes mellitus: Secondary | ICD-10-CM

## 2016-05-16 DIAGNOSIS — Z85820 Personal history of malignant melanoma of skin: Secondary | ICD-10-CM

## 2016-05-16 DIAGNOSIS — I119 Hypertensive heart disease without heart failure: Secondary | ICD-10-CM | POA: Diagnosis not present

## 2016-05-16 DIAGNOSIS — Z8249 Family history of ischemic heart disease and other diseases of the circulatory system: Secondary | ICD-10-CM | POA: Insufficient documentation

## 2016-05-16 DIAGNOSIS — Z87891 Personal history of nicotine dependence: Secondary | ICD-10-CM

## 2016-05-16 DIAGNOSIS — R001 Bradycardia, unspecified: Secondary | ICD-10-CM | POA: Insufficient documentation

## 2016-05-16 DIAGNOSIS — Z01818 Encounter for other preprocedural examination: Secondary | ICD-10-CM | POA: Diagnosis not present

## 2016-05-16 DIAGNOSIS — E785 Hyperlipidemia, unspecified: Secondary | ICD-10-CM | POA: Insufficient documentation

## 2016-05-16 DIAGNOSIS — Z01812 Encounter for preprocedural laboratory examination: Secondary | ICD-10-CM

## 2016-05-16 DIAGNOSIS — E1151 Type 2 diabetes mellitus with diabetic peripheral angiopathy without gangrene: Secondary | ICD-10-CM | POA: Diagnosis not present

## 2016-05-16 DIAGNOSIS — I4510 Unspecified right bundle-branch block: Secondary | ICD-10-CM | POA: Insufficient documentation

## 2016-05-16 LAB — PROTIME-INR
INR: 1.11 (ref 0.00–1.49)
Prothrombin Time: 14.5 seconds (ref 11.6–15.2)

## 2016-05-16 LAB — BLOOD GAS, ARTERIAL
Acid-base deficit: 4.6 mmol/L — ABNORMAL HIGH (ref 0.0–2.0)
Bicarbonate: 19.2 mEq/L — ABNORMAL LOW (ref 20.0–24.0)
Drawn by: 449841
FIO2: 0.21
O2 Saturation: 95.4 %
Patient temperature: 98.6
TCO2: 20.2 mmol/L (ref 0–100)
pCO2 arterial: 31.3 mmHg — ABNORMAL LOW (ref 35.0–45.0)
pH, Arterial: 7.405 (ref 7.350–7.450)
pO2, Arterial: 78.7 mmHg — ABNORMAL LOW (ref 80.0–100.0)

## 2016-05-16 LAB — COMPREHENSIVE METABOLIC PANEL
ALT: 25 U/L (ref 17–63)
AST: 28 U/L (ref 15–41)
Albumin: 4.1 g/dL (ref 3.5–5.0)
Alkaline Phosphatase: 69 U/L (ref 38–126)
Anion gap: 6 (ref 5–15)
BUN: 13 mg/dL (ref 6–20)
CO2: 23 mmol/L (ref 22–32)
Calcium: 9.7 mg/dL (ref 8.9–10.3)
Chloride: 106 mmol/L (ref 101–111)
Creatinine, Ser: 1.29 mg/dL — ABNORMAL HIGH (ref 0.61–1.24)
GFR calc Af Amer: >60 mL/min (ref >60)
GFR calc non Af Amer: 53 mL/min — ABNORMAL LOW (ref >60)
Glucose, Bld: 194 mg/dL — ABNORMAL HIGH (ref 65–99)
Potassium: 4.3 mmol/L (ref 3.5–5.1)
Sodium: 135 mmol/L (ref 135–145)
Total Bilirubin: 0.4 mg/dL (ref 0.3–1.2)
Total Protein: 7 g/dL (ref 6.5–8.1)

## 2016-05-16 LAB — CBC
HCT: 43.8 % (ref 39.0–52.0)
Hemoglobin: 14.1 g/dL (ref 13.0–17.0)
MCH: 27.2 pg (ref 26.0–34.0)
MCHC: 32.2 g/dL (ref 30.0–36.0)
MCV: 84.6 fL (ref 78.0–100.0)
Platelets: 186 10*3/uL (ref 150–400)
RBC: 5.18 MIL/uL (ref 4.22–5.81)
RDW: 12.9 % (ref 11.5–15.5)
WBC: 9.3 10*3/uL (ref 4.0–10.5)

## 2016-05-16 LAB — URINALYSIS, ROUTINE W REFLEX MICROSCOPIC
Bilirubin Urine: NEGATIVE
Glucose, UA: 250 mg/dL — AB
Hgb urine dipstick: NEGATIVE
Ketones, ur: NEGATIVE mg/dL
Leukocytes, UA: NEGATIVE
Nitrite: NEGATIVE
Protein, ur: NEGATIVE mg/dL
Specific Gravity, Urine: 1.021 (ref 1.005–1.030)
pH: 5 (ref 5.0–8.0)

## 2016-05-16 LAB — SURGICAL PCR SCREEN
MRSA, PCR: NEGATIVE
Staphylococcus aureus: NEGATIVE

## 2016-05-16 LAB — TYPE AND SCREEN
ABO/RH(D): O POS
Antibody Screen: NEGATIVE

## 2016-05-16 LAB — APTT: aPTT: 33 seconds (ref 24–37)

## 2016-05-16 LAB — ABO/RH: ABO/RH(D): O POS

## 2016-05-16 LAB — GLUCOSE, CAPILLARY: Glucose-Capillary: 214 mg/dL — ABNORMAL HIGH (ref 65–99)

## 2016-05-16 MED ORDER — EPINEPHRINE HCL 1 MG/ML IJ SOLN
0.0000 ug/min | INTRAMUSCULAR | Status: DC
  Filled 2016-05-16: qty 4

## 2016-05-16 MED ORDER — CHLORHEXIDINE GLUCONATE 0.12 % MT SOLN
15.0000 mL | Freq: Once | OROMUCOSAL | Status: AC
  Administered 2016-05-17: 15 mL via OROMUCOSAL
  Filled 2016-05-16: qty 15

## 2016-05-16 MED ORDER — NITROGLYCERIN IN D5W 200-5 MCG/ML-% IV SOLN
2.0000 ug/min | INTRAVENOUS | Status: DC
  Filled 2016-05-16: qty 250

## 2016-05-16 MED ORDER — POTASSIUM CHLORIDE 2 MEQ/ML IV SOLN
80.0000 meq | INTRAVENOUS | Status: DC
  Filled 2016-05-16: qty 40

## 2016-05-16 MED ORDER — PLASMA-LYTE 148 IV SOLN
INTRAVENOUS | Status: AC
  Administered 2016-05-17: 500 mL
  Filled 2016-05-16: qty 2.5

## 2016-05-16 MED ORDER — SODIUM CHLORIDE 0.9 % IV SOLN
INTRAVENOUS | Status: AC
  Administered 2016-05-17: 69.8 mL/h via INTRAVENOUS
  Administered 2016-05-17: 13:00:00 via INTRAVENOUS
  Filled 2016-05-16: qty 40

## 2016-05-16 MED ORDER — PHENYLEPHRINE HCL 10 MG/ML IJ SOLN
30.0000 ug/min | INTRAVENOUS | Status: DC
  Filled 2016-05-16: qty 2

## 2016-05-16 MED ORDER — DEXMEDETOMIDINE HCL IN NACL 400 MCG/100ML IV SOLN
0.1000 ug/kg/h | INTRAVENOUS | Status: DC
  Filled 2016-05-16: qty 100

## 2016-05-16 MED ORDER — DEXTROSE 5 % IV SOLN
1.5000 g | INTRAVENOUS | Status: DC
  Filled 2016-05-16 (×2): qty 1.5

## 2016-05-16 MED ORDER — MAGNESIUM SULFATE 50 % IJ SOLN
40.0000 meq | INTRAMUSCULAR | Status: DC
  Filled 2016-05-16: qty 10

## 2016-05-16 MED ORDER — SODIUM CHLORIDE 0.9 % IV SOLN
INTRAVENOUS | Status: DC
  Filled 2016-05-16: qty 30

## 2016-05-16 MED ORDER — DEXTROSE 5 % IV SOLN
750.0000 mg | INTRAVENOUS | Status: DC
  Filled 2016-05-16: qty 750

## 2016-05-16 MED ORDER — DOPAMINE-DEXTROSE 3.2-5 MG/ML-% IV SOLN
0.0000 ug/kg/min | INTRAVENOUS | Status: DC
  Filled 2016-05-16: qty 250

## 2016-05-16 MED ORDER — SODIUM CHLORIDE 0.9 % IV SOLN
INTRAVENOUS | Status: DC
  Filled 2016-05-16: qty 2.5

## 2016-05-16 MED ORDER — METOPROLOL TARTRATE 12.5 MG HALF TABLET
12.5000 mg | ORAL_TABLET | ORAL | Status: AC
  Administered 2016-05-17: 12.5 mg via ORAL

## 2016-05-16 MED ORDER — VANCOMYCIN HCL 10 G IV SOLR
1500.0000 mg | INTRAVENOUS | Status: DC
  Filled 2016-05-16: qty 1500

## 2016-05-17 ENCOUNTER — Inpatient Hospital Stay (HOSPITAL_COMMUNITY)
Admission: RE | Admit: 2016-05-17 | Discharge: 2016-05-23 | DRG: 236 | Disposition: A | Discharge status: Home / Self Care | Payer: Medicare Other | Source: Ambulatory Visit | Attending: Cardiothoracic Surgery | Admitting: Cardiothoracic Surgery

## 2016-05-17 ENCOUNTER — Encounter (HOSPITAL_COMMUNITY): Payer: Self-pay | Admitting: Certified Registered Nurse Anesthetist

## 2016-05-17 ENCOUNTER — Other Ambulatory Visit (HOSPITAL_COMMUNITY): Payer: Self-pay

## 2016-05-17 ENCOUNTER — Encounter (HOSPITAL_COMMUNITY)
Admission: RE | Disposition: A | Discharge status: Home / Self Care | Payer: Self-pay | Source: Ambulatory Visit | Attending: Cardiothoracic Surgery

## 2016-05-17 ENCOUNTER — Inpatient Hospital Stay (HOSPITAL_COMMUNITY): Payer: Medicare Other

## 2016-05-17 ENCOUNTER — Inpatient Hospital Stay (HOSPITAL_COMMUNITY): Payer: Medicare Other | Admitting: Certified Registered Nurse Anesthetist

## 2016-05-17 DIAGNOSIS — E8770 Fluid overload, unspecified: Secondary | ICD-10-CM | POA: Diagnosis not present

## 2016-05-17 DIAGNOSIS — Z8249 Family history of ischemic heart disease and other diseases of the circulatory system: Secondary | ICD-10-CM

## 2016-05-17 DIAGNOSIS — T82855A Stenosis of coronary artery stent, initial encounter: Secondary | ICD-10-CM | POA: Diagnosis present

## 2016-05-17 DIAGNOSIS — Z85820 Personal history of malignant melanoma of skin: Secondary | ICD-10-CM | POA: Diagnosis not present

## 2016-05-17 DIAGNOSIS — Z09 Encounter for follow-up examination after completed treatment for conditions other than malignant neoplasm: Secondary | ICD-10-CM

## 2016-05-17 DIAGNOSIS — Z9689 Presence of other specified functional implants: Secondary | ICD-10-CM

## 2016-05-17 DIAGNOSIS — E669 Obesity, unspecified: Secondary | ICD-10-CM | POA: Diagnosis present

## 2016-05-17 DIAGNOSIS — I119 Hypertensive heart disease without heart failure: Secondary | ICD-10-CM | POA: Diagnosis not present

## 2016-05-17 DIAGNOSIS — K5900 Constipation, unspecified: Secondary | ICD-10-CM | POA: Diagnosis not present

## 2016-05-17 DIAGNOSIS — Y831 Surgical operation with implant of artificial internal device as the cause of abnormal reaction of the patient, or of later complication, without mention of misadventure at the time of the procedure: Secondary | ICD-10-CM | POA: Diagnosis present

## 2016-05-17 DIAGNOSIS — I2510 Atherosclerotic heart disease of native coronary artery without angina pectoris: Secondary | ICD-10-CM | POA: Diagnosis not present

## 2016-05-17 DIAGNOSIS — D62 Acute posthemorrhagic anemia: Secondary | ICD-10-CM | POA: Diagnosis not present

## 2016-05-17 DIAGNOSIS — E039 Hypothyroidism, unspecified: Secondary | ICD-10-CM | POA: Diagnosis present

## 2016-05-17 DIAGNOSIS — Z87891 Personal history of nicotine dependence: Secondary | ICD-10-CM | POA: Diagnosis not present

## 2016-05-17 DIAGNOSIS — Z7982 Long term (current) use of aspirin: Secondary | ICD-10-CM | POA: Diagnosis not present

## 2016-05-17 DIAGNOSIS — J9811 Atelectasis: Secondary | ICD-10-CM | POA: Diagnosis not present

## 2016-05-17 DIAGNOSIS — E785 Hyperlipidemia, unspecified: Secondary | ICD-10-CM | POA: Diagnosis present

## 2016-05-17 DIAGNOSIS — Z7984 Long term (current) use of oral hypoglycemic drugs: Secondary | ICD-10-CM | POA: Diagnosis not present

## 2016-05-17 DIAGNOSIS — Z833 Family history of diabetes mellitus: Secondary | ICD-10-CM

## 2016-05-17 DIAGNOSIS — Z801 Family history of malignant neoplasm of trachea, bronchus and lung: Secondary | ICD-10-CM

## 2016-05-17 DIAGNOSIS — I454 Nonspecific intraventricular block: Secondary | ICD-10-CM | POA: Diagnosis not present

## 2016-05-17 DIAGNOSIS — D696 Thrombocytopenia, unspecified: Secondary | ICD-10-CM | POA: Diagnosis not present

## 2016-05-17 DIAGNOSIS — G609 Hereditary and idiopathic neuropathy, unspecified: Secondary | ICD-10-CM | POA: Diagnosis present

## 2016-05-17 DIAGNOSIS — Z951 Presence of aortocoronary bypass graft: Secondary | ICD-10-CM | POA: Diagnosis present

## 2016-05-17 DIAGNOSIS — I080 Rheumatic disorders of both mitral and aortic valves: Secondary | ICD-10-CM | POA: Diagnosis not present

## 2016-05-17 DIAGNOSIS — E1151 Type 2 diabetes mellitus with diabetic peripheral angiopathy without gangrene: Secondary | ICD-10-CM | POA: Diagnosis not present

## 2016-05-17 DIAGNOSIS — Z6829 Body mass index (BMI) 29.0-29.9, adult: Secondary | ICD-10-CM | POA: Diagnosis not present

## 2016-05-17 DIAGNOSIS — Z803 Family history of malignant neoplasm of breast: Secondary | ICD-10-CM | POA: Diagnosis not present

## 2016-05-17 HISTORY — PX: TEE WITHOUT CARDIOVERSION: SHX5443

## 2016-05-17 HISTORY — DX: Atherosclerotic heart disease of native coronary artery without angina pectoris: I25.10

## 2016-05-17 HISTORY — PX: CORONARY ARTERY BYPASS GRAFT: SHX141

## 2016-05-17 LAB — CBC
HCT: 31.7 % — ABNORMAL LOW (ref 39.0–52.0)
HCT: 25.3 % — ABNORMAL LOW (ref 39.0–52.0)
Hemoglobin: 10.5 g/dL — ABNORMAL LOW (ref 13.0–17.0)
Hemoglobin: 8.3 g/dL — ABNORMAL LOW (ref 13.0–17.0)
MCH: 27.8 pg (ref 26.0–34.0)
MCH: 27 pg (ref 26.0–34.0)
MCHC: 33.1 g/dL (ref 30.0–36.0)
MCHC: 32.8 g/dL (ref 30.0–36.0)
MCV: 83.9 fL (ref 78.0–100.0)
MCV: 82.4 fL (ref 78.0–100.0)
Platelets: 152 10*3/uL (ref 150–400)
Platelets: 124 10*3/uL — ABNORMAL LOW (ref 150–400)
RBC: 3.78 MIL/uL — ABNORMAL LOW (ref 4.22–5.81)
RBC: 3.07 MIL/uL — ABNORMAL LOW (ref 4.22–5.81)
RDW: 13.1 % (ref 11.5–15.5)
RDW: 13 % (ref 11.5–15.5)
WBC: 24.3 10*3/uL — ABNORMAL HIGH (ref 4.0–10.5)
WBC: 15.7 10*3/uL — ABNORMAL HIGH (ref 4.0–10.5)

## 2016-05-17 LAB — GLUCOSE, CAPILLARY
Glucose-Capillary: 169 mg/dL — ABNORMAL HIGH (ref 65–99)
Glucose-Capillary: 99 mg/dL (ref 65–99)
Glucose-Capillary: 117 mg/dL — ABNORMAL HIGH (ref 65–99)
Glucose-Capillary: 114 mg/dL — ABNORMAL HIGH (ref 65–99)

## 2016-05-17 LAB — POCT I-STAT 3, ART BLOOD GAS (G3+)
Acid-base deficit: 6 mmol/L — ABNORMAL HIGH (ref 0.0–2.0)
Acid-base deficit: 5 mmol/L — ABNORMAL HIGH (ref 0.0–2.0)
Acid-base deficit: 1 mmol/L (ref 0.0–2.0)
Bicarbonate: 21.1 mEq/L (ref 20.0–24.0)
Bicarbonate: 20.9 mEq/L (ref 20.0–24.0)
Bicarbonate: 24.9 mEq/L — ABNORMAL HIGH (ref 20.0–24.0)
Bicarbonate: 23.1 mEq/L (ref 20.0–24.0)
O2 Saturation: 97 %
O2 Saturation: 97 %
O2 Saturation: 99 %
O2 Saturation: 97 %
Patient temperature: 36
Patient temperature: 35.9
Patient temperature: 38
Patient temperature: 38.1
TCO2: 23 mmol/L (ref 0–100)
TCO2: 22 mmol/L (ref 0–100)
TCO2: 26 mmol/L (ref 0–100)
TCO2: 24 mmol/L (ref 0–100)
pCO2 arterial: 43.9 mmHg (ref 35.0–45.0)
pCO2 arterial: 37.7 mmHg (ref 35.0–45.0)
pCO2 arterial: 40.3 mmHg (ref 35.0–45.0)
pCO2 arterial: 36.2 mmHg (ref 35.0–45.0)
pH, Arterial: 7.285 — ABNORMAL LOW (ref 7.350–7.450)
pH, Arterial: 7.346 — ABNORMAL LOW (ref 7.350–7.450)
pH, Arterial: 7.404 (ref 7.350–7.450)
pH, Arterial: 7.418 (ref 7.350–7.450)
pO2, Arterial: 99 mmHg (ref 80.0–100.0)
pO2, Arterial: 89 mmHg (ref 80.0–100.0)
pO2, Arterial: 120 mmHg — ABNORMAL HIGH (ref 80.0–100.0)
pO2, Arterial: 93 mmHg (ref 80.0–100.0)

## 2016-05-17 LAB — CREATININE, SERUM
Creatinine, Ser: 1.05 mg/dL (ref 0.61–1.24)
GFR calc Af Amer: >60 mL/min (ref >60)
GFR calc non Af Amer: >60 mL/min (ref >60)

## 2016-05-17 LAB — MAGNESIUM: Magnesium: 2.3 mg/dL (ref 1.7–2.4)

## 2016-05-17 LAB — POCT I-STAT, CHEM 8
BUN: 8 mg/dL (ref 6–20)
Calcium, Ion: 1.13 mmol/L (ref 1.13–1.30)
Chloride: 103 mmol/L (ref 101–111)
Creatinine, Ser: 1 mg/dL (ref 0.61–1.24)
Glucose, Bld: 115 mg/dL — ABNORMAL HIGH (ref 65–99)
HCT: 23 % — ABNORMAL LOW (ref 39.0–52.0)
Hemoglobin: 7.8 g/dL — ABNORMAL LOW (ref 13.0–17.0)
Potassium: 4.3 mmol/L (ref 3.5–5.1)
Sodium: 139 mmol/L (ref 135–145)
TCO2: 24 mmol/L (ref 0–100)

## 2016-05-17 LAB — PROTIME-INR
INR: 1.61 — ABNORMAL HIGH (ref 0.00–1.49)
Prothrombin Time: 19.2 seconds — ABNORMAL HIGH (ref 11.6–15.2)

## 2016-05-17 LAB — HEMOGLOBIN AND HEMATOCRIT, BLOOD
HCT: 29.8 % — ABNORMAL LOW (ref 39.0–52.0)
Hemoglobin: 9.8 g/dL — ABNORMAL LOW (ref 13.0–17.0)

## 2016-05-17 LAB — POCT I-STAT 4, (NA,K, GLUC, HGB,HCT)
Glucose, Bld: 109 mg/dL — ABNORMAL HIGH (ref 65–99)
HCT: 33 % — ABNORMAL LOW (ref 39.0–52.0)
Hemoglobin: 11.2 g/dL — ABNORMAL LOW (ref 13.0–17.0)
Potassium: 4.1 mmol/L (ref 3.5–5.1)
Sodium: 140 mmol/L (ref 135–145)

## 2016-05-17 LAB — HEMOGLOBIN A1C
Hgb A1c MFr Bld: 7.7 % — ABNORMAL HIGH (ref 4.8–5.6)
Mean Plasma Glucose: 174 mg/dL

## 2016-05-17 LAB — APTT: aPTT: 36 seconds (ref 24–37)

## 2016-05-17 LAB — PLATELET COUNT: Platelets: 147 10*3/uL — ABNORMAL LOW (ref 150–400)

## 2016-05-17 SURGERY — CORONARY ARTERY BYPASS GRAFTING (CABG)
Anesthesia: General | Site: Chest

## 2016-05-17 MED ORDER — ALBUMIN HUMAN 5 % IV SOLN
INTRAVENOUS | Status: DC | PRN
  Administered 2016-05-17 (×2): via INTRAVENOUS

## 2016-05-17 MED ORDER — METOPROLOL TARTRATE 5 MG/5ML IV SOLN: 2.5000 mg | INTRAVENOUS | Status: DC | PRN

## 2016-05-17 MED ORDER — SODIUM CHLORIDE 0.9 % IV SOLN
INTRAVENOUS | Status: DC
  Administered 2016-05-17: 20 mL/h via INTRAVENOUS

## 2016-05-17 MED ORDER — SODIUM CHLORIDE 0.9 % IV SOLN
250.0000 mL | INTRAVENOUS | Status: DC
  Administered 2016-05-17: 250 mL via INTRAVENOUS

## 2016-05-17 MED ORDER — LACTATED RINGERS IV SOLN
500.0000 mL | Freq: Once | INTRAVENOUS | Status: AC | PRN
  Administered 2016-05-18: 500 mL via INTRAVENOUS

## 2016-05-17 MED ORDER — ALBUMIN HUMAN 5 % IV SOLN: 12.5000 g | Freq: Once | INTRAVENOUS | Status: AC

## 2016-05-17 MED ORDER — DEXMEDETOMIDINE HCL IN NACL 400 MCG/100ML IV SOLN
INTRAVENOUS | Status: DC | PRN
  Administered 2016-05-17: .3 ug/kg/h via INTRAVENOUS

## 2016-05-17 MED ORDER — CHLORHEXIDINE GLUCONATE 0.12% ORAL RINSE (MEDLINE KIT)
15.0000 mL | Freq: Two times a day (BID) | OROMUCOSAL | Status: DC
  Administered 2016-05-17 – 2016-05-18 (×2): 15 mL via OROMUCOSAL

## 2016-05-17 MED ORDER — ANTISEPTIC ORAL RINSE SOLUTION (CORINZ)
7.0000 mL | Freq: Four times a day (QID) | OROMUCOSAL | Status: DC
  Administered 2016-05-18 (×2): 7 mL via OROMUCOSAL

## 2016-05-17 MED ORDER — INSULIN REGULAR BOLUS VIA INFUSION
0.0000 [IU] | Freq: Three times a day (TID) | INTRAVENOUS | Status: DC
  Filled 2016-05-17: qty 10

## 2016-05-17 MED ORDER — LIDOCAINE HCL (CARDIAC) 20 MG/ML IV SOLN
INTRAVENOUS | Status: DC | PRN
  Administered 2016-05-17: 80 mg via INTRAVENOUS

## 2016-05-17 MED ORDER — PROTAMINE SULFATE 10 MG/ML IV SOLN
INTRAVENOUS | Status: DC | PRN
  Administered 2016-05-17: 270 mg via INTRAVENOUS

## 2016-05-17 MED ORDER — LACTATED RINGERS IV SOLN
INTRAVENOUS | Status: DC | PRN
  Administered 2016-05-17 (×2): via INTRAVENOUS

## 2016-05-17 MED ORDER — NITROGLYCERIN 0.2 MG/ML ON CALL CATH LAB
INTRAVENOUS | Status: DC | PRN
  Administered 2016-05-17 (×2): 40 ug via INTRAVENOUS

## 2016-05-17 MED ORDER — SODIUM BICARBONATE 8.4 % IV SOLN: 25.0000 meq | Freq: Once | INTRAVENOUS | Status: AC

## 2016-05-17 MED ORDER — ACETAMINOPHEN 160 MG/5ML PO SOLN: 1000.0000 mg | Freq: Four times a day (QID) | ORAL | Status: DC

## 2016-05-17 MED ORDER — MAGNESIUM SULFATE 4 GM/100ML IV SOLN
4.0000 g | Freq: Once | INTRAVENOUS | Status: AC
  Administered 2016-05-17: 4 g via INTRAVENOUS
  Filled 2016-05-17: qty 100

## 2016-05-17 MED ORDER — OXYCODONE HCL 5 MG PO TABS: 5.0000 mg | ORAL_TABLET | ORAL | Status: DC | PRN

## 2016-05-17 MED ORDER — SODIUM CHLORIDE 0.9 % IJ SOLN
INTRAMUSCULAR | Status: DC | PRN
  Administered 2016-05-17: 09:00:00 via TOPICAL

## 2016-05-17 MED ORDER — HEMOSTATIC AGENTS (NO CHARGE) OPTIME
TOPICAL | Status: DC | PRN
  Administered 2016-05-17 (×2): 1 via TOPICAL

## 2016-05-17 MED ORDER — SODIUM BICARBONATE 8.4 % IV SOLN
25.0000 meq | Freq: Once | INTRAVENOUS | Status: AC
  Administered 2016-05-17: 25 meq via INTRAVENOUS

## 2016-05-17 MED ORDER — MIDAZOLAM HCL 5 MG/5ML IJ SOLN
INTRAMUSCULAR | Status: DC | PRN
  Administered 2016-05-17 (×2): 2 mg via INTRAVENOUS
  Administered 2016-05-17: 1 mg via INTRAVENOUS
  Administered 2016-05-17 (×2): 2 mg via INTRAVENOUS
  Administered 2016-05-17: 3 mg via INTRAVENOUS

## 2016-05-17 MED ORDER — ROCURONIUM BROMIDE 50 MG/5ML IV SOLN
INTRAVENOUS | Status: AC
  Filled 2016-05-17: qty 2

## 2016-05-17 MED ORDER — FENTANYL CITRATE (PF) 100 MCG/2ML IJ SOLN
INTRAMUSCULAR | Status: DC | PRN
  Administered 2016-05-17: 50 ug via INTRAVENOUS
  Administered 2016-05-17 (×2): 150 ug via INTRAVENOUS
  Administered 2016-05-17: 100 ug via INTRAVENOUS
  Administered 2016-05-17: 200 ug via INTRAVENOUS
  Administered 2016-05-17: 50 ug via INTRAVENOUS
  Administered 2016-05-17: 250 ug via INTRAVENOUS
  Administered 2016-05-17: 50 ug via INTRAVENOUS
  Administered 2016-05-17: 100 ug via INTRAVENOUS
  Administered 2016-05-17: 200 ug via INTRAVENOUS
  Administered 2016-05-17: 50 ug via INTRAVENOUS

## 2016-05-17 MED ORDER — DEXTROSE 5 % IV SOLN: 1.5000 g | Freq: Two times a day (BID) | INTRAVENOUS | Status: DC

## 2016-05-17 MED ORDER — BISACODYL 5 MG PO TBEC
10.0000 mg | DELAYED_RELEASE_TABLET | Freq: Every day | ORAL | Status: DC
  Administered 2016-05-18 – 2016-05-19 (×2): 10 mg via ORAL
  Filled 2016-05-17 (×2): qty 2

## 2016-05-17 MED ORDER — SODIUM CHLORIDE 0.9% FLUSH: 3.0000 mL | INTRAVENOUS | Status: DC | PRN

## 2016-05-17 MED ORDER — METOPROLOL TARTRATE 25 MG/10 ML ORAL SUSPENSION: 12.5000 mg | Freq: Two times a day (BID) | ORAL | Status: DC

## 2016-05-17 MED ORDER — HEPARIN SODIUM (PORCINE) 1000 UNIT/ML IJ SOLN
INTRAMUSCULAR | Status: AC
  Filled 2016-05-17: qty 1

## 2016-05-17 MED ORDER — SODIUM CHLORIDE 0.9 % IV SOLN
250.0000 [IU] | INTRAVENOUS | Status: DC | PRN
  Administered 2016-05-17: 1 [IU]/h via INTRAVENOUS

## 2016-05-17 MED ORDER — SODIUM CHLORIDE 0.9 % IV SOLN
INTRAVENOUS | Status: DC
  Filled 2016-05-17: qty 2.5

## 2016-05-17 MED ORDER — FENTANYL CITRATE (PF) 250 MCG/5ML IJ SOLN
INTRAMUSCULAR | Status: AC
  Filled 2016-05-17: qty 5

## 2016-05-17 MED ORDER — MIDAZOLAM HCL 10 MG/2ML IJ SOLN
INTRAMUSCULAR | Status: AC
  Filled 2016-05-17: qty 2

## 2016-05-17 MED ORDER — METOPROLOL TARTRATE 12.5 MG HALF TABLET
ORAL_TABLET | ORAL | Status: AC
  Filled 2016-05-17: qty 1

## 2016-05-17 MED ORDER — PHENYLEPHRINE 40 MCG/ML (10ML) SYRINGE FOR IV PUSH (FOR BLOOD PRESSURE SUPPORT)
PREFILLED_SYRINGE | INTRAVENOUS | Status: AC
  Filled 2016-05-17: qty 10

## 2016-05-17 MED ORDER — DEXMEDETOMIDINE HCL IN NACL 200 MCG/50ML IV SOLN
0.0000 ug/kg/h | INTRAVENOUS | Status: DC
  Administered 2016-05-17: 0.4 ug/kg/h via INTRAVENOUS
  Filled 2016-05-17: qty 50

## 2016-05-17 MED ORDER — METOPROLOL TARTRATE 12.5 MG HALF TABLET
12.5000 mg | ORAL_TABLET | Freq: Two times a day (BID) | ORAL | Status: DC
  Administered 2016-05-19: 12.5 mg via ORAL
  Filled 2016-05-17 (×2): qty 1

## 2016-05-17 MED ORDER — LEVOTHYROXINE SODIUM 100 MCG PO TABS
100.0000 ug | ORAL_TABLET | Freq: Every day | ORAL | Status: DC
  Administered 2016-05-18 – 2016-05-23 (×6): 100 ug via ORAL
  Filled 2016-05-17 (×6): qty 1

## 2016-05-17 MED ORDER — PANTOPRAZOLE SODIUM 40 MG PO TBEC
40.0000 mg | DELAYED_RELEASE_TABLET | Freq: Every day | ORAL | Status: DC
  Administered 2016-05-19: 40 mg via ORAL
  Filled 2016-05-17: qty 1

## 2016-05-17 MED ORDER — VANCOMYCIN HCL IN DEXTROSE 1-5 GM/200ML-% IV SOLN
1000.0000 mg | Freq: Once | INTRAVENOUS | Status: AC
  Administered 2016-05-17: 1000 mg via INTRAVENOUS
  Filled 2016-05-17: qty 200

## 2016-05-17 MED ORDER — ACETAMINOPHEN 160 MG/5ML PO SOLN: 650.0000 mg | Freq: Once | ORAL | Status: AC

## 2016-05-17 MED ORDER — MIDAZOLAM HCL 2 MG/2ML IJ SOLN
INTRAMUSCULAR | Status: AC
  Filled 2016-05-17: qty 2

## 2016-05-17 MED ORDER — LACTATED RINGERS IV SOLN
INTRAVENOUS | Status: DC
  Administered 2016-05-18: 02:00:00 via INTRAVENOUS

## 2016-05-17 MED ORDER — MORPHINE SULFATE (PF) 2 MG/ML IV SOLN
2.0000 mg | INTRAVENOUS | Status: DC | PRN
  Administered 2016-05-17: 2 mg via INTRAVENOUS
  Filled 2016-05-17: qty 1

## 2016-05-17 MED ORDER — PHENYLEPHRINE HCL 10 MG/ML IJ SOLN
0.0000 ug/min | INTRAVENOUS | Status: DC
  Administered 2016-05-18: 45 ug/min via INTRAVENOUS
  Administered 2016-05-18: 40 ug/min via INTRAVENOUS
  Filled 2016-05-17 (×4): qty 2

## 2016-05-17 MED ORDER — ROSUVASTATIN CALCIUM 10 MG PO TABS
40.0000 mg | ORAL_TABLET | Freq: Every day | ORAL | Status: DC
  Administered 2016-05-18 – 2016-05-22 (×5): 40 mg via ORAL
  Filled 2016-05-17: qty 2
  Filled 2016-05-17: qty 4
  Filled 2016-05-17 (×3): qty 1
  Filled 2016-05-17 (×2): qty 4

## 2016-05-17 MED ORDER — SODIUM CHLORIDE 0.9 % IV SOLN
INTRAVENOUS | Status: DC
  Filled 2016-05-17: qty 40

## 2016-05-17 MED ORDER — ROCURONIUM BROMIDE 100 MG/10ML IV SOLN
INTRAVENOUS | Status: DC | PRN
  Administered 2016-05-17: 20 mg via INTRAVENOUS
  Administered 2016-05-17: 60 mg via INTRAVENOUS
  Administered 2016-05-17: 50 mg via INTRAVENOUS
  Administered 2016-05-17: 40 mg via INTRAVENOUS
  Administered 2016-05-17: 50 mg via INTRAVENOUS

## 2016-05-17 MED ORDER — ONDANSETRON HCL 4 MG/2ML IJ SOLN
4.0000 mg | Freq: Four times a day (QID) | INTRAMUSCULAR | Status: DC | PRN
  Administered 2016-05-17: 4 mg via INTRAVENOUS
  Filled 2016-05-17: qty 2

## 2016-05-17 MED ORDER — LACTATED RINGERS IV SOLN: INTRAVENOUS | Status: DC

## 2016-05-17 MED ORDER — ACETAMINOPHEN 500 MG PO TABS
1000.0000 mg | ORAL_TABLET | Freq: Four times a day (QID) | ORAL | Status: DC
  Administered 2016-05-18 – 2016-05-19 (×5): 1000 mg via ORAL
  Filled 2016-05-17 (×6): qty 2

## 2016-05-17 MED ORDER — ASPIRIN EC 325 MG PO TBEC
325.0000 mg | DELAYED_RELEASE_TABLET | Freq: Every day | ORAL | Status: DC
  Administered 2016-05-18 – 2016-05-19 (×2): 325 mg via ORAL
  Filled 2016-05-17 (×2): qty 1

## 2016-05-17 MED ORDER — ACETAMINOPHEN 650 MG RE SUPP
650.0000 mg | Freq: Once | RECTAL | Status: AC
  Administered 2016-05-17: 650 mg via RECTAL

## 2016-05-17 MED ORDER — TRAMADOL HCL 50 MG PO TABS
50.0000 mg | ORAL_TABLET | ORAL | Status: DC | PRN
  Administered 2016-05-18 (×2): 50 mg via ORAL
  Administered 2016-05-18: 100 mg via ORAL
  Administered 2016-05-18: 50 mg via ORAL
  Filled 2016-05-17: qty 2
  Filled 2016-05-17 (×3): qty 1

## 2016-05-17 MED ORDER — LACTATED RINGERS IV SOLN
INTRAVENOUS | Status: DC | PRN
  Administered 2016-05-17 (×2): via INTRAVENOUS

## 2016-05-17 MED ORDER — 0.9 % SODIUM CHLORIDE (POUR BTL) OPTIME
TOPICAL | Status: DC | PRN
  Administered 2016-05-17: 6000 mL

## 2016-05-17 MED ORDER — BISACODYL 10 MG RE SUPP: 10.0000 mg | Freq: Every day | RECTAL | Status: DC

## 2016-05-17 MED ORDER — SODIUM CHLORIDE 0.9 % IV SOLN
1.0000 g | Freq: Once | INTRAVENOUS | Status: AC
  Filled 2016-05-17: qty 10

## 2016-05-17 MED ORDER — FAMOTIDINE IN NACL 20-0.9 MG/50ML-% IV SOLN
20.0000 mg | Freq: Two times a day (BID) | INTRAVENOUS | Status: AC
  Administered 2016-05-17: 20 mg via INTRAVENOUS

## 2016-05-17 MED ORDER — PHENYLEPHRINE HCL 10 MG/ML IJ SOLN
10.0000 mg | INTRAMUSCULAR | Status: DC | PRN
  Administered 2016-05-17: 20 ug/min via INTRAVENOUS

## 2016-05-17 MED ORDER — FENTANYL CITRATE (PF) 250 MCG/5ML IJ SOLN
INTRAMUSCULAR | Status: AC
  Filled 2016-05-17: qty 25

## 2016-05-17 MED ORDER — VANCOMYCIN HCL 1000 MG IV SOLR
1000.0000 mg | INTRAVENOUS | Status: DC | PRN
  Administered 2016-05-17: 1000 mg via INTRAVENOUS

## 2016-05-17 MED ORDER — CALCIUM CHLORIDE 10 % IV SOLN
1.0000 g | Freq: Once | INTRAVENOUS | Status: DC
  Administered 2016-05-17: 1 g via INTRAVENOUS

## 2016-05-17 MED ORDER — PROTAMINE SULFATE 10 MG/ML IV SOLN
50.0000 mg | Freq: Once | INTRAVENOUS | Status: DC
  Filled 2016-05-17: qty 5

## 2016-05-17 MED ORDER — DEXTROSE 5 % IV SOLN
20.0000 mg | INTRAVENOUS | Status: DC | PRN
  Administered 2016-05-17: 10 ug/min via INTRAVENOUS

## 2016-05-17 MED ORDER — SODIUM CHLORIDE 0.9 % IV SOLN
Freq: Once | INTRAVENOUS | Status: AC
  Administered 2016-05-17: 16:00:00 via INTRAVENOUS

## 2016-05-17 MED ORDER — PHENYLEPHRINE HCL 10 MG/ML IJ SOLN
INTRAMUSCULAR | Status: DC | PRN
  Administered 2016-05-17 (×2): 40 ug via INTRAVENOUS
  Administered 2016-05-17: 20 ug via INTRAVENOUS
  Administered 2016-05-17 (×6): 40 ug via INTRAVENOUS
  Administered 2016-05-17 (×2): 20 ug via INTRAVENOUS
  Administered 2016-05-17: 40 ug via INTRAVENOUS
  Administered 2016-05-17: 20 ug via INTRAVENOUS

## 2016-05-17 MED ORDER — ROCURONIUM BROMIDE 50 MG/5ML IV SOLN
INTRAVENOUS | Status: AC
  Filled 2016-05-17: qty 3

## 2016-05-17 MED ORDER — HEPARIN SODIUM (PORCINE) 1000 UNIT/ML IJ SOLN
INTRAMUSCULAR | Status: DC | PRN
  Administered 2016-05-17: 27000 [IU] via INTRAVENOUS

## 2016-05-17 MED ORDER — MIDAZOLAM HCL 2 MG/2ML IJ SOLN
2.0000 mg | INTRAMUSCULAR | Status: DC | PRN
Start: 2016-05-17 — End: 2016-05-19

## 2016-05-17 MED ORDER — PROPOFOL 10 MG/ML IV BOLUS
INTRAVENOUS | Status: AC
  Filled 2016-05-17: qty 20

## 2016-05-17 MED ORDER — MORPHINE SULFATE (PF) 2 MG/ML IV SOLN
1.0000 mg | INTRAVENOUS | Status: AC | PRN
  Filled 2016-05-17: qty 1

## 2016-05-17 MED ORDER — DEXTROSE 5 % IV SOLN
1.5000 g | Freq: Two times a day (BID) | INTRAVENOUS | Status: AC
  Administered 2016-05-17 – 2016-05-19 (×4): 1.5 g via INTRAVENOUS
  Filled 2016-05-17 (×4): qty 1.5

## 2016-05-17 MED ORDER — PROTAMINE SULFATE 10 MG/ML IV SOLN
INTRAVENOUS | Status: AC
  Filled 2016-05-17: qty 25

## 2016-05-17 MED ORDER — SODIUM CHLORIDE 0.9 % IV SOLN
INTRAVENOUS | Status: DC | PRN
  Administered 2016-05-17 (×2): via INTRAVENOUS

## 2016-05-17 MED ORDER — ALBUMIN HUMAN 5 % IV SOLN
250.0000 mL | INTRAVENOUS | Status: AC | PRN
  Administered 2016-05-17 (×4): 250 mL via INTRAVENOUS
  Filled 2016-05-17 (×2): qty 250

## 2016-05-17 MED ORDER — CHLORHEXIDINE GLUCONATE 4 % EX LIQD: 30.0000 mL | CUTANEOUS | Status: DC

## 2016-05-17 MED ORDER — DOCUSATE SODIUM 100 MG PO CAPS
200.0000 mg | ORAL_CAPSULE | Freq: Every day | ORAL | Status: DC
  Administered 2016-05-18 – 2016-05-19 (×2): 200 mg via ORAL
  Filled 2016-05-17 (×2): qty 2

## 2016-05-17 MED ORDER — LACTATED RINGERS IV SOLN
INTRAVENOUS | Status: DC | PRN
  Administered 2016-05-17: 07:00:00 via INTRAVENOUS

## 2016-05-17 MED ORDER — PROTAMINE SULFATE 10 MG/ML IV SOLN
INTRAVENOUS | Status: AC
  Filled 2016-05-17: qty 5

## 2016-05-17 MED ORDER — POTASSIUM CHLORIDE 10 MEQ/50ML IV SOLN: 10.0000 meq | INTRAVENOUS | Status: AC

## 2016-05-17 MED ORDER — NITROGLYCERIN IN D5W 200-5 MCG/ML-% IV SOLN: 0.0000 ug/min | INTRAVENOUS | Status: DC

## 2016-05-17 MED ORDER — NITROGLYCERIN IN D5W 200-5 MCG/ML-% IV SOLN
INTRAVENOUS | Status: DC | PRN
  Administered 2016-05-17: 10 ug/min via INTRAVENOUS

## 2016-05-17 MED ORDER — SODIUM CHLORIDE 0.45 % IV SOLN
INTRAVENOUS | Status: DC | PRN
  Administered 2016-05-17: 20 mL/h via INTRAVENOUS

## 2016-05-17 MED ORDER — CHLORHEXIDINE GLUCONATE 0.12 % MT SOLN
15.0000 mL | OROMUCOSAL | Status: AC
  Administered 2016-05-17: 15 mL via OROMUCOSAL
  Filled 2016-05-17: qty 15

## 2016-05-17 MED ORDER — DEXMEDETOMIDINE HCL IN NACL 200 MCG/50ML IV SOLN
INTRAVENOUS | Status: AC
  Filled 2016-05-17: qty 50

## 2016-05-17 MED ORDER — ASPIRIN 81 MG PO CHEW: 324.0000 mg | CHEWABLE_TABLET | Freq: Every day | ORAL | Status: DC

## 2016-05-17 MED ORDER — ARTIFICIAL TEARS OP OINT
TOPICAL_OINTMENT | OPHTHALMIC | Status: DC | PRN
  Administered 2016-05-17: 1 via OPHTHALMIC

## 2016-05-17 MED ORDER — SODIUM CHLORIDE 0.9% FLUSH
3.0000 mL | Freq: Two times a day (BID) | INTRAVENOUS | Status: DC
  Administered 2016-05-18 – 2016-05-19 (×3): 3 mL via INTRAVENOUS

## 2016-05-17 MED ORDER — PROTAMINE SULFATE 10 MG/ML IV SOLN
25.0000 mg | Freq: Once | INTRAVENOUS | Status: AC
  Administered 2016-05-17: 25 mg via INTRAVENOUS

## 2016-05-17 MED ORDER — DEXTROSE 5 % IV SOLN
1.5000 g | INTRAVENOUS | Status: DC | PRN
  Administered 2016-05-17: 1.5 g via INTRAVENOUS
  Administered 2016-05-17: .75 g via INTRAVENOUS

## 2016-05-17 MED ORDER — PROPOFOL 10 MG/ML IV BOLUS
INTRAVENOUS | Status: DC | PRN
  Administered 2016-05-17 (×2): 20 mg via INTRAVENOUS
  Administered 2016-05-17: 10 mg via INTRAVENOUS
  Administered 2016-05-17: 150 mg via INTRAVENOUS

## 2016-05-17 MED FILL — Heparin Sodium (Porcine) Inj 1000 Unit/ML: INTRAMUSCULAR | Qty: 30 | Status: AC

## 2016-05-17 MED FILL — Magnesium Sulfate Inj 50%: INTRAMUSCULAR | Qty: 10 | Status: AC

## 2016-05-17 MED FILL — Potassium Chloride Inj 2 mEq/ML: INTRAVENOUS | Qty: 40 | Status: AC

## 2016-05-17 SURGICAL SUPPLY — 72 items
BAG DECANTER FOR FLEXI CONT (MISCELLANEOUS) ×3 IMPLANT
BANDAGE ELASTIC 4 VELCRO ST LF (GAUZE/BANDAGES/DRESSINGS) ×3 IMPLANT
BANDAGE ELASTIC 6 VELCRO ST LF (GAUZE/BANDAGES/DRESSINGS) ×3 IMPLANT
BENZOIN TINCTURE PRP APPL 2/3 (GAUZE/BANDAGES/DRESSINGS) ×3 IMPLANT
BLADE STERNUM SYSTEM 6 (BLADE) ×3 IMPLANT
BLADE SURG 11 STRL SS (BLADE) ×3 IMPLANT
BNDG GAUZE ELAST 4 BULKY (GAUZE/BANDAGES/DRESSINGS) ×3 IMPLANT
CANISTER SUCTION 2500CC (MISCELLANEOUS) ×3 IMPLANT
CATH CPB KIT GERHARDT (MISCELLANEOUS) ×3 IMPLANT
CATH THORACIC 28FR (CATHETERS) ×3 IMPLANT
COVER MAYO STAND STRL (DRAPES) ×3 IMPLANT
CRADLE DONUT ADULT HEAD (MISCELLANEOUS) ×3 IMPLANT
DERMABOND ADVANCED (GAUZE/BANDAGES/DRESSINGS) ×1
DERMABOND ADVANCED .7 DNX12 (GAUZE/BANDAGES/DRESSINGS) ×2 IMPLANT
DRAIN CHANNEL 28F RND 3/8 FF (WOUND CARE) ×3 IMPLANT
DRAPE CARDIOVASCULAR INCISE (DRAPES) ×1
DRAPE SLUSH/WARMER DISC (DRAPES) ×3 IMPLANT
DRAPE SRG 135X102X78XABS (DRAPES) ×2 IMPLANT
DRSG AQUACEL AG ADV 3.5X14 (GAUZE/BANDAGES/DRESSINGS) ×3 IMPLANT
ELECT BLADE 4.0 EZ CLEAN MEGAD (MISCELLANEOUS) ×3
ELECT REM PT RETURN 9FT ADLT (ELECTROSURGICAL) ×6
ELECTRODE BLDE 4.0 EZ CLN MEGD (MISCELLANEOUS) ×2 IMPLANT
ELECTRODE REM PT RTRN 9FT ADLT (ELECTROSURGICAL) ×4 IMPLANT
FELT TEFLON 1X6 (MISCELLANEOUS) ×3 IMPLANT
GAUZE SPONGE 4X4 12PLY STRL (GAUZE/BANDAGES/DRESSINGS) ×6 IMPLANT
GLOVE BIO SURGEON STRL SZ 6.5 (GLOVE) ×21 IMPLANT
GLOVE BIO SURGEON STRL SZ7 (GLOVE) ×12 IMPLANT
GLOVE BIOGEL PI IND STRL 7.5 (GLOVE) ×6 IMPLANT
GLOVE BIOGEL PI INDICATOR 7.5 (GLOVE) ×3
GOWN STRL REUS W/ TWL LRG LVL3 (GOWN DISPOSABLE) ×16 IMPLANT
GOWN STRL REUS W/TWL LRG LVL3 (GOWN DISPOSABLE) ×8
HEMOSTAT POWDER SURGIFOAM 1G (HEMOSTASIS) ×9 IMPLANT
HEMOSTAT SURGICEL 2X14 (HEMOSTASIS) ×3 IMPLANT
KIT BASIN OR (CUSTOM PROCEDURE TRAY) ×3 IMPLANT
KIT CATH SUCT 8FR (CATHETERS) ×3 IMPLANT
KIT ROOM TURNOVER OR (KITS) ×3 IMPLANT
KIT SUCTION CATH 14FR (SUCTIONS) ×6 IMPLANT
KIT VASOVIEW 6 PRO VH 2400 (KITS) ×3 IMPLANT
LEAD PACING MYOCARDI (MISCELLANEOUS) ×3 IMPLANT
MARKER GRAFT CORONARY BYPASS (MISCELLANEOUS) ×18 IMPLANT
NS IRRIG 1000ML POUR BTL (IV SOLUTION) ×18 IMPLANT
PACK OPEN HEART (CUSTOM PROCEDURE TRAY) ×3 IMPLANT
PAD ARMBOARD 7.5X6 YLW CONV (MISCELLANEOUS) ×6 IMPLANT
PAD ELECT DEFIB RADIOL ZOLL (MISCELLANEOUS) ×3 IMPLANT
PENCIL BUTTON HOLSTER BLD 10FT (ELECTRODE) ×3 IMPLANT
PUNCH AORTIC ROTATE  4.5MM 8IN (MISCELLANEOUS) ×3 IMPLANT
SET CARDIOPLEGIA MPS 5001102 (MISCELLANEOUS) ×3 IMPLANT
SPONGE LAP 18X18 X RAY DECT (DISPOSABLE) ×12 IMPLANT
SURGIFLO W/THROMBIN 8M KIT (HEMOSTASIS) ×3 IMPLANT
SUT BONE WAX W31G (SUTURE) ×3 IMPLANT
SUT MNCRL AB 4-0 PS2 18 (SUTURE) ×3 IMPLANT
SUT PROLENE 3 0 SH1 36 (SUTURE) ×6 IMPLANT
SUT PROLENE 4 0 TF (SUTURE) ×6 IMPLANT
SUT PROLENE 6 0 CC (SUTURE) ×12 IMPLANT
SUT PROLENE 7 0 BV 1 (SUTURE) ×18 IMPLANT
SUT PROLENE 7 0 BV1 MDA (SUTURE) ×6 IMPLANT
SUT PROLENE 8 0 BV175 6 (SUTURE) ×9 IMPLANT
SUT STEEL 6MS V (SUTURE) ×3 IMPLANT
SUT STEEL SZ 6 DBL 3X14 BALL (SUTURE) ×3 IMPLANT
SUT VIC AB 1 CTX 18 (SUTURE) ×6 IMPLANT
SUT VIC AB 2-0 CT1 27 (SUTURE) ×1
SUT VIC AB 2-0 CT1 TAPERPNT 27 (SUTURE) ×2 IMPLANT
SUTURE E-PAK OPEN HEART (SUTURE) ×3 IMPLANT
SYSTEM SAHARA CHEST DRAIN ATS (WOUND CARE) ×3 IMPLANT
TAPE CLOTH SURG 4X10 WHT LF (GAUZE/BANDAGES/DRESSINGS) ×3 IMPLANT
TAPE PAPER 2X10 WHT MICROPORE (GAUZE/BANDAGES/DRESSINGS) ×3 IMPLANT
TOWEL OR 17X24 6PK STRL BLUE (TOWEL DISPOSABLE) ×6 IMPLANT
TOWEL OR 17X26 10 PK STRL BLUE (TOWEL DISPOSABLE) ×6 IMPLANT
TRAY FOLEY IC TEMP SENS 16FR (CATHETERS) ×3 IMPLANT
TUBING INSUFFLATION (TUBING) ×3 IMPLANT
UNDERPAD 30X30 INCONTINENT (UNDERPADS AND DIAPERS) ×3 IMPLANT
WATER STERILE IRR 1000ML POUR (IV SOLUTION) ×6 IMPLANT

## 2016-05-17 NOTE — Progress Notes (Signed)
Dr Servando Snare notified of CI levels.  Orders received.

## 2016-05-17 NOTE — Brief Op Note (Addendum)
      [address]       [place] [address]             [phone]      05/17/2016  11:29 AM  PATIENT:  Jack Wade  75 y.o. male  PRE-OPERATIVE DIAGNOSIS:  CAD  POST-OPERATIVE DIAGNOSIS:  CAD  PROCEDURE:  TRANSESOPHAGEAL ECHOCARDIOGRAM (TEE), MEDIAN STERNOTOMY for CORONARY ARTERY BYPASS GRAFTING (CABG) x 6 (LIMA to LAD, SVG to DIAGONAL, SVG SEQUENTIALLY to OM2 and RAMUS INTERMEDIATE, SVG SEQUENTIALLY to DISTAL RCA and PDA) with EVH from [place] and LEFT INTERNAL MAMMARY ARTERY  SURGEON:  Surgeon(s) and Role:    * [name], MD - Primary  PHYSICIAN ASSISTANT: Lars Pinks PA-C  ANESTHESIA:   general  EBL:  Total I/O In: -  Out: 300 [Urine:300]  DRAINS: Chest tubes placed in the mediastinal and pleural spaces   COUNTS CORRECT:  Missing one 8-0 needle  DICTATION: .Dragon Dictation  PLAN OF CARE: Admit to inpatient   PATIENT DISPOSITION:  ICU - intubated and hemodynamically stable.   Delay start of Pharmacological VTE agent (>24hrs) due to surgical blood loss or risk of bleeding: yes  BASELINE WEIGHT: 98 kg

## 2016-05-17 NOTE — Progress Notes (Signed)
RT notified of desire to start rapid wean protocol. Will continue to closely monitor. Eleonore Chiquito RN 2 [place]

## 2016-05-17 NOTE — Progress Notes (Signed)
CT surgery p.m. Rounds  Patient hemodynamically stable after CABG Patient had coagulopathy postoperatively now chest tube drainage remains less than 100 cc/h for almost 3  consecutive hours. We'll plan ventral wean soon.

## 2016-05-17 NOTE — Anesthesia Procedure Notes (Addendum)
Central Venous Catheter Insertion Performed by: anesthesiologist Patient location: Pre-op. Preanesthetic checklist: patient identified, IV checked, site marked, risks and benefits discussed, surgical consent, monitors and equipment checked, pre-op evaluation, timeout performed and anesthesia consent Position: Trendelenburg Lidocaine 1% used for infiltration Landmarks identified and Seldinger technique used Catheter size: 8.5 Fr Central line and PA cath was placed.Sheath introducer Swan type and PA catheter depth:thermodilation and 50PA Cath depth:50 Procedure performed using ultrasound guided technique. Attempts: 1 Following insertion, line sutured and dressing applied. Post procedure assessment: blood return through all ports, free fluid flow and no air. Patient tolerated the procedure well with no immediate complications.   Procedure Name: Intubation Date/Time: 05/17/2016 8:04 AM Performed by: Merdis Delay Pre-anesthesia Checklist: Patient identified, Timeout performed, Emergency Drugs available, Suction available and Patient being monitored Patient Re-evaluated:Patient Re-evaluated prior to inductionOxygen Delivery Method: Circle system utilized Preoxygenation: Pre-oxygenation with 100% oxygen Intubation Type: IV induction Ventilation: Mask ventilation without difficulty Laryngoscope Size: Mac and 4 Grade View: Grade III Tube type: Oral Tube size: 8.0 mm Number of attempts: 2 Airway Equipment and Method: Bougie stylet Placement Confirmation: ETT inserted through vocal cords under direct vision,  breath sounds checked- equal and bilateral,  CO2 detector and positive ETCO2 Secured at: 22 cm Tube secured with: Tape Dental Injury: Teeth and Oropharynx as per pre-operative assessment  Difficulty Due To: Difficulty was unanticipated Comments: Recessed chin- bottom of arytenoids viewed only. Anterior airway. Recommend glidescope.   DL x1 by CRNA with MAC 4- bottom of arytenoids  only--> esoph intub. ETT removed. DL again with MAC 4 by Rose--> poor view--> bougie utilized.

## 2016-05-17 NOTE — Anesthesia Postprocedure Evaluation (Signed)
Anesthesia Post Note  Patient: Jack Wade  Procedure(s) Performed: Procedure(s) (LRB): CORONARY ARTERY BYPASS GRAFTING (CABG) x 6 using left mammory artery and right greater saphenous vein harvested endoscopically. [place] to LAD, SVG to Diagonal, SVG Sequential to OM2 and ramus intermediate, SVG Sequential to RCA and PDA (N/A) TRANSESOPHAGEAL ECHOCARDIOGRAM (TEE) (N/A)  Patient location during evaluation: SICU Anesthesia Type: General Level of consciousness: patient remains intubated per anesthesia plan Pain management: pain level controlled Vital Signs Assessment: post-procedure vital signs reviewed and stable Respiratory status: patient remains intubated per anesthesia plan Cardiovascular status: stable Postop Assessment: no signs of nausea or vomiting Anesthetic complications: no    Last Vitals:  Filed Vitals:   05/17/16 0622 05/17/16 1455  BP:    Pulse:    Temp: 37 C   Resp:  16    Last Pain: There were no vitals filed for this visit.               Jack Wade S

## 2016-05-17 NOTE — Procedures (Addendum)
Extubation Procedure Note  Patient Details:   Name: Jack Wade DOB: May 09, 1941 MRN: [id number]   Airway Documentation:  Airway 8 mm (Active)  Secured at (cm) 23 cm 05/17/2016  8:29 PM  Measured From Lips 05/17/2016  3:20 PM  Secured Location Right 05/17/2016  3:20 PM  Secured By Pink Tape 05/17/2016  3:20 PM  Tube Holder Repositioned Yes 05/17/2016  3:20 PM  Cuff Pressure (cm H2O) 26 cm H2O 05/17/2016  3:20 PM  Site Condition Dry 05/17/2016  3:20 PM    Evaluation  O2 sats: currently acceptable Complications: No apparent complications Patient did tolerate procedure well. Bilateral Breath Sounds: Clear, Diminished   Yes  Extubated per rapid wean SICU protocol. Patient oriented to time and place. Patient VC of 800 and NIF of -20 pre extubation.  [name] 05/17/2016, 9:31 PM

## 2016-05-17 NOTE — Progress Notes (Signed)
1550:  Dr Servando Snare notified of pts increasing in CT output over last hr and since arriving on unit.  Notified of PT/INR.  Orders received.

## 2016-05-17 NOTE — Anesthesia Preprocedure Evaluation (Addendum)
Anesthesia Evaluation  Patient identified by MRN, date of birth, ID band Patient awake    Reviewed: Allergy & Precautions, NPO status , Patient's Chart, lab work & pertinent test results  Airway Mallampati: II  TM Distance: >3 FB Neck ROM: Full    Dental no notable dental hx. (+) Teeth Intact, Dental Advisory Given   Pulmonary neg pulmonary ROS, former smoker,    Pulmonary exam normal breath sounds clear to auscultation       Cardiovascular hypertension, Pt. on medications + CAD and + Peripheral Vascular Disease  Normal cardiovascular exam Rhythm:Regular Rate:Normal  Left ventricle: The cavity size was normal. Wall thickness was  increased in a pattern of mild LVH. Systolic function was normal.  The estimated ejection fraction was in the range of 55% to 60%.  Wall motion was normal; there were no regional wall motion  abnormalities. Doppler parameters are consistent with abnormal  left ventricular relaxation (grade 1 diastolic dysfunction). - Aortic valve: There was trivial regurgitation. - Aortic root: The aortic root was mildly dilated. - Ascending aorta: The ascending aorta was mildly dilated.  Impressions:  - Normal LV systolic function; grade 1 diastolic dysfunction;  trace AI.   Neuro/Psych negative neurological ROS  negative psych ROS   GI/Hepatic negative GI ROS, Neg liver ROS,   Endo/Other  diabetes  Renal/GU negative Renal ROS  negative genitourinary   Musculoskeletal negative musculoskeletal ROS (+)   Abdominal   Peds negative pediatric ROS (+)  Hematology negative hematology ROS (+)   Anesthesia Other Findings   Reproductive/Obstetrics negative OB ROS                           Anesthesia Physical Anesthesia Plan  ASA: IV  Anesthesia Plan: General   Post-op Pain Management:    Induction: Intravenous  Airway Management Planned: Oral ETT  Additional  Equipment: Arterial line, PA Cath, TEE and Ultrasound Guidance Line Placement  Intra-op Plan:   Post-operative Plan: Post-operative intubation/ventilation  Informed Consent: I have reviewed the patients History and Physical, chart, labs and discussed the procedure including the risks, benefits and alternatives for the proposed anesthesia with the patient or authorized representative who has indicated his/her understanding and acceptance.   Dental advisory given  Plan Discussed with: CRNA and Surgeon  Anesthesia Plan Comments:         Anesthesia Quick Evaluation

## 2016-05-17 NOTE — Transfer of Care (Signed)
Immediate Anesthesia Transfer of Care Note  Patient: CULLY LUCKOW  Procedure(s) Performed: Procedure(s): CORONARY ARTERY BYPASS GRAFTING (CABG) x 6 using left mammory artery and right greater saphenous vein harvested endoscopically. [place] to LAD, SVG to Diagonal, SVG Sequential to OM2 and ramus intermediate, SVG Sequential to RCA and PDA (N/A) TRANSESOPHAGEAL ECHOCARDIOGRAM (TEE) (N/A)  Patient Location: SICU  Anesthesia Type:General  Level of Consciousness: sedated and Patient remains intubated per anesthesia plan  Airway & Oxygen Therapy: Patient remains intubated per anesthesia plan and Patient placed on Ventilator (see vital sign flow sheet for setting)  Post-op Assessment: Report given to RN and Post -op Vital signs reviewed and stable  Post vital signs: Reviewed and stable  Last Vitals:  Filed Vitals:   05/17/16 0559 05/17/16 0622  BP: 147/76   Pulse: 68   Temp:  37 C    Last Pain: There were no vitals filed for this visit.    Patients Stated Pain Goal: 3 (74/82/70 7867)  Complications: No apparent anesthesia complications   Bp 95 systolic with MAP 65 during transport to SICU. VSS during transport to ICU. Pt met my ICU RN and RT. Report given to RN/RT and all questions answered. Pt connected to ICU monitors and continued stability confirmed.

## 2016-05-17 NOTE — H&P (Signed)
Jack Wade       Jack Wade,Jack Wade [id number]             [phone]                    Gloyd C Gulino [place] Medical Record [id number] Date of Birth: January 18, 1941  Referring: Dr Tamala Julian Primary Care: Penni Homans, MD  Chief Complaint:    CAD.   History of Present Illness:    Jack Wade 75 y.o. male is seen in the office consideration of coronary artery bypass grafting. The patient has a known history of coronary occlusive disease having had 2 stents placed in the proximal right coronary artery in June 1998. He had cardiac catheterization done yesterday because of increasing symptoms of substernal chest pain while walking with neighbors in the neighborhood and also while cutting his grass. He notes his symptoms have been increasing recently. Because of these symptoms he had a nuclear stress test with mild inferior lateral and apical lateral wall mild ischemia ejection fraction 58%. He notes the symptoms are very similar to the discomfort that he had prior to his stent placement in 1998. The patient is a known diabetic on metformin and Glucotrol. He has had no known myocardial infarction.        Current Activity/ Functional Status:  Patient is independent with mobility/ambulation, transfers, ADL's, IADL's.   Zubrod Score: At the time of surgery this patient's most appropriate activity status/level should be described as: []     0    Normal activity, no symptoms [x]     1    Restricted in physical strenuous activity but ambulatory, able to do out light work []     2    Ambulatory and capable of self care, unable to do work activities, up and about               >50 % of waking hours                              []     3    Only limited self care, in bed greater than 50% of waking hours []     4    Completely disabled, no self care, confined to bed or chair []     5    Moribund   Past Medical History  Diagnosis Date  . Hyperlipidemia   . Hypertension     . CAD (coronary artery disease)     2 stents  . Diabetes type 2, controlled ([place])   . Chicken pox   . Measles   . Appendicitis   . Mumps as a child  . Melanoma ([place])     Scalp. 2012  . Hypothyroidism   . Thyroid disease     [place] Hyperthyroidism  . Dysphagia, unspecified(787.20) 03/20/2014  . Back pain 02/09/2015  . Aortic calcification ([place]) 02/09/2015  . Hereditary and idiopathic peripheral neuropathy 02/09/2015  . Medicare annual wellness visit, subsequent 06/14/2015  . Diabetes mellitus type 2 in obese ([place]) 04/11/2016    Past Surgical History  Procedure Laterality Date  . Appendectomy    . Coronary angioplasty with stent placement      2 stents  . Cardiac catheterization    . Back surgery      x2 lumbar  . Rotator cuff repair Right   . Wisdom tooth extraction    . Tonsillectomy and adenoidectomy    .  Skin cancer excision      melanoma on scalp  . Back surgery      late 1990s had 2 surgeries, first surgery lifted a heavy engine ruptured disds, at L4 and L5, cleaned discs no hardware very helpful. 2 years later while recovering from angiogram with weights applied to left leg caused a recurrence and required surgery again in same area with disc repair  . Knee arthroscopy      right knee  . Lumbar laminectomy/decompression microdiscectomy Bilateral 04/10/2015    Procedure: LUMBAR LAMINECTOMY/DECOMPRESSION MICRODISCECTOMY 2 LEVELS;  Surgeon: Ashok Pall, MD;  Location: [facility];  Service: Neurosurgery;  Laterality: Bilateral;  Bilateral L45 L5S1 Laminectomy and Foraminotomy  . Cardiac catheterization N/A 05/11/2016    Procedure: Left Heart Cath and Coronary Angiography;  Surgeon: Belva Crome, MD;  Location: [facility];  Service: Cardiovascular;  Laterality: N/A;  . Cardiac catheterization N/A 05/11/2016    Procedure: Intravascular Pressure Wire/FFR Study;  Surgeon: Belva Crome, MD;  Location: [facility];  Service: Cardiovascular;  Laterality: N/A;     Family History  Problem Relation Age of Onset  . Heart disease Father   . Emphysema Father   . Hypertension Father   . Cancer Father     lung/ healthy  . Heart disease Mother     Deceased  . Cancer Mother     breast  . Hypertension Mother   . Cancer Maternal Grandmother     colon  . Diabetes Brother     type 2  . Heart disease Brother   . Pulmonary embolism Son     vasculiti    Social History   Social History  . Marital Status: Married    Spouse Name: N/A  . Number of Children: 2  . Years of Education: N/A   Occupational History  . Retired [employer]    Social History Main Topics  . Smoking status: Former Smoker -- 2.00 packs/day for 30 years    Types: Cigarettes    Start date: 12/27/1983  . Smokeless tobacco: Never Used  . Alcohol Use: 0.0 oz/week    0 Standard drinks or equivalent per week     Comment: occasionally  . Drug Use: No  . 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    Other Topics Concern  . Not on file   Social History Narrative    History  Smoking status  . Former Smoker -- 2.00 packs/day for 30 years  . Types: Cigarettes  . Start date: 12/27/1983  Smokeless tobacco  . Never Used    History  Alcohol Use  . 0.0 oz/week  . 0 Standard drinks or equivalent per week    Comment: occasionally     No Known Allergies  Current Facility-Administered Medications  Medication Dose Route Frequency Provider Last Rate Last Dose  . aminocaproic acid (AMICAR) 10 g in sodium chloride 0.9 % 100 mL infusion   Intravenous To OR [name], MD      . cefUROXime (ZINACEF) 1.5 g in dextrose 5 % 50 mL IVPB  1.5 g Intravenous To OR [name], MD      . cefUROXime (ZINACEF) 750 mg in dextrose 5 % 50 mL IVPB  750 mg Intravenous To OR [name], MD      . chlorhexidine (HIBICLENS) 4 % liquid 2 application  30 mL Topical UD [name], MD      .  dexmedetomidine  (PRECEDEX) 400 MCG/100ML (4 mcg/mL) infusion  0.1-0.7 mcg/kg/hr Intravenous To OR [name], MD      . DOPamine (INTROPIN) 800 mg in dextrose 5 % 250 mL (3.2 mg/mL) infusion  0-10 mcg/kg/min Intravenous To OR [name], MD      . EPINEPHrine (ADRENALIN) 4 mg in dextrose 5 % 250 mL (0.016 mg/mL) infusion  0-10 mcg/min Intravenous To OR [name], MD      . heparin 2,500 Units, papaverine 30 mg in electrolyte-148 (PLASMALYTE-148) 500 mL irrigation   Irrigation To OR [name], MD      . heparin 30,000 units/NS 1000 mL solution for CELLSAVER   Other To OR [name], MD      . insulin regular (NOVOLIN R,HUMULIN R) 250 Units in sodium chloride 0.9 % 250 mL (1 Units/mL) infusion   Intravenous To OR [name], MD      . magnesium sulfate (IV Push/IM) injection 40 mEq  40 mEq Other To OR [name], MD      . metoprolol tartrate (LOPRESSOR) 12.5 mg tablet           . nitroGLYCERIN 50 mg in dextrose 5 % 250 mL (0.2 mg/mL) infusion  2-200 mcg/min Intravenous To OR [name], MD      . phenylephrine (NEO-SYNEPHRINE) 20 mg in dextrose 5 % 250 mL (0.08 mg/mL) infusion  30-200 mcg/min Intravenous To OR [name], MD      . potassium chloride injection 80 mEq  80 mEq Other To OR [name], MD      . vancomycin (VANCOCIN) 1,500 mg in sodium chloride 0.9 % 250 mL IVPB  1,500 mg Intravenous To OR [name], MD       Facility-Administered Medications Ordered in Other Encounters  Medication Dose Route Frequency Provider Last Rate Last Dose  . fentaNYL (SUBLIMAZE) injection   Intravenous Anesthesia Intra-op Merdis Delay, CRNA   50 mcg at 05/17/16 0640  . lactated ringers infusion   Intravenous Continuous PRN Merdis Delay, CRNA      . lactated ringers infusion   Intravenous Continuous PRN Merdis Delay, CRNA      . midazolam (VERSED) 5 MG/5ML injection   Intravenous Anesthesia Intra-op Merdis Delay, CRNA   1 mg at  05/17/16 [id number]      Review of Systems:     Cardiac Review of Systems: Y or N  Chest Pain [ y   ]  Resting SOB [ n  ] Exertional SOB  [ n ]  Orthopnea [  n]   Pedal Edema [n   ]    Palpitations [ n ] Syncope  [n  ]   Presyncope [ n  ]  General Review of Systems: [Y] = yes [  ]=no Constitional: recent weight change [n  ];  Wt loss over the last 3 months [   ] anorexia [  ]; fatigue [  ]; nausea [  ]; night sweats [  ]; fever [  ]; or chills [  ];          Dental: poor dentition[  ]; Last Dentist visit:   Eye : blurred vision [  ]; diplopia [   ]; vision changes [  ];  Amaurosis fugax[  ]; Resp: cough [  ];  wheezing[  ];  hemoptysis[  ]; shortness of breath[  ]; paroxysmal nocturnal dyspnea[  ]; dyspnea  on exertion[  ]; or orthopnea[  ];  GI:  gallstones[  ], vomiting[  ];  dysphagia[  ]; melena[n  ];  hematochezia [  ]; heartburn[  ];   Hx of  Colonoscopy[y  ]; GU: kidney stones [  ]; hematuria[  ];   dysuria [  ];  nocturia[  ];  history of     obstruction [  ]; urinary frequency [  ]             Skin: rash, swelling[  ];, hair loss[  ];  peripheral edema[  ];  or itching[  ]; Musculosketetal: myalgias[  ];  joint swelling[  ];  joint erythema[  ];  joint pain[y  ];  back pain[  ];  Heme/Lymph: bruising[  ];  bleeding[  ];  anemia[  ];  Neuro: TIA[ n ];  headaches[  ];  stroke[ n ];  vertigo[  ];  seizures[  ];   paresthesias[  ];  difficulty walking[ y ];  Psych:depression[  ]; anxiety[  ];  Endocrine: diabetes[y  ];  thyroid dysfunction[n  ];  Immunizations: Flu up to date Florencio.Farrier  ]; Pneumococcal up to date Florencio.Farrier  ];  Other:  Physical Exam: BP 147/76 mmHg  Pulse 68  Temp(Src) 98.6 F (37 C)  SpO2 100%  PHYSICAL EXAMINATION: General appearance: alert, cooperative, appears stated age and no distress Head: Normocephalic, without obvious abnormality, atraumatic Neck: no adenopathy, no carotid bruit, no JVD, supple, symmetrical, trachea midline and thyroid not enlarged, symmetric, no  tenderness/mass/nodules Lymph nodes: Cervical, supraclavicular, and axillary nodes normal. Resp: clear to auscultation bilaterally Back: symmetric, no curvature. ROM normal. No CVA tenderness. Cardio: regular rate and rhythm, S1, S2 normal, no murmur, click, rub or gallop GI: soft, non-tender; bowel sounds normal; no masses,  no organomegaly and No palpable abdominal aneurysm Extremities: extremities normal, atraumatic, no cyanosis or edema and Homans sign is negative, no sign of DVT Neurologic: Grossly normal Patient has adequate vein for bypass noted at both ankles Has palpable DP and PT pulses bilaterally  Diagnostic Studies & Laboratory data:     Recent Radiology Findings:   Dg Chest 2 View  05/16/2016  CLINICAL DATA:  Coronary artery disease.  Preop heart surgery. EXAM: CHEST  2 VIEW COMPARISON:  None. FINDINGS: The heart size and mediastinal contours are within normal limits. Left coronary stent. Both lungs are clear. The visualized skeletal structures are unremarkable. IMPRESSION: No active cardiopulmonary disease. Electronically Signed   By: Franchot Gallo M.D.   On: 05/16/2016 09:51       Recent Lab Findings: Lab Results  Component Value Date   WBC 9.3 05/16/2016   HGB 14.1 05/16/2016   HCT 43.8 05/16/2016   PLT 186 05/16/2016   GLUCOSE 194* 05/16/2016   CHOL 122 04/11/2016   TRIG 308.0* 04/11/2016   HDL 39.30 04/11/2016   LDLDIRECT 43.0 04/11/2016   LDLCALC 45 12/11/2015   ALT 25 05/16/2016   AST 28 05/16/2016   NA 135 05/16/2016   K 4.3 05/16/2016   CL 106 05/16/2016   CREATININE 1.29* 05/16/2016   BUN 13 05/16/2016   CO2 23 05/16/2016   TSH 3.31 04/11/2016   INR 1.11 05/16/2016   HGBA1C 7.7* 05/16/2016   CATH:  Intravascular Pressure Wire/FFR Study    Left Heart Cath and Coronary Angiography    Conclusion    1. Prox RCA to Mid RCA lesion, 60% stenosed. The lesion was previously treated with a stent (unknown  type). 2. Ramus lesion, 99%  stenosed. 3. LM lesion, 50% stenosed. 4. Mid LAD lesion, 85% stenosed. 5. Ost 2nd Diag lesion, 70% stenosed. 6. Prox Cx to Dist Cx lesion, 80% stenosed. 7. 2nd Mrg lesion, 80% stenosed. 8. RPDA lesion, 75% stenosed. 9. Ost LM to LM lesion, 40% stenosed.  After reviewing digital images diffuse in-stent restenosis in the RCA was of intermediate severity. FFR was performed to determine whether there was significant obstruction within the stented segment. We initially attempted to use Assist device but ultimately because of technical difficulty, we used the Comet system. FFR across the proximal to mid RCA stent was 0.86, suggesting hemodynamically insignificant.   Severe, calcific, three-vessel coronary disease with moderate distal left main, severe proximal to mid LAD disease, severe mid ramus intermedius, severe segmental circumflex, and severe obstruction in the PDA with hemodynamically insignificant obstruction in the previously stented proximal to mid RCA.  Normal left ventricular function with normal hemodynamics.    I have independently reviewed the above  cath films and reviewed the findings with the  patient .  ECHO: LV EF: 55% - 60%  ------------------------------------------------------------------- Indications: CAD of native vessels 414.01.  ------------------------------------------------------------------- History: PMH: Angina pectoris. Risk factors: Current tobacco use. Hypertension. Diabetes mellitus. Dyslipidemia.  ------------------------------------------------------------------- Study Conclusions  - Left ventricle: The cavity size was normal. Wall thickness was  increased in a pattern of mild LVH. Systolic function was normal.  The estimated ejection fraction was in the range of 55% to 60%.  Wall motion was normal; there were no regional wall motion  abnormalities. Doppler parameters are consistent with abnormal  left ventricular relaxation  (grade 1 diastolic dysfunction). - Aortic valve: There was trivial regurgitation. - Aortic root: The aortic root was mildly dilated. - Ascending aorta: The ascending aorta was mildly dilated.  Impressions:  - Normal LV systolic function; grade 1 diastolic dysfunction;  trace AI.  Transthoracic echocardiography. M-mode, complete 2D, spectral Doppler, and color Doppler. Birthdate: Patient birthdate: Mar 03, 1941. Age: Patient is 75 yr old. Sex: Gender: male. BMI: 29.6 kg/m^2. Blood pressure: 139/76 Patient status: Outpatient. Study date: Study date: 05/13/2016. Study time: 08:50 AM. Location: Echo laboratory.  -------------------------------------------------------------------  ------------------------------------------------------------------- Left ventricle: The cavity size was normal. Wall thickness was increased in a pattern of mild LVH. Systolic function was normal. The estimated ejection fraction was in the range of 55% to 60%. Wall motion was normal; there were no regional wall motion abnormalities. Doppler parameters are consistent with abnormal left ventricular relaxation (grade 1 diastolic dysfunction).  ------------------------------------------------------------------- Aortic valve: Trileaflet; mildly thickened leaflets. Mobility was not restricted. Doppler: Transvalvular velocity was within the normal range. There was no stenosis. There was trivial regurgitation.  ------------------------------------------------------------------- Aorta: Aortic root: The aortic root was mildly dilated. Ascending aorta: The ascending aorta was mildly dilated.  ------------------------------------------------------------------- Mitral valve: Structurally normal valve. Mobility was not restricted. Doppler: Transvalvular velocity was within the normal range. There was no evidence for stenosis. There was no regurgitation. Peak gradient (D): 3 mm  Hg.  ------------------------------------------------------------------- Left atrium: The atrium was normal in size.  ------------------------------------------------------------------- Right ventricle: The cavity size was normal. Systolic function was normal.  ------------------------------------------------------------------- Pulmonic valve: Doppler: Transvalvular velocity was within the normal range. There was no evidence for stenosis. There was trivial regurgitation.  ------------------------------------------------------------------- Tricuspid valve: Structurally normal valve. Doppler: Transvalvular velocity was within the normal range. There was trivial regurgitation.  ------------------------------------------------------------------- Right atrium: The atrium was normal in size.  ------------------------------------------------------------------- Pericardium: There was no pericardial effusion.  ------------------------------------------------------------------- Systemic veins: Inferior  vena cava: The vessel was normal in size.  ------------------------------------------------------------------- Measurements  Left ventricle Value Reference LV ID, ED, PLAX chordal 43.4 mm 43 - 52 LV ID, ES, PLAX chordal 24.6 mm 23 - 38 LV fx shortening, PLAX chordal 43 % >=29 LV PW thickness, ED 10.9 mm --------- IVS/LV PW ratio, ED 1.25 <=1.3 Stroke volume, 2D 89 ml --------- Stroke volume/bsa, 2D 39 ml/m^2 --------- LV ejection fraction, 1-p A4C 60 % --------- LV end-diastolic volume, 2-p 76 ml --------- LV end-systolic volume, 2-p 27 ml --------- LV ejection fraction, 2-p 65 %  --------- Stroke volume, 2-p 49 ml --------- LV end-diastolic volume/bsa, 2-p 33 ml/m^2 --------- LV end-systolic volume/bsa, 2-p 12 ml/m^2 --------- Stroke volume/bsa, 2-p 21.7 ml/m^2 --------- LV e&', lateral 10.9 cm/s --------- LV E/e&', lateral 7.28 --------- LV e&', medial 8.16 cm/s --------- LV E/e&', medial 9.72 --------- LV e&', average 9.53 cm/s --------- LV E/e&', average 8.32 --------- Longitudinal strain, TDI 16 % ---------  Ventricular septum Value Reference IVS thickness, ED 13.6 mm ---------  LVOT Value Reference LVOT ID, S 20 mm --------- LVOT area 3.14 cm^2 --------- LVOT peak velocity, S 127 cm/s --------- LVOT mean velocity, S 87.7 cm/s --------- LVOT VTI, S 28.3 cm --------- LVOT peak gradient, S 6 mm Hg ---------  Aorta Value Reference Aortic root ID, ED 38 mm ---------  Left atrium Value Reference LA ID, A-P, ES 26 mm --------- LA ID/bsa, A-P 1.15 cm/m^2 <=2.2 LA volume, S 48.4 ml --------- LA volume/bsa, S 21.4 ml/m^2 --------- LA volume, ES, 1-p A4C 45 ml --------- LA volume/bsa, ES, 1-p A4C 19.9 ml/m^2 --------- LA volume, ES, 1-p  A2C 50 ml --------- LA volume/bsa, ES, 1-p A2C 22.1 ml/m^2 ---------  Mitral valve Value Reference Mitral E-wave peak velocity 79.3 cm/s --------- Mitral A-wave peak velocity 76.3 cm/s --------- Mitral deceleration time (H) 313 ms 150 - 230 Mitral peak gradient, D 3 mm Hg --------- Mitral E/A ratio, peak 1 ---------  Systemic veins Value Reference Estimated CVP 3 mm Hg ---------  Right ventricle Value Reference TAPSE 28.2 mm --------- RV s&', lateral, S 12.5 cm/s ---------  Legend: (L) and (H) mark values outside specified reference range.  ------------------------------------------------------------------- Prepared and Electronically Authenticated by  Kirk Ruths 2017-05-19T11:06:45  Assessment / Plan:   Significant 3 vessel coronary artery disease and diabetic male, I reviewed the films with the patient is wife and recommended proceeding with coronary artery bypass grafting wrist and options were discussed with him in detail. He is agreeable with proceeding with coronary artery bypass grafting  The goals risks and alternatives of the planned surgical procedure CABG have been discussed with the patient in detail. The risks of the procedure including death, infection, stroke, myocardial infarction, bleeding, blood transfusion have all been discussed specifically.  I have quoted [name] a 2 % of perioperative mortality and a complication rate as high as 40 %. The patient's questions have been answered.[name] is willing  to proceed with the planned procedure.    In addition to other potential risks and complications  from the surgery, I have made the patient aware of the recent [place] concerning the risk of infection by Myocobacterium chimaera related to the use of [employer]ert 3T heater-cooler equipment during cardiac surgery. I discussed with the patient the low risk of infection, as well as our compliance with the most current FDA recommendations to minimize infection and testing of all devices for contamination. The patient has been made aware of the limited alternatives  to immediately replacing the current equipment. The patient has been informed regarding the risks associated with waiting to proceed with needed surgery and that such risks are greater than the risk of infection related to the use of the heater-cooler device. I did make the patient aware that after careful review of the patients having cardiac surgery at [facility] we have no evidence that heater/cooler related infections have occurred at [facility]. We discussed that this is a slow-growing bacterium, such that it can take some period of time for symptoms to develop.   [name] MD      [address],[place] [address] Office [phone]   Beeper [phone]  05/17/2016 7:02 AM

## 2016-05-17 NOTE — Progress Notes (Signed)
  Echocardiogram Echocardiogram Transesophageal has been performed.  Jack Wade 05/17/2016, 9:13 AM

## 2016-05-18 ENCOUNTER — Encounter (HOSPITAL_COMMUNITY): Payer: Self-pay | Admitting: Cardiothoracic Surgery

## 2016-05-18 ENCOUNTER — Inpatient Hospital Stay (HOSPITAL_COMMUNITY): Payer: Medicare Other

## 2016-05-18 LAB — BASIC METABOLIC PANEL
Anion gap: 8 (ref 5–15)
BUN: 8 mg/dL (ref 6–20)
CO2: 22 mmol/L (ref 22–32)
Calcium: 8.2 mg/dL — ABNORMAL LOW (ref 8.9–10.3)
Chloride: 105 mmol/L (ref 101–111)
Creatinine, Ser: 1.15 mg/dL (ref 0.61–1.24)
GFR calc Af Amer: >60 mL/min (ref >60)
GFR calc non Af Amer: >60 mL/min (ref >60)
Glucose, Bld: 128 mg/dL — ABNORMAL HIGH (ref 65–99)
Potassium: 4.3 mmol/L (ref 3.5–5.1)
Sodium: 135 mmol/L (ref 135–145)

## 2016-05-18 LAB — PREPARE FRESH FROZEN PLASMA
Unit division: 0
Unit division: 0

## 2016-05-18 LAB — POCT I-STAT, CHEM 8
BUN: 9 mg/dL (ref 6–20)
BUN: 10 mg/dL (ref 6–20)
BUN: 10 mg/dL (ref 6–20)
BUN: 9 mg/dL (ref 6–20)
BUN: 12 mg/dL (ref 6–20)
Calcium, Ion: 0.96 mmol/L — ABNORMAL LOW (ref 1.13–1.30)
Calcium, Ion: 1.04 mmol/L — ABNORMAL LOW (ref 1.13–1.30)
Calcium, Ion: 1.09 mmol/L — ABNORMAL LOW (ref 1.13–1.30)
Calcium, Ion: 1.01 mmol/L — ABNORMAL LOW (ref 1.13–1.30)
Calcium, Ion: 1.14 mmol/L (ref 1.13–1.30)
Chloride: 99 mmol/L — ABNORMAL LOW (ref 101–111)
Chloride: 99 mmol/L — ABNORMAL LOW (ref 101–111)
Chloride: 100 mmol/L — ABNORMAL LOW (ref 101–111)
Chloride: 101 mmol/L (ref 101–111)
Chloride: 100 mmol/L — ABNORMAL LOW (ref 101–111)
Creatinine, Ser: 0.6 mg/dL — ABNORMAL LOW (ref 0.61–1.24)
Creatinine, Ser: 0.7 mg/dL (ref 0.61–1.24)
Creatinine, Ser: 0.7 mg/dL (ref 0.61–1.24)
Creatinine, Ser: 0.8 mg/dL (ref 0.61–1.24)
Creatinine, Ser: 1.2 mg/dL (ref 0.61–1.24)
Glucose, Bld: 140 mg/dL — ABNORMAL HIGH (ref 65–99)
Glucose, Bld: 153 mg/dL — ABNORMAL HIGH (ref 65–99)
Glucose, Bld: 149 mg/dL — ABNORMAL HIGH (ref 65–99)
Glucose, Bld: 136 mg/dL — ABNORMAL HIGH (ref 65–99)
Glucose, Bld: 161 mg/dL — ABNORMAL HIGH (ref 65–99)
HCT: 30 % — ABNORMAL LOW (ref 39.0–52.0)
HCT: 29 % — ABNORMAL LOW (ref 39.0–52.0)
HCT: 29 % — ABNORMAL LOW (ref 39.0–52.0)
HCT: 26 % — ABNORMAL LOW (ref 39.0–52.0)
HCT: 23 % — ABNORMAL LOW (ref 39.0–52.0)
Hemoglobin: 10.2 g/dL — ABNORMAL LOW (ref 13.0–17.0)
Hemoglobin: 9.9 g/dL — ABNORMAL LOW (ref 13.0–17.0)
Hemoglobin: 9.9 g/dL — ABNORMAL LOW (ref 13.0–17.0)
Hemoglobin: 8.8 g/dL — ABNORMAL LOW (ref 13.0–17.0)
Hemoglobin: 7.8 g/dL — ABNORMAL LOW (ref 13.0–17.0)
Potassium: 4.4 mmol/L (ref 3.5–5.1)
Potassium: 4.5 mmol/L (ref 3.5–5.1)
Potassium: 5.4 mmol/L — ABNORMAL HIGH (ref 3.5–5.1)
Potassium: 4.1 mmol/L (ref 3.5–5.1)
Potassium: 4.3 mmol/L (ref 3.5–5.1)
Sodium: 140 mmol/L (ref 135–145)
Sodium: 140 mmol/L (ref 135–145)
Sodium: 136 mmol/L (ref 135–145)
Sodium: 138 mmol/L (ref 135–145)
Sodium: 136 mmol/L (ref 135–145)
TCO2: 26 mmol/L (ref 0–100)
TCO2: 27 mmol/L (ref 0–100)
TCO2: 24 mmol/L (ref 0–100)
TCO2: 20 mmol/L (ref 0–100)
TCO2: 21 mmol/L (ref 0–100)

## 2016-05-18 LAB — POCT I-STAT 3, ART BLOOD GAS (G3+)
Acid-base deficit: 4 mmol/L — ABNORMAL HIGH (ref 0.0–2.0)
Bicarbonate: 24.6 mEq/L — ABNORMAL HIGH (ref 20.0–24.0)
Bicarbonate: 20.7 mEq/L (ref 20.0–24.0)
O2 Saturation: 100 %
O2 Saturation: 98 %
TCO2: 26 mmol/L (ref 0–100)
TCO2: 22 mmol/L (ref 0–100)
pCO2 arterial: 38.8 mmHg (ref 35.0–45.0)
pCO2 arterial: 36 mmHg (ref 35.0–45.0)
pH, Arterial: 7.41 (ref 7.350–7.450)
pH, Arterial: 7.368 (ref 7.350–7.450)
pO2, Arterial: 468 mmHg — ABNORMAL HIGH (ref 80.0–100.0)
pO2, Arterial: 104 mmHg — ABNORMAL HIGH (ref 80.0–100.0)

## 2016-05-18 LAB — CBC
HCT: 26.7 % — ABNORMAL LOW (ref 39.0–52.0)
HCT: 22.7 % — ABNORMAL LOW (ref 39.0–52.0)
Hemoglobin: 8.6 g/dL — ABNORMAL LOW (ref 13.0–17.0)
Hemoglobin: 7.3 g/dL — ABNORMAL LOW (ref 13.0–17.0)
MCH: 27.1 pg (ref 26.0–34.0)
MCH: 27.4 pg (ref 26.0–34.0)
MCHC: 32.2 g/dL (ref 30.0–36.0)
MCHC: 32.2 g/dL (ref 30.0–36.0)
MCV: 84.2 fL (ref 78.0–100.0)
MCV: 85.3 fL (ref 78.0–100.0)
Platelets: 134 10*3/uL — ABNORMAL LOW (ref 150–400)
Platelets: 116 10*3/uL — ABNORMAL LOW (ref 150–400)
RBC: 3.17 MIL/uL — ABNORMAL LOW (ref 4.22–5.81)
RBC: 2.66 MIL/uL — ABNORMAL LOW (ref 4.22–5.81)
RDW: 13.3 % (ref 11.5–15.5)
RDW: 13.6 % (ref 11.5–15.5)
WBC: 15.4 10*3/uL — ABNORMAL HIGH (ref 4.0–10.5)
WBC: 13.5 10*3/uL — ABNORMAL HIGH (ref 4.0–10.5)

## 2016-05-18 LAB — GLUCOSE, CAPILLARY
Glucose-Capillary: 111 mg/dL — ABNORMAL HIGH (ref 65–99)
Glucose-Capillary: 113 mg/dL — ABNORMAL HIGH (ref 65–99)
Glucose-Capillary: 116 mg/dL — ABNORMAL HIGH (ref 65–99)
Glucose-Capillary: 120 mg/dL — ABNORMAL HIGH (ref 65–99)
Glucose-Capillary: 123 mg/dL — ABNORMAL HIGH (ref 65–99)
Glucose-Capillary: 129 mg/dL — ABNORMAL HIGH (ref 65–99)
Glucose-Capillary: 120 mg/dL — ABNORMAL HIGH (ref 65–99)
Glucose-Capillary: 110 mg/dL — ABNORMAL HIGH (ref 65–99)
Glucose-Capillary: 119 mg/dL — ABNORMAL HIGH (ref 65–99)
Glucose-Capillary: 120 mg/dL — ABNORMAL HIGH (ref 65–99)
Glucose-Capillary: 123 mg/dL — ABNORMAL HIGH (ref 65–99)
Glucose-Capillary: 125 mg/dL — ABNORMAL HIGH (ref 65–99)
Glucose-Capillary: 125 mg/dL — ABNORMAL HIGH (ref 65–99)
Glucose-Capillary: 138 mg/dL — ABNORMAL HIGH (ref 65–99)
Glucose-Capillary: 140 mg/dL — ABNORMAL HIGH (ref 65–99)
Glucose-Capillary: 142 mg/dL — ABNORMAL HIGH (ref 65–99)
Glucose-Capillary: 147 mg/dL — ABNORMAL HIGH (ref 65–99)
Glucose-Capillary: 147 mg/dL — ABNORMAL HIGH (ref 65–99)
Glucose-Capillary: 155 mg/dL — ABNORMAL HIGH (ref 65–99)
Glucose-Capillary: 193 mg/dL — ABNORMAL HIGH (ref 65–99)

## 2016-05-18 LAB — CREATININE, SERUM
Creatinine, Ser: 1.24 mg/dL (ref 0.61–1.24)
GFR calc Af Amer: >60 mL/min (ref >60)
GFR calc non Af Amer: 56 mL/min — ABNORMAL LOW (ref >60)

## 2016-05-18 LAB — MAGNESIUM
Magnesium: 1.8 mg/dL (ref 1.7–2.4)
Magnesium: 1.9 mg/dL (ref 1.7–2.4)

## 2016-05-18 MED ORDER — ENOXAPARIN SODIUM 30 MG/0.3ML ~~LOC~~ SOLN
30.0000 mg | Freq: Every day | SUBCUTANEOUS | Status: DC
  Administered 2016-05-18 – 2016-05-22 (×5): 30 mg via SUBCUTANEOUS
  Filled 2016-05-18 (×5): qty 0.3

## 2016-05-18 MED ORDER — INSULIN DETEMIR 100 UNIT/ML ~~LOC~~ SOLN
20.0000 [IU] | Freq: Every day | SUBCUTANEOUS | Status: DC
  Administered 2016-05-19: 20 [IU] via SUBCUTANEOUS
  Filled 2016-05-18 (×4): qty 0.2

## 2016-05-18 MED ORDER — INSULIN DETEMIR 100 UNIT/ML ~~LOC~~ SOLN
20.0000 [IU] | Freq: Once | SUBCUTANEOUS | Status: AC
  Administered 2016-05-18: 20 [IU] via SUBCUTANEOUS
  Filled 2016-05-18: qty 0.2

## 2016-05-18 MED ORDER — INSULIN ASPART 100 UNIT/ML ~~LOC~~ SOLN
0.0000 [IU] | SUBCUTANEOUS | Status: DC
  Administered 2016-05-18: 4 [IU] via SUBCUTANEOUS
  Administered 2016-05-18: 2 [IU] via SUBCUTANEOUS
  Administered 2016-05-18: 4 [IU] via SUBCUTANEOUS
  Administered 2016-05-19: 2 [IU] via SUBCUTANEOUS
  Administered 2016-05-19: 4 [IU] via SUBCUTANEOUS

## 2016-05-18 MED FILL — Heparin Sodium (Porcine) Inj 1000 Unit/ML: INTRAMUSCULAR | Qty: 10 | Status: AC

## 2016-05-18 MED FILL — Electrolyte-R (PH 7.4) Solution: INTRAVENOUS | Qty: 4000 | Status: AC

## 2016-05-18 MED FILL — Sodium Chloride IV Soln 0.9%: INTRAVENOUS | Qty: 2000 | Status: AC

## 2016-05-18 MED FILL — Sodium Bicarbonate IV Soln 8.4%: INTRAVENOUS | Qty: 50 | Status: AC

## 2016-05-18 MED FILL — Lidocaine HCl IV Inj 20 MG/ML: INTRAVENOUS | Qty: 5 | Status: AC

## 2016-05-18 MED FILL — Mannitol IV Soln 20%: INTRAVENOUS | Qty: 500 | Status: AC

## 2016-05-18 NOTE — Op Note (Signed)
NAME:  Jack Wade, Jack Wade NO.:  [id number]  MEDICAL RECORD NO.:  [id number]  LOCATION:                                 FACILITY:  PHYSICIAN:  Lanelle Bal, MD    DATE OF BIRTH:  10-20-1941  DATE OF PROCEDURE:  05/17/2016 OPERATIVE REPORT POSTOPERATIVE DIAGNOSIS:  Coronary occlusive disease. POSTOPERATIVE DIAGNOSIS:  Coronary occlusive disease.  SURGICAL PROCEDURE:  Coronary artery bypass grafting x6 with left internal mammary to left anterior descending coronary artery, reverse saphenous vein graft to the 2nd diagonal, sequential reverse saphenous vein graft to the intermediate and distal circumflex, sequential reverse saphenous vein graft to the distal right coronary artery and mid posterior descending coronary artery with right leg greater saphenous endoscopic vein harvest, right thigh and calf. SURGEON:  Lanelle Bal, M.D.  FIRST ASSISTANT:  Lars Pinks, PA.  BRIEF HISTORY:  The patient is a 75 year old male, who presents with increasing anginal symptoms.  He underwent cardiac catheterization by Dr. Daneen Schick, which revealed severe three-vessel coronary artery disease including complex 80% LAD lesion, 90% 2nd diagonal, high-grade stenosis of at least 80%, a large intermediate coronary artery and disease in a small distal circumflex.  In addition, previous stents placed in the proximal right coronary artery in the mid stent area appeared to have at least complex 80% stenosis, though this was deemed insignificant by FR wire.  In addition, proximal 90% posterior descending lesions, overall ventricular function was preserved.  Because of the patient's ongoing symptoms and severe three-vessel coronary artery disease, coronary artery bypass grafting was recommended to the patient, who agreed and signed informed consent.  DESCRIPTION OF PROCEDURE:  With Swan-Ganz and arterial line monitors in place, the patient underwent general endotracheal anesthesia  without incident.  Skin of chest and legs was prepped with Betadine and draped in usual sterile manner.  Appropriate time-out was performed and we proceeded with right greater saphenous vein, endoscopic harvesting including the right thigh and calf.  The vein was of excellent quality and caliber.  Median sternotomy was performed.  Left internal mammary artery was dissected down as a pedicle graft, the distal artery was divided, had excellent free flow.  Pericardium was opened.  Overall, ventricular function was preserved.  The patient did have very mild dilatation of the ascending aorta measured grossly at approximately 3.8 cm.  TEE was done by Dr. Kalman Shan.  The patient had trace-to-mild aortic insufficiency.  The patient was systemically heparinized.  Ascending aorta was cannulated.  The right atrium was cannulated.  Aortic root vent cardioplegia needle was introduced into the ascending aorta.  The patient was placed on cardiopulmonary bypass 2.4 L/min/m2.  Sites of anastomosis were selected and dissected out of the epicardium.  The patient's body temperature was cooled to 32 degrees.  Aortic crossclamp was applied and 500 mL of cold blood potassium cardioplegia was administered in antegrade.  Attention was turned first to the distal right coronary artery, which was opened just at the takeoff of the posterior lateral branch.  A 1.5 mm probe passed distally.  Using a longitudinal side-to-side anastomosis with a running 7-0 Prolene, distal anastomosis was performed with 2nd reverse saphenous vein graft.  Distal extent of the same vein was then carried to the midportion of the posterior descending coronary artery, which was opened, admitted a 1 mm probe distally using a  running 7-0 Prolene, distal anastomosis was performed.  Additional cold blood cardioplegia was administered.  The heart was then elevated and attention was turned to the 1st intermediate coronary artery which was a good size  vessel, was opened, easily admitted a 1.5 mm probe distally.  Using a diamond-type, side-to-side anastomosis was carried out with a running 7-0 Prolene.  The distal extent of the same vein was then trimmed to the appropriate length.  The distal circumflex vessel which was relatively small was opened, admitted a 1 mm probe distally.  Using a running 8-0 Prolene, distal anastomosis was performed.  Attention was then turned to the 2nd diagonal coronary artery which was opened and admitted a 1.5 mm probe distally.  Using a running 7-0 Prolene, distal anastomosis was performed.  Attention was then turned to the left anterior descending coronary artery which the proximal 2/3rd of the vessel were intramyocardial, the distal 3rd was visible, the vessel was opened, admitted to a 1.5 mm probe distally. Using a running 8-0 Prolene, left internal mammary artery was anastomosed to the left anterior descending coronary artery.  With the cross-clamp still in place, three punch aortotomies were performed and each of the 3 vein grafts were anastomosed to the ascending aorta.  The bulldog was then removed from the mammary artery with prompt rise in myocardial septal temperature.Bulldogs were placed on the vein grafts.  The heart was allowed to fill and de-airing the proximal anastomoses were completed.  Aortic crossclamp was removed with total cross-clamp time of 112 minutes. Sites of anastomosis were inspected.  The distal anastomosis to the distal right coronary artery requires repair stitch.  The patient then was returned to a sinus rhythm spontaneously.  Atrial and ventricular pacing wires were applied with the body temperature rewarmed to 37 degrees.  The patient was atrially paced, increased rate was then ventilated and weaned from cardiopulmonary bypass without difficulty. He remained hemodynamically stable, was decannulated in usual fashion. Protamine sulfate was administered with operative field  hemostatic.  A left pleural tube and a Blake mediastinal drain were left in place.  The pericardium was loosely reapproximated.  Sternum was closed with #6 stainless steel wire.  Fascia was closed with interrupted 3-0 Vicryl and 3-0 Vicryl subcutaneous tissue, 4-0 subcuticular stitch in skin edges. Dry dressings were applied.  Sponge and needle count was reported as correct at completion of procedure.  The patient tolerated the procedure without obvious complication.  The sponge count was correct.  One single 8-0 Prolene needle was missing per protocols since these needles will not appear on x-ray, intraoperative x-ray was not obtained.  The pericardial sac had been inspected looking for the needle.  The patient tolerated the procedure without obvious complication, was transferred to the Surgical Intensive Care Unit for further postoperative care.     Lanelle Bal, MD   ______________________________ Lanelle Bal, MD    EG/MEDQ  D:  05/18/2016  T:  05/18/2016  Job:  [id number]  cc:   Denice Bors. Stanford Breed, MD, [facility]

## 2016-05-18 NOTE — Care Management Note (Signed)
Case Management Note  Patient Details  Name: Jack Wade MRN: [id number] Date of Birth: 10-13-1941  Subjective/Objective:  Pt is s/p CABG x 6                  Action/Plan:  PTA - Independent from home with wife.  Wife will provide 24/7 supervision post discharge.  CM will continue to monitor for disposition needs   Expected Discharge Date:                  Expected Discharge Plan:  [place]  In-House Referral:     Discharge planning Services  CM Consult  Post Acute Care Choice:    Choice offered to:     DME Arranged:    DME Agency:     HH Arranged:    HH Agency:     Status of Service:  In process, will continue to follow  Medicare Important Message Given:    Date Medicare IM Given:    Medicare IM give by:    Date Additional Medicare IM Given:    Additional Medicare Important Message give by:     If discussed at [place] of Stay Meetings, dates discussed:    Additional Comments:  Maryclare Labrador, RN 05/18/2016, 10:33 AM

## 2016-05-18 NOTE — Progress Notes (Signed)
Patient ID: Jack Wade, male   DOB: Jan 27, 1941, 75 y.o.   MRN: [id number] TCTS DAILY ICU PROGRESS NOTE                   [address]            [place] [address]          [phone]   1 Day Post-Op Procedure(s) (LRB): CORONARY ARTERY BYPASS GRAFTING (CABG) x 6 using left mammory artery and right greater saphenous vein harvested endoscopically. [place] to LAD, SVG to Diagonal, SVG Sequential to OM2 and ramus intermediate, SVG Sequential to RCA and PDA (N/A) TRANSESOPHAGEAL ECHOCARDIOGRAM (TEE) (N/A)  Total Length of Stay:  LOS: 1 day   Subjective: Alert and neuro intact  Objective: Vital signs in last 24 hours: Temp:  [96.3 F (35.7 C)-100.8 F (38.2 C)] 100 F (37.8 C) (05/24 0600) Pulse Rate:  [69-110] 75 (05/24 0600) Cardiac Rhythm:  [-]  Resp:  [14-26] 23 (05/24 0600) BP: (86-127)/(45-80) 97/51 mmHg (05/24 0600) SpO2:  [89 %-100 %] 89 % (05/24 0600) Arterial Line BP: (70-131)/(33-76) 117/48 mmHg (05/24 0600) FiO2 (%):  [40 %-50 %] 40 % (05/23 2054) Weight:  [226 lb 13.7 oz (102.9 kg)] 226 lb 13.7 oz (102.9 kg) (05/24 0500)  Filed Weights   05/17/16 0559 05/18/16 0500  Weight: 216 lb 7.9 oz (98.2 kg) 226 lb 13.7 oz (102.9 kg)    Weight change: 10 lb 5.8 oz (4.7 kg)   Hemodynamic parameters for last 24 hours: PAP: (19-35)/(5-25) 35/25 mmHg CO:  [2.3 L/min-5.1 L/min] 5.1 L/min CI:  [1 L/min/m2-2.3 L/min/m2] 2.3 L/min/m2  Intake/Output from previous day: 05/23 0701 - 05/24 0700 In: 8108.9 [I.V.:5410.9; Blood:1068; NG/GT:30; IV Piggyback:1600] Out: 5790 [Urine:3230; Blood:1200; Chest Tube:950]  Intake/Output this shift: Total I/O In: 1292.9 [I.V.:1012.9; NG/GT:30; IV Piggyback:250] Out: 1430 [Urine:1130; Chest Tube:300]  Current Meds: Scheduled Meds: . acetaminophen  1,000 mg Oral Q6H   Or  . acetaminophen (TYLENOL) oral liquid 160 mg/5 mL  1,000 mg Per Tube Q6H  . antiseptic oral rinse  7 mL Mouth Rinse QID  . aspirin EC  325 mg Oral  Daily   Or  . aspirin  324 mg Per Tube Daily  . bisacodyl  10 mg Oral Daily   Or  . bisacodyl  10 mg Rectal Daily  . cefUROXime (ZINACEF)  IV  1.5 g Intravenous Q12H  . chlorhexidine gluconate (SAGE KIT)  15 mL Mouth Rinse BID  . docusate sodium  200 mg Oral Daily  . famotidine (PEPCID) IV  20 mg Intravenous Q12H  . insulin regular  0-10 Units Intravenous TID WC  . levothyroxine  100 mcg Oral QAC breakfast  . metoprolol tartrate  12.5 mg Oral BID   Or  . metoprolol tartrate  12.5 mg Per Tube BID  . [START ON 05/19/2016] pantoprazole  40 mg Oral Daily  . rosuvastatin  40 mg Oral q1800  . sodium chloride flush  3 mL Intravenous Q12H   Continuous Infusions: . sodium chloride 20 mL/hr at 05/18/16 0500  . sodium chloride 250 mL (05/17/16 1559)  . sodium chloride 20 mL/hr at 05/18/16 0500  . dexmedetomidine 0.301 mcg/kg/hr (05/18/16 0500)  . insulin (NOVOLIN-R) infusion 2.6 Units/hr (05/18/16 0606)  . lactated ringers 20 mL/hr at 05/18/16 0500  . lactated ringers 20 mL/hr at 05/18/16 0500  . nitroGLYCERIN Stopped (05/17/16 1711)  . phenylephrine (NEO-SYNEPHRINE) Adult infusion 50 mcg/min (05/18/16 0530)   PRN Meds:.sodium chloride,  metoprolol, midazolam, morphine injection, ondansetron (ZOFRAN) IV, oxyCODONE, sodium chloride flush, traMADol  General appearance: alert, cooperative, appears stated age and no distress Neurologic: intact Heart: regular rate and rhythm, S1, S2 normal, no murmur, click, rub or gallop Lungs: diminished breath sounds bibasilar Abdomen: soft, non-tender; bowel sounds normal; no masses,  no organomegaly Extremities: extremities normal, atraumatic, no cyanosis or edema and Homans sign is negative, no sign of DVT Wound: sternum stable  Lab Results: CBC: Recent Labs  05/17/16 2030 05/17/16 2038 05/18/16 0405  WBC 15.7*  --  15.4*  HGB 8.3* 7.8* 8.6*  HCT 25.3* 23.0* 26.7*  PLT 124*  --  134*   BMET:  Recent Labs  05/16/16 0902  05/17/16 2038  05/18/16 0405  NA 135  < > 139 135  K 4.3  < > 4.3 4.3  CL 106  --  103 105  CO2 23  --   --  22  GLUCOSE 194*  < > 115* 128*  BUN 13  --  8 8  CREATININE 1.29*  < > 1.00 1.15  CALCIUM 9.7  --   --  8.2*  < > = values in this interval not displayed.  PT/INR:  Recent Labs  05/17/16 1450  LABPROT 19.2*  INR 1.61*   Radiology: Dg Chest Port 1 View  05/17/2016  CLINICAL DATA:  Status post coronary bypass grafting EXAM: PORTABLE CHEST 1 VIEW COMPARISON:  05/16/2016 FINDINGS: Cardiac shadow is within normal limits. An endotracheal tube, nasogastric catheter, left thoracostomy catheter, Swan-Ganz catheter and mediastinal drain are noted in satisfactory position. No pneumothorax is seen. Minimal bibasilar atelectasis is noted. IMPRESSION: Tubes and lines as described. Minimal bibasilar atelectasis. Electronically Signed   By: Inez Catalina M.D.   On: 05/17/2016 14:39     Assessment/Plan: S/P Procedure(s) (LRB): CORONARY ARTERY BYPASS GRAFTING (CABG) x 6 using left mammory artery and right greater saphenous vein harvested endoscopically. [place] to LAD, SVG to Diagonal, SVG Sequential to OM2 and ramus intermediate, SVG Sequential to RCA and PDA (N/A) TRANSESOPHAGEAL ECHOCARDIOGRAM (TEE) (N/A) Mobilize Diabetes control d/c tubes/lines Continue foley due to strict I&O, patient in ICU and urinary output monitoring See progression orders Expected Acute  Blood - loss Anemia Still on neo , weaning  Chest tube drainage decreased, will leave in longer    Jack Wade 05/18/2016 6:49 AM

## 2016-05-18 NOTE — Progress Notes (Signed)
POD # 1 CABG x 6  resting comfortably with no complaints  BP 103/50 mmHg  Pulse 76  Temp(Src) 98 F (36.7 C) (Oral)  Resp 27  Wt 226 lb 13.7 oz (102.9 kg)  SpO2 97%   Intake/Output Summary (Last 24 hours) at 05/18/16 1822 Last data filed at 05/18/16 1400  Gross per 24 hour  Intake 2270.25 ml  Output   1961 ml  Net 309.25 ml   K= 4.3, Hct= 23  Doing well POD # 1  Hamed Debella C. Roxan Hockey, MD [facility] [phone]

## 2016-05-19 ENCOUNTER — Inpatient Hospital Stay (HOSPITAL_COMMUNITY): Payer: Medicare Other

## 2016-05-19 LAB — BASIC METABOLIC PANEL
Anion gap: 7 (ref 5–15)
BUN: 12 mg/dL (ref 6–20)
CO2: 24 mmol/L (ref 22–32)
Calcium: 8.2 mg/dL — ABNORMAL LOW (ref 8.9–10.3)
Chloride: 103 mmol/L (ref 101–111)
Creatinine, Ser: 1.18 mg/dL (ref 0.61–1.24)
GFR calc Af Amer: >60 mL/min (ref >60)
GFR calc non Af Amer: 59 mL/min — ABNORMAL LOW (ref >60)
Glucose, Bld: 187 mg/dL — ABNORMAL HIGH (ref 65–99)
Potassium: 4.4 mmol/L (ref 3.5–5.1)
Sodium: 134 mmol/L — ABNORMAL LOW (ref 135–145)

## 2016-05-19 LAB — GLUCOSE, CAPILLARY
Glucose-Capillary: 132 mg/dL — ABNORMAL HIGH (ref 65–99)
Glucose-Capillary: 158 mg/dL — ABNORMAL HIGH (ref 65–99)
Glucose-Capillary: 180 mg/dL — ABNORMAL HIGH (ref 65–99)
Glucose-Capillary: 183 mg/dL — ABNORMAL HIGH (ref 65–99)
Glucose-Capillary: 183 mg/dL — ABNORMAL HIGH (ref 65–99)
Glucose-Capillary: 206 mg/dL — ABNORMAL HIGH (ref 65–99)

## 2016-05-19 LAB — CBC
HCT: 23.6 % — ABNORMAL LOW (ref 39.0–52.0)
Hemoglobin: 7.6 g/dL — ABNORMAL LOW (ref 13.0–17.0)
MCH: 27.2 pg (ref 26.0–34.0)
MCHC: 32.2 g/dL (ref 30.0–36.0)
MCV: 84.6 fL (ref 78.0–100.0)
Platelets: 113 10*3/uL — ABNORMAL LOW (ref 150–400)
RBC: 2.79 MIL/uL — ABNORMAL LOW (ref 4.22–5.81)
RDW: 13.6 % (ref 11.5–15.5)
WBC: 14.1 10*3/uL — ABNORMAL HIGH (ref 4.0–10.5)

## 2016-05-19 MED ORDER — BISACODYL 10 MG RE SUPP: 10.0000 mg | Freq: Every day | RECTAL | Status: DC | PRN

## 2016-05-19 MED ORDER — MOVING RIGHT ALONG BOOK
Freq: Once | Status: AC
  Administered 2016-05-19: 16:00:00
  Filled 2016-05-19: qty 1

## 2016-05-19 MED ORDER — DOCUSATE SODIUM 100 MG PO CAPS
200.0000 mg | ORAL_CAPSULE | Freq: Every day | ORAL | Status: DC
  Administered 2016-05-20 – 2016-05-23 (×4): 200 mg via ORAL
  Filled 2016-05-19 (×4): qty 2

## 2016-05-19 MED ORDER — SODIUM CHLORIDE 0.9% FLUSH
3.0000 mL | Freq: Two times a day (BID) | INTRAVENOUS | Status: DC
  Administered 2016-05-19 – 2016-05-22 (×7): 3 mL via INTRAVENOUS

## 2016-05-19 MED ORDER — ACETAMINOPHEN 325 MG PO TABS
650.0000 mg | ORAL_TABLET | Freq: Four times a day (QID) | ORAL | Status: DC | PRN
Start: 2016-05-19 — End: 2016-05-23

## 2016-05-19 MED ORDER — BISACODYL 5 MG PO TBEC: 10.0000 mg | DELAYED_RELEASE_TABLET | Freq: Every day | ORAL | Status: DC | PRN

## 2016-05-19 MED ORDER — SODIUM CHLORIDE 0.9% FLUSH: 3.0000 mL | INTRAVENOUS | Status: DC | PRN

## 2016-05-19 MED ORDER — FERROUS GLUCONATE 324 (38 FE) MG PO TABS
324.0000 mg | ORAL_TABLET | Freq: Every day | ORAL | Status: DC
  Administered 2016-05-19 – 2016-05-23 (×5): 324 mg via ORAL
  Filled 2016-05-19 (×5): qty 1

## 2016-05-19 MED ORDER — FOLIC ACID 1 MG PO TABS
1.0000 mg | ORAL_TABLET | Freq: Every day | ORAL | Status: DC
  Administered 2016-05-19 – 2016-05-23 (×5): 1 mg via ORAL
  Filled 2016-05-19 (×5): qty 1

## 2016-05-19 MED ORDER — INSULIN ASPART 100 UNIT/ML ~~LOC~~ SOLN
0.0000 [IU] | Freq: Three times a day (TID) | SUBCUTANEOUS | Status: DC
  Administered 2016-05-19: 2 [IU] via SUBCUTANEOUS
  Administered 2016-05-19: 8 [IU] via SUBCUTANEOUS
  Administered 2016-05-20 (×2): 4 [IU] via SUBCUTANEOUS
  Administered 2016-05-20 – 2016-05-21 (×3): 2 [IU] via SUBCUTANEOUS
  Administered 2016-05-21: 4 [IU] via SUBCUTANEOUS
  Administered 2016-05-21: 2 [IU] via SUBCUTANEOUS
  Administered 2016-05-21 – 2016-05-22 (×2): 4 [IU] via SUBCUTANEOUS
  Administered 2016-05-22: 2 [IU] via SUBCUTANEOUS
  Administered 2016-05-22: 4 [IU] via SUBCUTANEOUS
  Administered 2016-05-22 – 2016-05-23 (×2): 2 [IU] via SUBCUTANEOUS

## 2016-05-19 MED ORDER — SODIUM CHLORIDE 0.9 % IV SOLN: 250.0000 mL | INTRAVENOUS | Status: DC | PRN

## 2016-05-19 MED ORDER — ASPIRIN EC 81 MG PO TBEC
81.0000 mg | DELAYED_RELEASE_TABLET | Freq: Every day | ORAL | Status: DC
  Administered 2016-05-20 – 2016-05-23 (×4): 81 mg via ORAL
  Filled 2016-05-19 (×4): qty 1

## 2016-05-19 MED ORDER — ONDANSETRON HCL 4 MG/2ML IJ SOLN
4.0000 mg | Freq: Four times a day (QID) | INTRAMUSCULAR | Status: DC | PRN
  Administered 2016-05-19 – 2016-05-20 (×2): 4 mg via INTRAVENOUS
  Filled 2016-05-19 (×2): qty 2

## 2016-05-19 MED ORDER — PANTOPRAZOLE SODIUM 40 MG PO TBEC
40.0000 mg | DELAYED_RELEASE_TABLET | Freq: Every day | ORAL | Status: DC
  Administered 2016-05-20 – 2016-05-23 (×3): 40 mg via ORAL
  Filled 2016-05-19 (×4): qty 1

## 2016-05-19 MED ORDER — OXYCODONE HCL 5 MG PO TABS: 5.0000 mg | ORAL_TABLET | ORAL | Status: DC | PRN

## 2016-05-19 MED ORDER — METOPROLOL TARTRATE 12.5 MG HALF TABLET
12.5000 mg | ORAL_TABLET | Freq: Two times a day (BID) | ORAL | Status: DC
  Administered 2016-05-19 – 2016-05-23 (×7): 12.5 mg via ORAL
  Filled 2016-05-19 (×8): qty 1

## 2016-05-19 MED ORDER — TRAMADOL HCL 50 MG PO TABS
50.0000 mg | ORAL_TABLET | ORAL | Status: DC | PRN
  Administered 2016-05-21 – 2016-05-22 (×2): 100 mg via ORAL
  Filled 2016-05-19 (×2): qty 2

## 2016-05-19 MED ORDER — ONDANSETRON HCL 4 MG PO TABS: 4.0000 mg | ORAL_TABLET | Freq: Four times a day (QID) | ORAL | Status: DC | PRN

## 2016-05-19 NOTE — Care Management Important Message (Signed)
Important Message  Patient Details  Name: Jack Wade MRN: [id number] Date of Birth: [birth year]   Medicare Important Message Given:  Yes    Nathen May 05/19/2016, 11:33 AM

## 2016-05-19 NOTE — Progress Notes (Signed)
Patient ID: Jack Wade, male   DOB: 1941/10/27, 75 y.o.   MRN: [id number] TCTS DAILY ICU PROGRESS NOTE                   [address]            [place] [address]          [phone]   2 Days Post-Op Procedure(s) (LRB): CORONARY ARTERY BYPASS GRAFTING (CABG) x 6 using left mammory artery and right greater saphenous vein harvested endoscopically. [place] to LAD, SVG to Diagonal, SVG Sequential to OM2 and ramus intermediate, SVG Sequential to RCA and PDA (N/A) TRANSESOPHAGEAL ECHOCARDIOGRAM (TEE) (N/A)  Total Length of Stay:  LOS: 2 days   Subjective: Up to chair , feels well  Objective: Vital signs in last 24 hours: Temp:  [97.5 F (36.4 C)-99.9 F (37.7 C)] 97.8 F (36.6 C) (05/25 0400) Pulse Rate:  [68-87] 87 (05/25 0600) Cardiac Rhythm:  [-] Normal sinus rhythm (05/24 2000) Resp:  [24-31] 27 (05/24 1400) BP: (88-112)/(50-68) 111/61 mmHg (05/25 0600) SpO2:  [85 %-100 %] 100 % (05/25 0600) Arterial Line BP: (123-127)/(45-49) 123/45 mmHg (05/24 0900) Weight:  [225 lb 15.5 oz (102.5 kg)] 225 lb 15.5 oz (102.5 kg) (05/25 0500)  Filed Weights   05/17/16 0559 05/18/16 0500 05/19/16 0500  Weight: 216 lb 7.9 oz (98.2 kg) 226 lb 13.7 oz (102.9 kg) 225 lb 15.5 oz (102.5 kg)    Weight change: -14.1 oz (-0.4 kg)   Hemodynamic parameters for last 24 hours: PAP: (26-28)/(12-14) 28/13 mmHg CO:  [4.7 L/min] 4.7 L/min CI:  [2.2 L/min/m2] 2.2 L/min/m2  Intake/Output from previous day: 05/24 0701 - 05/25 0700 In: 1304.8 [P.O.:360; I.V.:844.8; IV Piggyback:100] Out: 941 [Urine:701; Chest Tube:240]  Intake/Output this shift: Total I/O In: 200 [I.V.:200] Out: 450 [Urine:360; Chest Tube:90]  Current Meds: Scheduled Meds: . acetaminophen  1,000 mg Oral Q6H   Or  . acetaminophen (TYLENOL) oral liquid 160 mg/5 mL  1,000 mg Per Tube Q6H  . aspirin EC  325 mg Oral Daily   Or  . aspirin  324 mg Per Tube Daily  . bisacodyl  10 mg Oral Daily   Or  . bisacodyl   10 mg Rectal Daily  . cefUROXime (ZINACEF)  IV  1.5 g Intravenous Q12H  . docusate sodium  200 mg Oral Daily  . enoxaparin (LOVENOX) injection  30 mg Subcutaneous QHS  . insulin aspart  0-24 Units Subcutaneous Q4H  . insulin detemir  20 Units Subcutaneous Daily  . levothyroxine  100 mcg Oral QAC breakfast  . metoprolol tartrate  12.5 mg Oral BID   Or  . metoprolol tartrate  12.5 mg Per Tube BID  . pantoprazole  40 mg Oral Daily  . rosuvastatin  40 mg Oral q1800  . sodium chloride flush  3 mL Intravenous Q12H   Continuous Infusions: . sodium chloride Stopped (05/18/16 1300)  . sodium chloride Stopped (05/18/16 1600)  . sodium chloride Stopped (05/18/16 0700)  . dexmedetomidine Stopped (05/18/16 1500)  . lactated ringers Stopped (05/18/16 0700)  . lactated ringers 20 mL/hr at 05/18/16 1900  . nitroGLYCERIN Stopped (05/17/16 1711)  . phenylephrine (NEO-SYNEPHRINE) Adult infusion Stopped (05/18/16 1400)   PRN Meds:.sodium chloride, metoprolol, midazolam, morphine injection, ondansetron (ZOFRAN) IV, oxyCODONE, sodium chloride flush, traMADol  General appearance: alert, cooperative and no distress Neurologic: intact Heart: regular rate and rhythm, S1, S2 normal, no murmur, click, rub or gallop Lungs: diminished breath sounds bibasilar  Abdomen: soft, non-tender; bowel sounds normal; no masses,  no organomegaly Extremities: extremities normal, atraumatic, no cyanosis or edema and Homans sign is negative, no sign of DVT Wound: sternum stable  Lab Results: CBC: Recent Labs  05/18/16 1646 05/19/16 0350  WBC 13.5* 14.1*  HGB 7.3* 7.6*  HCT 22.7* 23.6*  PLT 116* 113*   BMET:  Recent Labs  05/18/16 0405 05/18/16 1638 05/18/16 1646 05/19/16 0350  NA 135 136  --  134*  K 4.3 4.3  --  4.4  CL 105 100*  --  103  CO2 22  --   --  24  GLUCOSE 128* 161*  --  187*  BUN 8 12  --  12  CREATININE 1.15 1.20 1.24 1.18  CALCIUM 8.2*  --   --  8.2*    PT/INR:  Recent Labs   05/17/16 1450  LABPROT 19.2*  INR 1.61*   Radiology: Dg Chest Port 1 View  05/18/2016  CLINICAL DATA:  75 year old male status post CABG. Initial encounter. EXAM: PORTABLE CHEST 1 VIEW COMPARISON:  05/17/2016, 05/16/2016 FINDINGS: Portable AP semi upright view at 0617 hours. Extubated. Enteric tube removed. Stable right IJ Swan-Ganz catheter. Left chest tube remains in place but has been repositioned. No pneumothorax identified. Mediastinal drain remains in place. Stable lung volumes. No pulmonary edema or pleural effusion. Mild perihilar atelectasis. Stable cardiac size and mediastinal contours. Sequelae of CABG. IMPRESSION: 1. Extubated and enteric tube removed. Other lines and tubes remain in place. 2. Stable lung volumes. Mild perihilar atelectasis. No pneumothorax. Electronically Signed   By: Genevie Ann M.D.   On: 05/18/2016 07:53     Assessment/Plan: S/P Procedure(s) (LRB): CORONARY ARTERY BYPASS GRAFTING (CABG) x 6 using left mammory artery and right greater saphenous vein harvested endoscopically. [place] to LAD, SVG to Diagonal, SVG Sequential to OM2 and ramus intermediate, SVG Sequential to RCA and PDA (N/A) TRANSESOPHAGEAL ECHOCARDIOGRAM (TEE) (N/A) Mobilize Diuresis Diabetes control Plan for transfer to step-down: see transfer orders Expected Acute  Blood - loss Anemia Renal function atabler    [name] 05/19/2016 6:54 AM

## 2016-05-20 ENCOUNTER — Inpatient Hospital Stay (HOSPITAL_COMMUNITY): Payer: Medicare Other

## 2016-05-20 ENCOUNTER — Other Ambulatory Visit: Payer: Self-pay | Admitting: Family Medicine

## 2016-05-20 LAB — CBC
HCT: 23.7 % — ABNORMAL LOW (ref 39.0–52.0)
Hemoglobin: 7.8 g/dL — ABNORMAL LOW (ref 13.0–17.0)
MCH: 27.7 pg (ref 26.0–34.0)
MCHC: 32.9 g/dL (ref 30.0–36.0)
MCV: 84 fL (ref 78.0–100.0)
Platelets: 157 10*3/uL (ref 150–400)
RBC: 2.82 MIL/uL — ABNORMAL LOW (ref 4.22–5.81)
RDW: 13.4 % (ref 11.5–15.5)
WBC: 14.6 10*3/uL — ABNORMAL HIGH (ref 4.0–10.5)

## 2016-05-20 LAB — BASIC METABOLIC PANEL
Anion gap: 9 (ref 5–15)
BUN: 16 mg/dL (ref 6–20)
CO2: 21 mmol/L — ABNORMAL LOW (ref 22–32)
Calcium: 8.1 mg/dL — ABNORMAL LOW (ref 8.9–10.3)
Chloride: 103 mmol/L (ref 101–111)
Creatinine, Ser: 1.21 mg/dL (ref 0.61–1.24)
GFR calc Af Amer: >60 mL/min (ref >60)
GFR calc non Af Amer: 58 mL/min — ABNORMAL LOW (ref >60)
Glucose, Bld: 173 mg/dL — ABNORMAL HIGH (ref 65–99)
Potassium: 4.3 mmol/L (ref 3.5–5.1)
Sodium: 133 mmol/L — ABNORMAL LOW (ref 135–145)

## 2016-05-20 LAB — GLUCOSE, CAPILLARY
Glucose-Capillary: 170 mg/dL — ABNORMAL HIGH (ref 65–99)
Glucose-Capillary: 217 mg/dL — ABNORMAL HIGH (ref 65–99)
Glucose-Capillary: 166 mg/dL — ABNORMAL HIGH (ref 65–99)
Glucose-Capillary: 152 mg/dL — ABNORMAL HIGH (ref 65–99)
Glucose-Capillary: 145 mg/dL — ABNORMAL HIGH (ref 65–99)

## 2016-05-20 MED ORDER — POTASSIUM CHLORIDE CRYS ER 20 MEQ PO TBCR
20.0000 meq | EXTENDED_RELEASE_TABLET | Freq: Every day | ORAL | Status: DC
  Administered 2016-05-20: 20 meq via ORAL
  Filled 2016-05-20: qty 1

## 2016-05-20 MED ORDER — FUROSEMIDE 40 MG PO TABS
40.0000 mg | ORAL_TABLET | Freq: Every day | ORAL | Status: DC
  Administered 2016-05-20: 40 mg via ORAL
  Filled 2016-05-20: qty 1

## 2016-05-20 MED ORDER — GLIPIZIDE 5 MG PO TABS
5.0000 mg | ORAL_TABLET | Freq: Two times a day (BID) | ORAL | Status: DC
  Administered 2016-05-20 – 2016-05-23 (×7): 5 mg via ORAL
  Filled 2016-05-20 (×7): qty 1

## 2016-05-20 NOTE — Progress Notes (Signed)
Removed pacing wires at 1145  with no resistance or bleeding. BP is 125//71. Pt. Tolerated procedure well with no pain.

## 2016-05-20 NOTE — Progress Notes (Signed)
CARDIAC REHAB PHASE I   PRE:  Rate/Rhythm: 95 SR  BP:  Supine:   Sitting: 118/59  Standing: 80/54   SaO2: 92-93%RA  MODE:  Ambulation: 0 ft    Sitting 125/66  1405-1505 Was going to walk with pt but he was very orthostatic. Could hardly stand long enough for BP to be taken. C/o severe dizziness. Sat pt down and BP back to 125/66 from 80/54. Did not walk. Told pt to call and not get up by himself. Gave juice to drink and encourage fluids. Since family here, I completed ed. Lots of questions addressed. Encouraged IS and flutter valve since not walking. Discussed CRP 2 and will refer to [place]. Reviewed sternal precautions and helped pt stand appropriately. Reviewed carb counting and heart healthy diets. Wrote down how to view discharge video. Will follow up tomorrow. Graylon Good, RN BSN  05/20/2016 3:01 PM

## 2016-05-20 NOTE — Progress Notes (Addendum)
      [address]       [place] [address]             [phone]        3 Days Post-Op Procedure(s) (LRB): CORONARY ARTERY BYPASS GRAFTING (CABG) x 6 using left mammory artery and right greater saphenous vein harvested endoscopically. [place] to LAD, SVG to Diagonal, SVG Sequential to OM2 and ramus intermediate, SVG Sequential to RCA and PDA (N/A) TRANSESOPHAGEAL ECHOCARDIOGRAM (TEE) (N/A)  Subjective: Patient with dizziness upon sitting up or standing. Also, has nausea with this.  Objective: Vital signs in last 24 hours: Temp:  [97.7 F (36.5 C)-99.2 F (37.3 C)] 99.2 F (37.3 C) (05/26 0333) Pulse Rate:  [83-102] 91 (05/26 0333) Cardiac Rhythm:  [-] Normal sinus rhythm (05/25 1940) Resp:  [16-18] 18 (05/26 0333) BP: (105-132)/(51-67) 107/60 mmHg (05/26 0333) SpO2:  [93 %-98 %] 93 % (05/26 0333) Weight:  [225 lb 11.2 oz (102.377 kg)] 225 lb 11.2 oz (102.377 kg) (05/26 0333)  Pre op weight 98 kg Current Weight  05/20/16 225 lb 11.2 oz (102.377 kg)      Intake/Output from previous day: 05/25 0701 - 05/26 0700 In: 360 [P.O.:360] Out: 750 [Urine:750]   Physical Exam:  Cardiovascular: RRR Pulmonary: Diminished at bases Abdomen: Soft, non tender, bowel sounds present. Extremities: Mild bilateral lower extremity edema. Ecchymosis right thigh. Wounds: RLE wounds are clean and dry. Aquacel intact.  Lab Results: CBC: Recent Labs  05/19/16 0350 05/20/16 0317  WBC 14.1* 14.6*  HGB 7.6* 7.8*  HCT 23.6* 23.7*  PLT 113* 157   BMET:  Recent Labs  05/19/16 0350 05/20/16 0317  NA 134* 133*  K 4.4 4.3  CL 103 103  CO2 24 21*  GLUCOSE 187* 173*  BUN 12 16  CREATININE 1.18 1.21  CALCIUM 8.2* 8.1*    PT/INR:  Lab Results  Component Value Date   INR 1.61* 05/17/2016   INR 1.11 05/16/2016   INR 0.99 05/09/2016   ABG:  INR: Will add last result for INR, ABG once components are confirmed Will add last 4 CBG results once components are  confirmed  Assessment/Plan:  1. CV - SR in the 90's. On Lopressor 12.5 mg bid. 2.  Pulmonary - On room air. CXR this am shows stable cardiomegaly, low lung volumes, mild bibasilar atelectasis, small bilateral pleural effusions, and no pneumothorax. Encourage incentive spirometer and flutter valve. 3. Volume Overload - Start Lasix 40 mg daily 4.  Acute blood loss anemia - H and H stable at 7.8 and 23.7. Continue Fergon. 5. DM-CBGs 206/158/170. On Insulin only. Will restart oral Glipizide and stop Insulin. Will restart Metformin if creatinine is stable in am. Pre op HGA1C 7.7. 6. Thrombocytopenia resolved-platelets up to 157,000 7. Regarding dizziness and nausea, will check orthostatic 8. Remove EPW  ZIMMERMAN,DONIELLE MPA-C 05/20/2016,7:34 AM  Up in chair , stable ,HGB remains stable ,avoid transfusion Poss DC Sunday or Monday I have seen and examined [name] and agree with the above assessment  and plan.  [name] MD [facility] [phone] Office [phone] 05/20/2016 10:58 AM

## 2016-05-20 NOTE — Progress Notes (Signed)
Utilization review completed.  

## 2016-05-20 NOTE — Discharge Instructions (Signed)
Coronary Artery Bypass Grafting, Care After °Refer to this sheet in the next few weeks. These instructions provide you with information on caring for yourself after your procedure. Your health care provider may also give you more specific instructions. Your treatment has been planned according to current medical practices, but problems sometimes occur. Call your health care provider if you have any problems or questions after your procedure. °WHAT TO EXPECT AFTER THE PROCEDURE °Recovery from surgery will be different for everyone. Some people feel well after 3 or 4 weeks, while for others it takes longer. After your procedure, it is typical to have the following: °· Nausea and a lack of appetite.   °· Constipation. °· Weakness and fatigue.   °· Depression or irritability.   °· Pain or discomfort at your incision site. °HOME CARE INSTRUCTIONS °· Take medicines only as directed by your health care provider. Do not stop taking medicines or start any new medicines without first checking with your health care provider. °· Take your pulse as directed by your health care provider. °· Perform deep breathing as directed by your health care provider. If you were given a device called an incentive spirometer, use it to practice deep breathing several times a day. Support your chest with a pillow or your arms when you take deep breaths or cough. °· Keep incision areas clean, dry, and protected. Remove or change any bandages (dressings) only as directed by your health care provider. You may have skin adhesive strips over the incision areas. Do not take the strips off. They will fall off on their own. °· Check incision areas daily for any swelling, redness, or drainage. °· If incisions were made in your legs, do the following: °¨ Avoid crossing your legs.   °¨ Avoid sitting for long periods of time. Change positions every 30 minutes.   °¨ Elevate your legs when you are sitting. °· Wear compression stockings as directed by your  health care provider. These stockings help keep blood clots from forming in your legs. °· Take showers once your health care provider approves. Until then, only take sponge baths. Pat incisions dry. Do not rub incisions with a washcloth or towel. Do not take baths, swim, or use a hot tub until your health care provider approves. °· Eat foods that are high in fiber, such as raw fruits and vegetables, whole grains, beans, and nuts. Meats should be lean cut. Avoid canned, processed, and fried foods. °· Drink enough fluid to keep your urine clear or pale yellow. °· Weigh yourself every day. This helps identify if you are retaining fluid that may make your heart and lungs work harder. °· Rest and limit activity as directed by your health care provider. You may be instructed to: °¨ Stop any activity at once if you have chest pain, shortness of breath, irregular heartbeats, or dizziness. Get help right away if you have any of these symptoms. °¨ Move around frequently for short periods or take short walks as directed by your health care provider. Increase your activities gradually. You may need physical therapy or cardiac rehabilitation to help strengthen your muscles and build your endurance. °¨ Avoid lifting, pushing, or pulling anything heavier than 10 lb (4.5 kg) for at least 6 weeks after surgery. °· Do not drive until your health care provider approves.  °· Ask your health care provider when you may return to work. °· Ask your health care provider when you may resume sexual activity. °· Keep all follow-up visits as directed by your health care   provider. This is important. °SEEK MEDICAL CARE IF: °· You have swelling, redness, increasing pain, or drainage at the site of an incision. °· You have a fever. °· You have swelling in your ankles or legs. °· You have pain in your legs.   °· You gain 2 or more pounds (0.9 kg) a day. °· You are nauseous or vomit. °· You have diarrhea.  °SEEK IMMEDIATE MEDICAL CARE IF: °· You have  chest pain that goes to your jaw or arms. °· You have shortness of breath.   °· You have a fast or irregular heartbeat.   °· You notice a "clicking" in your breastbone (sternum) when you move.   °· You have numbness or weakness in your arms or legs. °· You feel dizzy or light-headed.   °MAKE SURE YOU: °· Understand these instructions. °· Will watch your condition. °· Will get help right away if you are not doing well or get worse. °  °This information is not intended to replace advice given to you by your health care provider. Make sure you discuss any questions you have with your health care provider. °  °Document Released: 07/01/2005 Document Revised: 01/02/2015 Document Reviewed: 05/21/2013 °Elsevier Interactive Patient Education ©2016 [employer] ° °

## 2016-05-20 NOTE — Discharge Summary (Signed)
Physician Discharge Summary       [address]       [place] [address]             [phone]    Patient ID: Jack Wade MRN: [id number] DOB/AGE: [birth year] 75 y.o.  Admit date: 05/17/2016 Discharge date: 05/23/2016  Admission Diagnoses: 1. CAD (coronary artery disease, s/p PCI stents) Discharge Diagnoses:  1. Hyperlipidemia 2. Hypertension 3. Diabetes type 2, controlled ([facility]) 4. Melanoma ([facility])of the scalp 5. Hypothyroidism 6. Dysphagia, unspecified 7. Back pain 8. Hereditary and idiopathic peripheral neuropathy 9. Tobacco abuse 10. ABL anemia  Procedure (s):  Intravascular Pressure Wire/FFR Study    Left Heart Cath and Coronary Angiography by Dr. Tamala Julian on 05/11/2016:    Conclusion    1. Prox RCA to Mid RCA lesion, 60% stenosed. The lesion was previously treated with a stent (unknown type). 2. Ramus lesion, 99% stenosed. 3. LM lesion, 50% stenosed. 4. Mid LAD lesion, 85% stenosed. 5. Ost 2nd Diag lesion, 70% stenosed. 6. Prox Cx to Dist Cx lesion, 80% stenosed. 7. 2nd Mrg lesion, 80% stenosed. 8. RPDA lesion, 75% stenosed. 9. Ost LM to LM lesion, 40% stenosed.   Severe, calcific, three-vessel coronary disease with moderate distal left main, severe proximal to mid LAD disease, severe mid ramus intermedius, severe segmental circumflex, and severe obstruction in the PDA with hemodynamically insignificant obstruction in the previously stented proximal to mid RCA.  Normal left ventricular function with normal hemodynamics.    Coronary artery bypass grafting x6 with left internal mammary to left anterior descending coronary artery, reverse saphenous vein graft to the 2nd diagonal, sequential reverse saphenous vein graft to the intermediate and distal circumflex, sequential reverse  saphenous vein graft to the distal right coronary artery and mid posterior descending coronary artery with right leg greater saphenous endoscopic vein  harvest, right thigh and calf by Dr. Servando Snare 05/17/2016.  History of Presenting Illness: Jack Wade 75 y.o. male is seen in the office consideration of coronary artery bypass grafting. The patient has a known history of coronary occlusive disease having had 2 stents placed in the proximal right coronary artery in June 1998. He had cardiac catheterization done yesterday because of increasing symptoms of substernal chest pain while walking with neighbors in the neighborhood and also while cutting his grass. He notes his symptoms have been increasing recently. Because of these symptoms he had a nuclear stress test with mild inferior lateral and apical lateral wall mild ischemia ejection fraction 58%. He notes the symptoms are very similar to the discomfort that he had prior to his stent placement in 1998. The patient is a known diabetic on metformin and Glucotrol. He has had no known myocardial infarction. The goals risks and alternatives of the planned surgical procedure CABG have been discussed with the patient in detail. The risks of the procedure including death, infection, stroke, myocardial infarction, bleeding, blood transfusion have all been discussed specifically. Dr. Servando Snare quoted Jack Wade a 2 % of perioperative mortality and a complication rate as high as 40 %. The patient's questions have been answered.Jack Wade is willing to proceed with the planned procedure. Pre operative carotid duplex US showed no significant internal carotid artery stenosis bilaterally. He underwent a CABG x 6 on 05/17/2016.  Brief Hospital Course:  The patient was extubated the evening of surgery without difficulty. He remained afebrile and hemodynamically stable. He was weaned off of Neo Synephrine drip. Gordy Councilman, a line, chest tubes, and  foley were removed early in the post operative course. Lopressor was started and titrated accordingly. He was volume over loaded and diuresed. He had ABL  anemia. He did not require a post op transfusion. He was put on folic acid and Fergon. His last H and H was 7.8 and 23.7. He had mild thrombocytopenia, which resolved prior to surgery. He was weaned off the insulin drip. The patient's HGA1C pre op was 7.7. Once he was tolerating a diet, Glipizde was restarted.  Because his creatine increased to 1.4, he was not restarted on Metformin post op. His last creatinine was down to 1.39 on 05/29. Metformin will be restarted at discharge. The patient's glucose remained well controlled. The patient was felt surgically stable for transfer from the ICU to [facility] for further convalescence on 05/19/2016. He continues to progress with cardiac rehab. He was ambulating on room air. He has been tolerating a diet and has had a bowel movement. He did have dizziness and nausea post op. This was felt related to ortho stasis and anemia. These symptoms did resolve.  Epicardial pacing wires were removed on 05/27. Chest tube sutures will be removed today.The patient is felt surgically stable for discharge today.  Latest Vital Signs: Blood pressure 115/62, pulse 78, temperature 98 F (36.7 C), temperature source Oral, resp. rate 18, weight 220 lb 11.2 oz (100.109 kg), SpO2 96 %.  Physical Exam: Cardiovascular: RRR Pulmonary: Diminished at bases Abdomen: Soft, non tender, bowel sounds present. Extremities: Mild bilateral lower extremity edema. Ecchymosis right thigh. Wounds: RLE wounds are clean and dry. Sternal wound is clean and dry.  Discharge Condition:Stable and discharged to home.  Recent laboratory studies:  Lab Results  Component Value Date   WBC 14.6* 05/20/2016   HGB 7.8* 05/20/2016   HCT 23.7* 05/20/2016   MCV 84.0 05/20/2016   PLT 157 05/20/2016   Lab Results  Component Value Date   NA 135 05/23/2016   K 4.1 05/23/2016   CL 103 05/23/2016   CO2 26 05/23/2016   CREATININE 1.39* 05/23/2016   GLUCOSE 131* 05/23/2016    Diagnostic Studies: Dg Chest 2  View  05/20/2016  CLINICAL DATA:  CABG. EXAM: CHEST  2 VIEW COMPARISON:  05/19/2016. FINDINGS: Interval removal of right IJ sheath, left chest tube, mediastinal drainage catheter. Prior CABG. Cardiomegaly with normal pulmonary vascularity. Low lung volumes with mild bibasilar atelectasis. Small bilateral pleural effusions. No pneumothorax. IMPRESSION: 1. Interval removal of right IJ sheath, left chest tube, mediastinal drainage catheter. No pneumothorax. 2. Prior CABG.  Stable cardiomegaly. 3. Low lung volumes with bibasilar atelectasis and/or infiltrates/mild edema. Small bilateral pleural effusions. Electronically Signed   By: Marcello Moores  Register   On: 05/20/2016 07:12       Discharge Instructions    Amb Referral to Cardiac Rehabilitation    Complete by:  As directed   Diagnosis:  CABG  CABG X ___:  6          Discharge Medications:   Medication List    TAKE these medications        acetaminophen 325 MG tablet  Commonly known as:  TYLENOL  Take 2 tablets (650 mg total) by mouth every 6 (six) hours as needed for mild pain.     aspirin 81 MG tablet  Take 81 mg by mouth daily.     ferrous gluconate 324 MG tablet  Commonly known as:  FERGON  Take 1 tablet (324 mg total) by mouth daily with breakfast. For one month then  stop.     folic acid 1 MG tablet  Commonly known as:  FOLVITE  Take 1 tablet (1 mg total) by mouth daily. For one month then stop.     furosemide 40 MG tablet  Commonly known as:  LASIX  Take 1 tablet (40 mg total) by mouth daily. For 4 days then stop.     glipiZIDE 5 MG tablet  Commonly known as:  GLUCOTROL  Take 2 tablets (10 mg total) by mouth 2 (two) times daily before a meal.     levothyroxine 100 MCG tablet  Commonly known as:  SYNTHROID, LEVOTHROID  TAKE 1 TABLET (100 MCG TOTAL) BY MOUTH DAILY BEFORE BREAKFAST.     metFORMIN 500 MG 24 hr tablet  Commonly known as:  GLUCOPHAGE-XR  Take 2 tablets (1,000 mg total) by mouth 2 (two) times daily.  Start  taking on:  05/25/2016     metoprolol tartrate 25 MG tablet  Commonly known as:  LOPRESSOR  Take 0.5 tablets (12.5 mg total) by mouth 2 (two) times daily.     potassium chloride SA 20 MEQ tablet  Commonly known as:  K-DUR,KLOR-CON  Take 1 tablet (20 mEq total) by mouth daily. For 4 days then stop.     rosuvastatin 40 MG tablet  Commonly known as:  CRESTOR  Take 1 tablet (40 mg total) by mouth daily.     traMADol 50 MG tablet  Commonly known as:  ULTRAM  Take 1 tablet (50 mg total) by mouth every 4 (four) hours as needed for moderate pain.       The patient has been discharged on:   1.Beta Blocker:  Yes [ x  ]                              No   [   ]                              If No, reason:  2.Ace Inhibitor/ARB: Yes [   ]                                     No  [  x  ]                                     If No, reason: Labile BP  3.Statin:   Yes [  x ]                  No  [   ]                  If No, reason:  4.Ecasa:  Yes  [ x  ]                  No   [   ]                  If No, reason:  Follow Up Appointments: Follow-up Information    Follow up with Sinclair Grooms, MD.   Specialty:  Cardiology   Why:  Call for a follow up appointment for 2 weeks   Contact information:   [id number]  [address] [place] [address] [phone]       Follow up with [name], MD On 06/23/2016.   Specialty:  Cardiothoracic Surgery   Why:  PA/LA CXR to be taken (at [facility] which is in the same building as Dr. Everrett Coombe office) on 06/23/2016 at 10:15 am;Appointment time is at 11:00 am   Contact information:   [place] [address] [phone]       Follow up with Penni Homans, MD.   Specialty:  Family Medicine   Why:  Call for a follow up appointment regarding further diabetes management and surveillance of HGA1C 7.7   Contact information:   [address] [place] [address] [phone]        Signed: Lars Pinks MPA-C 05/23/2016, 8:35 AM

## 2016-05-21 LAB — GLUCOSE, CAPILLARY
Glucose-Capillary: 153 mg/dL — ABNORMAL HIGH (ref 65–99)
Glucose-Capillary: 161 mg/dL — ABNORMAL HIGH (ref 65–99)
Glucose-Capillary: 179 mg/dL — ABNORMAL HIGH (ref 65–99)
Glucose-Capillary: 149 mg/dL — ABNORMAL HIGH (ref 65–99)

## 2016-05-21 LAB — BASIC METABOLIC PANEL
Anion gap: 9 (ref 5–15)
BUN: 22 mg/dL — ABNORMAL HIGH (ref 6–20)
CO2: 25 mmol/L (ref 22–32)
Calcium: 8.3 mg/dL — ABNORMAL LOW (ref 8.9–10.3)
Chloride: 100 mmol/L — ABNORMAL LOW (ref 101–111)
Creatinine, Ser: 1.41 mg/dL — ABNORMAL HIGH (ref 0.61–1.24)
GFR calc Af Amer: 56 mL/min — ABNORMAL LOW (ref >60)
GFR calc non Af Amer: 48 mL/min — ABNORMAL LOW (ref >60)
Glucose, Bld: 157 mg/dL — ABNORMAL HIGH (ref 65–99)
Potassium: 3.9 mmol/L (ref 3.5–5.1)
Sodium: 134 mmol/L — ABNORMAL LOW (ref 135–145)

## 2016-05-21 MED ORDER — POTASSIUM CHLORIDE CRYS ER 20 MEQ PO TBCR
20.0000 meq | EXTENDED_RELEASE_TABLET | Freq: Once | ORAL | Status: AC
  Administered 2016-05-21: 20 meq via ORAL
  Filled 2016-05-21: qty 1

## 2016-05-21 MED ORDER — LACTULOSE 10 GM/15ML PO SOLN
20.0000 g | Freq: Once | ORAL | Status: AC
  Administered 2016-05-21: 20 g via ORAL
  Filled 2016-05-21: qty 30

## 2016-05-21 NOTE — Progress Notes (Addendum)
      [address]       [place] [address]             [phone]        4 Days Post-Op Procedure(s) (LRB): CORONARY ARTERY BYPASS GRAFTING (CABG) x 6 using left mammory artery and right greater saphenous vein harvested endoscopically. Lima to LAD, SVG to Diagonal, SVG Sequential to OM2 and ramus intermediate, SVG Sequential to RCA and PDA (N/A) TRANSESOPHAGEAL ECHOCARDIOGRAM (TEE) (N/A)  Subjective: Patient still with dizziness and oc casional nausea.Has complaints of constipation this am.  Objective: Vital signs in last 24 hours: Temp:  [98.2 F (36.8 C)-99.6 F (37.6 C)] 99.6 F (37.6 C) (05/27 0434) Pulse Rate:  [62-117] 89 (05/27 0434) Cardiac Rhythm:  [-] Normal sinus rhythm (05/26 1930) Resp:  [18-20] 20 (05/27 0434) BP: (108-128)/(58-71) 118/58 mmHg (05/27 0434) SpO2:  [91 %-95 %] 95 % (05/27 0434) Weight:  [223 lb 12.3 oz (101.5 kg)] 223 lb 12.3 oz (101.5 kg) (05/27 0434)  Pre op weight 98 kg Current Weight  05/21/16 223 lb 12.3 oz (101.5 kg)      Intake/Output from previous day: 05/26 0701 - 05/27 0700 In: -  Out: 850 [Urine:850]   Physical Exam:  Cardiovascular: RRR Pulmonary: Diminished at bases Abdomen: Soft, non tender, bowel sounds present. Extremities: Mild bilateral lower extremity edema. Ecchymosis right thigh. Wounds: All wounds are clean and dry.  Lab Results: CBC:  Recent Labs  05/19/16 0350 05/20/16 0317  WBC 14.1* 14.6*  HGB 7.6* 7.8*  HCT 23.6* 23.7*  PLT 113* 157   BMET:   Recent Labs  05/20/16 0317 05/21/16 0557  NA 133* 134*  K 4.3 3.9  CL 103 100*  CO2 21* 25  GLUCOSE 173* 157*  BUN 16 22*  CREATININE 1.21 1.41*  CALCIUM 8.1* 8.3*    PT/INR:  Lab Results  Component Value Date   INR 1.61* 05/17/2016   INR 1.11 05/16/2016   INR 0.99 05/09/2016   ABG:  INR: Will add last result for INR, ABG once components are confirmed Will add last 4 CBG results once components are  confirmed  Assessment/Plan:  1. CV - SR in the 90's. On Lopressor 12.5 mg bid. 2.  Pulmonary - On room air. Encourage incentive spirometer and flutter valve. 3. Volume Overload - Continue Lasix 40 mg daily. Will hold this am as creatinine increased. 4.  Acute blood loss anemia - H and H stable at 7.8 and 23.7. Continue Fergon. 5. DM-CBGs 152/145/153. On oral Glipizide. Pre op HGA1C 7.7. Will not restart Metformin yet as creatinine increased. 6. Creatinine slightly increased from 1.21 to 1.41. Given Lasix yesterday and Glipizide for DM.  7. Supplement potassium 8. LOC constipation 9. Dizziness may be related to anemia and or orthostasis. Last Hct stable at 23.7. Would like to avoid transfusion. 10. Possibly home in 1-2 days.  ZIMMERMAN,DONIELLE MPA-C 05/21/2016,8:38 AM   Patient seen and examined, agree with above Able to ambulate 250' today. Less orthostatic but agree with holding lasix today  Remo Lipps C. Roxan Hockey, MD [facility] [phone]

## 2016-05-21 NOTE — Progress Notes (Signed)
CARDIAC REHAB PHASE I   PRE:  Rate/Rhythm: 89 SR PACs  BP:  Supine:   Sitting: 130/77  Standing: 135/62   SaO2: would not register  MODE:  Ambulation: 250 ft   POST:  Rate/Rhythm: 103 ST  BP:  Supine: 143/76  Sitting:   Standing:    SaO2: 97% RA ear 1000-1038 Pt walked 250 ft on RA with gait belt use and rolling walker. Denied dizziness but with generalized weakness and I noticed some SOB. Sats are hard to get as pt stated he has Raynauds. Had to use ear to register. To bathroom and no success with BM. Pt requested to go to bed. Assisted to bed. Encouraged walks with staff.   Graylon Good, RN BSN  05/21/2016 10:33 AM

## 2016-05-22 LAB — GLUCOSE, CAPILLARY
Glucose-Capillary: 134 mg/dL — ABNORMAL HIGH (ref 65–99)
Glucose-Capillary: 200 mg/dL — ABNORMAL HIGH (ref 65–99)
Glucose-Capillary: 145 mg/dL — ABNORMAL HIGH (ref 65–99)
Glucose-Capillary: 166 mg/dL — ABNORMAL HIGH (ref 65–99)

## 2016-05-22 NOTE — Progress Notes (Addendum)
      [address]       [place] [address]             [phone]        5 Days Post-Op Procedure(s) (LRB): CORONARY ARTERY BYPASS GRAFTING (CABG) x 6 using left mammory artery and right greater saphenous vein harvested endoscopically. [place]ima to LAD, SVG to Diagonal, SVG Sequential to OM2 and ramus intermediate, SVG Sequential to RCA and PDA (N/A) TRANSESOPHAGEAL ECHOCARDIOGRAM (TEE) (N/A)  Subjective: Patient with less dizziness and nausea. Ambulated 250 feet yesterday. No specific complaints this am.  Objective: Vital signs in last 24 hours: Temp:  [97.7 F (36.5 C)-98.6 F (37 C)] 97.7 F (36.5 C) (05/28 0459) Pulse Rate:  [80-88] 80 (05/28 0459) Cardiac Rhythm:  [-] Normal sinus rhythm;Bundle branch block (05/27 1900) Resp:  [19-24] 24 (05/28 0459) BP: (92-111)/(52-65) 104/52 mmHg (05/28 0459) SpO2:  [95 %-97 %] 95 % (05/28 0459) Weight:  [220 lb 4.8 oz (99.927 kg)] 220 lb 4.8 oz (99.927 kg) (05/28 0459)  Pre op weight 98 kg Current Weight  05/22/16 220 lb 4.8 oz (99.927 kg)      Intake/Output from previous day: 05/27 0701 - 05/28 0700 In: 480 [P.O.:480] Out: 700 [Urine:700]   Physical Exam:  Cardiovascular: RRR Pulmonary: Mostly clear Abdomen: Soft, non tender, bowel sounds present. Extremities: Mild bilateral lower extremity edema. Ecchymosis right thigh. Wounds: All wounds are clean and dry.  Lab Results: CBC:  Recent Labs  05/20/16 0317  WBC 14.6*  HGB 7.8*  HCT 23.7*  PLT 157   BMET:   Recent Labs  05/20/16 0317 05/21/16 0557  NA 133* 134*  K 4.3 3.9  CL 103 100*  CO2 21* 25  GLUCOSE 173* 157*  BUN 16 22*  CREATININE 1.21 1.41*  CALCIUM 8.1* 8.3*    PT/INR:  Lab Results  Component Value Date   INR 1.61* 05/17/2016   INR 1.11 05/16/2016   INR 0.99 05/09/2016   ABG:  INR: Will add last result for INR, ABG once components are confirmed Will add last 4 CBG results once components are  confirmed  Assessment/Plan:  1. CV - SR in the 90's. On Lopressor 12.5 mg bid. 2.  Pulmonary - On room air. Encourage incentive spirometer and flutter valve. 3.  Acute blood loss anemia - H and H stable at 7.8 and 23.7. Continue Fergon. 4. DM-CBGs 179/149/134. On oral Glipizide. Pre op HGA1C 7.7. Will not restart Metformin yet as creatinine increased. 5. Likely home in am  ZIMMERMAN,DONIELLE MPA-C 05/22/2016,8:04 AM   Patient seen and examined, agree with above Feeling better today Possibly home tomorrow  Remo Lipps C. Roxan Hockey, MD [facility] [phone]

## 2016-05-23 LAB — BASIC METABOLIC PANEL
Anion gap: 6 (ref 5–15)
BUN: 22 mg/dL — ABNORMAL HIGH (ref 6–20)
CO2: 26 mmol/L (ref 22–32)
Calcium: 8.1 mg/dL — ABNORMAL LOW (ref 8.9–10.3)
Chloride: 103 mmol/L (ref 101–111)
Creatinine, Ser: 1.39 mg/dL — ABNORMAL HIGH (ref 0.61–1.24)
GFR calc Af Amer: 56 mL/min — ABNORMAL LOW (ref >60)
GFR calc non Af Amer: 49 mL/min — ABNORMAL LOW (ref >60)
Glucose, Bld: 131 mg/dL — ABNORMAL HIGH (ref 65–99)
Potassium: 4.1 mmol/L (ref 3.5–5.1)
Sodium: 135 mmol/L (ref 135–145)

## 2016-05-23 LAB — GLUCOSE, CAPILLARY
Glucose-Capillary: 130 mg/dL — ABNORMAL HIGH (ref 65–99)
Glucose-Capillary: 159 mg/dL — ABNORMAL HIGH (ref 65–99)

## 2016-05-23 MED ORDER — TRAMADOL HCL 50 MG PO TABS: 50.0000 mg | ORAL_TABLET | ORAL | Status: DC | PRN

## 2016-05-23 MED ORDER — FERROUS GLUCONATE 324 (38 FE) MG PO TABS: 324.0000 mg | ORAL_TABLET | Freq: Every day | ORAL | Status: DC

## 2016-05-23 MED ORDER — POTASSIUM CHLORIDE CRYS ER 20 MEQ PO TBCR
20.0000 meq | EXTENDED_RELEASE_TABLET | Freq: Every day | ORAL | Status: DC
  Administered 2016-05-23: 20 meq via ORAL
  Filled 2016-05-23: qty 1

## 2016-05-23 MED ORDER — FUROSEMIDE 40 MG PO TABS
40.0000 mg | ORAL_TABLET | Freq: Every day | ORAL | Status: DC
  Administered 2016-05-23: 40 mg via ORAL
  Filled 2016-05-23: qty 1

## 2016-05-23 MED ORDER — FOLIC ACID 1 MG PO TABS: 1.0000 mg | ORAL_TABLET | Freq: Every day | ORAL | Status: DC

## 2016-05-23 MED ORDER — FUROSEMIDE 40 MG PO TABS: 40.0000 mg | ORAL_TABLET | Freq: Every day | ORAL | Status: DC

## 2016-05-23 MED ORDER — POTASSIUM CHLORIDE CRYS ER 20 MEQ PO TBCR: 20.0000 meq | EXTENDED_RELEASE_TABLET | Freq: Every day | ORAL | Status: DC

## 2016-05-23 MED ORDER — METFORMIN HCL ER 500 MG PO TB24: 1000.0000 mg | ORAL_TABLET | Freq: Two times a day (BID) | ORAL | Status: DC

## 2016-05-23 MED ORDER — METOPROLOL TARTRATE 25 MG PO TABS: 12.5000 mg | ORAL_TABLET | Freq: Two times a day (BID) | ORAL | Status: DC

## 2016-05-23 MED ORDER — ACETAMINOPHEN 325 MG PO TABS: 650.0000 mg | ORAL_TABLET | Freq: Four times a day (QID) | ORAL | Status: DC | PRN

## 2016-05-23 NOTE — Progress Notes (Signed)
      [address]       [place] [address]             [phone]        6 Days Post-Op Procedure(s) (LRB): CORONARY ARTERY BYPASS GRAFTING (CABG) x 6 using left mammory artery and right greater saphenous vein harvested endoscopically. [place]ima to LAD, SVG to Diagonal, SVG Sequential to OM2 and ramus intermediate, SVG Sequential to RCA and PDA (N/A) TRANSESOPHAGEAL ECHOCARDIOGRAM (TEE) (N/A)  Subjective: Patient feeling better and wants to go home.  Objective: Vital signs in last 24 hours: Temp:  [97.6 F (36.4 C)-98 F (36.7 C)] 98 F (36.7 C) (05/29 0605) Pulse Rate:  [78-88] 78 (05/29 0605) Cardiac Rhythm:  [-] Normal sinus rhythm;Bundle branch block (05/28 1900) Resp:  [18-19] 18 (05/29 0605) BP: (87-119)/(53-63) 115/62 mmHg (05/29 0605) SpO2:  [96 %-98 %] 96 % (05/29 0605) Weight:  [220 lb 11.2 oz (100.109 kg)] 220 lb 11.2 oz (100.109 kg) (05/29 0500)  Pre op weight 98 kg Current Weight  05/23/16 220 lb 11.2 oz (100.109 kg)        Physical Exam:  Cardiovascular: RRR Pulmonary: Mostly clear Abdomen: Soft, non tender, bowel sounds present. Extremities: Mild bilateral lower extremity edema. Ecchymosis right thigh. Wounds: All wounds are clean and dry.  Lab Results: CBC: No results for input(s): WBC, HGB, HCT, PLT in the last 72 hours. BMET:   Recent Labs  05/21/16 0557 05/23/16 0211  NA 134* 135  K 3.9 4.1  CL 100* 103  CO2 25 26  GLUCOSE 157* 131*  BUN 22* 22*  CREATININE 1.41* 1.39*  CALCIUM 8.3* 8.1*    PT/INR:  Lab Results  Component Value Date   INR 1.61* 05/17/2016   INR 1.11 05/16/2016   INR 0.99 05/09/2016   ABG:  INR: Will add last result for INR, ABG once components are confirmed Will add last 4 CBG results once components are confirmed  Assessment/Plan:  1. CV - SR in the 90's. On Lopressor 12.5 mg bid. 2.  Pulmonary - On room air. Encourage incentive spirometer and flutter valve. 3.  Acute blood loss anemia -  Last H and H stable at 7.8 and 23.7. Continue Fergon. 4. DM-CBGs 179/149/134. On oral Glipizide. Pre op HGA1C 7.7. Will restart Metformin at discharge as creatinine starting to decrease. 5.Discharge  Jayleen Afonso MPA-C 05/23/2016,8:06 AM

## 2016-05-23 NOTE — Care Management Important Message (Signed)
Important Message  Patient Details  Name: Jack Wade MRN: [id number] Date of Birth: [birth year]   Medicare Important Message Given:  Yes    Sukhman Kocher P Magdalena Skilton 05/23/2016, 9:45 AM

## 2016-05-23 NOTE — Progress Notes (Signed)
Pt needs a RW. Contacted James at [facility] for DME.

## 2016-05-23 NOTE — Progress Notes (Signed)
CT sutures d/c'd per order and per protocol. Painted with tincture, and steri-strips applied. Pt educated to leave strips in place until they fall off on their own. Will continue to monitor.

## 2016-05-24 ENCOUNTER — Telehealth: Payer: Self-pay | Admitting: *Deleted

## 2016-05-24 NOTE — Telephone Encounter (Signed)
Understand they may not feel up to coming in, if they need me in next few weeks could meet them very early otherwise offer them an appt on 06/16/16 that day does not seem packed.

## 2016-05-24 NOTE — Telephone Encounter (Signed)
Spoke w/ pt's wife, who stated that pt is sleeping at time of call. States pt is too "wiped out" to get out of the house right now and declined to schedule hospital follow-up within 2 week time frame, and refuses to see any provider besides PCP, who has no openings in that time frame anyhow. States, "He had 6 bypasses, do you know what that means?" Offered multiple times to schedule appointment w/ other providers in our office and explained importance of following up w/ PCP after discharge from hospital for management of pt's chronic conditions, especially his diabetes. Pt's wife absolutely refused to schedule w/ another provider. She agreed that as pt recovers and is able to get out of the house, she will call the schedule follow-up appointment.

## 2016-05-25 NOTE — Telephone Encounter (Signed)
Only 15 minutes appts available on 06/16/16. I would typically schedule this type of appt in a 30 minute slot. Please advise if okay to schedule in 15 minute slot. Thank you.

## 2016-05-25 NOTE — Telephone Encounter (Signed)
OK to schedule in 15 min but then block another 15 minute nearby if possible

## 2016-05-26 ENCOUNTER — Telehealth: Payer: Self-pay | Admitting: *Deleted

## 2016-05-26 NOTE — Telephone Encounter (Signed)
Pt scheduled at 3:00 on 06/16/16 and nearby slot blocked as requested.

## 2016-05-26 NOTE — Telephone Encounter (Signed)
Jack Wade is s/p CABG X 6, 05/17/16, discharged 05/23/16. His wife called Thursday morning, 05/25/16,  to report a very small amount of sternal drainage. There was no redness, swelling or fever.  The drainage was light yellow in color. I told her to watch for increasing amount and s/s of infection. She called again near the close of business to report an increasing amount of the sternal drainage while he was on the commode. She wanted to know what to do.  I said her only option was to go to the ED at this time of the day and she agreed. Today I saw that he did not go to the ED, so I called the home and talked to his wife. She said the drainage had stopped, there was no redness, swelling, he was afebrile. She says he is very tired, but eating well and walking three times a day short distances. I asked her to keep [place] informed regarding the drainage and she agreed.

## 2016-05-27 ENCOUNTER — Encounter: Payer: Self-pay | Admitting: Cardiothoracic Surgery

## 2016-05-27 ENCOUNTER — Other Ambulatory Visit: Payer: Self-pay | Admitting: *Deleted

## 2016-05-27 ENCOUNTER — Other Ambulatory Visit: Payer: Self-pay | Admitting: Cardiothoracic Surgery

## 2016-05-27 ENCOUNTER — Ambulatory Visit (INDEPENDENT_AMBULATORY_CARE_PROVIDER_SITE_OTHER): Payer: Self-pay | Admitting: Cardiothoracic Surgery

## 2016-05-27 VITALS — BP 97/56 | HR 77 | Temp 97.0°F | Resp 16 | Ht 72.0 in | Wt 210.0 lb

## 2016-05-27 DIAGNOSIS — D649 Anemia, unspecified: Secondary | ICD-10-CM

## 2016-05-27 DIAGNOSIS — Z951 Presence of aortocoronary bypass graft: Secondary | ICD-10-CM

## 2016-05-27 DIAGNOSIS — Z3A01 Less than 8 weeks gestation of pregnancy: Secondary | ICD-10-CM

## 2016-05-27 DIAGNOSIS — I2510 Atherosclerotic heart disease of native coronary artery without angina pectoris: Secondary | ICD-10-CM

## 2016-05-27 LAB — CBC
HCT: 28.3 % — ABNORMAL LOW (ref 38.5–50.0)
Hemoglobin: 8.8 g/dL — ABNORMAL LOW (ref 13.2–17.1)
MCH: 26.7 pg — ABNORMAL LOW (ref 27.0–33.0)
MCHC: 31.1 g/dL — ABNORMAL LOW (ref 32.0–36.0)
MCV: 86 fL (ref 80.0–100.0)
MPV: 7.7 fL (ref 7.5–12.5)
Platelets: 493 10*3/uL — ABNORMAL HIGH (ref 140–400)
RBC: 3.29 MIL/uL — ABNORMAL LOW (ref 4.20–5.80)
RDW: 14.5 % (ref 11.0–15.0)
WBC: 11.6 10*3/uL — ABNORMAL HIGH (ref 3.8–10.8)

## 2016-05-27 NOTE — Progress Notes (Signed)
PCP is Penni Homans, MD Referring Provider is Belva Crome, MD  Chief Complaint  Patient presents with  . Routine Post Op    s/p CABG X 6... 05/17/16.Marland Kitchenassess sternal drainage    [id number] presents office for concern or drainage from his sternal incision. He also complains of general fatigue and dizziness. No angina. No fever. No abdominal pain or nausea. He has been constipated, attributes to his iron tablets  Hemoglobin at the time of discharge was 7.6. Predischarge chest x-ray was clear with mild atelectasis at bases.  Today the patient is afebrile, oxygen saturation 96% and without evidence of fluid overload There is trace drainage from the lower third of the sternal incision which is intermittent. There is no evidence of cellulitis, sternal instability or fluctuance. The patient was given some 4 x 4 gauze tape and Betadine swabs for local wound care. He was given a prescription for Keflex 500 mg by mouth 3 times a day The patient and wife were reassured that this is not a serious problem and that should heal with local wound care and a course of antibiotics should also help.   Past Medical History  Diagnosis Date  . Hyperlipidemia   . Hypertension   . CAD (coronary artery disease)     2 stents  . Diabetes type 2, controlled ([place])   . Chicken pox   . Measles   . Appendicitis   . Mumps as a child  . Melanoma ([place])     Scalp. 2012  . Hypothyroidism   . Thyroid disease     [place] Hyperthyroidism  . Dysphagia, unspecified(787.20) 03/20/2014  . Back pain 02/09/2015  . Aortic calcification ([place]) 02/09/2015  . Hereditary and idiopathic peripheral neuropathy 02/09/2015  . Medicare annual wellness visit, subsequent 06/14/2015  . Diabetes mellitus type 2 in obese ([place]) 04/11/2016    Past Surgical History  Procedure Laterality Date  . Appendectomy    . Coronary angioplasty with stent placement      2 stents  . Cardiac catheterization    . Back surgery      x2 lumbar  .  Rotator cuff repair Right   . Wisdom tooth extraction    . Tonsillectomy and adenoidectomy    . Skin cancer excision      melanoma on scalp  . Back surgery      late 1990s had 2 surgeries, first surgery lifted a heavy engine ruptured disds, at L4 and L5, cleaned discs no hardware very helpful. 2 years later while recovering from angiogram with weights applied to left leg caused a recurrence and required surgery again in same area with disc repair  . Knee arthroscopy      right knee  . Lumbar laminectomy/decompression microdiscectomy Bilateral 04/10/2015    Procedure: LUMBAR LAMINECTOMY/DECOMPRESSION MICRODISCECTOMY 2 LEVELS;  Surgeon: Ashok Pall, MD;  Location: [facility];  Service: Neurosurgery;  Laterality: Bilateral;  Bilateral L45 L5S1 Laminectomy and Foraminotomy  . Cardiac catheterization N/A 05/11/2016    Procedure: Left Heart Cath and Coronary Angiography;  Surgeon: Belva Crome, MD;  Location: [facility];  Service: Cardiovascular;  Laterality: N/A;  . Cardiac catheterization N/A 05/11/2016    Procedure: Intravascular Pressure Wire/FFR Study;  Surgeon: Belva Crome, MD;  Location: [facility];  Service: Cardiovascular;  Laterality: N/A;  . Coronary artery bypass graft N/A 05/17/2016    Procedure: CORONARY ARTERY BYPASS GRAFTING (CABG) x 6 using left mammory artery and right greater saphenous vein harvested endoscopically.  [place]endricks to LAD, SVG to Diagonal, SVG Sequential to OM2 and ramus intermediate, SVG Sequential to RCA and PDA;  Surgeon: [name], MD;  Location: [facility];  Service: Open Heart Surgery;  Laterality: N/A;  . Tee without cardioversion N/A 05/17/2016    Procedure: TRANSESOPHAGEAL ECHOCARDIOGRAM (TEE);  Surgeon: [name], MD;  Location: [facility];  Service: Open Heart Surgery;  Laterality: N/A;    Family History  Problem Relation Age of Onset  . Heart disease Father   . Emphysema Father   . Hypertension Father   . Cancer Father     lung/ healthy   . Heart disease Mother     Deceased  . Cancer Mother     breast  . Hypertension Mother   . Cancer Maternal Grandmother     colon  . Diabetes Brother     type 2  . Heart disease Brother   . Pulmonary embolism Son     vasculiti    Social History Social History  Substance Use Topics  . Smoking status: Former Smoker -- 2.00 packs/day for 30 years    Types: Cigarettes    Start date: 12/27/1983  . Smokeless tobacco: Never Used  . Alcohol Use: 0.0 oz/week    0 Standard drinks or equivalent per week     Comment: occasionally    Current Outpatient Prescriptions  Medication Sig Dispense Refill  . acetaminophen (TYLENOL) 325 MG tablet Take 2 tablets (650 mg total) by mouth every 6 (six) hours as needed for mild pain.    Marland Kitchen aspirin 81 MG tablet Take 81 mg by mouth daily.    . folic acid (FOLVITE) 1 MG tablet Take 1 tablet (1 mg total) by mouth daily. For one month then stop.    . furosemide (LASIX) 40 MG tablet Take 1 tablet (40 mg total) by mouth daily. For 4 days then stop. 4 tablet 0  . glipiZIDE (GLUCOTROL) 5 MG tablet Take 2 tablets (10 mg total) by mouth 2 (two) times daily before a meal. 360 tablet 2  . levothyroxine (SYNTHROID, LEVOTHROID) 100 MCG tablet TAKE 1 TABLET (100 MCG TOTAL) BY MOUTH DAILY BEFORE BREAKFAST. 90 tablet 1  . metFORMIN (GLUCOPHAGE-XR) 500 MG 24 hr tablet Take 2 tablets (1,000 mg total) by mouth 2 (two) times daily. 360 tablet 2  . metoprolol tartrate (LOPRESSOR) 25 MG tablet Take 0.5 tablets (12.5 mg total) by mouth 2 (two) times daily. 30 tablet 1  . potassium chloride SA (K-DUR,KLOR-CON) 20 MEQ tablet Take 1 tablet (20 mEq total) by mouth daily. For 4 days then stop. 4 tablet 0  . rosuvastatin (CRESTOR) 40 MG tablet Take 1 tablet (40 mg total) by mouth daily. 90 tablet 3  . traMADol (ULTRAM) 50 MG tablet Take 1 tablet (50 mg total) by mouth every 4 (four) hours as needed for moderate pain. 30 tablet 0  . ferrous gluconate (FERGON) 324 MG tablet Take 1  tablet (324 mg total) by mouth daily with breakfast. For one month then stop. (Patient not taking: Reported on 05/27/2016) 30 tablet 0   No current facility-administered medications for this visit.    No Known Allergies  Review of Systems   Patient with fatigue with activity and dizziness-we'll recheck CBC today He'll continue his iron as prescribed  BP 97/56 mmHg  Pulse 77  Temp(Src) 97 F (36.1 C) (Oral)  Resp 16  Ht 6' (1.829 m)  Wt 210 lb (95.255 kg)  BMI 28.47 kg/m2 Physical Exam Alert  and comfortable Breath sounds clear Heart rate regular Sternum stable without evidence of infection Abdomen soft Leg incision well-healed No significant lower extremity edema Neuro intact  Diagnostic Tests: CBC pending  Impression:plan Continue local wound care, right leg elevation, resume oral iron tablets Return for wound check in about a week      Len Childs, MD [facility] [phone]

## 2016-06-02 ENCOUNTER — Ambulatory Visit
Admission: RE | Admit: 2016-06-02 | Discharge: 2016-06-02 | Disposition: A | Discharge status: Home / Self Care | Payer: Medicare Other | Source: Ambulatory Visit | Attending: Cardiothoracic Surgery | Admitting: Cardiothoracic Surgery

## 2016-06-02 ENCOUNTER — Other Ambulatory Visit: Payer: Self-pay | Admitting: Cardiothoracic Surgery

## 2016-06-02 ENCOUNTER — Encounter: Payer: Self-pay | Admitting: Cardiothoracic Surgery

## 2016-06-02 ENCOUNTER — Ambulatory Visit (INDEPENDENT_AMBULATORY_CARE_PROVIDER_SITE_OTHER): Payer: Self-pay | Admitting: Cardiothoracic Surgery

## 2016-06-02 VITALS — BP 110/66 | HR 83 | Resp 20 | Ht 72.0 in | Wt 210.0 lb

## 2016-06-02 DIAGNOSIS — R0602 Shortness of breath: Secondary | ICD-10-CM | POA: Diagnosis not present

## 2016-06-02 DIAGNOSIS — I2510 Atherosclerotic heart disease of native coronary artery without angina pectoris: Secondary | ICD-10-CM

## 2016-06-02 DIAGNOSIS — Z951 Presence of aortocoronary bypass graft: Secondary | ICD-10-CM

## 2016-06-02 DIAGNOSIS — I25119 Atherosclerotic heart disease of native coronary artery with unspecified angina pectoris: Secondary | ICD-10-CM

## 2016-06-02 NOTE — Patient Instructions (Signed)
Stop Lopressor   .Coronary Artery Bypass Grafting, Care After Refer to this sheet in the next few weeks. These instructions provide you with information on caring for yourself after your procedure. Your health care provider may also give you more specific instructions. Your treatment has been planned according to current medical practices, but problems sometimes occur. Call your health care provider if you have any problems or questions after your procedure. WHAT TO EXPECT AFTER THE PROCEDURE Recovery from surgery will be different for everyone. Some people feel well after 3 or 4 weeks, while for others it takes longer. After your procedure, it is typical to have the following:  Nausea and a lack of appetite.   Constipation.  Weakness and fatigue.   Depression or irritability.   Pain or discomfort at your incision site. HOME CARE INSTRUCTIONS  Take medicines only as directed by your health care provider. Do not stop taking medicines or start any new medicines without first checking with your health care provider.  Take your pulse as directed by your health care provider.  Perform deep breathing as directed by your health care provider. If you were given a device called an incentive spirometer, use it to practice deep breathing several times a day. Support your chest with a pillow or your arms when you take deep breaths or cough.  Keep incision areas clean, dry, and protected. Remove or change any bandages (dressings) only as directed by your health care provider. You may have skin adhesive strips over the incision areas. Do not take the strips off. They will fall off on their own.  Check incision areas daily for any swelling, redness, or drainage.  If incisions were made in your legs, do the following:  Avoid crossing your legs.   Avoid sitting for long periods of time. Change positions every 30 minutes.   Elevate your legs when you are sitting.  Wear compression stockings  as directed by your health care provider. These stockings help keep blood clots from forming in your legs.  Take showers once your health care provider approves. Until then, only take sponge baths. Pat incisions dry. Do not rub incisions with a washcloth or towel. Do not take baths, swim, or use a hot tub until your health care provider approves.  Eat foods that are high in fiber, such as raw fruits and vegetables, whole grains, beans, and nuts. Meats should be lean cut. Avoid canned, processed, and fried foods.  Drink enough fluid to keep your urine clear or pale yellow.  Weigh yourself every day. This helps identify if you are retaining fluid that may make your heart and lungs work harder.  Rest and limit activity as directed by your health care provider. You may be instructed to:  Stop any activity at once if you have chest pain, shortness of breath, irregular heartbeats, or dizziness. Get help right away if you have any of these symptoms.  Move around frequently for short periods or take short walks as directed by your health care provider. Increase your activities gradually. You may need physical therapy or cardiac rehabilitation to help strengthen your muscles and build your endurance.  Avoid lifting, pushing, or pulling anything heavier than 10 lb (4.5 kg) for at least 6 weeks after surgery.  Do not drive until your health care provider approves.  Ask your health care provider when you may return to work.  Ask your health care provider when you may resume sexual activity.  Keep all follow-up visits as directed  by your health care provider. This is important. SEEK MEDICAL CARE IF:  You have swelling, redness, increasing pain, or drainage at the site of an incision.  You have a fever.  You have swelling in your ankles or legs.  You have pain in your legs.   You gain 2 or more pounds (0.9 kg) a day.  You are nauseous or vomit.  You have diarrhea. SEEK IMMEDIATE MEDICAL  CARE IF:  You have chest pain that goes to your jaw or arms.  You have shortness of breath.   You have a fast or irregular heartbeat.   You notice a "clicking" in your breastbone (sternum) when you move.   You have numbness or weakness in your arms or legs.  You feel dizzy or light-headed.  MAKE SURE YOU:  Understand these instructions.  Will watch your condition.  Will get help right away if you are not doing well or get worse.   This information is not intended to replace advice given to you by your health care provider. Make sure you discuss any questions you have with your health care provider.   Document Released: 07/01/2005 Document Revised: 01/02/2015 Document Reviewed: 05/21/2013 Elsevier Interactive Patient Education [employer].

## 2016-06-02 NOTE — Progress Notes (Signed)
Jack Wade       [place] [address]             [phone]      Jack Wade [facility] Medical Record [id number] Date of Birth: 21-Nov-1941  Referring: Belva Crome, MD Primary Care: Penni Homans, MD  Cardiology:Dr Crenshaw   Chief Complaint:   POST OP FOLLOW UP DATE OF PROCEDURE: 05/17/2016 POSTOPERATIVE DIAGNOSIS: Coronary occlusive disease. POSTOPERATIVE DIAGNOSIS: Coronary occlusive disease. SURGICAL PROCEDURE: Coronary artery bypass grafting x6 with left internal mammary to left anterior descending coronary artery, reverse saphenous vein graft to the 2nd diagonal, sequential reverse saphenous vein graft to the intermediate and distal circumflex, sequential reverse saphenous vein graft to the distal right coronary artery and mid posterior descending coronary artery with right leg greater saphenous endoscopic vein harvest, right thigh and calf. SURGEON: Lanelle Bal, M.D.  History of Present Illness:     Patient was seen in the office last week after discharge from the hospital by Dr. branch right for a small amount of drainage in the lower sternal incision, was started on Keflex. He's had no fever chills or drainage is completely resolved. His biggest complaint now is still generalized weakness, and lightheadedness when he first stands. He also complains of poor appetite and food not tasting well.     Past Medical History  Diagnosis Date  . Hyperlipidemia   . Hypertension   . CAD (coronary artery disease)     2 stents  . Diabetes type 2, controlled ([place])   . Chicken pox   . Measles   . Appendicitis   . Mumps as a child  . Melanoma ([name])     Scalp. 2012  . Hypothyroidism   . Thyroid disease     [place] Hyperthyroidism  . Dysphagia, unspecified(787.20) 03/20/2014  . Back pain 02/09/2015  . Aortic calcification ([facility]) 02/09/2015  . Hereditary and idiopathic peripheral neuropathy 02/09/2015  . Medicare annual wellness  visit, subsequent 06/14/2015  . Diabetes mellitus type 2 in obese ([place]) 04/11/2016     History  Smoking status  . Former Smoker -- 2.00 packs/day for 30 years  . Types: Cigarettes  . Start date: 12/27/1983  Smokeless tobacco  . Never Used    History  Alcohol Use  . 0.0 oz/week  . 0 Standard drinks or equivalent per week    Comment: occasionally     No Known Allergies  Current Outpatient Prescriptions  Medication Sig Dispense Refill  . acetaminophen (TYLENOL) 325 MG tablet Take 2 tablets (650 mg total) by mouth every 6 (six) hours as needed for mild pain.    Marland Kitchen aspirin 81 MG tablet Take 81 mg by mouth daily.    . cephALEXin (KEFLEX) 500 MG capsule TAKE ONE CAPSULE BY MOUTH 3 TIMES A DAY FOR 7 DAYS  0  . ferrous gluconate (FERGON) 324 MG tablet Take 1 tablet (324 mg total) by mouth daily with breakfast. For one month then stop. 30 tablet 0  . folic acid (FOLVITE) 1 MG tablet Take 1 tablet (1 mg total) by mouth daily. For one month then stop.    Marland Kitchen glipiZIDE (GLUCOTROL) 5 MG tablet Take 2 tablets (10 mg total) by mouth 2 (two) times daily before a meal. 360 tablet 2  . levothyroxine (SYNTHROID, LEVOTHROID) 100 MCG tablet TAKE 1 TABLET (100 MCG TOTAL) BY MOUTH DAILY BEFORE BREAKFAST. 90 tablet 1  . metFORMIN (GLUCOPHAGE-XR) 500 MG 24 hr tablet Take 2 tablets (  1,000 mg total) by mouth 2 (two) times daily. 360 tablet 2  . metoprolol tartrate (LOPRESSOR) 25 MG tablet Take 0.5 tablets (12.5 mg total) by mouth 2 (two) times daily. 30 tablet 1  . rosuvastatin (CRESTOR) 40 MG tablet Take 1 tablet (40 mg total) by mouth daily. 90 tablet 3  . traMADol (ULTRAM) 50 MG tablet Take 1 tablet (50 mg total) by mouth every 4 (four) hours as needed for moderate pain. 30 tablet 0   No current facility-administered medications for this visit.       Physical Exam: BP 110/66 mmHg  Pulse 83  Resp 20  Ht 6' (1.829 m)  Wt 210 lb (95.255 kg)  BMI 28.47 kg/m2  SpO2 95% 100/66 standing  General  appearance: alert, cooperative, appears stated age and no distress Neurologic: intact Heart: regular rate and rhythm, S1, S2 normal, no murmur, click, rub or gallop Lungs: clear to auscultation bilaterally Abdomen: soft, non-tender; bowel sounds normal; no masses,  no organomegaly Extremities: extremities normal, atraumatic, no cyanosis or edema and Homans sign is negative, no sign of DVT Wound: Sternum is stable and well-healed there is no evidence of infection or drainage.   Diagnostic Studies & Laboratory data:     Recent Radiology Findings:   No results found.    Recent Lab Findings: Lab Results  Component Value Date   WBC 11.6* 05/27/2016   HGB 8.8* 05/27/2016   HCT 28.3* 05/27/2016   PLT 493* 05/27/2016   GLUCOSE 131* 05/23/2016   CHOL 122 04/11/2016   TRIG 308.0* 04/11/2016   HDL 39.30 04/11/2016   LDLDIRECT 43.0 04/11/2016   LDLCALC 45 12/11/2015   ALT 25 05/16/2016   AST 28 05/16/2016   NA 135 05/23/2016   K 4.1 05/23/2016   CL 103 05/23/2016   CREATININE 1.39* 05/23/2016   BUN 22* 05/23/2016   CO2 26 05/23/2016   TSH 3.31 04/11/2016   INR 1.61* 05/17/2016   HGBA1C 7.7* 05/16/2016      Assessment / Plan:   Mild orthostatic hypotension postop, I recommended to the patient that we hold his Lopressor for now, he continues on aspirin and Crestor. He has appointment with cardiology early next week. Plan to see him back on June 29 and at that point consider starting cardiac rehabilitation.    [name] MD      [address] [place] [address] Office [phone]   Beeper [phone]  06/02/2016 9:36 AM

## 2016-06-06 ENCOUNTER — Ambulatory Visit (INDEPENDENT_AMBULATORY_CARE_PROVIDER_SITE_OTHER): Payer: Medicare Other | Admitting: Cardiology

## 2016-06-06 ENCOUNTER — Encounter: Payer: Self-pay | Admitting: Cardiology

## 2016-06-06 VITALS — BP 103/69 | HR 94 | Ht 72.0 in | Wt 210.2 lb

## 2016-06-06 DIAGNOSIS — I208 Other forms of angina pectoris: Secondary | ICD-10-CM

## 2016-06-06 DIAGNOSIS — Z951 Presence of aortocoronary bypass graft: Secondary | ICD-10-CM

## 2016-06-06 DIAGNOSIS — N183 Chronic kidney disease, stage 3 unspecified: Secondary | ICD-10-CM | POA: Insufficient documentation

## 2016-06-06 DIAGNOSIS — E118 Type 2 diabetes mellitus with unspecified complications: Secondary | ICD-10-CM | POA: Diagnosis not present

## 2016-06-06 DIAGNOSIS — R5383 Other fatigue: Secondary | ICD-10-CM | POA: Diagnosis not present

## 2016-06-06 NOTE — Assessment & Plan Note (Signed)
GFR 56

## 2016-06-06 NOTE — Patient Instructions (Signed)
Your physician recommends that you continue on your current medications as directed. Please refer to the Current Medication list given to you today.  Please keep your previously scheduled appointment with Dr Stanford Breed on 08/05/16.  If you need a refill on your cardiac medications before your next appointment, please call your pharmacy.

## 2016-06-06 NOTE — Assessment & Plan Note (Signed)
CABG x 6 05/17/16

## 2016-06-06 NOTE — Assessment & Plan Note (Signed)
Post CABG anorexia and fatigue with slow improvement

## 2016-06-06 NOTE — Progress Notes (Signed)
06/06/2016 Jack Wade   [birth year]  [id number]  Primary Physician Penni Homans, MD Primary Cardiologist: Dr Stanford Breed  HPI:  75 y.o. male with a known history of coronary occlusive disease having had 2 stents placed in the proximal right coronary artery in June 1998. He had cardiac catheterization done 05/16/16 because of increasing symptoms of substernal chest pain while walking with neighbors in the neighborhood and also while cutting his grass.  Because of these symptoms he had a nuclear stress test 05/03/16 with mild inferior lateral and apical lateral wall mild ischemia ejection fraction 58%. He noted the symptoms are very similar to the discomfort that he had prior to his stent placement in 1998. The patient is a known diabetic on metformin and Glucotrol. He has had no known myocardial infarction. He underwent cath as noted that showed severe 3V CAD. He underwent CABG x 6 on 05/17/16 by Servando Snare. Post op he has had fatigue and anorexia. He had some sternal drainage treated with Keflex last week and that seems to have resolved. He was noted to be orthostatic on 06/02/16 and his beta blocker was held by Dr gerhardt. He is in the office today for follow up. He is frustrated because he is so weak. He says he gives out with any exertion. He did tell me his appetite is slowly improving. He denies orthopnea. EKG shows NSR. His Hgb ws 8.8 on 6/2.    Current Outpatient Prescriptions  Medication Sig Dispense Refill  . acetaminophen (TYLENOL) 325 MG tablet Take 2 tablets (650 mg total) by mouth every 6 (six) hours as needed for mild pain.    Marland Kitchen aspirin 81 MG tablet Take 81 mg by mouth daily.    . ferrous gluconate (FERGON) 324 MG tablet Take 1 tablet (324 mg total) by mouth daily with breakfast. For one month then stop. 30 tablet 0  . folic acid (FOLVITE) 1 MG tablet Take 1 tablet (1 mg total) by mouth daily. For one month then stop.    Marland Kitchen glipiZIDE (GLUCOTROL) 5 MG tablet Take 2 tablets (10 mg  total) by mouth 2 (two) times daily before a meal. 360 tablet 2  . levothyroxine (SYNTHROID, LEVOTHROID) 100 MCG tablet TAKE 1 TABLET (100 MCG TOTAL) BY MOUTH DAILY BEFORE BREAKFAST. 90 tablet 1  . metFORMIN (GLUCOPHAGE-XR) 500 MG 24 hr tablet Take 2 tablets (1,000 mg total) by mouth 2 (two) times daily. 360 tablet 2  . rosuvastatin (CRESTOR) 40 MG tablet Take 1 tablet (40 mg total) by mouth daily. 90 tablet 3  . metoprolol tartrate (LOPRESSOR) 25 MG tablet Take 0.5 tablets (12.5 mg total) by mouth 2 (two) times daily. (Patient not taking: Reported on 06/06/2016) 30 tablet 1   No current facility-administered medications for this visit.    No Known Allergies  Social History   Social History  . Marital Status: Married    Spouse Name: N/A  . Number of Children: 2  . Years of Education: N/A   Occupational History  . Not on file.   Social History Main Topics  . Smoking status: Former Smoker -- 2.00 packs/day for 30 years    Types: Cigarettes    Start date: 12/27/1983  . Smokeless tobacco: Never Used  . Alcohol Use: 0.0 oz/week    0 Standard drinks or equivalent per week     Comment: occasionally  . Drug Use: No  . 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  Other Topics Concern  . Not on file   Social History Narrative     Review of Systems: General: negative for chills, fever, night sweats or weight changes.  Cardiovascular: negative for chest pain, dyspnea on exertion, edema, orthopnea, palpitations, paroxysmal nocturnal dyspnea or shortness of breath Dermatological: negative for rash Respiratory: negative for cough or wheezing Urologic: negative for hematuria Abdominal: negative for nausea, vomiting, diarrhea, bright red blood per rectum, melena, or hematemesis Neurologic: negative for visual changes, syncope, or dizziness All other systems reviewed and are otherwise negative except as noted above.    Blood  pressure 103/69, pulse 94, height 6' (1.829 m), weight 210 lb 4 oz (95.369 kg).  General appearance: alert, cooperative and no distress Neck: no carotid bruit and no JVD Lungs: clear to auscultation bilaterally Heart: regular rate and rhythm Extremities: no edema Skin: pale cool dry Neurologic: Grossly normal  EKG NSR, LAD, RBBB  ASSESSMENT AND PLAN:   Fatigue Post CABG anorexia and fatigue with slow improvement  Hx of CABG 05/17/16 CABG x 6 05/17/16  Type 2 diabetes with complication (HCC) CABG x 6 05/17/16  Chronic renal disease, stage III GFR 56   PLAN  Same Rx. I told him to increase his activity as tolerated and explained in some pt's it takes longer for a full recovery, especially with DM,  but that it was till early. I did not think he was volume overloaded on exam.   Kerin Ransom PA-C 06/06/2016 12:31 PM

## 2016-06-16 ENCOUNTER — Inpatient Hospital Stay: Payer: Medicare Other | Admitting: Family Medicine

## 2016-06-22 ENCOUNTER — Other Ambulatory Visit: Payer: Self-pay | Admitting: Cardiothoracic Surgery

## 2016-06-22 DIAGNOSIS — Z951 Presence of aortocoronary bypass graft: Secondary | ICD-10-CM

## 2016-06-23 ENCOUNTER — Encounter: Payer: Self-pay | Admitting: Cardiothoracic Surgery

## 2016-06-23 ENCOUNTER — Ambulatory Visit
Admission: RE | Admit: 2016-06-23 | Discharge: 2016-06-23 | Disposition: A | Discharge status: Home / Self Care | Payer: Medicare Other | Source: Ambulatory Visit | Attending: Cardiothoracic Surgery | Admitting: Cardiothoracic Surgery

## 2016-06-23 ENCOUNTER — Ambulatory Visit (INDEPENDENT_AMBULATORY_CARE_PROVIDER_SITE_OTHER): Payer: Self-pay | Admitting: Cardiothoracic Surgery

## 2016-06-23 ENCOUNTER — Ambulatory Visit: Payer: Medicare Other | Admitting: Cardiothoracic Surgery

## 2016-06-23 VITALS — BP 107/71 | HR 90 | Resp 16 | Ht 72.0 in | Wt 210.0 lb

## 2016-06-23 DIAGNOSIS — Z951 Presence of aortocoronary bypass graft: Secondary | ICD-10-CM

## 2016-06-23 DIAGNOSIS — I2510 Atherosclerotic heart disease of native coronary artery without angina pectoris: Secondary | ICD-10-CM

## 2016-06-23 DIAGNOSIS — J90 Pleural effusion, not elsewhere classified: Secondary | ICD-10-CM | POA: Diagnosis not present

## 2016-06-23 NOTE — Progress Notes (Signed)
[address]       [place] [address]             [phone]      Ariz C Dabney [facility] Medical Record [id number] Date of Birth: Apr 03, 1941  Referring: Belva Crome, MD Primary Care: Penni Homans, MD  Cardiology:Dr Crenshaw   Chief Complaint:   POST OP FOLLOW UP DATE OF PROCEDURE: 05/17/2016 POSTOPERATIVE DIAGNOSIS: Coronary occlusive disease. POSTOPERATIVE DIAGNOSIS: Coronary occlusive disease. SURGICAL PROCEDURE: Coronary artery bypass grafting x6 with left internal mammary to left anterior descending coronary artery, reverse saphenous vein graft to the 2nd diagonal, sequential reverse saphenous vein graft to the intermediate and distal circumflex, sequential reverse saphenous vein graft to the distal right coronary artery and mid posterior descending coronary artery with right leg greater saphenous endoscopic vein harvest, right thigh and calf. SURGEON: Lanelle Bal, M.D.  History of Present Illness:      Patient returns office today one month following coronary artery bypass grafting. He notes he feels 5000% better than he did on his last visit. He's increased his physical activity appropriately, has no recurrent angina or evidence of congestive heart failure. On his first visit postoperatively his blood pressure was low we discontinued his Lopressor and since he notes he feels much better. At his last visit I recommended that he obtain a blood pressure cuff and check his blood pressure, it still runs systolic [id number] so we will not resume his Lopressor at this point.   Past Medical History  Diagnosis Date  . Hyperlipidemia   . Hypertension   . CAD (coronary artery disease)     2 stents  . Diabetes type 2, controlled ([place])   . Chicken pox   . Measles   . Appendicitis   . Mumps as a child  . Melanoma ([place])     Scalp. 2012  . Hypothyroidism   . Thyroid disease     [place] Hyperthyroidism  . Dysphagia, unspecified(787.20)  03/20/2014  . Back pain 02/09/2015  . Aortic calcification ([place]) 02/09/2015  . Hereditary and idiopathic peripheral neuropathy 02/09/2015  . Medicare annual wellness visit, subsequent 06/14/2015  . Diabetes mellitus type 2 in obese ([place]) 04/11/2016     History  Smoking status  . Former Smoker -- 2.00 packs/day for 30 years  . Types: Cigarettes  . Start date: 12/27/1983  Smokeless tobacco  . Never Used    History  Alcohol Use  . 0.0 oz/week  . 0 Standard drinks or equivalent per week    Comment: occasionally     No Known Allergies  Current Outpatient Prescriptions  Medication Sig Dispense Refill  . acetaminophen (TYLENOL) 325 MG tablet Take 2 tablets (650 mg total) by mouth every 6 (six) hours as needed for mild pain.    Marland Kitchen aspirin 81 MG tablet Take 81 mg by mouth daily.    Marland Kitchen glipiZIDE (GLUCOTROL) 5 MG tablet Take 2 tablets (10 mg total) by mouth 2 (two) times daily before a meal. 360 tablet 2  . levothyroxine (SYNTHROID, LEVOTHROID) 100 MCG tablet TAKE 1 TABLET (100 MCG TOTAL) BY MOUTH DAILY BEFORE BREAKFAST. 90 tablet 1  . metFORMIN (GLUCOPHAGE-XR) 500 MG 24 hr tablet Take 2 tablets (1,000 mg total) by mouth 2 (two) times daily. 360 tablet 2  . rosuvastatin (CRESTOR) 40 MG tablet Take 1 tablet (40 mg total) by mouth daily. 90 tablet 3   No current facility-administered medications for this visit.  Physical Exam: BP 107/71 mmHg  Pulse 90  Resp 16  Ht 6' (1.829 m)  Wt 210 lb (95.255 kg)  BMI 28.47 kg/m2  SpO2 98% 100/66 standing  General appearance: alert, cooperative, appears stated age and no distress Neurologic: intact Heart: regular rate and rhythm, S1, S2 normal, no murmur, click, rub or gallop Lungs: clear to auscultation bilaterally Abdomen: soft, non-tender; bowel sounds normal; no masses,  no organomegaly Extremities: extremities normal, atraumatic, no cyanosis or edema and Homans sign is negative, no sign of DVT Wound: Sternum is stable and well-healed  there is no evidence of infection or drainage.   Diagnostic Studies & Laboratory data:     Recent Radiology Findings:   Dg Chest 2 View  06/23/2016  CLINICAL DATA:  History of Coronary artery bypass graft. EXAM: CHEST  2 VIEW COMPARISON:  Radiograph of June 02, 2016. FINDINGS: Stable cardiomegaly. Status post Coronary artery bypass graft. No pneumothorax is noted. Minimal bilateral pleural effusions are noted. Bony thorax is unremarkable. No acute pulmonary disease is noted. IMPRESSION: Minimal bilateral pleural effusions are noted. Status post Coronary artery bypass graft. Electronically Signed   By: Marijo Conception, M.D.   On: 06/23/2016 10:40      Recent Lab Findings: Lab Results  Component Value Date   WBC 11.6* 05/27/2016   HGB 8.8* 05/27/2016   HCT 28.3* 05/27/2016   PLT 493* 05/27/2016   GLUCOSE 131* 05/23/2016   CHOL 122 04/11/2016   TRIG 308.0* 04/11/2016   HDL 39.30 04/11/2016   LDLDIRECT 43.0 04/11/2016   LDLCALC 45 12/11/2015   ALT 25 05/16/2016   AST 28 05/16/2016   NA 135 05/23/2016   K 4.1 05/23/2016   CL 103 05/23/2016   CREATININE 1.39* 05/23/2016   BUN 22* 05/23/2016   CO2 26 05/23/2016   TSH 3.31 04/11/2016   INR 1.61* 05/17/2016   HGBA1C 7.7* 05/16/2016      Assessment / Plan:   Patient making good progress following surgery 1 month ago, recommended that he now start cardiac rehabilitation, avoid lifting over 25 pounds for 3 months, he will return to driving, he is no longer using any pain medication. We'll plan to see him back in 6-8 weeks He continues to see cardiology and at some point may need to go back on beta blocker.   [name] MD      [address] [place] [address] Office [phone]   Beeper [phone]  06/23/2016 11:43 AM

## 2016-07-08 ENCOUNTER — Ambulatory Visit (INDEPENDENT_AMBULATORY_CARE_PROVIDER_SITE_OTHER): Payer: Medicare Other | Admitting: Medical

## 2016-07-08 ENCOUNTER — Encounter: Payer: Self-pay | Admitting: Medical

## 2016-07-08 VITALS — BP 110/70 | HR 72 | Temp 98.3°F | Ht 72.0 in | Wt 193.6 lb

## 2016-07-08 DIAGNOSIS — E1169 Type 2 diabetes mellitus with other specified complication: Secondary | ICD-10-CM

## 2016-07-08 DIAGNOSIS — E669 Obesity, unspecified: Secondary | ICD-10-CM

## 2016-07-08 DIAGNOSIS — W57XXXA Bitten or stung by nonvenomous insect and other nonvenomous arthropods, initial encounter: Secondary | ICD-10-CM

## 2016-07-08 DIAGNOSIS — R21 Rash and other nonspecific skin eruption: Secondary | ICD-10-CM | POA: Diagnosis not present

## 2016-07-08 DIAGNOSIS — I208 Other forms of angina pectoris: Secondary | ICD-10-CM

## 2016-07-08 DIAGNOSIS — T148 Other injury of unspecified body region: Secondary | ICD-10-CM

## 2016-07-08 DIAGNOSIS — E119 Type 2 diabetes mellitus without complications: Secondary | ICD-10-CM | POA: Diagnosis not present

## 2016-07-08 DIAGNOSIS — H5203 Hypermetropia, bilateral: Secondary | ICD-10-CM | POA: Diagnosis not present

## 2016-07-08 LAB — COMPREHENSIVE METABOLIC PANEL
ALT: 11 U/L (ref 0–53)
AST: 18 U/L (ref 0–37)
Albumin: 4.5 g/dL (ref 3.5–5.2)
Alkaline Phosphatase: 58 U/L (ref 39–117)
BUN: 16 mg/dL (ref 6–23)
CO2: 25 mEq/L (ref 19–32)
Calcium: 10.3 mg/dL (ref 8.4–10.5)
Chloride: 104 mEq/L (ref 96–112)
Creatinine, Ser: 1.35 mg/dL (ref 0.40–1.50)
GFR: 54.97 mL/min — ABNORMAL LOW (ref >60.00)
Glucose, Bld: 125 mg/dL — ABNORMAL HIGH (ref 70–99)
Potassium: 4 mEq/L (ref 3.5–5.1)
Sodium: 138 mEq/L (ref 135–145)
Total Bilirubin: 0.4 mg/dL (ref 0.2–1.2)
Total Protein: 8.1 g/dL (ref 6.0–8.3)

## 2016-07-08 MED ORDER — DOXYCYCLINE HYCLATE 100 MG PO TABS: 100.0000 mg | ORAL_TABLET | Freq: Two times a day (BID) | ORAL | Status: DC

## 2016-07-08 NOTE — Progress Notes (Signed)
Pre visit review using our clinic review tool, if applicable. No additional management support is needed unless otherwise documented below in the visit note. 

## 2016-07-08 NOTE — Progress Notes (Signed)
Subjective:    Patient ID: Jack Wade, male    DOB: 01/14/1941, 75 y.o.   MRN: [id number]  HPI  Pt in with rash on his left upper chest. He had this for 3-4 days. Was itching x 1 day. Some inflammed.   No fever, no chills, no body aches but some fatigue.(But some heart surgery 6 weeks).  Pt did not see any tick on him. But he has concern that it may be tick bite with bulls eye rash. Though no tick seen.     Review of Systems  Constitutional: Negative for fever, chills and fatigue.  Respiratory: Negative for cough, chest tightness, shortness of breath and wheezing.   Cardiovascular: Negative for chest pain and palpitations.  Gastrointestinal: Negative for abdominal pain.  Skin: Positive for rash.  Neurological: Negative for dizziness and headaches.  Hematological: Negative for adenopathy. Does not bruise/bleed easily.  Psychiatric/Behavioral: Negative for behavioral problems and confusion.   Past Medical History  Diagnosis Date  . Hyperlipidemia   . Hypertension   . CAD (coronary artery disease)     2 stents  . Diabetes type 2, controlled ([place])   . Chicken pox   . Measles   . Appendicitis   . Mumps as a child  . Melanoma ([place])     Scalp. 2012  . Hypothyroidism   . Thyroid disease     [place] Hyperthyroidism  . Dysphagia, unspecified(787.20) 03/20/2014  . Back pain 02/09/2015  . Aortic calcification ([place]) 02/09/2015  . Hereditary and idiopathic peripheral neuropathy 02/09/2015  . Medicare annual wellness visit, subsequent 06/14/2015  . Diabetes mellitus type 2 in obese ([place]) 04/11/2016     Social History   Social History  . Marital Status: Married    Spouse Name: N/A  . Number of Children: 2  . Years of Education: N/A   Occupational History  . Not on file.   Social History Main Topics  . Smoking status: Former Smoker -- 2.00 packs/day for 30 years    Types: Cigarettes    Start date: 12/27/1983  . Smokeless tobacco: Never Used  . Alcohol Use: 0.0  oz/week    0 Standard drinks or equivalent per week     Comment: occasionally  . Drug Use: No  . 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    Other Topics Concern  . Not on file   Social History Narrative    Past Surgical History  Procedure Laterality Date  . Appendectomy    . Coronary angioplasty with stent placement      2 stents  . Cardiac catheterization    . Back surgery      x2 lumbar  . Rotator cuff repair Right   . Wisdom tooth extraction    . Tonsillectomy and adenoidectomy    . Skin cancer excision      melanoma on scalp  . Back surgery      late 1990s had 2 surgeries, first surgery lifted a heavy engine ruptured disds, at L4 and L5, cleaned discs no hardware very helpful. 2 years later while recovering from angiogram with weights applied to left leg caused a recurrence and required surgery again in same area with disc repair  . Knee arthroscopy      right knee  . Lumbar laminectomy/decompression microdiscectomy Bilateral 04/10/2015    Procedure: LUMBAR LAMINECTOMY/DECOMPRESSION MICRODISCECTOMY 2 LEVELS;  Surgeon: Ashok Pall, MD;  Location: [facility];  Service: Neurosurgery;  Laterality: Bilateral;  Bilateral L45 L5S1 Laminectomy and Foraminotomy  . Cardiac catheterization N/A 05/11/2016    Procedure: Left Heart Cath and Coronary Angiography;  Surgeon: Belva Crome, MD;  Location: [facility];  Service: Cardiovascular;  Laterality: N/A;  . Cardiac catheterization N/A 05/11/2016    Procedure: Intravascular Pressure Wire/FFR Study;  Surgeon: Belva Crome, MD;  Location: [facility];  Service: Cardiovascular;  Laterality: N/A;  . Coronary artery bypass graft N/A 05/17/2016    Procedure: CORONARY ARTERY BYPASS GRAFTING (CABG) x 6 using left mammory artery and right greater saphenous vein harvested endoscopically. [place] to LAD, SVG to Diagonal, SVG Sequential to OM2 and ramus intermediate, SVG Sequential  to RCA and PDA;  Surgeon: [name], MD;  Location: [facility];  Service: Open Heart Surgery;  Laterality: N/A;  . Tee without cardioversion N/A 05/17/2016    Procedure: TRANSESOPHAGEAL ECHOCARDIOGRAM (TEE);  Surgeon: [name], MD;  Location: [facility];  Service: Open Heart Surgery;  Laterality: N/A;    Family History  Problem Relation Age of Onset  . Heart disease Father   . Emphysema Father   . Hypertension Father   . Cancer Father     lung/ healthy  . Heart disease Mother     Deceased  . Cancer Mother     breast  . Hypertension Mother   . Cancer Maternal Grandmother     colon  . Diabetes Brother     type 2  . Heart disease Brother   . Pulmonary embolism Son     vasculiti    No Known Allergies  Current Outpatient Prescriptions on File Prior to Visit  Medication Sig Dispense Refill  . aspirin 81 MG tablet Take 81 mg by mouth daily.    Marland Kitchen glipiZIDE (GLUCOTROL) 5 MG tablet Take 2 tablets (10 mg total) by mouth 2 (two) times daily before a meal. 360 tablet 2  . levothyroxine (SYNTHROID, LEVOTHROID) 100 MCG tablet TAKE 1 TABLET (100 MCG TOTAL) BY MOUTH DAILY BEFORE BREAKFAST. 90 tablet 1  . metFORMIN (GLUCOPHAGE-XR) 500 MG 24 hr tablet Take 2 tablets (1,000 mg total) by mouth 2 (two) times daily. 360 tablet 2  . rosuvastatin (CRESTOR) 40 MG tablet Take 1 tablet (40 mg total) by mouth daily. 90 tablet 3   No current facility-administered medications on file prior to visit.    BP 110/70 mmHg  Pulse 72  Temp(Src) 98.3 F (36.8 C) (Oral)  Ht 6' (1.829 m)  Wt 193 lb 9.6 oz (87.816 kg)  BMI 26.25 kg/m2  SpO2 98%       Objective:   Physical Exam  General Mental Status- Alert. General Appearance- Not in acute distress.   Skin General: Color- Normal Color. Moisture- Normal Moisture. But 2.0 cm rash on lt upper pec. Rash has ring appearance on edge with central clearing but flackiness to skin. Inpsection of the thorax posterior and anterior aspect did not reveal any  ticks.  Neck Carotid Arteries- Normal color. Moisture- Normal Moisture. No carotid bruits. No JVD.  Chest and Lung Exam Auscultation: Breath Sounds:-Normal.  Cardiovascular Auscultation:Rythm- Regular. Murmurs & Other Heart Sounds:Auscultation of the heart reveals- No Murmurs.  Abdomen Inspection:-Inspeection Normal. Palpation/Percussion:Note:No mass. Palpation and Percussion of the abdomen reveal- Non Tender, Non Distended + BS, no rebound or guarding.    Neurologic Cranial Nerve exam:- CN III-XII intact(No nystagmus), symmetric smile. Strength:- 5/5 equal and symmetric strength both upper and lower extremities.      Assessment &  Plan:  For possible tick bite with rash will rx doxycycline antibiotic.(rx advisement given).  Will get tick bite studies today.  For diabetes and decrease kidney function. Ask you call your insurance and ask what glucometer. Then let us know name brand they prefer.Then can call in to your pharnacy.  Follow up 10 days or as needed  Note did discuss differential dx of tinea corporis. At first pt expressed doubt but they did in the end agreed to get lotirmin otc and use twice a day.  Brihana Quickel, Percell Miller, PA-C

## 2016-07-08 NOTE — Patient Instructions (Addendum)
For possible tick bite with rash will rx doxycycline antibiotic.(rx advisement given).  Will get tick bite studies today.  For diabetes and decrease kidney function. Ask you call your insurance and ask what glucometer. Then let us know name brand they prefer.Then can call in to your pharnacy.  Follow up 10 days or as needed  Note did discuss differential dx of tinea corporis. At first pt expressed doubt but they did in the end agreed to get lotirmin otc and use twice a day

## 2016-07-11 LAB — LYME AB/WESTERN BLOT REFLEX: B burgdorferi Ab IgG+IgM: <0.9 Index (ref <0.90)

## 2016-07-11 NOTE — Progress Notes (Signed)
Quick Note:  Pt has seen results on MyChart and message also sent for patient to call back if any questions. ______ 

## 2016-07-13 ENCOUNTER — Telehealth (HOSPITAL_COMMUNITY): Payer: Self-pay | Admitting: *Deleted

## 2016-07-13 LAB — ROCKY MTN SPOTTED FVR ABS PNL(IGG+IGM)
RMSF IgG: NOT DETECTED
RMSF IgM: NOT DETECTED

## 2016-07-14 ENCOUNTER — Encounter (HOSPITAL_COMMUNITY)
Admission: RE | Admit: 2016-07-14 | Discharge: 2016-07-14 | Disposition: A | Discharge status: Home / Self Care | Payer: Medicare Other | Source: Ambulatory Visit | Attending: Cardiology | Admitting: Cardiology

## 2016-07-14 VITALS — BP 102/62 | HR 96 | Ht 71.5 in | Wt 193.1 lb

## 2016-07-14 DIAGNOSIS — E119 Type 2 diabetes mellitus without complications: Secondary | ICD-10-CM | POA: Diagnosis not present

## 2016-07-14 DIAGNOSIS — Z951 Presence of aortocoronary bypass graft: Secondary | ICD-10-CM | POA: Insufficient documentation

## 2016-07-14 NOTE — Progress Notes (Signed)
Quick Note:  Pt has seen results on MyChart and message also sent for patient to call back if any questions. ______ 

## 2016-07-14 NOTE — Progress Notes (Signed)
Cardiac Rehab Medication Review by a Pharmacist  Does the patient  feel that his/her medications are working for him/her?  yes  Has the patient been experiencing any side effects to the medications prescribed?  no  Does the patient measure his/her own blood pressure or blood glucose at home?  yes   Does the patient have any problems obtaining medications due to transportation or finances?   no  Understanding of regimen: good Understanding of indications: excellent Potential of compliance: good    Pharmacist comments: Patient is a 75 yo male who presented to cardiac rehab in good spirits. He was a good historian with a good understanding of his medication regimen. Endorses good medication compliance and is not having any issues with side effects at this time. He was measuring his blood pressure at home right after his surgery and was within goal of <140/90. He has since not been measuring at home. Today is his last day of doxycyline that he was taking for a tick bite.   Demetrius Charity, PharmD Acute Care Pharmacy Resident  Pager: [phone] 07/14/2016

## 2016-07-15 ENCOUNTER — Telehealth: Payer: Self-pay

## 2016-07-15 ENCOUNTER — Encounter (HOSPITAL_COMMUNITY): Payer: Self-pay

## 2016-07-15 ENCOUNTER — Ambulatory Visit: Payer: Medicare Other

## 2016-07-15 DIAGNOSIS — E118 Type 2 diabetes mellitus with unspecified complications: Secondary | ICD-10-CM

## 2016-07-15 LAB — LYME ABY, WSTRN BLT IGG & IGM W/BANDS

## 2016-07-15 MED ORDER — GLUCOSE BLOOD VI STRP: ORAL_STRIP | Status: DC

## 2016-07-15 MED ORDER — ONETOUCH LANCETS MISC: Status: DC

## 2016-07-15 NOTE — Progress Notes (Signed)
Pre visit review using our clinic tool,if applicable. No additional management support is needed unless otherwise documented below in the visit note.   Patient in for patient teaching regarding Glucometer and how to use. Patient given return demonstration satisfactory on how to use and record readings. Given Glucometer Test Kit to take with him.

## 2016-07-15 NOTE — Telephone Encounter (Signed)
With a status change will have people check daily and as needed if they feel badly, have him check fasting most of the time and sometimes 1-2 hours after his biggest meal and record his status with the recording. Disp #100 of strips and lancets with 5 rf

## 2016-07-15 NOTE — Telephone Encounter (Signed)
Called the patient and spoke to his wife.  Informed her of instructions for checking blood sugar.  Sent in to their local pharmacy strips/lancets.

## 2016-07-15 NOTE — Addendum Note (Signed)
Addended by: Sharon Seller B on: 07/15/2016 02:56 PM   Modules accepted: Orders, Medications

## 2016-07-15 NOTE — Telephone Encounter (Signed)
Patient in for Glucometer teaching. States he would like to know how often test blood sugar. States because of his recent surgery he ws told by Cardiology that he needs to start keeping a record. Patient is currently taking Metformin but is not on insulin. Please advise. Given patient teaching on how to use Glucometer.

## 2016-07-15 NOTE — Progress Notes (Signed)
Cardiac Individual Treatment Plan  Patient Details  Name: Jack Wade MRN: [id number] Date of Birth: 11/26/1941 Referring Provider:        CARDIAC REHAB PHASE II ORIENTATION from 07/14/2016 in [place]   Referring Provider  Kirk Ruths MD      Initial Encounter Date:       CARDIAC REHAB PHASE II ORIENTATION from 07/14/2016 in [place]   Date  07/14/16   Referring Provider  Kirk Ruths MD      Visit Diagnosis: 05/17/16 S/P CABG x 6  Patient's Home Medications on Admission:  Current outpatient prescriptions:  .  aspirin 81 MG tablet, Take 81 mg by mouth daily., Disp: , Rfl:  .  doxycycline (VIBRA-TABS) 100 MG tablet, Take 1 tablet (100 mg total) by mouth 2 (two) times daily., Disp: 14 tablet, Rfl: 0 .  glipiZIDE (GLUCOTROL) 5 MG tablet, Take 2 tablets (10 mg total) by mouth 2 (two) times daily before a meal., Disp: 360 tablet, Rfl: 2 .  levothyroxine (SYNTHROID, LEVOTHROID) 100 MCG tablet, TAKE 1 TABLET (100 MCG TOTAL) BY MOUTH DAILY BEFORE BREAKFAST., Disp: 90 tablet, Rfl: 1 .  metFORMIN (GLUCOPHAGE-XR) 500 MG 24 hr tablet, Take 2 tablets (1,000 mg total) by mouth 2 (two) times daily., Disp: 360 tablet, Rfl: 2 .  rosuvastatin (CRESTOR) 40 MG tablet, Take 1 tablet (40 mg total) by mouth daily., Disp: 90 tablet, Rfl: 3  Past Medical History: Past Medical History  Diagnosis Date  . Hyperlipidemia   . Hypertension   . CAD (coronary artery disease)     2 stents  . Diabetes type 2, controlled ([place])   . Chicken pox   . Measles   . Appendicitis   . Mumps as a child  . Melanoma ([place])     Scalp. 2012  . Hypothyroidism   . Thyroid disease     [place] Hyperthyroidism  . Dysphagia, unspecified(787.20) 03/20/2014  . Back pain 02/09/2015  . Aortic calcification ([place]) 02/09/2015  . Hereditary and idiopathic peripheral neuropathy 02/09/2015  . Medicare annual wellness visit, subsequent 06/14/2015  . Diabetes  mellitus type 2 in obese ([place]) 04/11/2016    Tobacco Use: History  Smoking status  . Former Smoker -- 2.00 packs/day for 30 years  . Types: Cigarettes  . Start date: 12/27/1983  Smokeless tobacco  . Never Used    Labs: Recent Review Flowsheet Data    Labs for ITP Cardiac and Pulmonary Rehab Latest Ref Rng 05/17/2016 05/17/2016 05/17/2016 05/17/2016 05/18/2016   PHART 7.350 - 7.450 7.346(L) - 7.404 7.418 -   PCO2ART 35.0 - 45.0 mmHg 37.7 - 40.3 36.2 -   HCO3 20.0 - 24.0 mEq/L 20.9 - 24.9(H) 23.1 -   TCO2 0 - 100 mmol/L 22 24 26 24 21    ACIDBASEDEF 0.0 - 2.0 mmol/L 5.0(H) - - 1.0 -   O2SAT - 97.0 - 99.0 97.0 -      Capillary Blood Glucose: Lab Results  Component Value Date   GLUCAP 159* 05/23/2016   GLUCAP 130* 05/23/2016   GLUCAP 166* 05/22/2016   GLUCAP 145* 05/22/2016   GLUCAP 200* 05/22/2016     Exercise Target Goals: Date: 07/14/16  Exercise Program Goal: Individual exercise prescription set with THRR, safety & activity barriers. Participant demonstrates ability to understand and report RPE using BORG scale, to self-measure pulse accurately, and to acknowledge the importance of the exercise prescription.  Exercise Prescription Goal: Starting with aerobic activity 30 plus  minutes a day, 3 days per week for initial exercise prescription. Provide home exercise prescription and guidelines that participant acknowledges understanding prior to discharge.  Activity Barriers & Risk Stratification:     Activity Barriers & Cardiac Risk Stratification - 07/14/16 0829    Activity Barriers & Cardiac Risk Stratification   Activity Barriers Back Problems;Other (comment)   Comments L leg is weaker than R, unable to stand on toes on L leg,  3 back surgeries in the past   Cardiac Risk Stratification High      6 Minute Walk:     6 Minute Walk      07/14/16 1343       6 Minute Walk   Phase Initial     Distance 1230 feet     Walk Time 6 minutes     # of Rest Breaks 0      MPH 2.3     METS 2.8     RPE 11     VO2 Peak 9.8     Symptoms No     Resting HR 96 bpm     Resting BP 102/62 mmHg     Max Ex. HR 133 bpm     Max Ex. BP 104/70 mmHg     2 Minute Post BP 228/74 mmHg        Initial Exercise Prescription:     Initial Exercise Prescription - 07/14/16 1300    Date of Initial Exercise RX and Referring Provider   Date 07/14/16   Referring Provider Kirk Ruths MD   Recumbant Bike   Level 2   Minutes 10   METs 2   NuStep   Level 3   Minutes 10   METs 2   Track   Laps 8   Minutes 10   METs 2.74   Prescription Details   Frequency (times per week) 3   Duration Progress to 30 minutes of continuous aerobic without signs/symptoms of physical distress   Intensity   THRR 40-80% of Max Heartrate 59-118   Ratings of Perceived Exertion 11-13   Perceived Dyspnea 0-4   Progression   Progression Continue to progress workloads to maintain intensity without signs/symptoms of physical distress.   Resistance Training   Training Prescription Yes   Weight 2lb   Reps 10-12      Perform Capillary Blood Glucose checks as needed.  Exercise Prescription Changes:   Exercise Comments:   Discharge Exercise Prescription (Final Exercise Prescription Changes):   Nutrition:  Target Goals: Understanding of nutrition guidelines, daily intake of sodium 1500mg , cholesterol 200mg , calories 30% from fat and 7% or less from saturated fats, daily to have 5 or more servings of fruits and vegetables.  Biometrics:     Pre Biometrics - 07/14/16 1346    Pre Biometrics   Waist Circumference 38.75 inches   Hip Circumference 41 inches   Waist to Hip Ratio 0.95 %   Triceps Skinfold 16 mm   % Body Fat 26.6 %   Grip Strength 42 kg   Flexibility 0 in   Single Leg Stand 24.94 seconds       Nutrition Therapy Plan and Nutrition Goals:   Nutrition Discharge: Nutrition Scores:   Nutrition Goals Re-Evaluation:   Psychosocial: Target Goals: Acknowledge  presence or absence of depression, maximize coping skills, provide positive support system. Participant is able to verbalize types and ability to use techniques and skills needed for reducing stress and depression.  Initial Review & Psychosocial Screening:  Initial Psych Review & Screening - 07/15/16 [place]? Yes   Barriers   Psychosocial barriers to participate in program There are no identifiable barriers or psychosocial needs.;The patient should benefit from training in stress management and relaxation.   Screening Interventions   Interventions Encouraged to exercise      Quality of Life Scores:     Quality of Life - 07/14/16 1350    Quality of Life Scores   Health/Function Pre 28.04 %   Socioeconomic Pre 27.17 %   Psych/Spiritual Pre 26.14 %   Family Pre 30 %   GLOBAL Pre 27.76 %      PHQ-9:     Recent Review Flowsheet Data    Depression screen PHQ 2/9 07/14/2015   Decreased Interest 0   Down, Depressed, Hopeless 0   PHQ - 2 Score 0      Psychosocial Evaluation and Intervention:   Psychosocial Re-Evaluation:   Vocational Rehabilitation: Provide vocational rehab assistance to qualifying candidates.   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 Participant will state understanding/return demonstration of topics presented.  Learning Barriers/Preferences:     Learning Barriers/Preferences - 07/14/16 0830    Learning Barriers/Preferences   Learning Barriers Sight   Learning Preferences Skilled Demonstration      Education Topics: Count Your  Pulse:  -Group instruction provided by verbal instruction, demonstration, patient participation and written materials to support subject.  Instructors address importance of being able to find your pulse and how to count your pulse when at home without a heart monitor.  Patients get hands on experience counting their pulse with staff help and individually.   Heart Attack, Angina, and Risk Factor Modification:  -Group instruction provided by verbal instruction, video, and written materials to support subject.  Instructors address signs and symptoms of angina and heart attacks.    Also discuss risk factors for heart disease and how to make changes to improve heart health risk factors.   Functional Fitness:  -Group instruction provided by verbal instruction, demonstration, patient participation, and written materials to support subject.  Instructors address safety measures for doing things around the house.  Discuss how to get up and down off the floor, how to pick things up properly, how to safely get out of a chair without assistance, and balance training.   Meditation and Mindfulness:  -Group instruction provided by verbal instruction, patient participation, and written materials to support subject.  Instructor addresses importance of mindfulness and meditation practice to help reduce stress and improve awareness.  Instructor also leads participants through a meditation exercise.    Stretching for Flexibility and Mobility:  -Group instruction provided by verbal instruction, patient participation, and written materials to support subject.  Instructors lead participants through series of stretches that are designed to increase flexibility thus improving mobility.  These stretches are additional exercise for major muscle groups that are typically performed during regular warm up and cool down.   Hands Only CPR Anytime:  -Group instruction provided by verbal instruction, video, patient participation  and written materials to support subject.  Instructors co-teach with [employer]  video for hands only CPR.  Participants get hands on experience with mannequins.   Nutrition I class: Heart Healthy Eating:  -Group instruction provided by PowerPoint slides, verbal discussion, and written materials to support subject matter. The instructor gives an explanation and review of the Therapeutic Lifestyle Changes diet recommendations, which includes a discussion on lipid goals, dietary fat, sodium, fiber, plant stanol/sterol esters, sugar, and the components of a well-balanced, healthy diet.   Nutrition II class: Lifestyle Skills:  -Group instruction provided by PowerPoint slides, verbal discussion, and written materials to support subject matter. The instructor gives an explanation and review of label reading, grocery shopping for heart health, heart healthy recipe modifications, and ways to make healthier choices when eating out.   Diabetes Question & Answer:  -Group instruction provided by PowerPoint slides, verbal discussion, and written materials to support subject matter. The instructor gives an explanation and review of diabetes co-morbidities, pre- and post-prandial blood glucose goals, pre-exercise blood glucose goals, signs, symptoms, and treatment of hypoglycemia and hyperglycemia, and foot care basics.   Diabetes Blitz:  -Group instruction provided by PowerPoint slides, verbal discussion, and written materials to support subject matter. The instructor gives an explanation and review of the physiology behind type 1 and type 2 diabetes, diabetes medications and rational behind using different medications, pre- and post-prandial blood glucose recommendations and Hemoglobin A1c goals, diabetes diet, and exercise including blood glucose guidelines for exercising safely.    Portion Distortion:  -Group instruction provided by PowerPoint slides, verbal discussion, written materials, and food models to support  subject matter. The instructor gives an explanation of serving size versus portion size, changes in portions sizes over the last 20 years, and what consists of a serving from each food group.   Stress Management:  -Group instruction provided by verbal instruction, video, and written materials to support subject matter.  Instructors review role of stress in heart disease and how to cope with stress positively.     Exercising on Your Own:  -Group instruction provided by verbal instruction, power point, and written materials to support subject.  Instructors discuss benefits of exercise, components of exercise, frequency and intensity of exercise, and end points for exercise.  Also discuss use of nitroglycerin and activating EMS.  Review options of places to exercise outside of rehab.  Review guidelines for sex with heart disease.   Cardiac Drugs I:  -Group instruction provided by verbal instruction and written materials to support subject.  Instructor reviews cardiac drug classes: antiplatelets, anticoagulants, beta blockers, and statins.  Instructor discusses reasons, side effects, and lifestyle considerations for each drug class.   Cardiac Drugs II:  -Group instruction provided by verbal instruction and written materials to support subject.  Instructor reviews cardiac drug classes: angiotensin converting enzyme inhibitors (ACE-I), angiotensin II receptor blockers (ARBs), nitrates, and calcium channel blockers.  Instructor discusses reasons, side effects, and lifestyle considerations for each drug class.   Anatomy and Physiology of the Circulatory System:  -Group instruction provided by verbal instruction, video, and written materials to support subject.  Reviews functional anatomy of heart, how it relates to various diagnoses, and what role the heart plays in the overall system.   Knowledge Questionnaire Score:     Knowledge Questionnaire Score - 07/14/16 1342    Knowledge Questionnaire  Score   Pre Score 17/24      Core Components/Risk Factors/Patient Goals at Admission:     Personal Goals and Risk Factors at Admission - 07/14/16 1416    Core Components/Risk  Factors/Patient Goals on Admission    Weight Management Weight Loss;Yes   Intervention Weight Management: Develop a combined nutrition and exercise program designed to reach desired caloric intake, while maintaining appropriate intake of nutrient and fiber, sodium and fats, and appropriate energy expenditure required for the weight goal.;Weight Management: Provide education and appropriate resources to help participant work on and attain dietary goals.;Weight Management/Obesity: Establish reasonable short term and long term weight goals.   Expected Outcomes Short Term: Continue to assess and modify interventions until short term weight is achieved;Long Term: Adherence to nutrition and physical activity/exercise program aimed toward attainment of established weight goal;Weight Maintenance: Understanding of the daily nutrition guidelines, which includes 25-35% calories from fat, 7% or less cal from saturated fats, less than 200mg  cholesterol, less than 1.5gm of sodium, & 5 or more servings of fruits and vegetables daily;Weight Loss: Understanding of general recommendations for a balanced deficit meal plan, which promotes 1-2 lb weight loss per week and includes a negative energy balance of [phone] kcal/d;Understanding recommendations for meals to include 15-35% energy as protein, 25-35% energy from fat, 35-60% energy from carbohydrates, less than 200mg  of dietary cholesterol, 20-35 gm of total fiber daily;Understanding of distribution of calorie intake throughout the day with the consumption of 4-5 meals/snacks      Core Components/Risk Factors/Patient Goals Review:    Core Components/Risk Factors/Patient Goals at Discharge (Final Review):    ITP Comments:     ITP Comments      07/14/16 0828 07/14/16 1621          ITP Comments Dr. Fransico Him immediately available as the medical director Dr. Fransico Him, Medical Director         Comments: Patient attended orientation from 0800 to 1000 to review rules and guidelines for program. Completed 6 minute walk test, Intitial ITP, and exercise prescription.  VSS. Telemetry-Sinus Rhythm with a bundle branch block with a downward QRS.  Asymptomatic. Mr Nesler was noted to have an slightly unsteady gait after walking 3 laps on the track. Mr Billick said he has had 3 back surgeries in the past and this is a normal gait for him. Patient was given a wheelchair to use for stability during his walk test.Mr Stanaland gait improved with the use of a wheelchair. Mr Mailhot denied having any symptoms during his walk test. Will continue to monitor the patient throughout  the program.Maria Venetia Maxon, RN,BSN 07/15/2016 11:03 AM

## 2016-07-18 ENCOUNTER — Encounter (HOSPITAL_COMMUNITY)
Admission: RE | Admit: 2016-07-18 | Discharge: 2016-07-18 | Disposition: A | Discharge status: Home / Self Care | Payer: Medicare Other | Source: Ambulatory Visit | Attending: Cardiology | Admitting: Cardiology

## 2016-07-18 DIAGNOSIS — Z951 Presence of aortocoronary bypass graft: Secondary | ICD-10-CM | POA: Diagnosis not present

## 2016-07-18 DIAGNOSIS — E119 Type 2 diabetes mellitus without complications: Secondary | ICD-10-CM | POA: Diagnosis not present

## 2016-07-18 LAB — GLUCOSE, CAPILLARY: Glucose-Capillary: 134 mg/dL — ABNORMAL HIGH (ref 65–99)

## 2016-07-18 NOTE — Progress Notes (Signed)
Pt has seen results on MyChart and message also sent for patient to call back if any questions.

## 2016-07-18 NOTE — Progress Notes (Signed)
Daily Session Note  Patient Details  Name: Jack Wade MRN: [id number] Date of Birth: Feb 08, 1941 Referring Provider:   Flowsheet Row CARDIAC REHAB PHASE II ORIENTATION from 07/14/2016 in [place]  Referring Provider  Kirk Ruths MD      Encounter Date: 07/18/2016  Check In:     Session Check In - 07/18/16 1013      Check-In   Location [facility]   Staff Present Maurice Small, RN, Marga Melnick, RN, Deland Pretty, MS, ACSM CEP, Exercise Physiologist   Supervising physician immediately available to respond to emergencies [facility] immediately available   Physician(s) Dr. Marily Memos   Medication changes reported     No   Fall or balance concerns reported    No   Warm-up and Cool-down Performed as group-led instruction   Resistance Training Performed Yes   VAD Patient? No     Pain Assessment   Currently in Pain? No/denies      Capillary Blood Glucose: Results for orders placed or performed during the hospital encounter of 07/18/16 (from the past 24 hour(s))  Glucose, capillary     Status: Abnormal   Collection Time: 07/18/16 10:38 AM  Result Value Ref Range   Glucose-Capillary 134 (H) 65 - 99 mg/dL     Goals Met:  Exercise tolerated well  Goals Unmet:  Not Applicable  Comments: Pt started cardiac rehab today.  Pt tolerated light exercise without difficulty. VSS, telemetry-Sinus rhythm with a bundle branch block, asymptomatic.  Medication list reconciled. Pt denies barriers to medicaiton compliance.  PSYCHOSOCIAL ASSESSMENT:  PHQ-0. Pt exhibits positive coping skills, hopeful outlook with supportive family. No psychosocial needs identified at this time, no psychosocial interventions necessary.    Pt enjoys Metall[employer] and gardening.   Pt oriented to exercise equipment and routine.    Understanding verbalized. [name]'s resting heart rate was noted at 95 pre exercise and 104 post  exercise. [name]'s maximum heart rate was noted at 143. [name]'s target heart rate upper limit is 118. Will notify Dr Stanford Breed about [name] exceeding his target heart rate. Will continue to monitor the patient throughout  the program.Ankush Gintz Venetia Maxon, RN,BSN 07/18/2016 11:47 AM    Dr. Fransico Him is Medical Director for Cardiac Rehab at [facility].

## 2016-07-19 ENCOUNTER — Ambulatory Visit (INDEPENDENT_AMBULATORY_CARE_PROVIDER_SITE_OTHER): Payer: Medicare Other | Admitting: Medical

## 2016-07-19 ENCOUNTER — Telehealth: Payer: Self-pay | Admitting: Family Medicine

## 2016-07-19 ENCOUNTER — Encounter: Payer: Self-pay | Admitting: Medical

## 2016-07-19 VITALS — BP 116/74 | HR 95 | Temp 98.2°F | Ht 72.0 in | Wt 194.2 lb

## 2016-07-19 DIAGNOSIS — E118 Type 2 diabetes mellitus with unspecified complications: Secondary | ICD-10-CM

## 2016-07-19 DIAGNOSIS — I208 Other forms of angina pectoris: Secondary | ICD-10-CM

## 2016-07-19 DIAGNOSIS — T148 Other injury of unspecified body region: Secondary | ICD-10-CM | POA: Diagnosis not present

## 2016-07-19 DIAGNOSIS — R21 Rash and other nonspecific skin eruption: Secondary | ICD-10-CM | POA: Diagnosis not present

## 2016-07-19 DIAGNOSIS — W57XXXA Bitten or stung by nonvenomous insect and other nonvenomous arthropods, initial encounter: Secondary | ICD-10-CM | POA: Diagnosis not present

## 2016-07-19 LAB — GLUCOSE, CAPILLARY: Glucose-Capillary: 163 mg/dL — ABNORMAL HIGH (ref 65–99)

## 2016-07-19 MED ORDER — GLIPIZIDE 5 MG PO TABS: 10.0000 mg | ORAL_TABLET | Freq: Two times a day (BID) | ORAL | 2 refills | Status: DC

## 2016-07-19 MED ORDER — GLUCOSE BLOOD VI STRP: ORAL_STRIP | 6 refills | Status: DC

## 2016-07-19 MED ORDER — ONETOUCH LANCETS MISC: 6 refills | Status: DC

## 2016-07-19 NOTE — Progress Notes (Signed)
Pre visit review using our clinic review tool, if applicable. No additional management support is needed unless otherwise documented below in the visit note. 

## 2016-07-19 NOTE — Patient Instructions (Addendum)
Your tick bite studies were negative and you did take the doxycycline. Doxycycline covers both lyme and rmsf in event timing of test resulted in negative antibody results.  The rash  left upper chest did eventually resolve some features of possible bulls eye vs atypical fungal infection. Use of lotrimin otc may have helped resolve in event was fungal infection.  Your recent fasting blood sugars and post meal sugars are fairly well controlled considering your age and goal of a1-c of 7. Repeat a1-c on or after 08-16-2016.(90 days past most recent 05-16-2016). We need to balance trying to get you close to 7 a1-c and not giving meds that can effect kidney function. Your currenlty have borderline low gfr which is common in your age group. Will also get cmp when a1c drawn.  Follow up early September with Dr. Charlett Blake or as already scheduled with her.(as needed before then as well).

## 2016-07-19 NOTE — Progress Notes (Signed)
Subjective:    Patient ID: Jack Wade, male    DOB: Jun 28, 1941, 75 y.o.   MRN: [id number]  HPI   Pt in for follow up on rash that pt thought may have been tick bite vs fungal infection.   Pt was given doxy. He states a lot of GI symptoms. Upset stomach mild and nausea but not on now. Pt tick bite studies came back negative.  Pt had been on lotrimin. The rash is resolved for most part and not itching.  Pt also has checked his sugars recently. Usually 125 in am fasting and post meal and most time post meal about 180. One time was 200. Pt is both on metformin and glucotrol. Pt last a1c on 05-16-2016 was 7.7.       Review of Systems  Constitutional: Positive for fatigue. Negative for chills and fever.       Post cardiac surgery still feeling mild fatigue. He has started cardiac rehab.  Respiratory: Negative for cough, chest tightness, shortness of breath and wheezing.   Cardiovascular: Negative for chest pain and palpitations.  Gastrointestinal: Negative for abdominal distention and anal bleeding.  Skin: Positive for rash.       Much better since last visit.  Hematological: Negative for adenopathy. Does not bruise/bleed easily.  Psychiatric/Behavioral: Negative for behavioral problems and confusion.    Past Medical History:  Diagnosis Date  . Aortic calcification ([place]) 02/09/2015  . Appendicitis   . Back pain 02/09/2015  . CAD (coronary artery disease)    2 stents  . Chicken pox   . Diabetes mellitus type 2 in obese ([place]) 04/11/2016  . Diabetes type 2, controlled ([place])   . Dysphagia, unspecified(787.20) 03/20/2014  . Hereditary and idiopathic peripheral neuropathy 02/09/2015  . Hyperlipidemia   . Hypertension   . Hypothyroidism   . Measles   . Medicare annual wellness visit, subsequent 06/14/2015  . Melanoma ([place])    Scalp. 2012  . Mumps as a child  . Thyroid disease    [place]oming Hyperthyroidism     Social History   Social History  . Marital status: Married    Spouse name: N/A  . Number of children: 2  . Years of education: N/A   Occupational History  . Not on file.   Social History Main Topics  . Smoking status: Former Smoker    Packs/day: 2.00    Years: 30.00    Types: Cigarettes    Start date: 12/27/1983  . Smokeless tobacco: Never Used  . Alcohol use 0.0 oz/week     Comment: occasionally  . Drug use: No  . 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    Other Topics Concern  . Not on file   Social History Narrative  . No narrative on file    Past Surgical History:  Procedure Laterality Date  . APPENDECTOMY    . BACK SURGERY     x2 lumbar  . BACK SURGERY     late 1990s had 2 surgeries, first surgery lifted a heavy engine ruptured disds, at L4 and L5, cleaned discs no hardware very helpful. 2 years later while recovering from angiogram with weights applied to left leg caused a recurrence and required surgery again in same area with disc repair  . CARDIAC CATHETERIZATION    . CARDIAC CATHETERIZATION N/A 05/11/2016   Procedure: Left Heart Cath and Coronary Angiography;  Surgeon: Belva Crome, MD;  Location: [facility];  Service: Cardiovascular;  Laterality: N/A;  . CARDIAC CATHETERIZATION N/A 05/11/2016   Procedure: Intravascular Pressure Wire/FFR Study;  Surgeon: Belva Crome, MD;  Location: [facility];  Service: Cardiovascular;  Laterality: N/A;  . CORONARY ANGIOPLASTY WITH STENT PLACEMENT     2 stents  . CORONARY ARTERY BYPASS GRAFT N/A 05/17/2016   Procedure: CORONARY ARTERY BYPASS GRAFTING (CABG) x 6 using left mammory artery and right greater saphenous vein harvested endoscopically. [place] to LAD, SVG to Diagonal, SVG Sequential to OM2 and ramus intermediate, SVG Sequential to RCA and PDA;  Surgeon: [name], MD;  Location: [facility];  Service: Open Heart Surgery;  Laterality: N/A;  . KNEE ARTHROSCOPY     right knee  . LUMBAR LAMINECTOMY/DECOMPRESSION  MICRODISCECTOMY Bilateral 04/10/2015   Procedure: LUMBAR LAMINECTOMY/DECOMPRESSION MICRODISCECTOMY 2 LEVELS;  Surgeon: Ashok Pall, MD;  Location: [facility];  Service: Neurosurgery;  Laterality: Bilateral;  Bilateral L45 L5S1 Laminectomy and Foraminotomy  . ROTATOR CUFF REPAIR Right   . SKIN CANCER EXCISION     melanoma on scalp  . TEE WITHOUT CARDIOVERSION N/A 05/17/2016   Procedure: TRANSESOPHAGEAL ECHOCARDIOGRAM (TEE);  Surgeon: [name], MD;  Location: [facility];  Service: Open Heart Surgery;  Laterality: N/A;  . TONSILLECTOMY AND ADENOIDECTOMY    . WISDOM TOOTH EXTRACTION      Family History  Problem Relation Age of Onset  . Heart disease Father   . Emphysema Father   . Hypertension Father   . Cancer Father     lung/ healthy  . Heart disease Mother     Deceased  . Cancer Mother     breast  . Hypertension Mother   . Cancer Maternal Grandmother     colon  . Diabetes Brother     type 2  . Heart disease Brother   . Pulmonary embolism Son     vasculiti    No Known Allergies  Current Outpatient Prescriptions on File Prior to Visit  Medication Sig Dispense Refill  . aspirin 81 MG tablet Take 81 mg by mouth daily.    Marland Kitchen glucose blood (ONETOUCH VERIO) test strip Use as directed once daily to check blood sugar.  DX E11.8 (Patient taking differently: Use as directed twice daily to check blood sugar.  DX E11.8) 100 each 6  . levothyroxine (SYNTHROID, LEVOTHROID) 100 MCG tablet TAKE 1 TABLET (100 MCG TOTAL) BY MOUTH DAILY BEFORE BREAKFAST. 90 tablet 1  . metFORMIN (GLUCOPHAGE-XR) 500 MG 24 hr tablet Take 2 tablets (1,000 mg total) by mouth 2 (two) times daily. 360 tablet 2  . ONE TOUCH LANCETS MISC Use as directed once daily to check blood sugar.  DX E11.8 (Patient taking differently: Use as directed twice daily to check blood sugar.  DX E11.8) 200 each 6  . rosuvastatin (CRESTOR) 40 MG tablet Take 1 tablet (40 mg total) by mouth daily. 90 tablet 3   No current  facility-administered medications on file prior to visit.     BP 116/74 (BP Location: Right Arm, Patient Position: Sitting, Cuff Size: Normal)   Pulse 95   Temp 98.2 F (36.8 C) (Oral)   Ht 6' (1.829 m)   Wt 194 lb 3.2 oz (88.1 kg)   SpO2 97%   BMI 26.34 kg/m       Objective:   Physical Exam  General Mental Status- Alert. General Appearance- Not in acute distress.   Skin General: Color- Normal Color. Moisture- Normal Moisture. Lt upper chest  rash has cleared.   Neck Carotid Arteries- Normal color. Moisture- Normal Moisture. No carotid bruits. No JVD.  Chest and Lung Exam Auscultation: Breath Sounds:-Normal.  Cardiovascular Auscultation:Rythm- Regular. Murmurs & Other Heart Sounds:Auscultation of the heart reveals- No Murmurs.  Abdomen Inspection:-Inspeection Normal. Palpation/Percussion:Note:No mass. Palpation and Percussion of the abdomen reveal- Non Tender, Non Distended + BS, no rebound or guarding.    Neurologic Cranial Nerve exam:- CN III-XII intact(No nystagmus), symmetric smile. Strength:- 5/5 equal and symmetric strength both upper and lower extremities.      Assessment & Plan:  Your tick bite studies were negative and you did take the doxycycline. Doxycycline covers both lyme and rmsf in event timing of test resulted in negative antibody results.  The rash  left upper chest did eventually resolve some features of possible bulls eye vs atypical fungal infection. Use of lotrimin otc may have helped resolve in event was fungal infection.  Your recent fasting blood sugars and post meal sugars are fairly well controlled considering your age and goal of a1-c of 7. Repeat a1-c on or after 08-16-2016.(90 days past most recent 05-16-2016). We need to balance trying to get you close to 7 a1-c and not giving meds that can effect kidney function. Your currenlty have borderline low gfr which is common in your age group. Will also get cmp when a1c drawn.  Follow up  early September with Dr. Charlett Blake or as already scheduled with her.(as needed before then as well).  Ronia Hazelett, Percell Miller, PA-C

## 2016-07-19 NOTE — Telephone Encounter (Signed)
Updated strips and lancets prescription to test twice daily. Sent in new prescriptions

## 2016-07-19 NOTE — Telephone Encounter (Signed)
Caller name: Lerry Liner from [place] [phone]  [id number] - [place], [place] [phone] (Phone) [phone] (Fax)      Reason for call:  Pharmacy in need of clarification regarding direction for ONE TOUCH LANCETS MISC states.

## 2016-07-20 ENCOUNTER — Encounter (HOSPITAL_COMMUNITY)
Admission: RE | Admit: 2016-07-20 | Discharge: 2016-07-20 | Disposition: A | Discharge status: Home / Self Care | Payer: Medicare Other | Source: Ambulatory Visit | Attending: Cardiology | Admitting: Cardiology

## 2016-07-20 DIAGNOSIS — Z951 Presence of aortocoronary bypass graft: Secondary | ICD-10-CM | POA: Diagnosis not present

## 2016-07-20 DIAGNOSIS — E119 Type 2 diabetes mellitus without complications: Secondary | ICD-10-CM | POA: Diagnosis not present

## 2016-07-20 LAB — GLUCOSE, CAPILLARY
Glucose-Capillary: 140 mg/dL — ABNORMAL HIGH (ref 65–99)
Glucose-Capillary: 151 mg/dL — ABNORMAL HIGH (ref 65–99)

## 2016-07-22 ENCOUNTER — Encounter (HOSPITAL_COMMUNITY)
Admission: RE | Admit: 2016-07-22 | Discharge: 2016-07-22 | Disposition: A | Discharge status: Home / Self Care | Payer: Medicare Other | Source: Ambulatory Visit | Attending: Cardiology | Admitting: Cardiology

## 2016-07-22 DIAGNOSIS — Z951 Presence of aortocoronary bypass graft: Secondary | ICD-10-CM

## 2016-07-22 DIAGNOSIS — E119 Type 2 diabetes mellitus without complications: Secondary | ICD-10-CM | POA: Diagnosis not present

## 2016-07-22 LAB — GLUCOSE, CAPILLARY
Glucose-Capillary: 174 mg/dL — ABNORMAL HIGH (ref 65–99)
Glucose-Capillary: 103 mg/dL — ABNORMAL HIGH (ref 65–99)

## 2016-07-25 ENCOUNTER — Encounter (HOSPITAL_COMMUNITY)
Admission: RE | Admit: 2016-07-25 | Discharge: 2016-07-25 | Disposition: A | Discharge status: Home / Self Care | Payer: Medicare Other | Source: Ambulatory Visit | Attending: Cardiology | Admitting: Cardiology

## 2016-07-25 DIAGNOSIS — Z951 Presence of aortocoronary bypass graft: Secondary | ICD-10-CM | POA: Diagnosis not present

## 2016-07-25 DIAGNOSIS — E119 Type 2 diabetes mellitus without complications: Secondary | ICD-10-CM | POA: Diagnosis not present

## 2016-07-25 LAB — GLUCOSE, CAPILLARY: Glucose-Capillary: 193 mg/dL — ABNORMAL HIGH (ref 65–99)

## 2016-07-27 ENCOUNTER — Encounter (HOSPITAL_COMMUNITY)
Admission: RE | Admit: 2016-07-27 | Discharge: 2016-07-27 | Disposition: A | Discharge status: Home / Self Care | Payer: Medicare Other | Source: Ambulatory Visit | Attending: Cardiology | Admitting: Cardiology

## 2016-07-27 DIAGNOSIS — Z951 Presence of aortocoronary bypass graft: Secondary | ICD-10-CM | POA: Insufficient documentation

## 2016-07-27 DIAGNOSIS — E119 Type 2 diabetes mellitus without complications: Secondary | ICD-10-CM | POA: Diagnosis not present

## 2016-07-27 LAB — GLUCOSE, CAPILLARY: Glucose-Capillary: 159 mg/dL — ABNORMAL HIGH (ref 65–99)

## 2016-07-27 NOTE — Progress Notes (Signed)
Reviewed home exercise guidelines with patient including endpoints, temperature precautions, target heart rate and rate of perceived exertion. Pt is walking 20 minutes daily as his mode of home exercise. Encouraged pt to increase duration to 30 minutes by adding 1-2 minutes each session and patient is amenable to this. Pt voices understanding of instructions given. Sol Passer, MS, ACSM CCEP

## 2016-07-29 ENCOUNTER — Encounter (HOSPITAL_COMMUNITY)
Admission: RE | Admit: 2016-07-29 | Discharge: 2016-07-29 | Disposition: A | Discharge status: Home / Self Care | Payer: Medicare Other | Source: Ambulatory Visit | Attending: Cardiology | Admitting: Cardiology

## 2016-07-29 DIAGNOSIS — E119 Type 2 diabetes mellitus without complications: Secondary | ICD-10-CM | POA: Diagnosis not present

## 2016-07-29 DIAGNOSIS — Z951 Presence of aortocoronary bypass graft: Secondary | ICD-10-CM

## 2016-07-29 LAB — GLUCOSE, CAPILLARY: Glucose-Capillary: 192 mg/dL — ABNORMAL HIGH (ref 65–99)

## 2016-08-01 ENCOUNTER — Encounter (HOSPITAL_COMMUNITY)
Admission: RE | Admit: 2016-08-01 | Discharge: 2016-08-01 | Disposition: A | Discharge status: Home / Self Care | Payer: Medicare Other | Source: Ambulatory Visit | Attending: Cardiology | Admitting: Cardiology

## 2016-08-01 DIAGNOSIS — Z951 Presence of aortocoronary bypass graft: Secondary | ICD-10-CM | POA: Diagnosis not present

## 2016-08-01 DIAGNOSIS — E119 Type 2 diabetes mellitus without complications: Secondary | ICD-10-CM | POA: Diagnosis not present

## 2016-08-02 ENCOUNTER — Telehealth: Payer: Self-pay | Admitting: Cardiology

## 2016-08-02 NOTE — Telephone Encounter (Signed)
New message       Calling to see if you received fax regarding patient's heart rate.  Flow sheet was faxed on 7-24.  Verdis Frederickson will fax flow sheet again, please call

## 2016-08-03 ENCOUNTER — Encounter (HOSPITAL_COMMUNITY)
Admission: RE | Admit: 2016-08-03 | Discharge: 2016-08-03 | Disposition: A | Discharge status: Home / Self Care | Payer: Medicare Other | Source: Ambulatory Visit | Attending: Cardiology | Admitting: Cardiology

## 2016-08-03 DIAGNOSIS — Z951 Presence of aortocoronary bypass graft: Secondary | ICD-10-CM

## 2016-08-03 DIAGNOSIS — E119 Type 2 diabetes mellitus without complications: Secondary | ICD-10-CM | POA: Diagnosis not present

## 2016-08-04 ENCOUNTER — Telehealth: Payer: Self-pay | Admitting: *Deleted

## 2016-08-04 MED ORDER — METOPROLOL SUCCINATE ER 25 MG PO TB24: 12.5000 mg | ORAL_TABLET | Freq: Every day | ORAL | 6 refills | Status: DC

## 2016-08-04 NOTE — Telephone Encounter (Signed)
See telephone encounter from 08/04/16.

## 2016-08-04 NOTE — Telephone Encounter (Signed)
Spoke with pt wife, due to elevated heart rates at cardiac rehab per dr Stanford Breed the pt is to start metoprolol 12.5 mg once daily at bedtime. He will call if he develops dizzines or other problems.

## 2016-08-05 ENCOUNTER — Encounter (HOSPITAL_COMMUNITY)
Admission: RE | Admit: 2016-08-05 | Discharge: 2016-08-05 | Disposition: A | Discharge status: Home / Self Care | Payer: Medicare Other | Source: Ambulatory Visit | Attending: Cardiology | Admitting: Cardiology

## 2016-08-05 ENCOUNTER — Ambulatory Visit: Payer: Medicare Other | Admitting: Cardiology

## 2016-08-05 DIAGNOSIS — Z951 Presence of aortocoronary bypass graft: Secondary | ICD-10-CM | POA: Diagnosis not present

## 2016-08-05 DIAGNOSIS — E119 Type 2 diabetes mellitus without complications: Secondary | ICD-10-CM | POA: Diagnosis not present

## 2016-08-05 NOTE — Progress Notes (Signed)
Dr Stanford Breed started Jack Wade on Toprol XL 12.5 once a day @[facility] . Will continue to monitor heart rate and blood pressure.Barnet Pall, RN,BSN 08/05/2016 10:46 AM

## 2016-08-08 ENCOUNTER — Encounter (HOSPITAL_COMMUNITY)
Admission: RE | Admit: 2016-08-08 | Discharge: 2016-08-08 | Disposition: A | Discharge status: Home / Self Care | Payer: Medicare Other | Source: Ambulatory Visit | Attending: Cardiology | Admitting: Cardiology

## 2016-08-08 DIAGNOSIS — Z951 Presence of aortocoronary bypass graft: Secondary | ICD-10-CM | POA: Diagnosis not present

## 2016-08-08 DIAGNOSIS — E119 Type 2 diabetes mellitus without complications: Secondary | ICD-10-CM | POA: Diagnosis not present

## 2016-08-08 NOTE — Progress Notes (Signed)
[name] 75 y.o. male Nutrition Note Spoke with pt. Nutrition Plan and Nutrition Survey goals reviewed with pt. Pt is following Step 2 of the Therapeutic Lifestyle Changes diet. Pt is diabetic. Last A1c indicates blood glucose not well-controlled. Pt checks CBG's 2-3 times a day "since this heart thing started." Fasting CBG this am reportedly 95 mg/dL. Pre-exercise/post-prandial CBG 154 mg/dL. Pt stated CBG's "are pretty typical." Pt expressed he "doesn't really get why I need to take my blood sugar or what my blood sugar means." Recommended fasting and post-prandial CBG ranges discussed. The role pt's DM meds play in controlling CBG's reviewed. This Probation officer went over Diabetes Education test results. Pt expressed understanding of the information reviewed. Pt aware of nutrition education classes offered.  Lab Results  Component Value Date   HGBA1C 7.7 (H) 05/16/2016   Wt Readings from Last 3 Encounters:  07/19/16 194 lb 3.2 oz (88.1 kg)  07/14/16 193 lb 2 oz (87.6 kg)  07/08/16 193 lb 9.6 oz (87.8 kg)    Nutrition Diagnosis ? Food-and nutrition-related knowledge deficit related to lack of exposure to information as related to diagnosis of: ? CVD ? DM  Nutrition Intervention ? Pt's individual nutrition plan reviewed with pt. ? Benefits of adopting Therapeutic Lifestyle Changes discussed when Medficts reviewed. ? Pt to attend the Portion Distortion class ? Pt to attend the Diabetes Q & A class ? Pt to attend the   ? Nutrition I class                  ? Nutrition II class     ? Diabetes Blitz class    ? Continue client-centered nutrition education by RD, as part of interdisciplinary care. Goal(s) ? CBG concentrations in the normal range or as close to normal as is safely possible. Monitor and Evaluate progress toward nutrition goal with team. Derek Mound, M.Ed, RD, LDN, CDE 08/08/2016 11:17 AM

## 2016-08-10 ENCOUNTER — Encounter (HOSPITAL_COMMUNITY)
Admission: RE | Admit: 2016-08-10 | Discharge: 2016-08-10 | Disposition: A | Discharge status: Home / Self Care | Payer: Medicare Other | Source: Ambulatory Visit | Attending: Cardiology | Admitting: Cardiology

## 2016-08-10 DIAGNOSIS — E119 Type 2 diabetes mellitus without complications: Secondary | ICD-10-CM | POA: Diagnosis not present

## 2016-08-10 DIAGNOSIS — Z951 Presence of aortocoronary bypass graft: Secondary | ICD-10-CM | POA: Diagnosis not present

## 2016-08-11 ENCOUNTER — Encounter: Payer: Self-pay | Admitting: Cardiothoracic Surgery

## 2016-08-11 ENCOUNTER — Encounter: Payer: Self-pay | Admitting: Cardiology

## 2016-08-11 ENCOUNTER — Ambulatory Visit (INDEPENDENT_AMBULATORY_CARE_PROVIDER_SITE_OTHER): Payer: Self-pay | Admitting: Cardiothoracic Surgery

## 2016-08-11 ENCOUNTER — Ambulatory Visit: Payer: Medicare Other

## 2016-08-11 VITALS — BP 108/68 | HR 72 | Resp 16 | Ht 72.0 in | Wt 192.0 lb

## 2016-08-11 DIAGNOSIS — I2510 Atherosclerotic heart disease of native coronary artery without angina pectoris: Secondary | ICD-10-CM

## 2016-08-11 DIAGNOSIS — Z951 Presence of aortocoronary bypass graft: Secondary | ICD-10-CM

## 2016-08-11 NOTE — Progress Notes (Signed)
[address]       [place] [address]             [phone]      Lucille C Resnick [facility] Medical Record [id number] Date of Birth: May 03, 1941  Referring: Belva Crome, MD Primary Care: Penni Homans, MD  Cardiology:Dr Crenshaw   Chief Complaint:   POST OP FOLLOW UP DATE OF PROCEDURE: 05/17/2016 POSTOPERATIVE DIAGNOSIS: Coronary occlusive disease. POSTOPERATIVE DIAGNOSIS: Coronary occlusive disease. SURGICAL PROCEDURE: Coronary artery bypass grafting x6 with left internal mammary to left anterior descending coronary artery, reverse saphenous vein graft to the 2nd diagonal, sequential reverse saphenous vein graft to the intermediate and distal circumflex, sequential reverse saphenous vein graft to the distal right coronary artery and mid posterior descending coronary artery with right leg greater saphenous endoscopic vein harvest, right thigh and calf. SURGEON: Lanelle Bal, M.D.  History of Present Illness:     Now almost 3 months postoperative. He's currently involved with cardiac rehabilitation and increasing his activity appropriately. He's been started back on his beta blocker which had been held because of low blood pressure previously.    Patient returns office today 3 month following coronary artery bypass grafting.  Past Medical History:  Diagnosis Date  . Aortic calcification ([place]) 02/09/2015  . Appendicitis   . Back pain 02/09/2015  . CAD (coronary artery disease)    2 stents  . Chicken pox   . Diabetes mellitus type 2 in obese ([place]) 04/11/2016  . Diabetes type 2, controlled ([place])   . Dysphagia, unspecified(787.20) 03/20/2014  . Hereditary and idiopathic peripheral neuropathy 02/09/2015  . Hyperlipidemia   . Hypertension   . Hypothyroidism   . Measles   . Medicare annual wellness visit, subsequent 06/14/2015  . Melanoma ([place])    Scalp. 2012  . Mumps as a child  . Thyroid disease    [place] Hyperthyroidism      History  Smoking Status  . Former Smoker  . Packs/day: 2.00  . Years: 30.00  . Types: Cigarettes  . Start date: 12/27/1983  Smokeless Tobacco  . Never Used    History  Alcohol Use  . 0.0 oz/week    Comment: occasionally     No Known Allergies  Current Outpatient Prescriptions  Medication Sig Dispense Refill  . aspirin 81 MG tablet Take 81 mg by mouth daily.    Marland Kitchen glipiZIDE (GLUCOTROL) 5 MG tablet Take 2 tablets (10 mg total) by mouth 2 (two) times daily before a meal. 360 tablet 2  . glucose blood (ONETOUCH VERIO) test strip Use as directed twice daily to check blood sugar.  DX E11.8 100 each 6  . levothyroxine (SYNTHROID, LEVOTHROID) 100 MCG tablet TAKE 1 TABLET (100 MCG TOTAL) BY MOUTH DAILY BEFORE BREAKFAST. 90 tablet 1  . metFORMIN (GLUCOPHAGE-XR) 500 MG 24 hr tablet Take 2 tablets (1,000 mg total) by mouth 2 (two) times daily. 360 tablet 2  . metoprolol succinate (TOPROL XL) 25 MG 24 hr tablet Take 0.5 tablets (12.5 mg total) by mouth daily. 15 tablet 6  . ONE TOUCH LANCETS MISC Use as directed twice daily to check blood sugar.  DX E11.8 200 each 6  . rosuvastatin (CRESTOR) 40 MG tablet Take 1 tablet (40 mg total) by mouth daily. 90 tablet 3   No current facility-administered medications for this visit.        Physical Exam: BP 108/68   Pulse 72   Resp 16   Ht 6' (  1.829 m)   Wt 192 lb (87.1 kg)   SpO2 98% Comment: ON RA  BMI 26.04 kg/m  100/66 standing  General appearance: alert, cooperative, appears stated age and no distress Neurologic: intact Heart: regular rate and rhythm, S1, S2 normal, no murmur, click, rub or gallop Lungs: clear to auscultation bilaterally Abdomen: soft, non-tender; bowel sounds normal; no masses,  no organomegaly Extremities: extremities normal, atraumatic, no cyanosis or edema and Homans sign is negative, no sign of DVT Wound: Sternum is stable and well-healed there is no evidence of infection or drainage.   Diagnostic  Studies & Laboratory data:     Recent Radiology Findings:   No results found.    Recent Lab Findings: Lab Results  Component Value Date   WBC 11.6 (H) 05/27/2016   HGB 8.8 (L) 05/27/2016   HCT 28.3 (L) 05/27/2016   PLT 493 (H) 05/27/2016   GLUCOSE 125 (H) 07/08/2016   CHOL 122 04/11/2016   TRIG 308.0 (H) 04/11/2016   HDL 39.30 04/11/2016   LDLDIRECT 43.0 04/11/2016   LDLCALC 45 12/11/2015   ALT 11 07/08/2016   AST 18 07/08/2016   NA 138 07/08/2016   K 4.0 07/08/2016   CL 104 07/08/2016   CREATININE 1.35 07/08/2016   BUN 16 07/08/2016   CO2 25 07/08/2016   TSH 3.31 04/11/2016   INR 1.61 (H) 05/17/2016   HGBA1C 7.7 (H) 05/16/2016      Assessment / Plan:   Stable now almost 3 months post coronary artery bypass grafting On aspirin and statin and beta blocker- has cardiology appointment next week Patient was noted to have trivial aortic insufficiency and very mildly dilated aortic root preoperatively I've not made a return appointment for in the semi-but be glad to see him in his or Dr. Jacalyn Lefevre requested anytime.    [name] MD      [address] [place] [address] Office [phone]   Beeper [phone]  08/11/2016 9:40 AM

## 2016-08-11 NOTE — Progress Notes (Signed)
Cardiac Individual Treatment Plan  Patient Details  Name: Jack Wade MRN: [id number] Date of Birth: [birth year] Referring Provider:   Flowsheet Row CARDIAC REHAB PHASE II ORIENTATION from 07/14/2016 in [place]  Referring Provider  Kirk Ruths MD      Initial Encounter Date:  [facility] PHASE II ORIENTATION from 07/14/2016 in [place] [facility] [place]  Date  07/14/16  Referring Provider  Kirk Ruths MD      Visit Diagnosis: 05/17/16 S/P CABG x 6  Patient's Home Medications on Admission:  Current Outpatient Prescriptions:  .  aspirin 81 MG tablet, Take 81 mg by mouth daily., Disp: , Rfl:  .  glipiZIDE (GLUCOTROL) 5 MG tablet, Take 2 tablets (10 mg total) by mouth 2 (two) times daily before a meal., Disp: 360 tablet, Rfl: 2 .  glucose blood (ONETOUCH VERIO) test strip, Use as directed twice daily to check blood sugar.  DX E11.8, Disp: 100 each, Rfl: 6 .  levothyroxine (SYNTHROID, LEVOTHROID) 100 MCG tablet, TAKE 1 TABLET (100 MCG TOTAL) BY MOUTH DAILY BEFORE BREAKFAST., Disp: 90 tablet, Rfl: 1 .  metFORMIN (GLUCOPHAGE-XR) 500 MG 24 hr tablet, Take 2 tablets (1,000 mg total) by mouth 2 (two) times daily., Disp: 360 tablet, Rfl: 2 .  metoprolol succinate (TOPROL XL) 25 MG 24 hr tablet, Take 0.5 tablets (12.5 mg total) by mouth daily., Disp: 15 tablet, Rfl: 6 .  ONE TOUCH LANCETS MISC, Use as directed twice daily to check blood sugar.  DX E11.8, Disp: 200 each, Rfl: 6 .  rosuvastatin (CRESTOR) 40 MG tablet, Take 1 tablet (40 mg total) by mouth daily., Disp: 90 tablet, Rfl: 3  Past Medical History: Past Medical History:  Diagnosis Date  . Aortic calcification ([place]) 02/09/2015  . Appendicitis   . Back pain 02/09/2015  . CAD (coronary artery disease)    2 stents  . Chicken pox   . Diabetes mellitus type 2 in obese ([place]) 04/11/2016  . Diabetes type 2, controlled ([place])   . Dysphagia, unspecified(787.20)  03/20/2014  . Hereditary and idiopathic peripheral neuropathy 02/09/2015  . Hyperlipidemia   . Hypertension   . Hypothyroidism   . Measles   . Medicare annual wellness visit, subsequent 06/14/2015  . Melanoma ([place])    Scalp. 2012  . Mumps as a child  . Thyroid disease    [place] Hyperthyroidism    Tobacco Use: History  Smoking Status  . Former Smoker  . Packs/day: 2.00  . Years: 30.00  . Types: Cigarettes  . Start date: 12/27/1983  Smokeless Tobacco  . Never Used    Labs: Recent Review Flowsheet Data    Labs for ITP Cardiac and Pulmonary Rehab Latest Ref Rng & Units 05/17/2016 05/17/2016 05/17/2016 05/17/2016 05/18/2016   Cholestrol 0 - 200 mg/dL - - - - -   LDLCALC 0 - 99 mg/dL - - - - -   LDLDIRECT mg/dL - - - - -   HDL >39.00 mg/dL - - - - -   Trlycerides 0.0 - 149.0 mg/dL - - - - -   Hemoglobin A1c 4.8 - 5.6 % - - - - -   PHART 7.350 - 7.450 7.346(L) - 7.404 7.418 -   PCO2ART 35.0 - 45.0 mmHg 37.7 - 40.3 36.2 -   HCO3 20.0 - 24.0 mEq/L 20.9 - 24.9(H) 23.1 -   TCO2 0 - 100 mmol/L 22 24 26 24 21    ACIDBASEDEF 0.0 - 2.0 mmol/L 5.0(H) - -  1.0 -   O2SAT % 97.0 - 99.0 97.0 -      Capillary Blood Glucose: Lab Results  Component Value Date   GLUCAP 192 (H) 07/29/2016   GLUCAP 159 (H) 07/27/2016   GLUCAP 193 (H) 07/25/2016   GLUCAP 103 (H) 07/22/2016   GLUCAP 174 (H) 07/22/2016     Exercise Target Goals:    Exercise Program Goal: Individual exercise prescription set with THRR, safety & activity barriers. Participant demonstrates ability to understand and report RPE using BORG scale, to self-measure pulse accurately, and to acknowledge the importance of the exercise prescription.  Exercise Prescription Goal: Starting with aerobic activity 30 plus minutes a day, 3 days per week for initial exercise prescription. Provide home exercise prescription and guidelines that participant acknowledges understanding prior to discharge.  Activity Barriers & Risk Stratification:      Activity Barriers & Cardiac Risk Stratification - 07/14/16 0829      Activity Barriers & Cardiac Risk Stratification   Activity Barriers Back Problems;Other (comment)   Comments L leg is weaker than R, unable to stand on toes on L leg,  3 back surgeries in the past   Cardiac Risk Stratification High      6 Minute Walk:     6 Minute Walk    Row Name 07/14/16 1343         6 Minute Walk   Phase Initial     Distance 1230 feet     Walk Time 6 minutes     # of Rest Breaks 0     MPH 2.3     METS 2.8     RPE 11     VO2 Peak 9.8     Symptoms No     Resting HR 96 bpm     Resting BP 102/62     Max Ex. HR 133 bpm     Max Ex. BP 104/70     2 Minute Post BP 228/74        Initial Exercise Prescription:     Initial Exercise Prescription - 07/14/16 1300      Date of Initial Exercise RX and Referring Provider   Date 07/14/16   Referring Provider Kirk Ruths MD     Recumbant Bike   Level 2   Minutes 10   METs 2     NuStep   Level 3   Minutes 10   METs 2     Track   Laps 8   Minutes 10   METs 2.74     Prescription Details   Frequency (times per week) 3   Duration Progress to 30 minutes of continuous aerobic without signs/symptoms of physical distress     Intensity   THRR 40-80% of Max Heartrate 59-118   Ratings of Perceived Exertion 11-13   Perceived Dyspnea 0-4     Progression   Progression Continue to progress workloads to maintain intensity without signs/symptoms of physical distress.     Resistance Training   Training Prescription Yes   Weight 2lb   Reps 10-12      Perform Capillary Blood Glucose checks as needed.  Exercise Prescription Changes:      Exercise Prescription Changes    Row Name 07/25/16 1700 08/12/16 0800           Exercise Review   Progression Yes Yes        Response to Exercise   Blood Pressure (Admit) 92/72 100/60      Blood Pressure (Exercise)  138/76 150/70      Blood Pressure (Exit) 112/70 118/72      Heart Rate  (Admit) 90 bpm 83 bpm      Heart Rate (Exercise) 136 bpm 127 bpm      Heart Rate (Exit) 100 bpm 91 bpm      Rating of Perceived Exertion (Exercise) 11 11      Symptoms none none      Comments  - Reviewed home exercise guidelines on 07/27/16.      Duration Progress to 30 minutes of continuous aerobic without signs/symptoms of physical distress Progress to 30 minutes of continuous aerobic without signs/symptoms of physical distress      Intensity THRR unchanged THRR unchanged        Progression   Progression Continue to progress workloads to maintain intensity without signs/symptoms of physical distress. Continue to progress workloads to maintain intensity without signs/symptoms of physical distress.      Average METs 3.8 3.9        Resistance Training   Training Prescription Yes Yes      Weight 2lb 2lb      Reps 10-12 10-12        Interval Training   Interval Training No No        Recumbant Bike   Level 3 3      Watts  - 50      Minutes 10 10      METs 2 3.75        NuStep   Level 3 4      Minutes 10 10      METs 3.8 4.3        Arm Ergometer   Level  - 4      Minutes  - 10      METs  - 2.5        Track   Laps 13 16      Minutes 10 10      METs 3.26 3.79        Home Exercise Plan   Plans to continue exercise at  - Home      Frequency  - Add 4 additional days to program exercise sessions.         Exercise Comments:      Exercise Comments    Row Name 07/25/16 1748 08/08/16 1621         Exercise Comments Off to a good start with exerciase. Doing well with exercise. Pt walking ~1.2 miles 2 days per week outside of cardiac rehab. Pt would like to strengthen upper body. Will switch from recumbent bike to upper body arm ergometer at cardiac rehab.         Discharge Exercise Prescription (Final Exercise Prescription Changes):     Exercise Prescription Changes - 08/12/16 0800      Exercise Review   Progression Yes     Response to Exercise   Blood Pressure  (Admit) 100/60   Blood Pressure (Exercise) 150/70   Blood Pressure (Exit) 118/72   Heart Rate (Admit) 83 bpm   Heart Rate (Exercise) 127 bpm   Heart Rate (Exit) 91 bpm   Rating of Perceived Exertion (Exercise) 11   Symptoms none   Comments Reviewed home exercise guidelines on 07/27/16.   Duration Progress to 30 minutes of continuous aerobic without signs/symptoms of physical distress   Intensity THRR unchanged     Progression   Progression Continue to progress workloads to maintain intensity without signs/symptoms of  physical distress.   Average METs 3.9     Resistance Training   Training Prescription Yes   Weight 2lb   Reps 10-12     Interval Training   Interval Training No     Recumbant Bike   Level 3   Watts 50   Minutes 10   METs 3.75     NuStep   Level 4   Minutes 10   METs 4.3     Arm Ergometer   Level 4   Minutes 10   METs 2.5     Track   Laps 16   Minutes 10   METs 3.79     Home Exercise Plan   Plans to continue exercise at Home   Frequency Add 4 additional days to program exercise sessions.      Nutrition:  Target Goals: Understanding of nutrition guidelines, daily intake of sodium 1500mg , cholesterol 200mg , calories 30% from fat and 7% or less from saturated fats, daily to have 5 or more servings of fruits and vegetables.  Biometrics:     Pre Biometrics - 07/14/16 1346      Pre Biometrics   Waist Circumference 38.75 inches   Hip Circumference 41 inches   Waist to Hip Ratio 0.95 %   Triceps Skinfold 16 mm   % Body Fat 26.6 %   Grip Strength 42 kg   Flexibility 0 in   Single Leg Stand 24.94 seconds       Nutrition Therapy Plan and Nutrition Goals:     Nutrition Therapy & Goals - 07/21/16 1443      Nutrition Therapy   Diet Carb Modified, Therapeutic Lifestyle Change     Personal Nutrition Goals   Personal Goal #1 Maintain wt while in Cardiac Rehab   Personal Goal #2 Improved glycemic control as evidenced by an improved A1c to a  goal of 7.0 or less     Intervention Plan   Intervention Prescribe, educate and counsel regarding individualized specific dietary modifications aiming towards targeted core components such as weight, hypertension, lipid management, diabetes, heart failure and other comorbidities.   Expected Outcomes Short Term Goal: Understand basic principles of dietary content, such as calories, fat, sodium, cholesterol and nutrients.;Long Term Goal: Adherence to prescribed nutrition plan.      Nutrition Discharge: Nutrition Scores:     Nutrition Assessments - 07/21/16 1442      MEDFICTS Scores   Pre Score 36  will verify score with pt      Nutrition Goals Re-Evaluation:   Psychosocial: Target Goals: Acknowledge presence or absence of depression, maximize coping skills, provide positive support system. Participant is able to verbalize types and ability to use techniques and skills needed for reducing stress and depression.  Initial Review & Psychosocial Screening:     Initial Psych Review & Screening - 07/15/16 [place]? Yes     Barriers   Psychosocial barriers to participate in program There are no identifiable barriers or psychosocial needs.;The patient should benefit from training in stress management and relaxation.     Screening Interventions   Interventions Encouraged to exercise      Quality of Life Scores:     Quality of Life - 07/14/16 1350      Quality of Life Scores   Health/Function Pre 28.04 %   Socioeconomic Pre 27.17 %   Psych/Spiritual Pre 26.14 %   Family Pre 30 %   GLOBAL Pre  27.76 %      PHQ-9: Recent Review Flowsheet Data    Depression screen [facility] 2/9 07/18/2016 07/14/2015   Decreased Interest 0 0   Down, Depressed, Hopeless 0 0   PHQ - 2 Score 0 0      Psychosocial Evaluation and Intervention:   Psychosocial Re-Evaluation:     Psychosocial Re-Evaluation    Row Name 08/11/16 0932              Psychosocial Re-Evaluation   Interventions Encouraged to attend Cardiac Rehabilitation for the exercise       Continued Psychosocial Services Needed No          Vocational Rehabilitation: Provide vocational rehab assistance to qualifying candidates.   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 Participant will state understanding/return demonstration of topics presented.  Learning Barriers/Preferences:     Learning Barriers/Preferences - 07/14/16 0830      Learning Barriers/Preferences   Learning Barriers Sight   Learning Preferences Skilled Demonstration      Education Topics: Count Your Pulse:  -Group instruction provided by verbal instruction, demonstration, patient participation and written materials to support subject.  Instructors address importance of being able to find your pulse and how to count your pulse when at home without a heart monitor.  Patients get hands on experience counting their pulse with staff help and individually.   Heart Attack, Angina, and Risk Factor Modification:  -Group instruction provided by verbal instruction, video, and written materials to support subject.  Instructors address signs and symptoms of angina and heart attacks.    Also discuss risk factors for heart disease and how to make changes to improve heart health risk factors.   Functional Fitness:  -Group instruction provided by verbal instruction, demonstration, patient participation, and written materials to support subject.  Instructors address safety measures for doing things around the house.  Discuss how  to get up and down off the floor, how to pick things up properly, how to safely get out of a chair without assistance, and balance training.   Meditation and Mindfulness:  -Group instruction provided by verbal instruction, patient participation, and written materials to support subject.  Instructor addresses importance of mindfulness and meditation practice to help reduce stress and improve awareness.  Instructor also leads participants through a meditation exercise.    Stretching for Flexibility and Mobility:  -Group instruction provided by verbal instruction, patient participation, and written materials to support subject.  Instructors lead participants through series of stretches that are designed to increase flexibility thus improving mobility.  These stretches are additional exercise for major muscle groups that are typically performed during regular warm up and cool down.   Hands Only CPR Anytime:  -Group instruction provided by verbal instruction, video, patient participation and written materials to support subject.  Instructors co-teach with AHA video for hands only CPR.  Participants get hands on experience with mannequins.   Nutrition I class: Heart Healthy Eating:  -Group instruction provided by PowerPoint slides, verbal discussion, and written materials to support subject matter. The instructor gives an explanation and review of the Therapeutic Lifestyle Changes diet  recommendations, which includes a discussion on lipid goals, dietary fat, sodium, fiber, plant stanol/sterol esters, sugar, and the components of a well-balanced, healthy diet.   Nutrition II class: Lifestyle Skills:  -Group instruction provided by PowerPoint slides, verbal discussion, and written materials to support subject matter. The instructor gives an explanation and review of label reading, grocery shopping for heart health, heart healthy recipe modifications, and ways to make healthier choices when eating  out.   Diabetes Question & Answer:  -Group instruction provided by PowerPoint slides, verbal discussion, and written materials to support subject matter. The instructor gives an explanation and review of diabetes co-morbidities, pre- and post-prandial blood glucose goals, pre-exercise blood glucose goals, signs, symptoms, and treatment of hypoglycemia and hyperglycemia, and foot care basics. Flowsheet Row CARDIAC REHAB PHASE II EXERCISE from 08/10/2016 in [place]  Date  08/05/16  Educator  RD  Instruction Review Code  2- meets goals/outcomes      Diabetes Blitz:  -Group instruction provided by PowerPoint slides, verbal discussion, and written materials to support subject matter. The instructor gives an explanation and review of the physiology behind type 1 and type 2 diabetes, diabetes medications and rational behind using different medications, pre- and post-prandial blood glucose recommendations and Hemoglobin A1c goals, diabetes diet, and exercise including blood glucose guidelines for exercising safely.    Portion Distortion:  -Group instruction provided by PowerPoint slides, verbal discussion, written materials, and food models to support subject matter. The instructor gives an explanation of serving size versus portion size, changes in portions sizes over the last 20 years, and what consists of a serving from each food group. Flowsheet Row CARDIAC REHAB PHASE II EXERCISE from 08/10/2016 in [place]  Date  08/03/16  Educator  RD  Instruction Review Code  2- meets goals/outcomes      Stress Management:  -Group instruction provided by verbal instruction, video, and written materials to support subject matter.  Instructors review role of stress in heart disease and how to cope with stress positively.     Exercising on Your Own:  -Group instruction provided by verbal instruction, power point, and written materials to  support subject.  Instructors discuss benefits of exercise, components of exercise, frequency and intensity of exercise, and end points for exercise.  Also discuss use of nitroglycerin and activating EMS.  Review options of places to exercise outside of rehab.  Review guidelines for sex with heart disease.   Cardiac Drugs I:  -Group instruction provided by verbal instruction and written materials to support subject.  Instructor reviews cardiac drug classes: antiplatelets, anticoagulants, beta blockers, and statins.  Instructor discusses reasons, side effects, and lifestyle considerations for each drug class. Flowsheet Row CARDIAC REHAB PHASE II EXERCISE from 08/10/2016 in [place]  Date  08/10/16  Educator  pharmD   Instruction Review Code  2- meets goals/outcomes      Cardiac Drugs II:  -Group instruction provided by verbal instruction and written materials to support subject.  Instructor reviews cardiac drug classes: angiotensin converting enzyme inhibitors (ACE-I), angiotensin II receptor blockers (ARBs), nitrates, and calcium channel blockers.  Instructor discusses reasons, side effects, and lifestyle considerations for each drug class.   Anatomy and Physiology of the Circulatory System:  -Group instruction provided by verbal instruction, video, and written materials to support subject.  Reviews functional anatomy of heart, how it relates to various diagnoses, and what role the heart plays in the overall system.  Knowledge Questionnaire Score:     Knowledge Questionnaire Score - 07/14/16 1342      Knowledge Questionnaire Score   Pre Score 17/24      Core Components/Risk Factors/Patient Goals at Admission:     Personal Goals and Risk Factors at Admission - 07/14/16 1416      Core Components/Risk Factors/Patient Goals on Admission    Weight Management Weight Loss;Yes   Intervention Weight Management: Develop a combined nutrition and exercise  program designed to reach desired caloric intake, while maintaining appropriate intake of nutrient and fiber, sodium and fats, and appropriate energy expenditure required for the weight goal.;Weight Management: Provide education and appropriate resources to help participant work on and attain dietary goals.;Weight Management/Obesity: Establish reasonable short term and long term weight goals.   Expected Outcomes Short Term: Continue to assess and modify interventions until short term weight is achieved;Long Term: Adherence to nutrition and physical activity/exercise program aimed toward attainment of established weight goal;Weight Maintenance: Understanding of the daily nutrition guidelines, which includes 25-35% calories from fat, 7% or less cal from saturated fats, less than 200mg  cholesterol, less than 1.5gm of sodium, & 5 or more servings of fruits and vegetables daily;Weight Loss: Understanding of general recommendations for a balanced deficit meal plan, which promotes 1-2 lb weight loss per week and includes a negative energy balance of [phone] kcal/d;Understanding recommendations for meals to include 15-35% energy as protein, 25-35% energy from fat, 35-60% energy from carbohydrates, less than 200mg  of dietary cholesterol, 20-35 gm of total fiber daily;Understanding of distribution of calorie intake throughout the day with the consumption of 4-5 meals/snacks      Core Components/Risk Factors/Patient Goals Review:      Goals and Risk Factor Review    Row Name 08/08/16 1623             Core Components/Risk Factors/Patient Goals Review   Personal Goals Review Other       Review Will switch from recumbent bike to upper body arm ergometer to help patient achieve goal of increasing upper body strength.       Expected Outcomes Achieve increase in upper body strength and cardiorespiratory fitness through regular aerobic and resistance training program.          Core Components/Risk  Factors/Patient Goals at Discharge (Final Review):      Goals and Risk Factor Review - 08/08/16 1623      Core Components/Risk Factors/Patient Goals Review   Personal Goals Review Other   Review Will switch from recumbent bike to upper body arm ergometer to help patient achieve goal of increasing upper body strength.   Expected Outcomes Achieve increase in upper body strength and cardiorespiratory fitness through regular aerobic and resistance training program.      ITP Comments:     ITP Comments    Row Name 07/14/16 0828 07/14/16 1621         ITP Comments Dr. Fransico Him immediately available as the medical director Dr. Fransico Him, Medical Director         Comments: Bynum Bellows is making expected progress toward personal goals after completing 11sessions. Recommend continued exercise and life style modification education including  stress management and relaxation techniques to decrease cardiac risk profile. Barnet Pall, RN,BSN 08/12/2016 9:56 AM

## 2016-08-12 ENCOUNTER — Encounter (HOSPITAL_COMMUNITY)
Admission: RE | Admit: 2016-08-12 | Discharge: 2016-08-12 | Disposition: A | Discharge status: Home / Self Care | Payer: Medicare Other | Source: Ambulatory Visit | Attending: Cardiology | Admitting: Cardiology

## 2016-08-12 DIAGNOSIS — E119 Type 2 diabetes mellitus without complications: Secondary | ICD-10-CM | POA: Diagnosis not present

## 2016-08-12 DIAGNOSIS — Z951 Presence of aortocoronary bypass graft: Secondary | ICD-10-CM

## 2016-08-15 ENCOUNTER — Encounter (HOSPITAL_COMMUNITY)
Admission: RE | Admit: 2016-08-15 | Discharge: 2016-08-15 | Disposition: A | Discharge status: Home / Self Care | Payer: Medicare Other | Source: Ambulatory Visit | Attending: Cardiology | Admitting: Cardiology

## 2016-08-15 DIAGNOSIS — Z951 Presence of aortocoronary bypass graft: Secondary | ICD-10-CM

## 2016-08-15 DIAGNOSIS — E119 Type 2 diabetes mellitus without complications: Secondary | ICD-10-CM | POA: Diagnosis not present

## 2016-08-17 ENCOUNTER — Encounter (HOSPITAL_COMMUNITY)
Admission: RE | Admit: 2016-08-17 | Discharge: 2016-08-17 | Disposition: A | Discharge status: Home / Self Care | Payer: Medicare Other | Source: Ambulatory Visit | Attending: Cardiology | Admitting: Cardiology

## 2016-08-17 DIAGNOSIS — Z951 Presence of aortocoronary bypass graft: Secondary | ICD-10-CM | POA: Diagnosis not present

## 2016-08-17 DIAGNOSIS — E119 Type 2 diabetes mellitus without complications: Secondary | ICD-10-CM | POA: Diagnosis not present

## 2016-08-19 ENCOUNTER — Encounter (HOSPITAL_COMMUNITY)
Admission: RE | Admit: 2016-08-19 | Discharge: 2016-08-19 | Disposition: A | Discharge status: Home / Self Care | Payer: Medicare Other | Source: Ambulatory Visit | Attending: Cardiology | Admitting: Cardiology

## 2016-08-19 DIAGNOSIS — E119 Type 2 diabetes mellitus without complications: Secondary | ICD-10-CM | POA: Diagnosis not present

## 2016-08-19 DIAGNOSIS — Z951 Presence of aortocoronary bypass graft: Secondary | ICD-10-CM | POA: Diagnosis not present

## 2016-08-19 LAB — GLUCOSE, CAPILLARY: Glucose-Capillary: 157 mg/dL — ABNORMAL HIGH (ref 65–99)

## 2016-08-22 ENCOUNTER — Encounter (HOSPITAL_COMMUNITY)
Admission: RE | Admit: 2016-08-22 | Discharge: 2016-08-22 | Disposition: A | Discharge status: Home / Self Care | Payer: Medicare Other | Source: Ambulatory Visit | Attending: Cardiology | Admitting: Cardiology

## 2016-08-22 DIAGNOSIS — Z951 Presence of aortocoronary bypass graft: Secondary | ICD-10-CM | POA: Diagnosis not present

## 2016-08-22 DIAGNOSIS — E119 Type 2 diabetes mellitus without complications: Secondary | ICD-10-CM | POA: Diagnosis not present

## 2016-08-22 NOTE — Progress Notes (Signed)
HPI: FU coronary artery disease. Lower extremity Dopplers February 2016 showed an occluded right anterior tibial artery. Abdominal ultrasound February 2016 showed no aneurysm. Patient had cardiac catheterization May 2017 secondary to continued exertional chest pain. He was found to have severe three-vessel coronary artery disease and normal LV function. Echocardiogram showed normal LV systolic function, grade 1 diastolic dysfunction and mildly dilated aortic root. Note carotid Dopplers May 2017 Prior to surgery showed 1-39% bilateral stenosis. Patient had coronary artery bypass and graft With LIMA to the LAD, saphenous vein graft to second diagonal, sequential saphenous vein graft to the intermediate and distal circumflex, and sequential saphenous vein graft to the right coronary artery and PDA. Since he was last seen, the patient has dyspnea with more extreme activities but not with routine activities. It is relieved with rest. It is not associated with chest pain. There is no orthopnea, PND or pedal edema. There is no syncope or palpitations. There is no exertional chest pain.   Current Outpatient Prescriptions  Medication Sig Dispense Refill  . aspirin 81 MG tablet Take 81 mg by mouth daily.    Marland Kitchen glipiZIDE (GLUCOTROL) 5 MG tablet Take 2 tablets (10 mg total) by mouth 2 (two) times daily before a meal. 360 tablet 2  . glucose blood (ONETOUCH VERIO) test strip Use as directed twice daily to check blood sugar.  DX E11.8 100 each 6  . levothyroxine (SYNTHROID, LEVOTHROID) 100 MCG tablet TAKE 1 TABLET (100 MCG TOTAL) BY MOUTH DAILY BEFORE BREAKFAST. 90 tablet 1  . metFORMIN (GLUCOPHAGE-XR) 500 MG 24 hr tablet Take 2 tablets (1,000 mg total) by mouth 2 (two) times daily. 360 tablet 2  . metoprolol succinate (TOPROL XL) 25 MG 24 hr tablet Take 0.5 tablets (12.5 mg total) by mouth daily. 15 tablet 6  . ONE TOUCH LANCETS MISC Use as directed twice daily to check blood sugar.  DX E11.8 200 each 6  .  rosuvastatin (CRESTOR) 40 MG tablet Take 1 tablet (40 mg total) by mouth daily. 90 tablet 3   No current facility-administered medications for this visit.      Past Medical History:  Diagnosis Date  . Aortic calcification ([place]) 02/09/2015  . Appendicitis   . Back pain 02/09/2015  . CAD (coronary artery disease)    2 stents  . Chicken pox   . Diabetes mellitus type 2 in obese ([place]) 04/11/2016  . Diabetes type 2, controlled ([place])   . Dysphagia, unspecified(787.20) 03/20/2014  . Hereditary and idiopathic peripheral neuropathy 02/09/2015  . Hyperlipidemia   . Hypertension   . Hypothyroidism   . Measles   . Medicare annual wellness visit, subsequent 06/14/2015  . Melanoma ([place])    Scalp. 2012  . Mumps as a child  . Thyroid disease    [place] Hyperthyroidism    Past Surgical History:  Procedure Laterality Date  . APPENDECTOMY    . BACK SURGERY     x2 lumbar  . BACK SURGERY     late 1990s had 2 surgeries, first surgery lifted a heavy engine ruptured disds, at L4 and L5, cleaned discs no hardware very helpful. 2 years later while recovering from angiogram with weights applied to left leg caused a recurrence and required surgery again in same area with disc repair  . CARDIAC CATHETERIZATION    . CARDIAC CATHETERIZATION N/A 05/11/2016   Procedure: Left Heart Cath and Coronary Angiography;  Surgeon: Belva Crome, MD;  Location: [facility];  Service:  Cardiovascular;  Laterality: N/A;  . CARDIAC CATHETERIZATION N/A 05/11/2016   Procedure: Intravascular Pressure Wire/FFR Study;  Surgeon: Belva Crome, MD;  Location: [facility];  Service: Cardiovascular;  Laterality: N/A;  . CORONARY ANGIOPLASTY WITH STENT PLACEMENT     2 stents  . CORONARY ARTERY BYPASS GRAFT N/A 05/17/2016   Procedure: CORONARY ARTERY BYPASS GRAFTING (CABG) x 6 using left mammory artery and right greater saphenous vein harvested endoscopically. Hauser to LAD, SVG to Diagonal, SVG Sequential to OM2 and ramus  intermediate, SVG Sequential to RCA and PDA;  Surgeon: [name], MD;  Location: [facility];  Service: Open Heart Surgery;  Laterality: N/A;  . KNEE ARTHROSCOPY     right knee  . LUMBAR LAMINECTOMY/DECOMPRESSION MICRODISCECTOMY Bilateral 04/10/2015   Procedure: LUMBAR LAMINECTOMY/DECOMPRESSION MICRODISCECTOMY 2 LEVELS;  Surgeon: Ashok Pall, MD;  Location: [facility];  Service: Neurosurgery;  Laterality: Bilateral;  Bilateral L45 L5S1 Laminectomy and Foraminotomy  . ROTATOR CUFF REPAIR Right   . SKIN CANCER EXCISION     melanoma on scalp  . TEE WITHOUT CARDIOVERSION N/A 05/17/2016   Procedure: TRANSESOPHAGEAL ECHOCARDIOGRAM (TEE);  Surgeon: [name], MD;  Location: [facility];  Service: Open Heart Surgery;  Laterality: N/A;  . TONSILLECTOMY AND ADENOIDECTOMY    . WISDOM TOOTH EXTRACTION      Social History   Social History  . Marital status: Married    Spouse name: N/A  . Number of children: 2  . Years of education: N/A   Occupational History  . Not on file.   Social History Main Topics  . Smoking status: Former Smoker    Packs/day: 2.00    Years: 30.00    Types: Cigarettes    Start date: 12/27/1983  . Smokeless tobacco: Never Used  . Alcohol use 0.0 oz/week     Comment: occasionally  . Drug use: No  . 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    Other Topics Concern  . Not on file   Social History Narrative  . No narrative on file    Family History  Problem Relation Age of Onset  . Heart disease Father   . Emphysema Father   . Hypertension Father   . Cancer Father     lung/ healthy  . Heart disease Mother     Deceased  . Cancer Mother     breast  . Hypertension Mother   . Cancer Maternal Grandmother     colon  . Diabetes Brother     type 2  . Heart disease Brother   . Pulmonary embolism Son     vasculiti    ROS: no fevers or chills, productive cough, hemoptysis, dysphasia, odynophagia,  melena, hematochezia, dysuria, hematuria, rash, seizure activity, orthopnea, PND, pedal edema, claudication. Remaining systems are negative.  Physical Exam: Well-developed well-nourished in no acute distress.  Skin is warm and dry.  HEENT is normal.  Neck is supple.  Chest is clear to auscultation with normal expansion.  Cardiovascular exam is regular rate and rhythm.  Abdominal exam nontender or distended. No masses palpated. Extremities show no edema. neuro grossly intact  A/P  1 Coronary artery disease status post coronary artery bypass graft-patient doing well following recent surgery. Continue aspirin and statin.  2 hyperlipidemia-continue statin. Check lipids and liver.  3 hypertension-continue metoprolol. His heart rate is apparently running high during cardiac rehabilitation. I cannot advance metoprolol as his blood pressure is  borderline.  4 peripheral vascular disease-continue aspirin and statin. Patient not having claudication.  Kirk Ruths, MD

## 2016-08-24 ENCOUNTER — Encounter (HOSPITAL_COMMUNITY)
Admission: RE | Admit: 2016-08-24 | Discharge: 2016-08-24 | Disposition: A | Discharge status: Home / Self Care | Payer: Medicare Other | Source: Ambulatory Visit | Attending: Cardiology | Admitting: Cardiology

## 2016-08-24 ENCOUNTER — Ambulatory Visit (INDEPENDENT_AMBULATORY_CARE_PROVIDER_SITE_OTHER): Payer: Medicare Other | Admitting: Cardiology

## 2016-08-24 ENCOUNTER — Encounter: Payer: Self-pay | Admitting: Cardiology

## 2016-08-24 VITALS — BP 110/72 | HR 73 | Ht 72.0 in | Wt 196.8 lb

## 2016-08-24 DIAGNOSIS — I208 Other forms of angina pectoris: Secondary | ICD-10-CM | POA: Diagnosis not present

## 2016-08-24 DIAGNOSIS — Z79899 Other long term (current) drug therapy: Secondary | ICD-10-CM

## 2016-08-24 DIAGNOSIS — Z951 Presence of aortocoronary bypass graft: Secondary | ICD-10-CM

## 2016-08-24 DIAGNOSIS — E119 Type 2 diabetes mellitus without complications: Secondary | ICD-10-CM | POA: Diagnosis not present

## 2016-08-24 DIAGNOSIS — E785 Hyperlipidemia, unspecified: Secondary | ICD-10-CM

## 2016-08-24 NOTE — Patient Instructions (Signed)
Your physician recommends that you continue on your current medications as directed. Please refer to the Current Medication list given to you today.   Your physician wants you to follow-up in:   [place] will receive a reminder letter in the mail two months in advance. If you don't receive a letter, please call our office to schedule the follow-up appointment. Your physician recommends that you return for lab work in:  LIPID LIVER

## 2016-08-25 ENCOUNTER — Encounter: Payer: Self-pay | Admitting: Cardiology

## 2016-08-25 ENCOUNTER — Other Ambulatory Visit (INDEPENDENT_AMBULATORY_CARE_PROVIDER_SITE_OTHER): Payer: Medicare Other

## 2016-08-25 DIAGNOSIS — E785 Hyperlipidemia, unspecified: Secondary | ICD-10-CM

## 2016-08-25 DIAGNOSIS — Z79899 Other long term (current) drug therapy: Secondary | ICD-10-CM

## 2016-08-25 LAB — LIPID PANEL
Cholesterol: 73 mg/dL — ABNORMAL LOW (ref 125–200)
HDL: 42 mg/dL (ref >=40)
LDL Cholesterol: 12 mg/dL (ref <130)
Total CHOL/HDL Ratio: 1.7 Ratio (ref <=5.0)
Triglycerides: 93 mg/dL (ref <150)
VLDL: 19 mg/dL (ref <30)

## 2016-08-25 LAB — HEPATIC FUNCTION PANEL
ALT: 12 U/L (ref 9–46)
AST: 18 U/L (ref 10–35)
Albumin: 4.2 g/dL (ref 3.6–5.1)
Alkaline Phosphatase: 52 U/L (ref 40–115)
Bilirubin, Direct: 0.1 mg/dL (ref <=0.2)
Indirect Bilirubin: 0.3 mg/dL (ref 0.2–1.2)
Total Bilirubin: 0.4 mg/dL (ref 0.2–1.2)
Total Protein: 6.9 g/dL (ref 6.1–8.1)

## 2016-08-26 ENCOUNTER — Encounter (HOSPITAL_COMMUNITY)
Admission: RE | Admit: 2016-08-26 | Discharge: 2016-08-26 | Disposition: A | Discharge status: Home / Self Care | Payer: Medicare Other | Source: Ambulatory Visit | Attending: Cardiology | Admitting: Cardiology

## 2016-08-26 ENCOUNTER — Other Ambulatory Visit: Payer: Self-pay | Admitting: Family Medicine

## 2016-08-26 DIAGNOSIS — Z951 Presence of aortocoronary bypass graft: Secondary | ICD-10-CM | POA: Diagnosis not present

## 2016-08-26 DIAGNOSIS — E119 Type 2 diabetes mellitus without complications: Secondary | ICD-10-CM | POA: Insufficient documentation

## 2016-08-30 ENCOUNTER — Encounter: Payer: Self-pay | Admitting: Family Medicine

## 2016-08-30 ENCOUNTER — Ambulatory Visit (INDEPENDENT_AMBULATORY_CARE_PROVIDER_SITE_OTHER): Payer: Medicare Other | Admitting: Family Medicine

## 2016-08-30 VITALS — BP 108/68 | HR 71 | Temp 97.7°F | Ht 72.0 in | Wt 196.5 lb

## 2016-08-30 DIAGNOSIS — Z23 Encounter for immunization: Secondary | ICD-10-CM | POA: Diagnosis not present

## 2016-08-30 DIAGNOSIS — Z951 Presence of aortocoronary bypass graft: Secondary | ICD-10-CM | POA: Diagnosis not present

## 2016-08-30 DIAGNOSIS — I208 Other forms of angina pectoris: Secondary | ICD-10-CM | POA: Diagnosis not present

## 2016-08-30 DIAGNOSIS — E118 Type 2 diabetes mellitus with unspecified complications: Secondary | ICD-10-CM | POA: Diagnosis not present

## 2016-08-30 DIAGNOSIS — I10 Essential (primary) hypertension: Secondary | ICD-10-CM

## 2016-08-30 DIAGNOSIS — E038 Other specified hypothyroidism: Secondary | ICD-10-CM

## 2016-08-30 DIAGNOSIS — E785 Hyperlipidemia, unspecified: Secondary | ICD-10-CM

## 2016-08-30 LAB — HEMOGLOBIN A1C: Hgb A1c MFr Bld: 7.1 % — ABNORMAL HIGH (ref 4.6–6.5)

## 2016-08-30 MED ORDER — GLUCOSE BLOOD VI STRP: ORAL_STRIP | 3 refills | Status: DC

## 2016-08-30 MED ORDER — ONETOUCH LANCETS MISC: 3 refills | Status: DC

## 2016-08-30 NOTE — Progress Notes (Signed)
Pre visit review using our clinic review tool, if applicable. No additional management support is needed unless otherwise documented below in the visit note. 

## 2016-08-30 NOTE — Patient Instructions (Signed)
DASH Eating Plan  DASH stands for "Dietary Approaches to Stop Hypertension." The DASH eating plan is a healthy eating plan that has been shown to reduce high blood pressure (hypertension). Additional health benefits may include reducing the risk of type 2 diabetes mellitus, heart disease, and stroke. The DASH eating plan may also help with weight loss.  WHAT DO I NEED TO KNOW ABOUT THE DASH EATING PLAN?  For the DASH eating plan, you will follow these general guidelines:  · Choose foods with a percent daily value for sodium of less than 5% (as listed on the food label).  · Use salt-free seasonings or herbs instead of table salt or sea salt.  · Check with your health care provider or pharmacist before using salt substitutes.  · Eat lower-sodium products, often labeled as "lower sodium" or "no salt added."  · Eat fresh foods.  · Eat more vegetables, fruits, and low-fat dairy products.  · Choose whole grains. Look for the word "whole" as the first word in the ingredient list.  · Choose fish and skinless chicken or turkey more often than red meat. Limit fish, poultry, and meat to 6 oz (170 g) each day.  · Limit sweets, desserts, sugars, and sugary drinks.  · Choose heart-healthy fats.  · Limit cheese to 1 oz (28 g) per day.  · Eat more home-cooked food and less restaurant, buffet, and fast food.  · Limit fried foods.  · Cook foods using methods other than frying.  · Limit canned vegetables. If you do use them, rinse them well to decrease the sodium.  · When eating at a restaurant, ask that your food be prepared with less salt, or no salt if possible.  WHAT FOODS CAN I EAT?  Seek help from a dietitian for individual calorie needs.  Grains  Whole grain or whole wheat bread. Brown rice. Whole grain or whole wheat pasta. Quinoa, bulgur, and whole grain cereals. Low-sodium cereals. Corn or whole wheat flour tortillas. Whole grain cornbread. Whole grain crackers. Low-sodium crackers.  Vegetables  Fresh or frozen vegetables  (raw, steamed, roasted, or grilled). Low-sodium or reduced-sodium tomato and vegetable juices. Low-sodium or reduced-sodium tomato sauce and paste. Low-sodium or reduced-sodium canned vegetables.   Fruits  All fresh, canned (in natural juice), or frozen fruits.  Meat and Other Protein Products  Ground beef (85% or leaner), grass-fed beef, or beef trimmed of fat. Skinless chicken or turkey. Ground chicken or turkey. Pork trimmed of fat. All fish and seafood. Eggs. Dried beans, peas, or lentils. Unsalted nuts and seeds. Unsalted canned beans.  Dairy  Low-fat dairy products, such as skim or 1% milk, 2% or reduced-fat cheeses, low-fat ricotta or cottage cheese, or plain low-fat yogurt. Low-sodium or reduced-sodium cheeses.  Fats and Oils  Tub margarines without trans fats. Light or reduced-fat mayonnaise and salad dressings (reduced sodium). Avocado. Safflower, olive, or canola oils. Natural peanut or almond butter.  Other  Unsalted popcorn and pretzels.  The items listed above may not be a complete list of recommended foods or beverages. Contact your dietitian for more options.  WHAT FOODS ARE NOT RECOMMENDED?  Grains  White bread. White pasta. White rice. Refined cornbread. Bagels and croissants. Crackers that contain trans fat.  Vegetables  Creamed or fried vegetables. Vegetables in a cheese sauce. Regular canned vegetables. Regular canned tomato sauce and paste. Regular tomato and vegetable juices.  Fruits  Dried fruits. Canned fruit in light or heavy syrup. Fruit juice.  Meat and Other Protein   Products  Fatty cuts of meat. Ribs, chicken wings, bacon, sausage, bologna, salami, chitterlings, fatback, hot dogs, bratwurst, and packaged luncheon meats. Salted nuts and seeds. Canned beans with salt.  Dairy  Whole or 2% milk, cream, half-and-half, and cream cheese. Whole-fat or sweetened yogurt. Full-fat cheeses or blue cheese. Nondairy creamers and whipped toppings. Processed cheese, cheese spreads, or cheese  curds.  Condiments  Onion and garlic salt, seasoned salt, table salt, and sea salt. Canned and packaged gravies. Worcestershire sauce. Tartar sauce. Barbecue sauce. Teriyaki sauce. Soy sauce, including reduced sodium. Steak sauce. Fish sauce. Oyster sauce. Cocktail sauce. Horseradish. Ketchup and mustard. Meat flavorings and tenderizers. Bouillon cubes. Hot sauce. Tabasco sauce. Marinades. Taco seasonings. Relishes.  Fats and Oils  Butter, stick margarine, lard, shortening, ghee, and bacon fat. Coconut, palm kernel, or palm oils. Regular salad dressings.  Other  Pickles and olives. Salted popcorn and pretzels.  The items listed above may not be a complete list of foods and beverages to avoid. Contact your dietitian for more information.  WHERE CAN I FIND MORE INFORMATION?  National Heart, Lung, and [employer] Institute: www.nhlbi.nih.gov/health/health-topics/topics/dash/     This information is not intended to replace advice given to you by your health care provider. Make sure you discuss any questions you have with your health care provider.     Document Released: 12/01/2011 Document Revised: 01/02/2015 Document Reviewed: 10/16/2013  [employer]vier Interactive Patient Education ©2016 [employer]

## 2016-08-31 ENCOUNTER — Encounter (HOSPITAL_COMMUNITY): Payer: Medicare Other

## 2016-08-31 DIAGNOSIS — D485 Neoplasm of uncertain behavior of skin: Secondary | ICD-10-CM | POA: Diagnosis not present

## 2016-08-31 DIAGNOSIS — L814 Other melanin hyperpigmentation: Secondary | ICD-10-CM | POA: Diagnosis not present

## 2016-08-31 DIAGNOSIS — Z85820 Personal history of malignant melanoma of skin: Secondary | ICD-10-CM | POA: Diagnosis not present

## 2016-08-31 DIAGNOSIS — L570 Actinic keratosis: Secondary | ICD-10-CM | POA: Diagnosis not present

## 2016-08-31 DIAGNOSIS — Z08 Encounter for follow-up examination after completed treatment for malignant neoplasm: Secondary | ICD-10-CM | POA: Diagnosis not present

## 2016-09-02 ENCOUNTER — Encounter (HOSPITAL_COMMUNITY)
Admission: RE | Admit: 2016-09-02 | Discharge: 2016-09-02 | Disposition: A | Discharge status: Home / Self Care | Payer: Medicare Other | Source: Ambulatory Visit | Attending: Cardiology | Admitting: Cardiology

## 2016-09-02 DIAGNOSIS — Z951 Presence of aortocoronary bypass graft: Secondary | ICD-10-CM

## 2016-09-02 DIAGNOSIS — E119 Type 2 diabetes mellitus without complications: Secondary | ICD-10-CM | POA: Diagnosis not present

## 2016-09-05 ENCOUNTER — Encounter (HOSPITAL_COMMUNITY)
Admission: RE | Admit: 2016-09-05 | Discharge: 2016-09-05 | Disposition: A | Discharge status: Home / Self Care | Payer: Medicare Other | Source: Ambulatory Visit | Attending: Cardiology | Admitting: Cardiology

## 2016-09-05 DIAGNOSIS — Z951 Presence of aortocoronary bypass graft: Secondary | ICD-10-CM | POA: Diagnosis not present

## 2016-09-05 DIAGNOSIS — E119 Type 2 diabetes mellitus without complications: Secondary | ICD-10-CM | POA: Diagnosis not present

## 2016-09-07 ENCOUNTER — Encounter (HOSPITAL_COMMUNITY)
Admission: RE | Admit: 2016-09-07 | Discharge: 2016-09-07 | Disposition: A | Discharge status: Home / Self Care | Payer: Medicare Other | Source: Ambulatory Visit | Attending: Cardiology | Admitting: Cardiology

## 2016-09-07 DIAGNOSIS — Z951 Presence of aortocoronary bypass graft: Secondary | ICD-10-CM

## 2016-09-07 DIAGNOSIS — E119 Type 2 diabetes mellitus without complications: Secondary | ICD-10-CM | POA: Diagnosis not present

## 2016-09-09 ENCOUNTER — Encounter (HOSPITAL_COMMUNITY)
Admission: RE | Admit: 2016-09-09 | Discharge: 2016-09-09 | Disposition: A | Discharge status: Home / Self Care | Payer: Medicare Other | Source: Ambulatory Visit | Attending: Cardiology | Admitting: Cardiology

## 2016-09-09 DIAGNOSIS — E119 Type 2 diabetes mellitus without complications: Secondary | ICD-10-CM | POA: Diagnosis not present

## 2016-09-09 DIAGNOSIS — Z951 Presence of aortocoronary bypass graft: Secondary | ICD-10-CM | POA: Diagnosis not present

## 2016-09-09 NOTE — Progress Notes (Signed)
Cardiac Individual Treatment Plan  Patient Details  Name: Jack Wade MRN: [id number] Date of Birth: [birth year] Referring Provider:   Flowsheet Row CARDIAC REHAB PHASE II ORIENTATION from 07/14/2016 in [facility]  Referring Provider  Kirk Ruths MD      Initial Encounter Date:  [place] PHASE II ORIENTATION from 07/14/2016 in [facility] [facility]  Date  07/14/16  Referring Provider  Kirk Ruths MD      Visit Diagnosis: 05/17/16 S/P CABG x 6  Patient's Home Medications on Admission:  Current Outpatient Prescriptions:  .  aspirin 81 MG tablet, Take 81 mg by mouth daily., Disp: , Rfl:  .  glipiZIDE (GLUCOTROL) 5 MG tablet, Take 2 tablets (10 mg total) by mouth 2 (two) times daily before a meal., Disp: 360 tablet, Rfl: 2 .  glucose blood (ONETOUCH VERIO) test strip, Use as directed three times daily to check blood sugar.  DX E11.8, Disp: 300 each, Rfl: 3 .  levothyroxine (SYNTHROID, LEVOTHROID) 100 MCG tablet, TAKE 1 TABLET (100 MCG TOTAL) BY MOUTH DAILY BEFORE BREAKFAST., Disp: 90 tablet, Rfl: 1 .  metFORMIN (GLUCOPHAGE-XR) 500 MG 24 hr tablet, Take 2 tablets (1,000 mg total) by mouth 2 (two) times daily., Disp: 360 tablet, Rfl: 2 .  metoprolol succinate (TOPROL XL) 25 MG 24 hr tablet, Take 0.5 tablets (12.5 mg total) by mouth daily., Disp: 15 tablet, Rfl: 6 .  ONE TOUCH LANCETS MISC, Use as directed three times daily to check blood sugar.  DX E11.8, Disp: 300 each, Rfl: 3 .  rosuvastatin (CRESTOR) 40 MG tablet, Take 1 tablet (40 mg total) by mouth daily., Disp: 90 tablet, Rfl: 3  Past Medical History: Past Medical History:  Diagnosis Date  . Aortic calcification ([place]) 02/09/2015  . Appendicitis   . Back pain 02/09/2015  . CAD (coronary artery disease)    2 stents  . Chicken pox   . Diabetes mellitus type 2 in obese ([place]) 04/11/2016  . Diabetes type 2, controlled ([place])   . Dysphagia,  unspecified(787.20) 03/20/2014  . Hereditary and idiopathic peripheral neuropathy 02/09/2015  . Hyperlipidemia   . Hypertension   . Hypothyroidism   . Measles   . Medicare annual wellness visit, subsequent 06/14/2015  . Melanoma ([place])    Scalp. 2012  . Mumps as a child  . Thyroid disease    [place] Hyperthyroidism    Tobacco Use: History  Smoking Status  . Former Smoker  . Packs/day: 2.00  . Years: 30.00  . Types: Cigarettes  . Start date: 12/27/1983  Smokeless Tobacco  . Never Used    Labs: Recent Review Flowsheet Data    Labs for ITP Cardiac and Pulmonary Rehab Latest Ref Rng & Units 05/17/2016 05/17/2016 05/18/2016 08/25/2016 08/30/2016   Cholestrol 125 - 200 mg/dL - - - 73(L) -   LDLCALC <130 mg/dL - - - 12 -   LDLDIRECT mg/dL - - - - -   HDL >=40 mg/dL - - - 42 -   Trlycerides <150 mg/dL - - - 93 -   Hemoglobin A1c 4.6 - 6.5 % - - - - 7.1(H)   PHART 7.350 - 7.450 7.404 7.418 - - -   PCO2ART 35.0 - 45.0 mmHg 40.3 36.2 - - -   HCO3 20.0 - 24.0 mEq/L 24.9(H) 23.1 - - -   TCO2 0 - 100 mmol/L 26 24 21  - -   ACIDBASEDEF 0.0 - 2.0 mmol/L - 1.0 - - -  O2SAT % 99.0 97.0 - - -      Capillary Blood Glucose: Lab Results  Component Value Date   GLUCAP 157 (H) 08/19/2016   GLUCAP 192 (H) 07/29/2016   GLUCAP 159 (H) 07/27/2016   GLUCAP 193 (H) 07/25/2016   GLUCAP 103 (H) 07/22/2016     Exercise Target Goals:    Exercise Program Goal: Individual exercise prescription set with THRR, safety & activity barriers. Participant demonstrates ability to understand and report RPE using BORG scale, to self-measure pulse accurately, and to acknowledge the importance of the exercise prescription.  Exercise Prescription Goal: Starting with aerobic activity 30 plus minutes a day, 3 days per week for initial exercise prescription. Provide home exercise prescription and guidelines that participant acknowledges understanding prior to discharge.  Activity Barriers & Risk Stratification:      Activity Barriers & Cardiac Risk Stratification - 07/14/16 0829      Activity Barriers & Cardiac Risk Stratification   Activity Barriers Back Problems;Other (comment)   Comments L leg is weaker than R, unable to stand on toes on L leg,  3 back surgeries in the past   Cardiac Risk Stratification High      6 Minute Walk:     6 Minute Walk    Row Name 07/14/16 1343         6 Minute Walk   Phase Initial     Distance 1230 feet     Walk Time 6 minutes     # of Rest Breaks 0     MPH 2.3     METS 2.8     RPE 11     VO2 Peak 9.8     Symptoms No     Resting HR 96 bpm     Resting BP 102/62     Max Ex. HR 133 bpm     Max Ex. BP 104/70     2 Minute Post BP 228/74        Initial Exercise Prescription:     Initial Exercise Prescription - 07/14/16 1300      Date of Initial Exercise RX and Referring Provider   Date 07/14/16   Referring Provider Kirk Ruths MD     Recumbant Bike   Level 2   Minutes 10   METs 2     NuStep   Level 3   Minutes 10   METs 2     Track   Laps 8   Minutes 10   METs 2.74     Prescription Details   Frequency (times per week) 3   Duration Progress to 30 minutes of continuous aerobic without signs/symptoms of physical distress     Intensity   THRR 40-80% of Max Heartrate 59-118   Ratings of Perceived Exertion 11-13   Perceived Dyspnea 0-4     Progression   Progression Continue to progress workloads to maintain intensity without signs/symptoms of physical distress.     Resistance Training   Training Prescription Yes   Weight 2lb   Reps 10-12      Perform Capillary Blood Glucose checks as needed.  Exercise Prescription Changes:      Exercise Prescription Changes    Row Name 07/25/16 1700 08/12/16 0800 09/02/16 1600         Exercise Review   Progression Yes Yes Yes       Response to Exercise   Blood Pressure (Admit) 92/72 100/60 118/70     Blood Pressure (Exercise) 138/76 150/70 170/82  Blood Pressure (Exit)  112/70 118/72 110/64     Heart Rate (Admit) 90 bpm 83 bpm 80 bpm     Heart Rate (Exercise) 136 bpm 127 bpm 124 bpm     Heart Rate (Exit) 100 bpm 91 bpm 89 bpm     Rating of Perceived Exertion (Exercise) 11 11 12      Symptoms none none none     Comments  - Reviewed home exercise guidelines on 07/27/16. Reviewed home exercise guidelines on 07/27/16.     Duration Progress to 30 minutes of continuous aerobic without signs/symptoms of physical distress Progress to 30 minutes of continuous aerobic without signs/symptoms of physical distress Progress to 30 minutes of continuous aerobic without signs/symptoms of physical distress     Intensity THRR unchanged THRR unchanged THRR unchanged       Progression   Progression Continue to progress workloads to maintain intensity without signs/symptoms of physical distress. Continue to progress workloads to maintain intensity without signs/symptoms of physical distress. Continue to progress workloads to maintain intensity without signs/symptoms of physical distress.     Average METs 3.8 3.9 4       Resistance Training   Training Prescription Yes Yes Yes     Weight 2lb 2lb 5lbs     Reps 10-12 10-12 10-12       Interval Training   Interval Training No No No       Recumbant Bike   Level 3 3 -     Watts  - 50 -     Minutes 10 10 -     METs 2 3.75 -       NuStep   Level 3 4 4      Minutes 10 10 10      METs 3.8 4.3 4.2       Arm Ergometer   Level  - 4 4     Watts  -  - 49     Minutes  - 10 10     METs  - 2.5 4       Track   Laps 13 16 16      Minutes 10 10 10      METs 3.26 3.79 3.79       Home Exercise Plan   Plans to continue exercise at  - Home Home     Frequency  - Add 4 additional days to program exercise sessions. Add 4 additional days to program exercise sessions.        Exercise Comments:      Exercise Comments    Row Name 07/25/16 1748 08/08/16 1621 09/02/16 1640       Exercise Comments Off to a good start with exerciase. Doing  well with exercise. Pt walking ~1.2 miles 2 days per week outside of cardiac rehab. Pt would like to strengthen upper body. Will switch from recumbent bike to upper body arm ergometer at cardiac rehab. Pt is now walking 6 miles 2 days per week and 3 miles the other days he's not at cardiac rehab.        Discharge Exercise Prescription (Final Exercise Prescription Changes):     Exercise Prescription Changes - 09/02/16 1600      Exercise Review   Progression Yes     Response to Exercise   Blood Pressure (Admit) 118/70   Blood Pressure (Exercise) 170/82   Blood Pressure (Exit) 110/64   Heart Rate (Admit) 80 bpm   Heart Rate (Exercise) 124 bpm   Heart Rate (Exit)  89 bpm   Rating of Perceived Exertion (Exercise) 12   Symptoms none   Comments Reviewed home exercise guidelines on 07/27/16.   Duration Progress to 30 minutes of continuous aerobic without signs/symptoms of physical distress   Intensity THRR unchanged     Progression   Progression Continue to progress workloads to maintain intensity without signs/symptoms of physical distress.   Average METs 4     Resistance Training   Training Prescription Yes   Weight 5lbs   Reps 10-12     Interval Training   Interval Training No     Recumbant Bike   Level --   Watts --   Minutes --   METs --     NuStep   Level 4   Minutes 10   METs 4.2     Arm Ergometer   Level 4   Watts 49   Minutes 10   METs 4     Track   Laps 16   Minutes 10   METs 3.79     Home Exercise Plan   Plans to continue exercise at Home   Frequency Add 4 additional days to program exercise sessions.      Nutrition:  Target Goals: Understanding of nutrition guidelines, daily intake of sodium 1500mg , cholesterol 200mg , calories 30% from fat and 7% or less from saturated fats, daily to have 5 or more servings of fruits and vegetables.  Biometrics:     Pre Biometrics - 07/14/16 1346      Pre Biometrics   Waist Circumference 38.75 inches   Hip  Circumference 41 inches   Waist to Hip Ratio 0.95 %   Triceps Skinfold 16 mm   % Body Fat 26.6 %   Grip Strength 42 kg   Flexibility 0 in   Single Leg Stand 24.94 seconds       Nutrition Therapy Plan and Nutrition Goals:     Nutrition Therapy & Goals - 07/21/16 1443      Nutrition Therapy   Diet Carb Modified, Therapeutic Lifestyle Change     Personal Nutrition Goals   Personal Goal #1 Maintain wt while in Cardiac Rehab   Personal Goal #2 Improved glycemic control as evidenced by an improved A1c to a goal of 7.0 or less     Intervention Plan   Intervention Prescribe, educate and counsel regarding individualized specific dietary modifications aiming towards targeted core components such as weight, hypertension, lipid management, diabetes, heart failure and other comorbidities.   Expected Outcomes Short Term Goal: Understand basic principles of dietary content, such as calories, fat, sodium, cholesterol and nutrients.;Long Term Goal: Adherence to prescribed nutrition plan.      Nutrition Discharge: Nutrition Scores:     Nutrition Assessments - 07/21/16 1442      MEDFICTS Scores   Pre Score 36  will verify score with pt      Nutrition Goals Re-Evaluation:   Psychosocial: Target Goals: Acknowledge presence or absence of depression, maximize coping skills, provide positive support system. Participant is able to verbalize types and ability to use techniques and skills needed for reducing stress and depression.  Initial Review & Psychosocial Screening:     Initial Psych Review & Screening - 07/15/16 [place]? Yes     Barriers   Psychosocial barriers to participate in program There are no identifiable barriers or psychosocial needs.;The patient should benefit from training in stress management and relaxation.  Screening Interventions   Interventions Encouraged to exercise      Quality of Life Scores:     Quality of  Life - 07/14/16 1350      Quality of Life Scores   Health/Function Pre 28.04 %   Socioeconomic Pre 27.17 %   Psych/Spiritual Pre 26.14 %   Family Pre 30 %   GLOBAL Pre 27.76 %      PHQ-9: Recent Review Flowsheet Data    Depression screen [facility] 2/9 07/18/2016 07/14/2015   Decreased Interest 0 0   Down, Depressed, Hopeless 0 0   PHQ - 2 Score 0 0      Psychosocial Evaluation and Intervention:   Psychosocial Re-Evaluation:     Psychosocial Re-Evaluation    Row Name 08/11/16 0932 09/09/16 1456           Psychosocial Re-Evaluation   Interventions Encouraged to attend Cardiac Rehabilitation for the exercise Encouraged to attend Cardiac Rehabilitation for the exercise      Continued Psychosocial Services Needed No No         Vocational Rehabilitation: Provide vocational rehab assistance to qualifying candidates.   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 Participant will state understanding/return demonstration of topics presented.  Learning Barriers/Preferences:     Learning Barriers/Preferences - 07/14/16 0830      Learning Barriers/Preferences   Learning Barriers Sight   Learning Preferences Skilled Demonstration      Education Topics: Count Your Pulse:  -Group instruction provided by verbal instruction, demonstration, patient participation and written materials to support subject.  Instructors address importance of being able to find your pulse and how to count your pulse when at home without a heart monitor.  Patients get hands on experience counting their pulse with staff help  and individually.   Heart Attack, Angina, and Risk Factor Modification:  -Group instruction provided by verbal instruction, video, and written materials to support subject.  Instructors address signs and symptoms of angina and heart attacks.    Also discuss risk factors for heart disease and how to make changes to improve heart health risk factors.   Functional Fitness:  -Group instruction provided by verbal instruction, demonstration, patient participation, and written materials to support subject.  Instructors address safety measures for doing things around the house.  Discuss how to get up and down off the floor, how to pick things up properly, how to safely get out of a chair without assistance, and balance training.   Meditation and Mindfulness:  -Group instruction provided by verbal instruction, patient participation, and written materials to support subject.  Instructor addresses importance of mindfulness and meditation practice to help reduce stress and improve awareness.  Instructor also leads participants through a meditation exercise.    Stretching for Flexibility and Mobility:  -Group instruction provided by verbal instruction, patient participation, and written materials to support subject.  Instructors lead participants through series of stretches that are designed to increase flexibility thus improving mobility.  These stretches are additional exercise for major muscle groups that are typically performed during regular warm up and cool down.   Hands Only CPR Anytime:  -  Group instruction provided by verbal instruction, video, patient participation and written materials to support subject.  Instructors co-teach with AHA video for hands only CPR.  Participants get hands on experience with mannequins.   Nutrition I class: Heart Healthy Eating:  -Group instruction provided by PowerPoint slides, verbal discussion, and written materials to support subject matter. The instructor gives  an explanation and review of the Therapeutic Lifestyle Changes diet recommendations, which includes a discussion on lipid goals, dietary fat, sodium, fiber, plant stanol/sterol esters, sugar, and the components of a well-balanced, healthy diet.   Nutrition II class: Lifestyle Skills:  -Group instruction provided by PowerPoint slides, verbal discussion, and written materials to support subject matter. The instructor gives an explanation and review of label reading, grocery shopping for heart health, heart healthy recipe modifications, and ways to make healthier choices when eating out.   Diabetes Question & Answer:  -Group instruction provided by PowerPoint slides, verbal discussion, and written materials to support subject matter. The instructor gives an explanation and review of diabetes co-morbidities, pre- and post-prandial blood glucose goals, pre-exercise blood glucose goals, signs, symptoms, and treatment of hypoglycemia and hyperglycemia, and foot care basics. Flowsheet Row CARDIAC REHAB PHASE II EXERCISE from 09/07/2016 in [facility]  Date  08/05/16  Educator  RD  Instruction Review Code  2- meets goals/outcomes      Diabetes Blitz:  -Group instruction provided by PowerPoint slides, verbal discussion, and written materials to support subject matter. The instructor gives an explanation and review of the physiology behind type 1 and type 2 diabetes, diabetes medications and rational behind using different medications, pre- and post-prandial blood glucose recommendations and Hemoglobin A1c goals, diabetes diet, and exercise including blood glucose guidelines for exercising safely.    Portion Distortion:  -Group instruction provided by PowerPoint slides, verbal discussion, written materials, and food models to support subject matter. The instructor gives an explanation of serving size versus portion size, changes in portions sizes over the last 20 years, and  what consists of a serving from each food group. Flowsheet Row CARDIAC REHAB PHASE II EXERCISE from 09/07/2016 in [place]  Date  08/03/16  Educator  RD  Instruction Review Code  2- meets goals/outcomes      Stress Management:  -Group instruction provided by verbal instruction, video, and written materials to support subject matter.  Instructors review role of stress in heart disease and how to cope with stress positively.     Exercising on Your Own:  -Group instruction provided by verbal instruction, power point, and written materials to support subject.  Instructors discuss benefits of exercise, components of exercise, frequency and intensity of exercise, and end points for exercise.  Also discuss use of nitroglycerin and activating EMS.  Review options of places to exercise outside of rehab.  Review guidelines for sex with heart disease.   Cardiac Drugs I:  -Group instruction provided by verbal instruction and written materials to support subject.  Instructor reviews cardiac drug classes: antiplatelets, anticoagulants, beta blockers, and statins.  Instructor discusses reasons, side effects, and lifestyle considerations for each drug class. Flowsheet Row CARDIAC REHAB PHASE II EXERCISE from 09/07/2016 in [place]  Date  08/10/16  Educator  pharmD   Instruction Review Code  2- meets goals/outcomes      Cardiac Drugs II:  -Group instruction provided by verbal instruction and written materials to support subject.  Instructor reviews cardiac drug classes: angiotensin converting enzyme  inhibitors (ACE-I), angiotensin II receptor blockers (ARBs), nitrates, and calcium channel blockers.  Instructor discusses reasons, side effects, and lifestyle considerations for each drug class. Flowsheet Row CARDIAC REHAB PHASE II EXERCISE from 09/07/2016 in [place]  Date  09/07/16  Educator  pharmacy   Instruction Review Code  2- meets goals/outcomes      Anatomy and Physiology of the Circulatory System:  -Group instruction provided by verbal instruction, video, and written materials to support subject.  Reviews functional anatomy of heart, how it relates to various diagnoses, and what role the heart plays in the overall system.   Knowledge Questionnaire Score:     Knowledge Questionnaire Score - 07/14/16 1342      Knowledge Questionnaire Score   Pre Score 17/24      Core Components/Risk Factors/Patient Goals at Admission:     Personal Goals and Risk Factors at Admission - 07/14/16 1416      Core Components/Risk Factors/Patient Goals on Admission    Weight Management Weight Loss;Yes   Intervention Weight Management: Develop a combined nutrition and exercise program designed to reach desired caloric intake, while maintaining appropriate intake of nutrient and fiber, sodium and fats, and appropriate energy expenditure required for the weight goal.;Weight Management: Provide education and appropriate resources to help participant work on and attain dietary goals.;Weight Management/Obesity: Establish reasonable short term and long term weight goals.   Expected Outcomes Short Term: Continue to assess and modify interventions until short term weight is achieved;Long Term: Adherence to nutrition and physical activity/exercise program aimed toward attainment of established weight goal;Weight Maintenance: Understanding of the daily nutrition guidelines, which includes 25-35% calories from fat, 7% or less cal from saturated fats, less than 200mg  cholesterol, less than 1.5gm of sodium, & 5 or more servings of fruits and vegetables daily;Weight Loss: Understanding of general recommendations for a balanced deficit meal plan, which promotes 1-2 lb weight loss per week and includes a negative energy balance of [phone] kcal/d;Understanding recommendations for meals to include 15-35% energy as protein,  25-35% energy from fat, 35-60% energy from carbohydrates, less than 200mg  of dietary cholesterol, 20-35 gm of total fiber daily;Understanding of distribution of calorie intake throughout the day with the consumption of 4-5 meals/snacks      Core Components/Risk Factors/Patient Goals Review:      Goals and Risk Factor Review    Row Name 08/08/16 1623 09/02/16 1638           Core Components/Risk Factors/Patient Goals Review   Personal Goals Review Other Other      Review Will switch from recumbent bike to upper body arm ergometer to help patient achieve goal of increasing upper body strength. Doing well with exercise: currently walking 50 minutes (3-6 miles) 4 days per week. Pt doesn't feel upper body strength has increased thus far. Encouraged to increase hand weights from 4lbs to 5 lbs.      Expected Outcomes Achieve increase in upper body strength and cardiorespiratory fitness through regular aerobic and resistance training program. Achieve increase in upper body strength and cardiorespiratory fitness through regular aerobic and resistance training program as measured by grip strength measurement and 6 minute walk test.         Core Components/Risk Factors/Patient Goals at Discharge (Final Review):      Goals and Risk Factor Review - 09/02/16 1638      Core Components/Risk Factors/Patient Goals Review   Personal Goals Review Other   Review Doing well with exercise: currently walking 50  minutes (3-6 miles) 4 days per week. Pt doesn't feel upper body strength has increased thus far. Encouraged to increase hand weights from 4lbs to 5 lbs.   Expected Outcomes Achieve increase in upper body strength and cardiorespiratory fitness through regular aerobic and resistance training program as measured by grip strength measurement and 6 minute walk test.      ITP Comments:     ITP Comments    Row Name 07/14/16 0828 07/14/16 1621         ITP Comments Dr. Fransico Him immediately available  as the medical director Dr. Fransico Him, Medical Director         Comments: Pt is making expected progress toward personal goals after completing 21 sessions. Recommend continued exercise and life style modification education including  stress management and relaxation techniques to decrease cardiac risk profile. Barnet Pall, RN,BSN 09/09/2016 4:51 PM

## 2016-09-11 NOTE — Assessment & Plan Note (Signed)
Tolerating statin, encouraged heart healthy diet, avoid trans fats, minimize simple carbs and saturated fats. Increase exercise as tolerated 

## 2016-09-11 NOTE — Assessment & Plan Note (Signed)
Well controlled, no changes to meds. Encouraged heart healthy diet such as the DASH diet and exercise as tolerated.  °

## 2016-09-11 NOTE — Assessment & Plan Note (Signed)
Is healing well following closely with cardiology, no changes

## 2016-09-11 NOTE — Assessment & Plan Note (Signed)
hgba1c acceptable, minimize simple carbs. Increase exercise as tolerated. Continue current meds 

## 2016-09-11 NOTE — Progress Notes (Signed)
Patient ID: Jack Wade, male   DOB: 1941/11/04, 75 y.o.   MRN: [id number]   Subjective:    Patient ID: Jack Wade, male    DOB: [birth year], 75 y.o.   MRN: [id number]  Chief Complaint  Patient presents with  . Follow-up    HPI Patient is in today for follow up. No recent illness but he did have an episode of feeling shakey recently when his sugar dropped to 60, when he ate he felt better. He has recovered well from his open heart surgery after 6 vessels had to be worked on. No trouble with healing and has been doing well in CPR. Denies/palp/SOB/HA/congestion/fevers/GI or GU c/o. Taking meds as prescribed  Past Medical History:  Diagnosis Date  . Aortic calcification ([place]) 02/09/2015  . Appendicitis   . Back pain 02/09/2015  . CAD (coronary artery disease)    2 stents  . Chicken pox   . Diabetes mellitus type 2 in obese ([place]) 04/11/2016  . Diabetes type 2, controlled ([place])   . Dysphagia, unspecified(787.20) 03/20/2014  . Hereditary and idiopathic peripheral neuropathy 02/09/2015  . Hyperlipidemia   . Hypertension   . Hypothyroidism   . Measles   . Medicare annual wellness visit, subsequent 06/14/2015  . Melanoma ([place])    Scalp. 2012  . Mumps as a child  . Thyroid disease    [place] Hyperthyroidism    Past Surgical History:  Procedure Laterality Date  . APPENDECTOMY    . BACK SURGERY     x2 lumbar  . BACK SURGERY     late 1990s had 2 surgeries, first surgery lifted a heavy engine ruptured disds, at L4 and L5, cleaned discs no hardware very helpful. 2 years later while recovering from angiogram with weights applied to left leg caused a recurrence and required surgery again in same area with disc repair  . CARDIAC CATHETERIZATION    . CARDIAC CATHETERIZATION N/A 05/11/2016   Procedure: Left Heart Cath and Coronary Angiography;  Surgeon: Belva Crome, MD;  Location: [facility];  Service: Cardiovascular;  Laterality: N/A;  . CARDIAC CATHETERIZATION N/A  05/11/2016   Procedure: Intravascular Pressure Wire/FFR Study;  Surgeon: Belva Crome, MD;  Location: [facility];  Service: Cardiovascular;  Laterality: N/A;  . CORONARY ANGIOPLASTY WITH STENT PLACEMENT     2 stents  . CORONARY ARTERY BYPASS GRAFT N/A 05/17/2016   Procedure: CORONARY ARTERY BYPASS GRAFTING (CABG) x 6 using left mammory artery and right greater saphenous vein harvested endoscopically. [place] to LAD, SVG to Diagonal, SVG Sequential to OM2 and ramus intermediate, SVG Sequential to RCA and PDA;  Surgeon: [name], MD;  Location: [facility];  Service: Open Heart Surgery;  Laterality: N/A;  . KNEE ARTHROSCOPY     right knee  . LUMBAR LAMINECTOMY/DECOMPRESSION MICRODISCECTOMY Bilateral 04/10/2015   Procedure: LUMBAR LAMINECTOMY/DECOMPRESSION MICRODISCECTOMY 2 LEVELS;  Surgeon: Ashok Pall, MD;  Location: [facility];  Service: Neurosurgery;  Laterality: Bilateral;  Bilateral L45 L5S1 Laminectomy and Foraminotomy  . ROTATOR CUFF REPAIR Right   . SKIN CANCER EXCISION     melanoma on scalp  . TEE WITHOUT CARDIOVERSION N/A 05/17/2016   Procedure: TRANSESOPHAGEAL ECHOCARDIOGRAM (TEE);  Surgeon: [name], MD;  Location: [facility];  Service: Open Heart Surgery;  Laterality: N/A;  . TONSILLECTOMY AND ADENOIDECTOMY    . WISDOM TOOTH EXTRACTION      Family History  Problem Relation Age of Onset  . Heart disease Father   .  Emphysema Father   . Hypertension Father   . Cancer Father     lung/ healthy  . Heart disease Mother     Deceased  . Cancer Mother     breast  . Hypertension Mother   . Cancer Maternal Grandmother     colon  . Diabetes Brother     type 2  . Heart disease Brother   . Pulmonary embolism Son     vasculiti    Social History   Social History  . Marital status: Married    Spouse name: N/A  . Number of children: 2  . Years of education: N/A   Occupational History  . Not on file.   Social History Main Topics  . Smoking status: Former Smoker      Packs/day: 2.00    Years: 30.00    Types: Cigarettes    Start date: 12/27/1983  . Smokeless tobacco: Never Used  . Alcohol use 0.0 oz/week     Comment: occasionally  . Drug use: No  . 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    Other Topics Concern  . Not on file   Social History Narrative  . No narrative on file    Outpatient Medications Prior to Visit  Medication Sig Dispense Refill  . aspirin 81 MG tablet Take 81 mg by mouth daily.    Marland Kitchen glipiZIDE (GLUCOTROL) 5 MG tablet Take 2 tablets (10 mg total) by mouth 2 (two) times daily before a meal. 360 tablet 2  . levothyroxine (SYNTHROID, LEVOTHROID) 100 MCG tablet TAKE 1 TABLET (100 MCG TOTAL) BY MOUTH DAILY BEFORE BREAKFAST. 90 tablet 1  . metFORMIN (GLUCOPHAGE-XR) 500 MG 24 hr tablet Take 2 tablets (1,000 mg total) by mouth 2 (two) times daily. 360 tablet 2  . metoprolol succinate (TOPROL XL) 25 MG 24 hr tablet Take 0.5 tablets (12.5 mg total) by mouth daily. 15 tablet 6  . rosuvastatin (CRESTOR) 40 MG tablet Take 1 tablet (40 mg total) by mouth daily. 90 tablet 3  . glucose blood (ONETOUCH VERIO) test strip Use as directed twice daily to check blood sugar.  DX E11.8 100 each 6  . ONE TOUCH LANCETS MISC Use as directed twice daily to check blood sugar.  DX E11.8 200 each 6  . simvastatin (ZOCOR) 40 MG tablet TAKE 1 TABLET (40 MG TOTAL) BY MOUTH DAILY. 90 tablet 2   No facility-administered medications prior to visit.     No Known Allergies  Review of Systems  Constitutional: Positive for malaise/fatigue. Negative for fever.  HENT: Negative for congestion.   Eyes: Negative for blurred vision.  Respiratory: Negative for shortness of breath.   Cardiovascular: Negative for chest pain, palpitations and leg swelling.  Gastrointestinal: Negative for abdominal pain, blood in stool and nausea.  Genitourinary: Negative for dysuria and frequency.  Musculoskeletal: Negative for  falls.  Skin: Negative for rash.  Neurological: Negative for dizziness, loss of consciousness and headaches.  Endo/Heme/Allergies: Negative for environmental allergies.  Psychiatric/Behavioral: Negative for depression. The patient is not nervous/anxious.        Objective:    Physical Exam  Constitutional: He is oriented to person, place, and time. He appears well-developed and well-nourished. No distress.  HENT:  Head: Normocephalic and atraumatic.  Nose: Nose normal.  Eyes: Right eye exhibits no discharge. Left eye exhibits no discharge.  Neck: Normal range of motion. Neck supple.  Cardiovascular: Normal rate and regular rhythm.  No murmur heard. Pulmonary/Chest: Effort normal and breath sounds normal.  Abdominal: Soft. Bowel sounds are normal. There is no tenderness.  Musculoskeletal: He exhibits no edema.  Neurological: He is alert and oriented to person, place, and time.  Skin: Skin is warm and dry.  Psychiatric: He has a normal mood and affect.  Nursing note and vitals reviewed.   BP 108/68 (BP Location: Left Arm, Patient Position: Sitting, Cuff Size: Normal)   Pulse 71   Temp 97.7 F (36.5 C) (Oral)   Ht 6' (1.829 m)   Wt 196 lb 8 oz (89.1 kg)   SpO2 98%   BMI 26.65 kg/m  Wt Readings from Last 3 Encounters:  08/30/16 196 lb 8 oz (89.1 kg)  08/24/16 196 lb 12.8 oz (89.3 kg)  08/11/16 192 lb (87.1 kg)     Lab Results  Component Value Date   WBC 11.6 (H) 05/27/2016   HGB 8.8 (L) 05/27/2016   HCT 28.3 (L) 05/27/2016   PLT 493 (H) 05/27/2016   GLUCOSE 125 (H) 07/08/2016   CHOL 73 (L) 08/25/2016   TRIG 93 08/25/2016   HDL 42 08/25/2016   LDLDIRECT 43.0 04/11/2016   LDLCALC 12 08/25/2016   ALT 12 08/25/2016   AST 18 08/25/2016   NA 138 07/08/2016   K 4.0 07/08/2016   CL 104 07/08/2016   CREATININE 1.35 07/08/2016   BUN 16 07/08/2016   CO2 25 07/08/2016   TSH 3.31 04/11/2016   INR 1.61 (H) 05/17/2016   HGBA1C 7.1 (H) 08/30/2016   MICROALBUR <0.7  04/11/2016    Lab Results  Component Value Date   TSH 3.31 04/11/2016   Lab Results  Component Value Date   WBC 11.6 (H) 05/27/2016   HGB 8.8 (L) 05/27/2016   HCT 28.3 (L) 05/27/2016   MCV 86.0 05/27/2016   PLT 493 (H) 05/27/2016   Lab Results  Component Value Date   NA 138 07/08/2016   K 4.0 07/08/2016   CO2 25 07/08/2016   GLUCOSE 125 (H) 07/08/2016   BUN 16 07/08/2016   CREATININE 1.35 07/08/2016   BILITOT 0.4 08/25/2016   ALKPHOS 52 08/25/2016   AST 18 08/25/2016   ALT 12 08/25/2016   PROT 6.9 08/25/2016   ALBUMIN 4.2 08/25/2016   CALCIUM 10.3 07/08/2016   ANIONGAP 6 05/23/2016   GFR 54.97 (L) 07/08/2016   Lab Results  Component Value Date   CHOL 73 (L) 08/25/2016   Lab Results  Component Value Date   HDL 42 08/25/2016   Lab Results  Component Value Date   LDLCALC 12 08/25/2016   Lab Results  Component Value Date   TRIG 93 08/25/2016   Lab Results  Component Value Date   CHOLHDL 1.7 08/25/2016   Lab Results  Component Value Date   HGBA1C 7.1 (H) 08/30/2016       Assessment & Plan:   Problem List Items Addressed This Visit    Hyperlipidemia    Tolerating statin, encouraged heart healthy diet, avoid trans fats, minimize simple carbs and saturated fats. Increase exercise as tolerated      Hypothyroidism    Encouraged complete cessation. Discussed need to quit as relates to risk of numerous cancers, cardiac and pulmonary disease as well as neurologic complications. Counseled for greater than 3 minutes      Hx of CABG 05/17/16    Is healing well following closely with cardiology, no changes      HTN (hypertension)    Well controlled, no  changes to meds. Encouraged heart healthy diet such as the DASH diet and exercise as tolerated.       Type 2 diabetes with complication (HCC) - Primary    hgba1c acceptable, minimize simple carbs. Increase exercise as tolerated. Continue current meds      Relevant Orders   Hemoglobin A1c (Completed)      Other Visit Diagnoses    Encounter for immunization       Relevant Medications   glucose blood (ONETOUCH VERIO) test strip   ONE TOUCH LANCETS MISC   Other Relevant Orders   Flu vaccine HIGH DOSE PF (Completed)      I have discontinued Mr. Cavanaugh ONE TOUCH LANCETS and simvastatin. I have also changed his glucose blood and ONE TOUCH LANCETS. Additionally, I am having him maintain his aspirin, levothyroxine, rosuvastatin, metFORMIN, glipiZIDE, and metoprolol succinate.  Meds ordered this encounter  Medications  . DISCONTD: ONE TOUCH LANCETS MISC    Sig: Use as directed twice daily to check blood sugar.  DX E11.8    Dispense:  300 each    Refill:  3  . glucose blood (ONETOUCH VERIO) test strip    Sig: Use as directed three times daily to check blood sugar.  DX E11.8    Dispense:  300 each    Refill:  3  . ONE TOUCH LANCETS MISC    Sig: Use as directed three times daily to check blood sugar.  DX E11.8    Dispense:  300 each    Refill:  3     Penni Homans, MD

## 2016-09-11 NOTE — Assessment & Plan Note (Signed)
Encouraged complete cessation. Discussed need to quit as relates to risk of numerous cancers, cardiac and pulmonary disease as well as neurologic complications. Counseled for greater than 3 minutes 

## 2016-09-12 ENCOUNTER — Encounter (HOSPITAL_COMMUNITY)
Admission: RE | Admit: 2016-09-12 | Discharge: 2016-09-12 | Disposition: A | Discharge status: Home / Self Care | Payer: Medicare Other | Source: Ambulatory Visit | Attending: Cardiology | Admitting: Cardiology

## 2016-09-12 DIAGNOSIS — Z951 Presence of aortocoronary bypass graft: Secondary | ICD-10-CM | POA: Diagnosis not present

## 2016-09-12 DIAGNOSIS — E119 Type 2 diabetes mellitus without complications: Secondary | ICD-10-CM | POA: Diagnosis not present

## 2016-09-14 ENCOUNTER — Encounter (HOSPITAL_COMMUNITY)
Admission: RE | Admit: 2016-09-14 | Discharge: 2016-09-14 | Disposition: A | Discharge status: Home / Self Care | Payer: Medicare Other | Source: Ambulatory Visit

## 2016-09-14 ENCOUNTER — Encounter (HOSPITAL_COMMUNITY): Payer: Medicare Other

## 2016-09-14 DIAGNOSIS — Z951 Presence of aortocoronary bypass graft: Secondary | ICD-10-CM | POA: Diagnosis not present

## 2016-09-14 DIAGNOSIS — E119 Type 2 diabetes mellitus without complications: Secondary | ICD-10-CM | POA: Diagnosis not present

## 2016-09-16 ENCOUNTER — Ambulatory Visit (INDEPENDENT_AMBULATORY_CARE_PROVIDER_SITE_OTHER): Payer: Medicare Other | Admitting: Family Medicine

## 2016-09-16 ENCOUNTER — Encounter (HOSPITAL_COMMUNITY): Payer: Medicare Other

## 2016-09-16 ENCOUNTER — Encounter: Payer: Self-pay | Admitting: Family Medicine

## 2016-09-16 VITALS — BP 114/63 | HR 69 | Temp 98.0°F | Ht 72.0 in | Wt 198.5 lb

## 2016-09-16 DIAGNOSIS — I10 Essential (primary) hypertension: Secondary | ICD-10-CM | POA: Diagnosis not present

## 2016-09-16 DIAGNOSIS — E785 Hyperlipidemia, unspecified: Secondary | ICD-10-CM | POA: Diagnosis not present

## 2016-09-16 DIAGNOSIS — I208 Other forms of angina pectoris: Secondary | ICD-10-CM | POA: Diagnosis not present

## 2016-09-16 DIAGNOSIS — Z0000 Encounter for general adult medical examination without abnormal findings: Secondary | ICD-10-CM

## 2016-09-16 DIAGNOSIS — E118 Type 2 diabetes mellitus with unspecified complications: Secondary | ICD-10-CM

## 2016-09-16 DIAGNOSIS — E038 Other specified hypothyroidism: Secondary | ICD-10-CM | POA: Diagnosis not present

## 2016-09-16 LAB — HM DIABETES FOOT EXAM

## 2016-09-16 MED ORDER — GLIPIZIDE 5 MG PO TABS: ORAL_TABLET | ORAL | 2 refills | Status: DC

## 2016-09-16 NOTE — Patient Instructions (Signed)
Consider Bystolic if continue to have side effects on the Metoprolol   Preventive Care for Adults, Male A healthy lifestyle and preventive care can promote health and wellness. Preventive health guidelines for men include the following key practices:  A routine yearly physical is a good way to check with your health care provider about your health and preventative screening. It is a chance to share any concerns and updates on your health and to receive a thorough exam.  Visit your dentist for a routine exam and preventative care every 6 months. Brush your teeth twice a day and floss once a day. Good oral hygiene prevents tooth decay and gum disease.  The frequency of eye exams is based on your age, health, family medical history, use of contact lenses, and other factors. Follow your health care provider's recommendations for frequency of eye exams.  Eat a healthy diet. Foods such as vegetables, fruits, whole grains, low-fat dairy products, and lean protein foods contain the nutrients you need without too many calories. Decrease your intake of foods high in solid fats, added sugars, and salt. Eat the right amount of calories for you.Get information about a proper diet from your health care provider, if necessary.  Regular physical exercise is one of the most important things you can do for your health. Most adults should get at least 150 minutes of moderate-intensity exercise (any activity that increases your heart rate and causes you to sweat) each week. In addition, most adults need muscle-strengthening exercises on 2 or more days a week.  Maintain a healthy weight. The body mass index (BMI) is a screening tool to identify possible weight problems. It provides an estimate of body fat based on height and weight. Your health care provider can find your BMI and can help you achieve or maintain a healthy weight.For adults 20 years and older:  A BMI below 18.5 is considered underweight.  A BMI of  18.5 to 24.9 is normal.  A BMI of 25 to 29.9 is considered overweight.  A BMI of 30 and above is considered obese.  Maintain normal blood lipids and cholesterol levels by exercising and minimizing your intake of saturated fat. Eat a balanced diet with plenty of fruit and vegetables. Blood tests for lipids and cholesterol should begin at age 64 and be repeated every 5 years. If your lipid or cholesterol levels are high, you are over 50, or you are at high risk for heart disease, you may need your cholesterol levels checked more frequently.Ongoing high lipid and cholesterol levels should be treated with medicines if diet and exercise are not working.  If you smoke, find out from your health care provider how to quit. If you do not use tobacco, do not start.  Lung cancer screening is recommended for adults aged 83-80 years who are at high risk for developing lung cancer because of a history of smoking. A yearly low-dose CT scan of the lungs is recommended for people who have at least a 30-pack-year history of smoking and are a current smoker or have quit within the past 15 years. A pack year of smoking is smoking an average of 1 pack of cigarettes a day for 1 year (for example: 1 pack a day for 30 years or 2 packs a day for 15 years). Yearly screening should continue until the smoker has stopped smoking for at least 15 years. Yearly screening should be stopped for people who develop a health problem that would prevent them from having lung  cancer treatment.  If you choose to drink alcohol, do not have more than 2 drinks per day. One drink is considered to be 12 ounces (355 mL) of beer, 5 ounces (148 mL) of wine, or 1.5 ounces (44 mL) of liquor.  Avoid use of street drugs. Do not share needles with anyone. Ask for help if you need support or instructions about stopping the use of drugs.  High blood pressure causes heart disease and increases the risk of stroke. Your blood pressure should be checked at  least every 1-2 years. Ongoing high blood pressure should be treated with medicines, if weight loss and exercise are not effective.  If you are 59-29 years old, ask your health care provider if you should take aspirin to prevent heart disease.  Diabetes screening is done by taking a blood sample to check your blood glucose level after you have not eaten for a certain period of time (fasting). If you are not overweight and you do not have risk factors for diabetes, you should be screened once every 3 years starting at age 7. If you are overweight or obese and you are 72-62 years of age, you should be screened for diabetes every year as part of your cardiovascular risk assessment.  Colorectal cancer can be detected and often prevented. Most routine colorectal cancer screening begins at the age of 48 and continues through age 57. However, your health care provider may recommend screening at an earlier age if you have risk factors for colon cancer. On a yearly basis, your health care provider may provide home test kits to check for hidden blood in the stool. Use of a small camera at the end of a tube to directly examine the colon (sigmoidoscopy or colonoscopy) can detect the earliest forms of colorectal cancer. Talk to your health care provider about this at age 23, when routine screening begins. Direct exam of the colon should be repeated every 5-10 years through age 75, unless early forms of precancerous polyps or small growths are found.  People who are at an increased risk for hepatitis B should be screened for this virus. You are considered at high risk for hepatitis B if:  You were born in a country where hepatitis B occurs often. Talk with your health care provider about which countries are considered high risk.  Your parents were born in a high-risk country and you have not received a shot to protect against hepatitis B (hepatitis B vaccine).  You have HIV or AIDS.  You use needles to inject  street drugs.  You live with, or have sex with, someone who has hepatitis B.  You are a man who has sex with other men (MSM).  You get hemodialysis treatment.  You take certain medicines for conditions such as cancer, organ transplantation, and autoimmune conditions.  Hepatitis C blood testing is recommended for all people born from [birth year] through 1965 and any individual with known risks for hepatitis C.  Practice safe sex. Use condoms and avoid high-risk sexual practices to reduce the spread of sexually transmitted infections (STIs). STIs include gonorrhea, chlamydia, syphilis, trichomonas, herpes, HPV, and human immunodeficiency virus (HIV). Herpes, HIV, and HPV are viral illnesses that have no cure. They can result in disability, cancer, and death.  If you are a man who has sex with other men, you should be screened at least once per year for:  HIV.  Urethral, rectal, and pharyngeal infection of gonorrhea, chlamydia, or both.  If you are at  risk of being infected with HIV, it is recommended that you take a prescription medicine daily to prevent HIV infection. This is called preexposure prophylaxis (PrEP). You are considered at risk if:  You are a man who has sex with other men (MSM) and have other risk factors.  You are a heterosexual man, are sexually active, and are at increased risk for HIV infection.  You take drugs by injection.  You are sexually active with a partner who has HIV.  Talk with your health care provider about whether you are at high risk of being infected with HIV. If you choose to begin PrEP, you should first be tested for HIV. You should then be tested every 3 months for as long as you are taking PrEP.  A one-time screening for abdominal aortic aneurysm (AAA) and surgical repair of large AAAs by ultrasound are recommended for men ages 67 to 10 years who are current or former smokers.  Healthy men should no longer receive prostate-specific antigen (PSA) blood  tests as part of routine cancer screening. Talk with your health care provider about prostate cancer screening.  Testicular cancer screening is not recommended for adult males who have no symptoms. Screening includes self-exam, a health care provider exam, and other screening tests. Consult with your health care provider about any symptoms you have or any concerns you have about testicular cancer.  Use sunscreen. Apply sunscreen liberally and repeatedly throughout the day. You should seek shade when your shadow is shorter than you. Protect yourself by wearing long sleeves, pants, a wide-brimmed hat, and sunglasses year round, whenever you are outdoors.  Once a month, do a whole-body skin exam, using a mirror to look at the skin on your back. Tell your health care provider about new moles, moles that have irregular borders, moles that are larger than a pencil eraser, or moles that have changed in shape or color.  Stay current with required vaccines (immunizations).  Influenza vaccine. All adults should be immunized every year.  Tetanus, diphtheria, and acellular pertussis (Td, Tdap) vaccine. An adult who has not previously received Tdap or who does not know his vaccine status should receive 1 dose of Tdap. This initial dose should be followed by tetanus and diphtheria toxoids (Td) booster doses every 10 years. Adults with an unknown or incomplete history of completing a 3-dose immunization series with Td-containing vaccines should begin or complete a primary immunization series including a Tdap dose. Adults should receive a Td booster every 10 years.  Varicella vaccine. An adult without evidence of immunity to varicella should receive 2 doses or a second dose if he has previously received 1 dose.  Human papillomavirus (HPV) vaccine. Males aged 11-21 years who have not received the vaccine previously should receive the 3-dose series. Males aged 22-26 years may be immunized. Immunization is recommended  through the age of 58 years for any male who has sex with males and did not get any or all doses earlier. Immunization is recommended for any person with an immunocompromised condition through the age of 26 years if he did not get any or all doses earlier. During the 3-dose series, the second dose should be obtained 4-8 weeks after the first dose. The third dose should be obtained 24 weeks after the first dose and 16 weeks after the second dose.  Zoster vaccine. One dose is recommended for adults aged 10 years or older unless certain conditions are present.  Measles, mumps, and rubella (MMR) vaccine. Adults born before  1956 generally are considered immune to measles and mumps. Adults born in [birth year] or later should have 1 or more doses of MMR vaccine unless there is a contraindication to the vaccine or there is laboratory evidence of immunity to each of the three diseases. A routine second dose of MMR vaccine should be obtained at least 28 days after the first dose for students attending postsecondary schools, health care workers, or international travelers. People who received inactivated measles vaccine or an unknown type of measles vaccine during 1963-1967 should receive 2 doses of MMR vaccine. People who received inactivated mumps vaccine or an unknown type of mumps vaccine before 1979 and are at high risk for mumps infection should consider immunization with 2 doses of MMR vaccine. Unvaccinated health care workers born before [birth year] who lack laboratory evidence of measles, mumps, or rubella immunity or laboratory confirmation of disease should consider measles and mumps immunization with 2 doses of MMR vaccine or rubella immunization with 1 dose of MMR vaccine.  Pneumococcal 13-valent conjugate (PCV13) vaccine. When indicated, a person who is uncertain of his immunization history and has no record of immunization should receive the PCV13 vaccine. All adults 58 years of age and older should receive this  vaccine. An adult aged 62 years or older who has certain medical conditions and has not been previously immunized should receive 1 dose of PCV13 vaccine. This PCV13 should be followed with a dose of pneumococcal polysaccharide (PPSV23) vaccine. Adults who are at high risk for pneumococcal disease should obtain the PPSV23 vaccine at least 8 weeks after the dose of PCV13 vaccine. Adults older than 74 years of age who have normal immune system function should obtain the PPSV23 vaccine dose at least 1 year after the dose of PCV13 vaccine.  Pneumococcal polysaccharide (PPSV23) vaccine. When PCV13 is also indicated, PCV13 should be obtained first. All adults aged 12 years and older should be immunized. An adult younger than age 21 years who has certain medical conditions should be immunized. Any person who resides in a nursing home or long-term care facility should be immunized. An adult smoker should be immunized. People with an immunocompromised condition and certain other conditions should receive both PCV13 and PPSV23 vaccines. People with human immunodeficiency virus (HIV) infection should be immunized as soon as possible after diagnosis. Immunization during chemotherapy or radiation therapy should be avoided. Routine use of PPSV23 vaccine is not recommended for American Indians, [place] Natives, or people younger than 65 years unless there are medical conditions that require PPSV23 vaccine. When indicated, people who have unknown immunization and have no record of immunization should receive PPSV23 vaccine. One-time revaccination 5 years after the first dose of PPSV23 is recommended for people aged 19-64 years who have chronic kidney failure, nephrotic syndrome, asplenia, or immunocompromised conditions. People who received 1-2 doses of PPSV23 before age 82 years should receive another dose of PPSV23 vaccine at age 68 years or later if at least 5 years have passed since the previous dose. Doses of PPSV23 are not  needed for people immunized with PPSV23 at or after age 66 years.  Meningococcal vaccine. Adults with asplenia or persistent complement component deficiencies should receive 2 doses of quadrivalent meningococcal conjugate (MenACWY-D) vaccine. The doses should be obtained at least 2 months apart. Microbiologists working with certain meningococcal bacteria, [employer] recruits, people at risk during an outbreak, and people who travel to or live in countries with a high rate of meningitis should be immunized. A first-year college student up through  age 42 years who is living in a residence hall should receive a dose if he did not receive a dose on or after his 16th birthday. Adults who have certain high-risk conditions should receive one or more doses of vaccine.  Hepatitis A vaccine. Adults who wish to be protected from this disease, have chronic liver disease, work with hepatitis A-infected animals, work in hepatitis A research labs, or travel to or work in countries with a high rate of hepatitis A should be immunized. Adults who were previously unvaccinated and who anticipate close contact with an international adoptee during the first 60 days after arrival in the [place] from a country with a high rate of hepatitis A should be immunized.  Hepatitis B vaccine. Adults should be immunized if they wish to be protected from this disease, are under age 84 years and have diabetes, have chronic liver disease, have had more than one sex partner in the past 6 months, may be exposed to blood or other infectious body fluids, are household contacts or sex partners of hepatitis B positive people, are clients or workers in certain care facilities, or travel to or work in countries with a high rate of hepatitis B.  Haemophilus influenzae type b (Hib) vaccine. A previously unvaccinated person with asplenia or sickle cell disease or having a scheduled splenectomy should receive 1 dose of Hib vaccine. Regardless of  previous immunization, a recipient of a hematopoietic stem cell transplant should receive a 3-dose series 6-12 months after his successful transplant. Hib vaccine is not recommended for adults with HIV infection. Preventive Service / Frequency Ages 35 to 73  Blood pressure check.** / Every 3-5 years.  Lipid and cholesterol check.** / Every 5 years beginning at age 86.  Hepatitis C blood test.** / For any individual with known risks for hepatitis C.  Skin self-exam. / Monthly.  Influenza vaccine. / Every year.  Tetanus, diphtheria, and acellular pertussis (Tdap, Td) vaccine.** / Consult your health care provider. 1 dose of Td every 10 years.  Varicella vaccine.** / Consult your health care provider.  HPV vaccine. / 3 doses over 6 months, if 49 or younger.  Measles, mumps, rubella (MMR) vaccine.** / You need at least 1 dose of MMR if you were born in 1957 or later. You may also need a second dose.  Pneumococcal 13-valent conjugate (PCV13) vaccine.** / Consult your health care provider.  Pneumococcal polysaccharide (PPSV23) vaccine.** / 1 to 2 doses if you smoke cigarettes or if you have certain conditions.  Meningococcal vaccine.** / 1 dose if you are age 48 to 80 years and a Market researcher living in a residence hall, or have one of several medical conditions. You may also need additional booster doses.  Hepatitis A vaccine.** / Consult your health care provider.  Hepatitis B vaccine.** / Consult your health care provider.  Haemophilus influenzae type b (Hib) vaccine.** / Consult your health care provider. Ages 23 to 49  Blood pressure check.** / Every year.  Lipid and cholesterol check.** / Every 5 years beginning at age 71.  Lung cancer screening. / Every year if you are aged 24-80 years and have a 30-pack-year history of smoking and currently smoke or have quit within the past 15 years. Yearly screening is stopped once you have quit smoking for at least 15 years or  develop a health problem that would prevent you from having lung cancer treatment.  Fecal occult blood test (FOBT) of stool. / Every year beginning at  age 82 and continuing until age 66. You may not have to do this test if you get a colonoscopy every 10 years.  Flexible sigmoidoscopy** or colonoscopy.** / Every 5 years for a flexible sigmoidoscopy or every 10 years for a colonoscopy beginning at age 48 and continuing until age 19.  Hepatitis C blood test.** / For all people born from [birth year] through 1965 and any individual with known risks for hepatitis C.  Skin self-exam. / Monthly.  Influenza vaccine. / Every year.  Tetanus, diphtheria, and acellular pertussis (Tdap/Td) vaccine.** / Consult your health care provider. 1 dose of Td every 10 years.  Varicella vaccine.** / Consult your health care provider.  Zoster vaccine.** / 1 dose for adults aged 70 years or older.  Measles, mumps, rubella (MMR) vaccine.** / You need at least 1 dose of MMR if you were born in 1956 or later. You may also need a second dose.  Pneumococcal 13-valent conjugate (PCV13) vaccine.** / Consult your health care provider.  Pneumococcal polysaccharide (PPSV23) vaccine.** / 1 to 2 doses if you smoke cigarettes or if you have certain conditions.  Meningococcal vaccine.** / Consult your health care provider.  Hepatitis A vaccine.** / Consult your health care provider.  Hepatitis B vaccine.** / Consult your health care provider.  Haemophilus influenzae type b (Hib) vaccine.** / Consult your health care provider. Ages 64 and over  Blood pressure check.** / Every year.  Lipid and cholesterol check.**/ Every 5 years beginning at age 56.  Lung cancer screening. / Every year if you are aged 66-80 years and have a 30-pack-year history of smoking and currently smoke or have quit within the past 15 years. Yearly screening is stopped once you have quit smoking for at least 15 years or develop a health problem that would  prevent you from having lung cancer treatment.  Fecal occult blood test (FOBT) of stool. / Every year beginning at age 11 and continuing until age 60. You may not have to do this test if you get a colonoscopy every 10 years.  Flexible sigmoidoscopy** or colonoscopy.** / Every 5 years for a flexible sigmoidoscopy or every 10 years for a colonoscopy beginning at age 87 and continuing until age 13.  Hepatitis C blood test.** / For all people born from [birth year] through 1964 and any individual with known risks for hepatitis C.  Abdominal aortic aneurysm (AAA) screening.** / A one-time screening for ages 75 to 38 years who are current or former smokers.  Skin self-exam. / Monthly.  Influenza vaccine. / Every year.  Tetanus, diphtheria, and acellular pertussis (Tdap/Td) vaccine.** / 1 dose of Td every 10 years.  Varicella vaccine.** / Consult your health care provider.  Zoster vaccine.** / 1 dose for adults aged 51 years or older.  Pneumococcal 13-valent conjugate (PCV13) vaccine.** / 1 dose for all adults aged 94 years and older.  Pneumococcal polysaccharide (PPSV23) vaccine.** / 1 dose for all adults aged 65 years and older.  Meningococcal vaccine.** / Consult your health care provider.  Hepatitis A vaccine.** / Consult your health care provider.  Hepatitis B vaccine.** / Consult your health care provider.  Haemophilus influenzae type b (Hib) vaccine.** / Consult your health care provider. **Family history and personal history of risk and conditions may change your health care provider's recommendations.   This information is not intended to replace advice given to you by your health care provider. Make sure you discuss any questions you have with your health care provider.   Document  Released: 02/07/2002 Document Revised: 01/02/2015 Document Reviewed: 05/09/2011 Elsevier Interactive Patient Education [employer].

## 2016-09-16 NOTE — Progress Notes (Signed)
Pre visit review using our clinic review tool, if applicable. No additional management support is needed unless otherwise documented below in the visit note. 

## 2016-09-19 ENCOUNTER — Encounter (HOSPITAL_COMMUNITY)
Admission: RE | Admit: 2016-09-19 | Discharge: 2016-09-19 | Disposition: A | Discharge status: Home / Self Care | Payer: Medicare Other | Source: Ambulatory Visit | Attending: Cardiology | Admitting: Cardiology

## 2016-09-19 DIAGNOSIS — E119 Type 2 diabetes mellitus without complications: Secondary | ICD-10-CM | POA: Diagnosis not present

## 2016-09-19 DIAGNOSIS — Z951 Presence of aortocoronary bypass graft: Secondary | ICD-10-CM | POA: Diagnosis not present

## 2016-09-20 DIAGNOSIS — M4806 Spinal stenosis, lumbar region: Secondary | ICD-10-CM | POA: Diagnosis not present

## 2016-09-20 DIAGNOSIS — Z6826 Body mass index (BMI) 26.0-26.9, adult: Secondary | ICD-10-CM | POA: Diagnosis not present

## 2016-09-21 ENCOUNTER — Encounter (HOSPITAL_COMMUNITY)
Admission: RE | Admit: 2016-09-21 | Discharge: 2016-09-21 | Disposition: A | Discharge status: Home / Self Care | Payer: Medicare Other | Source: Ambulatory Visit | Attending: Cardiology | Admitting: Cardiology

## 2016-09-21 DIAGNOSIS — E119 Type 2 diabetes mellitus without complications: Secondary | ICD-10-CM | POA: Diagnosis not present

## 2016-09-21 DIAGNOSIS — Z951 Presence of aortocoronary bypass graft: Secondary | ICD-10-CM

## 2016-09-23 ENCOUNTER — Encounter (HOSPITAL_COMMUNITY)
Admission: RE | Admit: 2016-09-23 | Discharge: 2016-09-23 | Disposition: A | Discharge status: Home / Self Care | Payer: Medicare Other | Source: Ambulatory Visit | Attending: Cardiology | Admitting: Cardiology

## 2016-09-23 DIAGNOSIS — Z951 Presence of aortocoronary bypass graft: Secondary | ICD-10-CM | POA: Diagnosis not present

## 2016-09-23 DIAGNOSIS — E119 Type 2 diabetes mellitus without complications: Secondary | ICD-10-CM | POA: Diagnosis not present

## 2016-09-24 NOTE — Assessment & Plan Note (Signed)
Tolerating statin, encouraged heart healthy diet, avoid trans fats, minimize simple carbs and saturated fats. Increase exercise as tolerated 

## 2016-09-24 NOTE — Assessment & Plan Note (Signed)
Patient encouraged to maintain heart healthy diet, regular exercise, adequate sleep. Consider daily probiotics. Take medications as prescribed. Labs reviewed 

## 2016-09-24 NOTE — Progress Notes (Signed)
Patient ID: Jack Wade, male   DOB: March 28, 1941, 75 y.o.   MRN: [id number]    Subjective:    Patient ID: Jack Wade, male    DOB: November 01, 1941, 75 y.o.   MRN: [id number]  Chief Complaint  Patient presents with  . Annual Exam    HPI Patient is in today for annual preventative exam and follow up on numerous chronic medical concerns. No recent febrile illness or acute concerns. No recent hospitalizations. Denies CP/palp/SOB/HA/congestion/fevers/GI or GU c/o. Taking meds as prescribed. Has had another skin cancer removed by dermatology recently.  Past Medical History:  Diagnosis Date  . Aortic calcification ([place]) 02/09/2015  . Appendicitis   . Back pain 02/09/2015  . CAD (coronary artery disease)    2 stents  . Chicken pox   . Diabetes mellitus type 2 in obese ([place]) 04/11/2016  . Diabetes type 2, controlled ([place])   . Dysphagia, unspecified(787.20) 03/20/2014  . Hereditary and idiopathic peripheral neuropathy 02/09/2015  . Hyperlipidemia   . Hypertension   . Hypothyroidism   . Measles   . Medicare annual wellness visit, subsequent 06/14/2015  . Melanoma ([place])    Scalp. 2012  . Mumps as a child  . Thyroid disease    [place] Hyperthyroidism    Past Surgical History:  Procedure Laterality Date  . APPENDECTOMY    . BACK SURGERY     x2 lumbar  . BACK SURGERY     late 1990s had 2 surgeries, first surgery lifted a heavy engine ruptured disds, at L4 and L5, cleaned discs no hardware very helpful. 2 years later while recovering from angiogram with weights applied to left leg caused a recurrence and required surgery again in same area with disc repair  . CARDIAC CATHETERIZATION    . CARDIAC CATHETERIZATION N/A 05/11/2016   Procedure: Left Heart Cath and Coronary Angiography;  Surgeon: Belva Crome, MD;  Location: [facility];  Service: Cardiovascular;  Laterality: N/A;  . CARDIAC CATHETERIZATION N/A 05/11/2016   Procedure: Intravascular Pressure Wire/FFR Study;  Surgeon:  Belva Crome, MD;  Location: [facility];  Service: Cardiovascular;  Laterality: N/A;  . CORONARY ANGIOPLASTY WITH STENT PLACEMENT     2 stents  . CORONARY ARTERY BYPASS GRAFT N/A 05/17/2016   Procedure: CORONARY ARTERY BYPASS GRAFTING (CABG) x 6 using left mammory artery and right greater saphenous vein harvested endoscopically. [place] to LAD, SVG to Diagonal, SVG Sequential to OM2 and ramus intermediate, SVG Sequential to RCA and PDA;  Surgeon: [name], MD;  Location: [facility];  Service: Open Heart Surgery;  Laterality: N/A;  . KNEE ARTHROSCOPY     right knee  . LUMBAR LAMINECTOMY/DECOMPRESSION MICRODISCECTOMY Bilateral 04/10/2015   Procedure: LUMBAR LAMINECTOMY/DECOMPRESSION MICRODISCECTOMY 2 LEVELS;  Surgeon: Ashok Pall, MD;  Location: [facility];  Service: Neurosurgery;  Laterality: Bilateral;  Bilateral L45 L5S1 Laminectomy and Foraminotomy  . ROTATOR CUFF REPAIR Right   . SKIN CANCER EXCISION     melanoma on scalp  . TEE WITHOUT CARDIOVERSION N/A 05/17/2016   Procedure: TRANSESOPHAGEAL ECHOCARDIOGRAM (TEE);  Surgeon: [name], MD;  Location: [facility];  Service: Open Heart Surgery;  Laterality: N/A;  . TONSILLECTOMY AND ADENOIDECTOMY    . WISDOM TOOTH EXTRACTION      Family History  Problem Relation Age of Onset  . Heart disease Father   . Emphysema Father   . Hypertension Father   . Cancer Father     lung/ healthy  . Heart  disease Mother     Deceased  . Cancer Mother     breast  . Hypertension Mother   . Cancer Maternal Grandmother     colon  . Diabetes Brother     type 2  . Heart disease Brother   . Pulmonary embolism Son     vasculiti  . Depression Paternal Grandmother     Social History   Social History  . Marital status: Married    Spouse name: N/A  . Number of children: 2  . Years of education: N/A   Occupational History  . Not on file.   Social History Main Topics  . Smoking status: Former Smoker    Packs/day: 2.00    Years: 30.00     Types: Cigarettes    Start date: 12/27/1983  . Smokeless tobacco: Never Used  . Alcohol use 0.0 oz/week     Comment: occasionally  . Drug use: No  . 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    Other Topics Concern  . Not on file   Social History Narrative  . No narrative on file    Outpatient Medications Prior to Visit  Medication Sig Dispense Refill  . aspirin 81 MG tablet Take 81 mg by mouth daily.    Marland Kitchen glucose blood (ONETOUCH VERIO) test strip Use as directed three times daily to check blood sugar.  DX E11.8 300 each 3  . levothyroxine (SYNTHROID, LEVOTHROID) 100 MCG tablet TAKE 1 TABLET (100 MCG TOTAL) BY MOUTH DAILY BEFORE BREAKFAST. 90 tablet 1  . metFORMIN (GLUCOPHAGE-XR) 500 MG 24 hr tablet Take 2 tablets (1,000 mg total) by mouth 2 (two) times daily. 360 tablet 2  . metoprolol succinate (TOPROL XL) 25 MG 24 hr tablet Take 0.5 tablets (12.5 mg total) by mouth daily. 15 tablet 6  . ONE TOUCH LANCETS MISC Use as directed three times daily to check blood sugar.  DX E11.8 300 each 3  . rosuvastatin (CRESTOR) 40 MG tablet Take 1 tablet (40 mg total) by mouth daily. 90 tablet 3  . glipiZIDE (GLUCOTROL) 5 MG tablet Take 2 tablets (10 mg total) by mouth 2 (two) times daily before a meal. (Patient taking differently: Take 5 mg by mouth 2 (two) times daily before a meal. ) 360 tablet 2   No facility-administered medications prior to visit.     No Known Allergies  Review of Systems  Constitutional: Negative for fever and malaise/fatigue.  HENT: Negative for congestion.   Eyes: Negative for blurred vision.  Respiratory: Negative for shortness of breath.   Cardiovascular: Negative for chest pain, palpitations and leg swelling.  Gastrointestinal: Negative for abdominal pain, blood in stool and nausea.  Genitourinary: Negative for dysuria and frequency.  Musculoskeletal: Negative for falls.  Skin: Negative for rash.    Neurological: Negative for dizziness, loss of consciousness and headaches.  Endo/Heme/Allergies: Negative for environmental allergies.  Psychiatric/Behavioral: Negative for depression. The patient is not nervous/anxious.        Objective:    Physical Exam  Constitutional: He is oriented to person, place, and time. He appears well-developed and well-nourished. No distress.  HENT:  Head: Normocephalic and atraumatic.  Eyes: Conjunctivae are normal.  Neck: Neck supple. No thyromegaly present.  Cardiovascular: Normal rate, regular rhythm and normal heart sounds.   Pulmonary/Chest: Effort normal and breath sounds normal. No respiratory distress. He has no wheezes.  Abdominal: Soft. Bowel sounds are normal. He exhibits no  mass. There is no tenderness.  Musculoskeletal: He exhibits no edema.  Lymphadenopathy:    He has no cervical adenopathy.  Neurological: He is alert and oriented to person, place, and time.  Skin: Skin is warm and dry.  Psychiatric: He has a normal mood and affect. His behavior is normal.    BP 114/63 (BP Location: Right Arm, Patient Position: Sitting, Cuff Size: Normal)   Pulse 69   Temp 98 F (36.7 C) (Oral)   Ht 6' (1.829 m)   Wt 198 lb 8 oz (90 kg)   SpO2 100%   BMI 26.92 kg/m  Wt Readings from Last 3 Encounters:  09/16/16 198 lb 8 oz (90 kg)  08/30/16 196 lb 8 oz (89.1 kg)  08/24/16 196 lb 12.8 oz (89.3 kg)     Lab Results  Component Value Date   WBC 11.6 (H) 05/27/2016   HGB 8.8 (L) 05/27/2016   HCT 28.3 (L) 05/27/2016   PLT 493 (H) 05/27/2016   GLUCOSE 125 (H) 07/08/2016   CHOL 73 (L) 08/25/2016   TRIG 93 08/25/2016   HDL 42 08/25/2016   LDLDIRECT 43.0 04/11/2016   LDLCALC 12 08/25/2016   ALT 12 08/25/2016   AST 18 08/25/2016   NA 138 07/08/2016   K 4.0 07/08/2016   CL 104 07/08/2016   CREATININE 1.35 07/08/2016   BUN 16 07/08/2016   CO2 25 07/08/2016   TSH 3.31 04/11/2016   INR 1.61 (H) 05/17/2016   HGBA1C 7.1 (H) 08/30/2016    MICROALBUR <0.7 04/11/2016    Lab Results  Component Value Date   TSH 3.31 04/11/2016   Lab Results  Component Value Date   WBC 11.6 (H) 05/27/2016   HGB 8.8 (L) 05/27/2016   HCT 28.3 (L) 05/27/2016   MCV 86.0 05/27/2016   PLT 493 (H) 05/27/2016   Lab Results  Component Value Date   NA 138 07/08/2016   K 4.0 07/08/2016   CO2 25 07/08/2016   GLUCOSE 125 (H) 07/08/2016   BUN 16 07/08/2016   CREATININE 1.35 07/08/2016   BILITOT 0.4 08/25/2016   ALKPHOS 52 08/25/2016   AST 18 08/25/2016   ALT 12 08/25/2016   PROT 6.9 08/25/2016   ALBUMIN 4.2 08/25/2016   CALCIUM 10.3 07/08/2016   ANIONGAP 6 05/23/2016   GFR 54.97 (L) 07/08/2016   Lab Results  Component Value Date   CHOL 73 (L) 08/25/2016   Lab Results  Component Value Date   HDL 42 08/25/2016   Lab Results  Component Value Date   LDLCALC 12 08/25/2016   Lab Results  Component Value Date   TRIG 93 08/25/2016   Lab Results  Component Value Date   CHOLHDL 1.7 08/25/2016   Lab Results  Component Value Date   HGBA1C 7.1 (H) 08/30/2016       Assessment & Plan:   Problem List Items Addressed This Visit    Hyperlipidemia    Tolerating statin, encouraged heart healthy diet, avoid trans fats, minimize simple carbs and saturated fats. Increase exercise as tolerated      Relevant Orders   Lipid panel   Hypothyroidism - Primary    On Levothyroxine, continue to monitor      Relevant Orders   TSH   HTN (hypertension)    Well controlled, no changes to meds. Encouraged heart healthy diet such as the DASH diet and exercise as tolerated. He feels the metoprolol is causing side effects. May want to consider Bystolic  Relevant Orders   Comprehensive metabolic panel   TSH   CBC   Preventative health care    Patient encouraged to maintain heart healthy diet, regular exercise, adequate sleep. Consider daily probiotics. Take medications as prescribed. Labs reviewed.       Type 2 diabetes with complication  (HCC)   Relevant Medications   glipiZIDE (GLUCOTROL) 5 MG tablet   Other Relevant Orders   Hemoglobin A1c    Other Visit Diagnoses   None.     I have changed Mr. Kasperski glipiZIDE. I am also having him maintain his aspirin, levothyroxine, rosuvastatin, metFORMIN, metoprolol succinate, glucose blood, and ONE TOUCH LANCETS.  Meds ordered this encounter  Medications  . glipiZIDE (GLUCOTROL) 5 MG tablet    Sig: 1 tab po in am and 1/2 tab po in pm    Dispense:  360 tablet    Refill:  2    D/C PREVIOUS SCRIPTS FOR THIS MEDICATION     Penni Homans, MD

## 2016-09-24 NOTE — Assessment & Plan Note (Signed)
On Levothyroxine, continue to monitor 

## 2016-09-24 NOTE — Assessment & Plan Note (Addendum)
Well controlled, no changes to meds. Encouraged heart healthy diet such as the DASH diet and exercise as tolerated. He feels the metoprolol is causing side effects. May want to consider Bystolic

## 2016-09-26 ENCOUNTER — Encounter (HOSPITAL_COMMUNITY)
Admission: RE | Admit: 2016-09-26 | Discharge: 2016-09-26 | Disposition: A | Discharge status: Home / Self Care | Payer: Medicare Other | Source: Ambulatory Visit | Attending: Cardiology | Admitting: Cardiology

## 2016-09-26 DIAGNOSIS — Z951 Presence of aortocoronary bypass graft: Secondary | ICD-10-CM | POA: Insufficient documentation

## 2016-09-26 DIAGNOSIS — E119 Type 2 diabetes mellitus without complications: Secondary | ICD-10-CM | POA: Insufficient documentation

## 2016-09-27 DIAGNOSIS — E119 Type 2 diabetes mellitus without complications: Secondary | ICD-10-CM | POA: Diagnosis not present

## 2016-09-28 ENCOUNTER — Encounter (HOSPITAL_COMMUNITY): Payer: Medicare Other

## 2016-09-28 ENCOUNTER — Encounter (HOSPITAL_COMMUNITY)
Admission: RE | Admit: 2016-09-28 | Discharge: 2016-09-28 | Disposition: A | Discharge status: Home / Self Care | Payer: Medicare Other | Source: Ambulatory Visit | Attending: Cardiology | Admitting: Cardiology

## 2016-09-28 DIAGNOSIS — Z951 Presence of aortocoronary bypass graft: Secondary | ICD-10-CM | POA: Diagnosis not present

## 2016-09-28 DIAGNOSIS — E119 Type 2 diabetes mellitus without complications: Secondary | ICD-10-CM | POA: Diagnosis not present

## 2016-09-29 DIAGNOSIS — M48061 Spinal stenosis, lumbar region without neurogenic claudication: Secondary | ICD-10-CM | POA: Diagnosis not present

## 2016-09-29 DIAGNOSIS — M5126 Other intervertebral disc displacement, lumbar region: Secondary | ICD-10-CM | POA: Diagnosis not present

## 2016-09-30 ENCOUNTER — Encounter (HOSPITAL_COMMUNITY)
Admission: RE | Admit: 2016-09-30 | Discharge: 2016-09-30 | Disposition: A | Discharge status: Home / Self Care | Payer: Medicare Other | Source: Ambulatory Visit | Attending: Cardiology | Admitting: Cardiology

## 2016-09-30 DIAGNOSIS — Z951 Presence of aortocoronary bypass graft: Secondary | ICD-10-CM

## 2016-09-30 DIAGNOSIS — E119 Type 2 diabetes mellitus without complications: Secondary | ICD-10-CM | POA: Diagnosis not present

## 2016-10-01 ENCOUNTER — Emergency Department (HOSPITAL_BASED_OUTPATIENT_CLINIC_OR_DEPARTMENT_OTHER)
Admission: EM | Admit: 2016-10-01 | Discharge: 2016-10-01 | Disposition: A | Discharge status: Home / Self Care | Payer: Medicare Other | Attending: Emergency Medicine | Admitting: Emergency Medicine

## 2016-10-01 ENCOUNTER — Encounter (HOSPITAL_BASED_OUTPATIENT_CLINIC_OR_DEPARTMENT_OTHER): Payer: Self-pay | Admitting: Emergency Medicine

## 2016-10-01 ENCOUNTER — Emergency Department (HOSPITAL_BASED_OUTPATIENT_CLINIC_OR_DEPARTMENT_OTHER): Payer: Medicare Other

## 2016-10-01 DIAGNOSIS — I2510 Atherosclerotic heart disease of native coronary artery without angina pectoris: Secondary | ICD-10-CM | POA: Insufficient documentation

## 2016-10-01 DIAGNOSIS — N201 Calculus of ureter: Secondary | ICD-10-CM | POA: Diagnosis not present

## 2016-10-01 DIAGNOSIS — N183 Chronic kidney disease, stage 3 (moderate): Secondary | ICD-10-CM | POA: Insufficient documentation

## 2016-10-01 DIAGNOSIS — Z7984 Long term (current) use of oral hypoglycemic drugs: Secondary | ICD-10-CM | POA: Diagnosis not present

## 2016-10-01 DIAGNOSIS — Z79899 Other long term (current) drug therapy: Secondary | ICD-10-CM | POA: Diagnosis not present

## 2016-10-01 DIAGNOSIS — E119 Type 2 diabetes mellitus without complications: Secondary | ICD-10-CM | POA: Diagnosis not present

## 2016-10-01 DIAGNOSIS — N132 Hydronephrosis with renal and ureteral calculous obstruction: Secondary | ICD-10-CM | POA: Diagnosis not present

## 2016-10-01 DIAGNOSIS — E039 Hypothyroidism, unspecified: Secondary | ICD-10-CM | POA: Insufficient documentation

## 2016-10-01 DIAGNOSIS — I129 Hypertensive chronic kidney disease with stage 1 through stage 4 chronic kidney disease, or unspecified chronic kidney disease: Secondary | ICD-10-CM | POA: Diagnosis not present

## 2016-10-01 DIAGNOSIS — Z87891 Personal history of nicotine dependence: Secondary | ICD-10-CM | POA: Insufficient documentation

## 2016-10-01 DIAGNOSIS — Z7982 Long term (current) use of aspirin: Secondary | ICD-10-CM | POA: Diagnosis not present

## 2016-10-01 DIAGNOSIS — R1030 Lower abdominal pain, unspecified: Secondary | ICD-10-CM | POA: Diagnosis present

## 2016-10-01 LAB — CBC WITH DIFFERENTIAL/PLATELET
Basophils Absolute: 0.1 10*3/uL (ref 0.0–0.1)
Basophils Relative: 1 %
Eosinophils Absolute: 0.4 10*3/uL (ref 0.0–0.7)
Eosinophils Relative: 4 %
HCT: 40 % (ref 39.0–52.0)
Hemoglobin: 12.9 g/dL — ABNORMAL LOW (ref 13.0–17.0)
Lymphocytes Relative: 14 %
Lymphs Abs: 1.3 10*3/uL (ref 0.7–4.0)
MCH: 25.2 pg — ABNORMAL LOW (ref 26.0–34.0)
MCHC: 32.3 g/dL (ref 30.0–36.0)
MCV: 78.1 fL (ref 78.0–100.0)
Monocytes Absolute: 1.1 10*3/uL — ABNORMAL HIGH (ref 0.1–1.0)
Monocytes Relative: 12 %
Neutro Abs: 6.4 10*3/uL (ref 1.7–7.7)
Neutrophils Relative %: 69 %
Platelets: 201 10*3/uL (ref 150–400)
RBC: 5.12 MIL/uL (ref 4.22–5.81)
RDW: 16.4 % — ABNORMAL HIGH (ref 11.5–15.5)
WBC: 9.2 10*3/uL (ref 4.0–10.5)

## 2016-10-01 LAB — BASIC METABOLIC PANEL
Anion gap: 7 (ref 5–15)
BUN: 22 mg/dL — ABNORMAL HIGH (ref 6–20)
CO2: 24 mmol/L (ref 22–32)
Calcium: 9.5 mg/dL (ref 8.9–10.3)
Chloride: 106 mmol/L (ref 101–111)
Creatinine, Ser: 1.66 mg/dL — ABNORMAL HIGH (ref 0.61–1.24)
GFR calc Af Amer: 46 mL/min — ABNORMAL LOW (ref >60)
GFR calc non Af Amer: 39 mL/min — ABNORMAL LOW (ref >60)
Glucose, Bld: 147 mg/dL — ABNORMAL HIGH (ref 65–99)
Potassium: 4.3 mmol/L (ref 3.5–5.1)
Sodium: 137 mmol/L (ref 135–145)

## 2016-10-01 LAB — URINE MICROSCOPIC-ADD ON
Squamous Epithelial / LPF: NONE SEEN
WBC, UA: NONE SEEN WBC/hpf (ref 0–5)

## 2016-10-01 LAB — URINALYSIS, ROUTINE W REFLEX MICROSCOPIC
Bilirubin Urine: NEGATIVE
Glucose, UA: NEGATIVE mg/dL
Ketones, ur: NEGATIVE mg/dL
Leukocytes, UA: NEGATIVE
Nitrite: NEGATIVE
Protein, ur: 30 mg/dL — AB
Specific Gravity, Urine: 1.013 (ref 1.005–1.030)
pH: 5 (ref 5.0–8.0)

## 2016-10-01 MED ORDER — TAMSULOSIN HCL 0.4 MG PO CAPS: 0.4000 mg | ORAL_CAPSULE | Freq: Every day | ORAL | 0 refills | Status: DC

## 2016-10-01 MED ORDER — SODIUM CHLORIDE 0.9 % IV BOLUS (SEPSIS)
1000.0000 mL | Freq: Once | INTRAVENOUS | Status: AC
  Administered 2016-10-01: 1000 mL via INTRAVENOUS

## 2016-10-01 NOTE — ED Provider Notes (Signed)
[facility]PT [facility] Provider Note   CSN: [id number] Arrival date & time: 10/01/16  0845     History   Chief Complaint Chief Complaint  Patient presents with  . Urinary Retention    HPI Jack WITTEMAN is a 75 y.o. male.  HPI 75 year old male presents with lower abdominal pain and left flank pain. Patient states that last night sometime after dinner he started feeling mild lower abdominal pain. Has had that pain all night but then this morning around 5 AM started noticing some left flank pain. Describes the pain as mild to moderate. No nausea, vomiting, or fevers. No dysuria but he states he has had difficulty urinating since sometime last night. He last urinated normally in the afternoon. When ever he urinates it is a very small amount and he seems to have to go frequently. It does not hurt. Has not no skin he blood. He states several years ago he had a kidney stone but does not room when it felt like.  Past Medical History:  Diagnosis Date  . Aortic calcification ([place]) 02/09/2015  . Appendicitis   . Back pain 02/09/2015  . CAD (coronary artery disease)    2 stents  . Chicken pox   . Diabetes mellitus type 2 in obese ([place]) 04/11/2016  . Diabetes type 2, controlled ([place])   . Dysphagia, unspecified(787.20) 03/20/2014  . Hereditary and idiopathic peripheral neuropathy 02/09/2015  . Hyperlipidemia   . Hypertension   . Hypothyroidism   . Measles   . Medicare annual wellness visit, subsequent 06/14/2015  . Melanoma ([place])    Scalp. 2012  . Mumps as a child  . Thyroid disease    [place] Hyperthyroidism    Patient Active Problem List   Diagnosis Date Noted  . Fatigue 06/06/2016  . Chronic renal disease, stage III 06/06/2016  . Strain of left biceps 05/11/2016  . Angina decubitus ([place])   . Type 2 diabetes with complication ([place]) [id number]  . Left arm pain 07/14/2015  . Medicare annual wellness visit, subsequent 06/14/2015  . HNP (herniated nucleus pulposus), lumbar  04/10/2015  . Peripheral vascular disease ([place]) 03/18/2015  . Hereditary and idiopathic peripheral neuropathy 02/09/2015  . Back pain 02/09/2015  . Aortic calcification ([place]) 02/09/2015  . Vertigo 04/04/2014  . Hx of CABG 05/17/16 03/23/2014  . HTN (hypertension) 03/23/2014  . H/O tobacco use, presenting hazards to health 03/23/2014  . Preventative health care 03/23/2014  . Pain in joint, ankle and foot 03/23/2014  . Dysphagia, unspecified(787.20) 03/20/2014  . Melanoma ([place])   . Hypothyroidism   . Hyperlipidemia 12/22/2013    Past Surgical History:  Procedure Laterality Date  . APPENDECTOMY    . BACK SURGERY     x2 lumbar  . BACK SURGERY     late 1990s had 2 surgeries, first surgery lifted a heavy engine ruptured disds, at L4 and L5, cleaned discs no hardware very helpful. 2 years later while recovering from angiogram with weights applied to left leg caused a recurrence and required surgery again in same area with disc repair  . CARDIAC CATHETERIZATION    . CARDIAC CATHETERIZATION N/A 05/11/2016   Procedure: Left Heart Cath and Coronary Angiography;  Surgeon: Belva Crome, MD;  Location: [facility];  Service: Cardiovascular;  Laterality: N/A;  . CARDIAC CATHETERIZATION N/A 05/11/2016   Procedure: Intravascular Pressure Wire/FFR Study;  Surgeon: Belva Crome, MD;  Location: [facility];  Service: Cardiovascular;  Laterality: N/A;  .  CORONARY ANGIOPLASTY WITH STENT PLACEMENT     2 stents  . CORONARY ARTERY BYPASS GRAFT N/A 05/17/2016   Procedure: CORONARY ARTERY BYPASS GRAFTING (CABG) x 6 using left mammory artery and right greater saphenous vein harvested endoscopically. [place] to LAD, SVG to Diagonal, SVG Sequential to OM2 and ramus intermediate, SVG Sequential to RCA and PDA;  Surgeon: [name], MD;  Location: [facility];  Service: Open Heart Surgery;  Laterality: N/A;  . KNEE ARTHROSCOPY     right knee  . LUMBAR LAMINECTOMY/DECOMPRESSION MICRODISCECTOMY Bilateral  04/10/2015   Procedure: LUMBAR LAMINECTOMY/DECOMPRESSION MICRODISCECTOMY 2 LEVELS;  Surgeon: Ashok Pall, MD;  Location: [facility];  Service: Neurosurgery;  Laterality: Bilateral;  Bilateral L45 L5S1 Laminectomy and Foraminotomy  . ROTATOR CUFF REPAIR Right   . SKIN CANCER EXCISION     melanoma on scalp  . TEE WITHOUT CARDIOVERSION N/A 05/17/2016   Procedure: TRANSESOPHAGEAL ECHOCARDIOGRAM (TEE);  Surgeon: [name], MD;  Location: [facility];  Service: Open Heart Surgery;  Laterality: N/A;  . TONSILLECTOMY AND ADENOIDECTOMY    . WISDOM TOOTH EXTRACTION         Home Medications    Prior to Admission medications   Medication Sig Start Date End Date Taking? Authorizing Provider  aspirin 81 MG tablet Take 81 mg by mouth daily.   Yes Historical Provider, MD  glipiZIDE (GLUCOTROL) 5 MG tablet 1 tab po in am and 1/2 tab po in pm 09/16/16  Yes Mosie Lukes, MD  glucose blood (ONETOUCH VERIO) test strip Use as directed three times daily to check blood sugar.  DX E11.8 08/30/16  Yes Mosie Lukes, MD  levothyroxine (SYNTHROID, LEVOTHROID) 100 MCG tablet TAKE 1 TABLET (100 MCG TOTAL) BY MOUTH DAILY BEFORE BREAKFAST. 03/03/15  Yes Mosie Lukes, MD  metFORMIN (GLUCOPHAGE-XR) 500 MG 24 hr tablet Take 2 tablets (1,000 mg total) by mouth 2 (two) times daily. 05/25/16  Yes Donielle Liston Alba, PA-C  metoprolol succinate (TOPROL XL) 25 MG 24 hr tablet Take 0.5 tablets (12.5 mg total) by mouth daily. 08/04/16  Yes Lelon Perla, MD  ONE TOUCH LANCETS MISC Use as directed three times daily to check blood sugar.  DX E11.8 08/30/16  Yes Mosie Lukes, MD  rosuvastatin (CRESTOR) 40 MG tablet Take 1 tablet (40 mg total) by mouth daily. 05/09/16  Yes Lelon Perla, MD  tamsulosin (FLOMAX) 0.4 MG CAPS capsule Take 1 capsule (0.4 mg total) by mouth daily. 10/01/16   Sherwood Gambler, MD    Family History Family History  Problem Relation Age of Onset  . Heart disease Father   . Emphysema Father   .  Hypertension Father   . Cancer Father     lung/ healthy  . Heart disease Mother     Deceased  . Cancer Mother     breast  . Hypertension Mother   . Cancer Maternal Grandmother     colon  . Diabetes Brother     type 2  . Heart disease Brother   . Pulmonary embolism Son     vasculiti  . Depression Paternal Grandmother     Social History Social History  Substance Use Topics  . Smoking status: Former Smoker    Packs/day: 2.00    Years: 30.00    Types: Cigarettes    Start date: 12/27/1983  . Smokeless tobacco: Never Used  . Alcohol use 0.0 oz/week     Comment: occasionally     Allergies  Review of patient's allergies indicates no known allergies.   Review of Systems Review of Systems  Constitutional: Negative for fever.  Respiratory: Negative for shortness of breath.   Cardiovascular: Negative for chest pain.  Gastrointestinal: Positive for abdominal pain. Negative for nausea and vomiting.  Genitourinary: Positive for decreased urine volume, difficulty urinating and flank pain. Negative for dysuria. Frequency: left.  All other systems reviewed and are negative.    Physical Exam Updated Vital Signs BP 130/69 (BP Location: Left Arm)   Pulse 60   Temp 98.4 F (36.9 C) (Oral)   Resp 18   Ht 6' (1.829 m)   Wt 190 lb (86.2 kg)   SpO2 100%   BMI 25.77 kg/m   Physical Exam  Constitutional: He is oriented to person, place, and time. He appears well-developed and well-nourished. No distress.  HENT:  Head: Normocephalic and atraumatic.  Right Ear: External ear normal.  Left Ear: External ear normal.  Nose: Nose normal.  Eyes: Right eye exhibits no discharge. Left eye exhibits no discharge.  Neck: Neck supple.  Cardiovascular: Normal rate, regular rhythm and normal heart sounds.   Pulmonary/Chest: Effort normal and breath sounds normal.  Abdominal: Soft. There is tenderness (mild) in the suprapubic area. There is no CVA tenderness.  Musculoskeletal: He exhibits  no edema.  Neurological: He is alert and oriented to person, place, and time.  Skin: Skin is warm and dry. He is not diaphoretic.  No rash or obvious vesicular lesions  Nursing note and vitals reviewed.    ED Treatments / Results  Labs (all labs ordered are listed, but only abnormal results are displayed) Labs Reviewed  BASIC METABOLIC PANEL - Abnormal; Notable for the following:       Result Value   Glucose, Bld 147 (*)    BUN 22 (*)    Creatinine, Ser 1.66 (*)    GFR calc non Af Amer 39 (*)    GFR calc Af Amer 46 (*)    All other components within normal limits  CBC WITH DIFFERENTIAL/PLATELET - Abnormal; Notable for the following:    Hemoglobin 12.9 (*)    MCH 25.2 (*)    RDW 16.4 (*)    Monocytes Absolute 1.1 (*)    All other components within normal limits  URINALYSIS, ROUTINE W REFLEX MICROSCOPIC (NOT AT [facility]) - Abnormal; Notable for the following:    APPearance CLOUDY (*)    Hgb urine dipstick LARGE (*)    Protein, ur 30 (*)    All other components within normal limits  URINE MICROSCOPIC-ADD ON - Abnormal; Notable for the following:    Bacteria, UA FEW (*)    All other components within normal limits    EKG  EKG Interpretation None       Radiology Ct Renal Stone Study  Result Date: 10/01/2016 CLINICAL DATA:  Cholelithiasis. EXAM: CT ABDOMEN AND PELVIS WITHOUT CONTRAST TECHNIQUE: Multidetector CT imaging of the abdomen and pelvis was performed following the standard protocol without oral or intravenous contrast material administration. COMPARISON:  None FINDINGS: Lower chest: There is slight atelectasis in the anterior left base. Lung bases otherwise are clear. There are foci of coronary artery calcification evident. Hepatobiliary: No focal liver lesions are evident on this noncontrast enhanced study. Within the gallbladder, there is cholelithiasis. Gallbladder wall is not appreciably thickened. There is no biliary duct dilatation. Pancreas: There is no pancreatic  mass or inflammatory focus. Spleen: No splenic lesions are evident. Adrenals/Urinary Tract: Adrenals appear normal bilaterally. There  is no renal mass on either side. The left kidney is diffusely edematous with stranding throughout the perinephric fascia on the left. There is moderate hydronephrosis on the left. There is no hydronephrosis on the right. There is no intrarenal calculus on either side. There is a 2 mm calculus on the left at the ureterovesical junction causing hydronephrosis and ureterectasis on the left. No other ureteral calculi are evident on either side. There is diffuse thickening of the urinary bladder wall consistent with cystitis. Stomach/Bowel: There are scattered sigmoid diverticula without diverticulitis. There is no appreciable bowel wall or mesenteric thickening. There is no evident bowel obstruction. No free air or portal venous air. Vascular/Lymphatic: There is extensive atherosclerotic calcification throughout the aorta and iliac arteries. There are multiple foci of calcification in mesenteric arterial vessels, most notably in the right upper quadrant and peripancreatic region. The proximal mesenteric vessels appear patent. There is moderate calcification in both distal common femoral arteries and in the proximal superficial femoral and profunda femoral arteries. There is no evident adenopathy in the abdomen or pelvis. Reproductive: Prostate and seminal vesicles appear unremarkable. No pelvic mass or pelvic fluid collection. Other: Appendix is absent. There is no ascites or abscess in the abdomen or pelvis. Musculoskeletal: There is moderate generalized spinal stenosis at L3-4 due to diffuse bony hypertrophy and disc protrusion. There is multilevel osteoarthritic change in the lumbar spine. There is vacuum phenomenon at L5-S1. The patient has had a hemilaminotomy at L5-S1 on the left with what appears to be scar tissue on the left in this area. There are no evident blastic or lytic bone  lesions. There is no intramuscular or abdominal wall lesion. IMPRESSION: 2 mm calculus at the left ureterovesical junction causing moderate hydronephrosis and ureterectasis on the left. There is left-sided perinephric stranding and left renal edema. Generalized urinary bladder wall thickening consistent with cystitis. No bowel obstruction.  No abscess.  Appendix absent. Aortoiliac atherosclerosis. Multiple peripheral arterial vascular calcifications. No abdominal aortic aneurysm. There are foci of coronary artery calcification noted. Cholelithiasis. Spinal stenosis at L3-4, multifactorial. There is degenerative change throughout the lumbar spine. There is postoperative change on the left at L5-S1 with what appears to be scarring in the perioperative region on the left at this level. Electronically Signed   By: Lowella Grip III M.D.   On: 10/01/2016 10:57    Procedures Procedures (including critical care time)  Medications Ordered in ED Medications  sodium chloride 0.9 % bolus 1,000 mL (not administered)  sodium chloride 0.9 % bolus 1,000 mL (1,000 mLs Intravenous New Bag/Given 10/01/16 1045)     Initial Impression / Assessment and Plan / ED Course  I have reviewed the triage vital signs and the nursing notes.  Pertinent labs & imaging results that were available during my care of the patient were reviewed by me and considered in my medical decision making (see chart for details).  Clinical Course  Comment By Time  Given the acute change in urination and lower abd pain, will place foley and eval for retention. Labs, urine. If flank pain is not improved will need CT. Sherwood Gambler, MD 10/07 0913  Only around 30 cc came out in initial foley placement. Will remove foley and get CT to r/o stone, could be UTI as well. Declines pain meds. Fluids Sherwood Gambler, MD 10/07 [phone]  CT shows 2 mm left UVJ stone. Cr a little higher than baseline but not AKI (1.3 to 1.6). Push fluids at home, IV fluids  here,  and f/u with urology. D/c with flomax.  Sherwood Gambler, MD 10/07 1127    Patient does not have urinary retention, likely decreased urine output from the stone. However he does not have bilateral blockage or other concerning findings. Mildly increased creatinine, increase fluids. Will up with urology and or PCP. Discussed return precautions.  Final Clinical Impressions(s) / ED Diagnoses   Final diagnoses:  Left ureteral stone    New Prescriptions New Prescriptions   TAMSULOSIN (FLOMAX) 0.4 MG CAPS CAPSULE    Take 1 capsule (0.4 mg total) by mouth daily.     Sherwood Gambler, MD 10/01/16 1130

## 2016-10-01 NOTE — ED Triage Notes (Signed)
Since last night c/o of urinary retention and discomfort to bladder area and right flank this am around 05 00, denies n/v

## 2016-10-03 ENCOUNTER — Encounter (HOSPITAL_COMMUNITY): Payer: Medicare Other

## 2016-10-03 ENCOUNTER — Encounter (HOSPITAL_COMMUNITY)
Admission: RE | Admit: 2016-10-03 | Discharge: 2016-10-03 | Disposition: A | Discharge status: Home / Self Care | Payer: Medicare Other | Source: Ambulatory Visit | Attending: Cardiology | Admitting: Cardiology

## 2016-10-03 DIAGNOSIS — M48061 Spinal stenosis, lumbar region without neurogenic claudication: Secondary | ICD-10-CM | POA: Diagnosis not present

## 2016-10-03 DIAGNOSIS — E119 Type 2 diabetes mellitus without complications: Secondary | ICD-10-CM | POA: Diagnosis not present

## 2016-10-03 DIAGNOSIS — Z6826 Body mass index (BMI) 26.0-26.9, adult: Secondary | ICD-10-CM | POA: Diagnosis not present

## 2016-10-03 DIAGNOSIS — M5127 Other intervertebral disc displacement, lumbosacral region: Secondary | ICD-10-CM | POA: Diagnosis not present

## 2016-10-03 DIAGNOSIS — Z951 Presence of aortocoronary bypass graft: Secondary | ICD-10-CM | POA: Diagnosis not present

## 2016-10-04 NOTE — Progress Notes (Signed)
Cardiac Individual Treatment Plan  Patient Details  Name: Jack Wade MRN: [id number] Date of Birth: 1941-01-12 Referring Provider:   Flowsheet Row CARDIAC REHAB PHASE II ORIENTATION from 07/14/2016 in [place]  Referring Provider  Kirk Ruths MD      Initial Encounter Date:  [place] PHASE II ORIENTATION from 07/14/2016 in [facility]  Date  07/14/16  Referring Provider  Kirk Ruths MD      Visit Diagnosis: 05/17/16 S/P CABG x 6  Patient's Home Medications on Admission:  Current Outpatient Prescriptions:  .  aspirin 81 MG tablet, Take 81 mg by mouth daily., Disp: , Rfl:  .  glipiZIDE (GLUCOTROL) 5 MG tablet, 1 tab po in am and 1/2 tab po in pm, Disp: 360 tablet, Rfl: 2 .  glucose blood (ONETOUCH VERIO) test strip, Use as directed three times daily to check blood sugar.  DX E11.8, Disp: 300 each, Rfl: 3 .  levothyroxine (SYNTHROID, LEVOTHROID) 100 MCG tablet, TAKE 1 TABLET (100 MCG TOTAL) BY MOUTH DAILY BEFORE BREAKFAST., Disp: 90 tablet, Rfl: 1 .  metFORMIN (GLUCOPHAGE-XR) 500 MG 24 hr tablet, Take 2 tablets (1,000 mg total) by mouth 2 (two) times daily., Disp: 360 tablet, Rfl: 2 .  metoprolol succinate (TOPROL XL) 25 MG 24 hr tablet, Take 0.5 tablets (12.5 mg total) by mouth daily., Disp: 15 tablet, Rfl: 6 .  ONE TOUCH LANCETS MISC, Use as directed three times daily to check blood sugar.  DX E11.8, Disp: 300 each, Rfl: 3 .  rosuvastatin (CRESTOR) 40 MG tablet, Take 1 tablet (40 mg total) by mouth daily., Disp: 90 tablet, Rfl: 3 .  tamsulosin (FLOMAX) 0.4 MG CAPS capsule, Take 1 capsule (0.4 mg total) by mouth daily., Disp: 5 capsule, Rfl: 0  Past Medical History: Past Medical History:  Diagnosis Date  . Aortic calcification ([place]) 02/09/2015  . Appendicitis   . Back pain 02/09/2015  . CAD (coronary artery disease)    2 stents  . Chicken pox   . Diabetes mellitus type 2 in obese  ([place]) 04/11/2016  . Diabetes type 2, controlled ([place])   . Dysphagia, unspecified(787.20) 03/20/2014  . Hereditary and idiopathic peripheral neuropathy 02/09/2015  . Hyperlipidemia   . Hypertension   . Hypothyroidism   . Measles   . Medicare annual wellness visit, subsequent 06/14/2015  . Melanoma ([place])    Scalp. 2012  . Mumps as a child  . Thyroid disease    [place] Hyperthyroidism    Tobacco Use: History  Smoking Status  . Former Smoker  . Packs/day: 2.00  . Years: 30.00  . Types: Cigarettes  . Start date: 12/27/1983  Smokeless Tobacco  . Never Used    Labs: Recent Review Flowsheet Data    Labs for ITP Cardiac and Pulmonary Rehab Latest Ref Rng & Units 05/17/2016 05/17/2016 05/18/2016 08/25/2016 08/30/2016   Cholestrol 125 - 200 mg/dL - - - 73(L) -   LDLCALC <130 mg/dL - - - 12 -   LDLDIRECT mg/dL - - - - -   HDL >=40 mg/dL - - - 42 -   Trlycerides <150 mg/dL - - - 93 -   Hemoglobin A1c 4.6 - 6.5 % - - - - 7.1(H)   PHART 7.350 - 7.450 7.404 7.418 - - -   PCO2ART 35.0 - 45.0 mmHg 40.3 36.2 - - -   HCO3 20.0 - 24.0 mEq/L 24.9(H) 23.1 - - -   TCO2 0 -  100 mmol/L 26 24 21  - -   ACIDBASEDEF 0.0 - 2.0 mmol/L - 1.0 - - -   O2SAT % 99.0 97.0 - - -      Capillary Blood Glucose: Lab Results  Component Value Date   GLUCAP 157 (H) 08/19/2016   GLUCAP 192 (H) 07/29/2016   GLUCAP 159 (H) 07/27/2016   GLUCAP 193 (H) 07/25/2016   GLUCAP 103 (H) 07/22/2016     Exercise Target Goals:    Exercise Program Goal: Individual exercise prescription set with THRR, safety & activity barriers. Participant demonstrates ability to understand and report RPE using BORG scale, to self-measure pulse accurately, and to acknowledge the importance of the exercise prescription.  Exercise Prescription Goal: Starting with aerobic activity 30 plus minutes a day, 3 days per week for initial exercise prescription. Provide home exercise prescription and guidelines that participant acknowledges understanding  prior to discharge.  Activity Barriers & Risk Stratification:     Activity Barriers & Cardiac Risk Stratification - 07/14/16 0829      Activity Barriers & Cardiac Risk Stratification   Activity Barriers Back Problems;Other (comment)   Comments L leg is weaker than R, unable to stand on toes on L leg,  3 back surgeries in the past   Cardiac Risk Stratification High      6 Minute Walk:     6 Minute Walk    Row Name 07/14/16 1343 10/03/16 1723       6 Minute Walk   Phase Initial Discharge    Distance 1230 feet 1554 feet    Distance % Change  - 26.34 %    Walk Time 6 minutes 6 minutes    # of Rest Breaks 0 0    MPH 2.3 2.94    METS 2.8 3.64    RPE 11 10    VO2 Peak 9.8 12.74    Symptoms No No    Resting HR 96 bpm 99 bpm    Resting BP 102/62 98/64    Max Ex. HR 133 bpm 137 bpm    Max Ex. BP 104/70 130/62    2 Minute Post BP 228/74  -       Initial Exercise Prescription:     Initial Exercise Prescription - 07/14/16 1300      Date of Initial Exercise RX and Referring Provider   Date 07/14/16   Referring Provider Kirk Ruths MD     Recumbant Bike   Level 2   Minutes 10   METs 2     NuStep   Level 3   Minutes 10   METs 2     Track   Laps 8   Minutes 10   METs 2.74     Prescription Details   Frequency (times per week) 3   Duration Progress to 30 minutes of continuous aerobic without signs/symptoms of physical distress     Intensity   THRR 40-80% of Max Heartrate 59-118   Ratings of Perceived Exertion 11-13   Perceived Dyspnea 0-4     Progression   Progression Continue to progress workloads to maintain intensity without signs/symptoms of physical distress.     Resistance Training   Training Prescription Yes   Weight 2lb   Reps 10-12      Perform Capillary Blood Glucose checks as needed.  Exercise Prescription Changes:      Exercise Prescription Changes    Row Name 07/25/16 1700 08/12/16 0800 09/02/16 1600 09/23/16 1400 10/03/16 1700  Exercise Review   Progression Yes Yes Yes Yes Yes     Response to Exercise   Blood Pressure (Admit) 92/72 100/60 118/70 108/62 104/60   Blood Pressure (Exercise) 138/76 150/70 170/82 140/82 148/68   Blood Pressure (Exit) 112/70 118/72 110/64 104/60 112/58   Heart Rate (Admit) 90 bpm 83 bpm 80 bpm 81 bpm 71 bpm   Heart Rate (Exercise) 136 bpm 127 bpm 124 bpm 121 bpm 129 bpm   Heart Rate (Exit) 100 bpm 91 bpm 89 bpm 92 bpm 75 bpm   Rating of Perceived Exertion (Exercise) 11 11 12 11 10    Symptoms none none none none none   Comments  - Reviewed home exercise guidelines on 07/27/16. Reviewed home exercise guidelines on 07/27/16. Reviewed home exercise guidelines on 07/27/16. Reviewed home exercise guidelines on 07/27/16.   Duration Progress to 30 minutes of continuous aerobic without signs/symptoms of physical distress Progress to 30 minutes of continuous aerobic without signs/symptoms of physical distress Progress to 30 minutes of continuous aerobic without signs/symptoms of physical distress Progress to 30 minutes of continuous aerobic without signs/symptoms of physical distress Progress to 30 minutes of continuous aerobic without signs/symptoms of physical distress   Intensity THRR unchanged THRR unchanged THRR unchanged THRR unchanged THRR unchanged     Progression   Progression Continue to progress workloads to maintain intensity without signs/symptoms of physical distress. Continue to progress workloads to maintain intensity without signs/symptoms of physical distress. Continue to progress workloads to maintain intensity without signs/symptoms of physical distress. Continue to progress workloads to maintain intensity without signs/symptoms of physical distress. Continue to progress workloads to maintain intensity without signs/symptoms of physical distress.   Average METs 3.8 3.9 4 4.2 4.4     Resistance Training   Training Prescription Yes Yes Yes Yes Yes   Weight 2lb 2lb 5lbs 5lbs 5lbs    Reps 10-12 10-12 10-12 10-12 10-12     Interval Training   Interval Training No No No No No     Recumbant Bike   Level 3 3 -  -  -   Watts  - 50 -  -  -   Minutes 10 10 -  -  -   METs 2 3.75 -  -  -     NuStep   Level 3 4 4 6 6    Minutes 10 10 10 10 10    METs 3.8 4.3 4.2 4.7 4.8     Arm Ergometer   Level  - 4 4 4 4    Watts  -  - 49 40 39   Minutes  - 10 10 10 10    METs  - 2.5 4 3.4 3.5     Rower   Level  -  -  -  - 3   Watts  -  -  -  - 40   Minutes  -  -  -  - 10   METs  -  -  -  - 5.2     Track   Laps 13 16 16 16 18    Minutes 10 10 10 10 10    METs 3.26 3.79 3.79 3.79 4.13     Home Exercise Plan   Plans to continue exercise at  - [place]   Frequency  - Add 4 additional days to program exercise sessions. Add 4 additional days to program exercise sessions. Add 4 additional days to program exercise sessions. Add 4 additional days to program exercise sessions.  Exercise Comments:      Exercise Comments    Row Name 07/25/16 1748 08/08/16 1621 09/02/16 1640 09/23/16 1452 10/03/16 1717   Exercise Comments Off to a good start with exerciase. Doing well with exercise. Pt walking ~1.2 miles 2 days per week outside of cardiac rehab. Pt would like to strengthen upper body. Will switch from recumbent bike to upper body arm ergometer at cardiac rehab. Pt is now walking 6 miles 2 days per week and 3 miles the other days he's not at cardiac rehab. Doing well with exercise, would like to try rower next week. Making excellent progress with exercise.      Discharge Exercise Prescription (Final Exercise Prescription Changes):     Exercise Prescription Changes - 10/03/16 1700      Exercise Review   Progression Yes     Response to Exercise   Blood Pressure (Admit) 104/60   Blood Pressure (Exercise) 148/68   Blood Pressure (Exit) 112/58   Heart Rate (Admit) 71 bpm   Heart Rate (Exercise) 129 bpm   Heart Rate (Exit) 75 bpm   Rating of Perceived Exertion  (Exercise) 10   Symptoms none   Comments Reviewed home exercise guidelines on 07/27/16.   Duration Progress to 30 minutes of continuous aerobic without signs/symptoms of physical distress   Intensity THRR unchanged     Progression   Progression Continue to progress workloads to maintain intensity without signs/symptoms of physical distress.   Average METs 4.4     Resistance Training   Training Prescription Yes   Weight 5lbs   Reps 10-12     Interval Training   Interval Training No     NuStep   Level 6   Minutes 10   METs 4.8     Arm Ergometer   Level 4   Watts 39   Minutes 10   METs 3.5     Rower   Level 3   Watts 40   Minutes 10   METs 5.2     Track   Laps 18   Minutes 10   METs 4.13     Home Exercise Plan   Plans to continue exercise at Home   Frequency Add 4 additional days to program exercise sessions.      Nutrition:  Target Goals: Understanding of nutrition guidelines, daily intake of sodium 1500mg , cholesterol 200mg , calories 30% from fat and 7% or less from saturated fats, daily to have 5 or more servings of fruits and vegetables.  Biometrics:     Pre Biometrics - 07/14/16 1346      Pre Biometrics   Waist Circumference 38.75 inches   Hip Circumference 41 inches   Waist to Hip Ratio 0.95 %   Triceps Skinfold 16 mm   % Body Fat 26.6 %   Grip Strength 42 kg   Flexibility 0 in   Single Leg Stand 24.94 seconds       Nutrition Therapy Plan and Nutrition Goals:     Nutrition Therapy & Goals - 07/21/16 1443      Nutrition Therapy   Diet Carb Modified, Therapeutic Lifestyle Change     Personal Nutrition Goals   Personal Goal #1 Maintain wt while in Cardiac Rehab   Personal Goal #2 Improved glycemic control as evidenced by an improved A1c to a goal of 7.0 or less     Intervention Plan   Intervention Prescribe, educate and counsel regarding individualized specific dietary modifications aiming towards targeted core components such as  weight,  hypertension, lipid management, diabetes, heart failure and other comorbidities.   Expected Outcomes Short Term Goal: Understand basic principles of dietary content, such as calories, fat, sodium, cholesterol and nutrients.;Long Term Goal: Adherence to prescribed nutrition plan.      Nutrition Discharge: Nutrition Scores:     Nutrition Assessments - 07/21/16 1442      MEDFICTS Scores   Pre Score 36  will verify score with pt      Nutrition Goals Re-Evaluation:   Psychosocial: Target Goals: Acknowledge presence or absence of depression, maximize coping skills, provide positive support system. Participant is able to verbalize types and ability to use techniques and skills needed for reducing stress and depression.  Initial Review & Psychosocial Screening:     Initial Psych Review & Screening - 07/15/16 [place]? Yes     Barriers   Psychosocial barriers to participate in program There are no identifiable barriers or psychosocial needs.;The patient should benefit from training in stress management and relaxation.     Screening Interventions   Interventions Encouraged to exercise      Quality of Life Scores:     Quality of Life - 07/14/16 1350      Quality of Life Scores   Health/Function Pre 28.04 %   Socioeconomic Pre 27.17 %   Psych/Spiritual Pre 26.14 %   Family Pre 30 %   GLOBAL Pre 27.76 %      PHQ-9: Recent Review Flowsheet Data    Depression screen [facility] 2/9 07/18/2016 07/14/2015   Decreased Interest 0 0   Down, Depressed, Hopeless 0 0   PHQ - 2 Score 0 0      Psychosocial Evaluation and Intervention:   Psychosocial Re-Evaluation:     Psychosocial Re-Evaluation    Row Name 08/11/16 0932 09/09/16 1456 10/04/16 1235         Psychosocial Re-Evaluation   Interventions Encouraged to attend Cardiac Rehabilitation for the exercise Encouraged to attend Cardiac Rehabilitation for the exercise Encouraged to attend  Cardiac Rehabilitation for the exercise     Continued Psychosocial Services Needed No No No        Vocational Rehabilitation: Provide vocational rehab assistance to qualifying candidates.   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 Participant will state understanding/return demonstration of topics presented.  Learning Barriers/Preferences:     Learning Barriers/Preferences - 07/14/16 0830      Learning Barriers/Preferences   Learning Barriers Sight   Learning Preferences Skilled Demonstration      Education Topics: Count Your Pulse:  -Group instruction provided by verbal instruction, demonstration, patient participation and written materials to support subject.  Instructors address importance of being able to find your pulse and how to count your pulse when at home without a heart monitor.  Patients get hands on experience counting their pulse with staff help and individually.   Heart Attack, Angina, and Risk Factor Modification:  -Group instruction provided by verbal instruction, video, and written materials to support subject.  Instructors address signs and symptoms of angina and heart attacks.  Also discuss risk factors for heart disease and how to make changes to improve heart health risk factors.   Functional Fitness:  -Group instruction provided by verbal instruction, demonstration, patient participation, and written materials to support subject.  Instructors address safety measures for doing things around the house.  Discuss how to get up and down off the floor, how to pick things up  properly, how to safely get out of a chair without assistance, and balance training.   Meditation and Mindfulness:  -Group instruction provided by verbal instruction, patient participation, and written materials to support subject.  Instructor addresses importance of mindfulness and meditation practice to help reduce stress and improve awareness.  Instructor also leads participants through a meditation exercise.    Stretching for Flexibility and Mobility:  -Group instruction provided by verbal instruction, patient participation, and written materials to support subject.  Instructors lead participants through series of stretches that are designed to increase flexibility thus improving mobility.  These stretches are additional exercise for major muscle groups that are typically performed during regular warm up and cool down. Flowsheet Row CARDIAC REHAB PHASE II EXERCISE from 09/23/2016 in [place]  Date  09/23/16  Instruction Review Code  2- meets goals/outcomes      Hands Only CPR Anytime:  -Group instruction provided by verbal instruction, video, patient participation and written materials to support subject.  Instructors co-teach with AHA video for hands only CPR.  Participants get hands on experience with mannequins.   Nutrition I class: Heart Healthy Eating:  -Group instruction provided by PowerPoint slides, verbal discussion, and written materials to support subject matter. The instructor gives an explanation and review of the Therapeutic Lifestyle Changes diet recommendations, which includes a discussion on lipid goals, dietary fat, sodium, fiber, plant stanol/sterol esters, sugar, and the components of a well-balanced, healthy diet.   Nutrition II class: Lifestyle Skills:  -Group instruction provided by PowerPoint slides, verbal discussion, and written materials to support subject matter. The instructor gives an explanation and review of label reading,  grocery shopping for heart health, heart healthy recipe modifications, and ways to make healthier choices when eating out.   Diabetes Question & Answer:  -Group instruction provided by PowerPoint slides, verbal discussion, and written materials to support subject matter. The instructor gives an explanation and review of diabetes co-morbidities, pre- and post-prandial blood glucose goals, pre-exercise blood glucose goals, signs, symptoms, and treatment of hypoglycemia and hyperglycemia, and foot care basics. Flowsheet Row CARDIAC REHAB PHASE II EXERCISE from 09/23/2016 in [place]  Date  08/05/16  Educator  RD  Instruction Review Code  2- meets goals/outcomes      Diabetes Blitz:  -Group instruction provided by PowerPoint slides, verbal discussion, and written materials to support subject matter. The instructor gives an explanation and review of the physiology behind type 1 and type 2 diabetes, diabetes medications and rational behind using different medications, pre- and post-prandial blood glucose recommendations and Hemoglobin A1c goals, diabetes diet, and exercise including blood glucose guidelines for exercising safely.    Portion Distortion:  -Group instruction provided by PowerPoint slides, verbal discussion, written materials, and food models to support subject matter. The instructor gives an explanation of serving size versus portion size, changes in portions sizes over the last 20 years, and what consists of a serving from each food group. Flowsheet Row CARDIAC REHAB PHASE II EXERCISE from 09/23/2016 in [place]  Date  08/03/16  Educator  RD  Instruction Review Code  2- meets goals/outcomes      Stress Management:  -Group instruction provided by verbal instruction, video, and written materials to support subject matter.  Instructors review role of stress in heart disease and how to cope with stress positively.      Exercising on Your Own:  -Group instruction provided by verbal instruction, power point, and written materials to support subject.  Instructors discuss benefits of exercise, components of exercise, frequency and intensity of exercise, and end points for exercise.  Also discuss use of nitroglycerin and activating EMS.  Review options of places to exercise outside of rehab.  Review guidelines for sex with heart disease.   Cardiac Drugs I:  -Group instruction provided by verbal instruction and written materials to support subject.  Instructor reviews cardiac drug classes: antiplatelets, anticoagulants, beta blockers, and statins.  Instructor discusses reasons, side effects, and lifestyle considerations for each drug class. Flowsheet Row CARDIAC REHAB PHASE II EXERCISE from 09/23/2016 in [place]  Date  08/10/16  Educator  pharmD   Instruction Review Code  2- meets goals/outcomes      Cardiac Drugs II:  -Group instruction provided by verbal instruction and written materials to support subject.  Instructor reviews cardiac drug classes: angiotensin converting enzyme inhibitors (ACE-I), angiotensin II receptor blockers (ARBs), nitrates, and calcium channel blockers.  Instructor discusses reasons, side effects, and lifestyle considerations for each drug class. Flowsheet Row CARDIAC REHAB PHASE II EXERCISE from 09/23/2016 in [place]  Date  09/07/16  Educator  pharmacy  Instruction Review Code  2- meets goals/outcomes      Anatomy and Physiology of the Circulatory System:  -Group instruction provided by verbal instruction, video, and written materials to support subject.  Reviews functional anatomy of heart, how it relates to various diagnoses, and what role the heart plays in the overall system.   Knowledge Questionnaire Score:     Knowledge Questionnaire Score - 07/14/16 1342      Knowledge Questionnaire Score   Pre  Score 17/24      Core Components/Risk Factors/Patient Goals at Admission:     Personal Goals and Risk Factors at Admission - 07/14/16 1416      Core Components/Risk Factors/Patient Goals on Admission    Weight Management Weight Loss;Yes   Intervention Weight Management: Develop a combined nutrition and exercise program designed to reach desired caloric intake, while maintaining appropriate intake of nutrient and fiber, sodium and fats, and appropriate energy expenditure required for the weight goal.;Weight Management: Provide education and appropriate resources to help participant work on and attain dietary goals.;Weight Management/Obesity: Establish reasonable short term and long term weight goals.   Expected Outcomes Short Term: Continue to assess and modify interventions until short term weight is achieved;Long Term: Adherence to nutrition and physical activity/exercise program aimed toward attainment of established weight goal;Weight Maintenance: Understanding of the daily nutrition guidelines, which includes 25-35% calories from fat, 7% or less cal from saturated fats, less than 200mg  cholesterol, less than 1.5gm of sodium, & 5 or more servings of fruits and vegetables daily;Weight Loss: Understanding of general recommendations for a balanced deficit meal plan, which promotes 1-2 lb weight loss per week and includes a negative energy balance of [phone] kcal/d;Understanding recommendations for meals to include 15-35% energy as protein, 25-35% energy from fat, 35-60% energy from carbohydrates, less than 200mg  of dietary cholesterol, 20-35 gm of total fiber daily;Understanding of distribution of calorie intake throughout the day  with the consumption of 4-5 meals/snacks      Core Components/Risk Factors/Patient Goals Review:      Goals and Risk Factor Review    Row Name 08/08/16 1623 09/02/16 1638 09/23/16 1453 10/03/16 1721       Core Components/Risk Factors/Patient Goals Review   Personal  Goals Review Other Other Other Other    Review Will switch from recumbent bike to upper body arm ergometer to help patient achieve goal of increasing upper body strength. Doing well with exercise: currently walking 50 minutes (3-6 miles) 4 days per week. Pt doesn't feel upper body strength has increased thus far. Encouraged to increase hand weights from 4lbs to 5 lbs. Patient is still working on increasing upper body strength, will trying adding the rower to his exercise routine. Pt is back to using a push mower, which was one of his personal goals. Doing well with exercise. Upper body strength improved 10.71% as measured by grip strength test, functional capacity improved 26.34% as evidenced by 6 minute walk test.    Expected Outcomes Achieve increase in upper body strength and cardiorespiratory fitness through regular aerobic and resistance training program. Achieve increase in upper body strength and cardiorespiratory fitness through regular aerobic and resistance training program as measured by grip strength measurement and 6 minute walk test. Continue aerobic and resistance exercise routine to help increase upper body strength and help achieve weight loss goals. Participant will continue to walk daily 75 minutes, achieveing at least 5,000 steps/day.       Core Components/Risk Factors/Patient Goals at Discharge (Final Review):      Goals and Risk Factor Review - 10/03/16 1721      Core Components/Risk Factors/Patient Goals Review   Personal Goals Review Other   Review Doing well with exercise. Upper body strength improved 10.71% as measured by grip strength test, functional capacity improved 26.34% as evidenced by 6 minute walk test.   Expected Outcomes Participant will continue to walk daily 75 minutes, achieveing at least 5,000 steps/day.      ITP Comments:     ITP Comments    Row Name 07/14/16 0828 07/14/16 1621         ITP Comments Dr. Fransico Him immediately available as the  medical director Dr. Fransico Him, Medical Director         Comments: Rush Landmark is making expected progress toward personal goals after completing 32 sessions. Recommend continued exercise and life style modification education including  stress management and relaxation techniques to decrease cardiac risk profile. Rush Landmark will be graduating from cardiac rehab next week.Barnet Pall, RN,BSN 10/06/2016 3:40 PM

## 2016-10-05 ENCOUNTER — Encounter (HOSPITAL_COMMUNITY): Payer: Medicare Other

## 2016-10-07 ENCOUNTER — Encounter (HOSPITAL_COMMUNITY): Admission: RE | Admit: 2016-10-07 | Payer: Medicare Other | Source: Ambulatory Visit

## 2016-10-10 ENCOUNTER — Encounter: Payer: Self-pay | Admitting: Family Medicine

## 2016-10-10 ENCOUNTER — Encounter: Payer: Self-pay | Admitting: Cardiology

## 2016-10-10 ENCOUNTER — Ambulatory Visit (INDEPENDENT_AMBULATORY_CARE_PROVIDER_SITE_OTHER): Payer: Medicare Other | Admitting: Family Medicine

## 2016-10-10 ENCOUNTER — Encounter: Payer: Self-pay | Admitting: *Deleted

## 2016-10-10 ENCOUNTER — Telehealth (HOSPITAL_COMMUNITY): Payer: Self-pay | Admitting: *Deleted

## 2016-10-10 ENCOUNTER — Telehealth: Payer: Self-pay | Admitting: *Deleted

## 2016-10-10 ENCOUNTER — Encounter (HOSPITAL_COMMUNITY): Payer: Medicare Other

## 2016-10-10 VITALS — BP 108/65 | HR 81 | Temp 98.0°F | Ht 72.0 in | Wt 193.6 lb

## 2016-10-10 DIAGNOSIS — I208 Other forms of angina pectoris: Secondary | ICD-10-CM | POA: Diagnosis not present

## 2016-10-10 DIAGNOSIS — J189 Pneumonia, unspecified organism: Secondary | ICD-10-CM

## 2016-10-10 MED ORDER — BENZONATATE 100 MG PO CAPS: 100.0000 mg | ORAL_CAPSULE | Freq: Three times a day (TID) | ORAL | 0 refills | Status: DC | PRN

## 2016-10-10 MED ORDER — AZITHROMYCIN 250 MG PO TABS: ORAL_TABLET | ORAL | 0 refills | Status: DC

## 2016-10-10 NOTE — Telephone Encounter (Signed)
-----   Message from Lelon Perla, MD sent at 10/10/2016  3:33 PM EDT ----- Yes BC ----- Message ----- From: Cristopher Estimable, RN Sent: 10/10/2016   2:59 PM To: Lelon Perla, MD  Pt has bp running 92/72 to 118/70 at cardiac rehab and then 170/82 to 148/68 with exercise. The pt is c/o lightheadedness when standing and is wanting to stop his metoprolol. He is on lopressor 12.5 mg twice daily. Okay?

## 2016-10-10 NOTE — Progress Notes (Signed)
Pre visit review using our clinic review tool, if applicable. No additional management support is needed unless otherwise documented below in the visit note. 

## 2016-10-10 NOTE — Telephone Encounter (Signed)
This encounter was created in error - please disregard.

## 2016-10-10 NOTE — Progress Notes (Signed)
Chief Complaint  Patient presents with  . Pneumonia    Pt reports having a chest cold with deep coughing x1 week/ No relief     [name] here for URI complaints.  Duration: 1 week Associated symptoms: rhinorrhea Denies: sinus congestion, itchy watery eyes, ear pain, ear drainage, sore throat, wheezing, shortness of breath, chest pain and fever Treatment to date: fluids and rest Sick contacts: No  ROS:  Const: Denies fevers HEENT: As noted in HPI Lungs: No SOB  Past Medical History:  Diagnosis Date  . Aortic calcification ([place]) 02/09/2015  . Appendicitis   . Back pain 02/09/2015  . CAD (coronary artery disease)    2 stents  . Chicken pox   . Diabetes mellitus type 2 in obese ([place]) 04/11/2016  . Diabetes type 2, controlled ([place])   . Dysphagia, unspecified(787.20) 03/20/2014  . Hereditary and idiopathic peripheral neuropathy 02/09/2015  . Hyperlipidemia   . Hypertension   . Hypothyroidism   . Measles   . Medicare annual wellness visit, subsequent 06/14/2015  . Melanoma ([place])    Scalp. 2012  . Mumps as a child  . Thyroid disease    [place] Hyperthyroidism   Family History  Problem Relation Age of Onset  . Heart disease Father   . Emphysema Father   . Hypertension Father   . Cancer Father     lung/ healthy  . Heart disease Mother     Deceased  . Cancer Mother     breast  . Hypertension Mother   . Cancer Maternal Grandmother     colon  . Diabetes Brother     type 2  . Heart disease Brother   . Pulmonary embolism Son     vasculiti  . Depression Paternal Grandmother     BP 108/65 (BP Location: Left Arm, Patient Position: Sitting, Cuff Size: Small)   Pulse 81   Temp 98 F (36.7 C) (Oral)   Ht 6' (1.829 m)   Wt 193 lb 9.6 oz (87.8 kg)   SpO2 98%   BMI 26.26 kg/m  General: Awake, alert, appears stated age HEENT: AT, [place], ears patent b/l and TM's neg, nares patent w/o discharge, pharynx pink and without exudates, MMM Neck: No masses or  asymmetry Heart: RRR, no murmurs, no bruits Lungs: CTAB, no accessory muscle use Psych: Age appropriate judgment and insight, normal mood and affect  Walking pneumonia - Plan: azithromycin (ZITHROMAX) 250 MG tablet, benzonatate (TESSALON) 100 MG capsule  Orders as above. Try cough suppressant first, if no improvement, OK to take abx at end of week. F/u prn. Pt voiced understanding and agreement to the plan.  [place], DO 10/10/16 11:46 AM

## 2016-10-10 NOTE — Telephone Encounter (Signed)
Pt aware to stop metoprolol via my chart.

## 2016-10-12 ENCOUNTER — Encounter (HOSPITAL_COMMUNITY): Payer: Medicare Other

## 2016-10-14 ENCOUNTER — Encounter (HOSPITAL_COMMUNITY)
Admission: RE | Admit: 2016-10-14 | Discharge: 2016-10-14 | Disposition: A | Discharge status: Home / Self Care | Payer: Medicare Other | Source: Ambulatory Visit | Attending: Cardiology | Admitting: Cardiology

## 2016-10-14 DIAGNOSIS — Z951 Presence of aortocoronary bypass graft: Secondary | ICD-10-CM

## 2016-10-14 DIAGNOSIS — E119 Type 2 diabetes mellitus without complications: Secondary | ICD-10-CM | POA: Diagnosis not present

## 2016-10-14 NOTE — Progress Notes (Signed)
Jack Wade returned to exercise today after being out with an upper respiratory infection. Jack Wade's weight is down 1.6 kg form his last session on 10/03/2016. Jack Wade told me that Dr Stanford Breed discontinued his betablocker because he was feeling dizzy. Jack Wade tolerated exercise today without difficulty. Will continue to monitor the patient. Jack Wade should graduated next week.Barnet Pall, RN,BSN 10/14/2016 11:57 AM

## 2016-10-17 ENCOUNTER — Encounter (HOSPITAL_COMMUNITY)
Admission: RE | Admit: 2016-10-17 | Discharge: 2016-10-17 | Disposition: A | Discharge status: Home / Self Care | Payer: Medicare Other | Source: Ambulatory Visit | Attending: Cardiology | Admitting: Cardiology

## 2016-10-17 DIAGNOSIS — E119 Type 2 diabetes mellitus without complications: Secondary | ICD-10-CM | POA: Diagnosis not present

## 2016-10-17 DIAGNOSIS — M5416 Radiculopathy, lumbar region: Secondary | ICD-10-CM | POA: Diagnosis not present

## 2016-10-17 DIAGNOSIS — Z6826 Body mass index (BMI) 26.0-26.9, adult: Secondary | ICD-10-CM | POA: Diagnosis not present

## 2016-10-17 DIAGNOSIS — M9983 Other biomechanical lesions of lumbar region: Secondary | ICD-10-CM | POA: Diagnosis not present

## 2016-10-17 DIAGNOSIS — Z951 Presence of aortocoronary bypass graft: Secondary | ICD-10-CM

## 2016-10-19 ENCOUNTER — Encounter (HOSPITAL_COMMUNITY)
Admission: RE | Admit: 2016-10-19 | Discharge: 2016-10-19 | Disposition: A | Discharge status: Home / Self Care | Payer: Medicare Other | Source: Ambulatory Visit | Attending: Cardiology | Admitting: Cardiology

## 2016-10-19 DIAGNOSIS — E119 Type 2 diabetes mellitus without complications: Secondary | ICD-10-CM | POA: Diagnosis not present

## 2016-10-19 DIAGNOSIS — Z951 Presence of aortocoronary bypass graft: Secondary | ICD-10-CM

## 2016-10-20 ENCOUNTER — Ambulatory Visit (INDEPENDENT_AMBULATORY_CARE_PROVIDER_SITE_OTHER): Payer: Medicare Other | Admitting: Medical

## 2016-10-20 ENCOUNTER — Encounter: Payer: Self-pay | Admitting: Cardiology

## 2016-10-20 ENCOUNTER — Ambulatory Visit (HOSPITAL_BASED_OUTPATIENT_CLINIC_OR_DEPARTMENT_OTHER)
Admission: RE | Admit: 2016-10-20 | Discharge: 2016-10-20 | Disposition: A | Discharge status: Home / Self Care | Payer: Medicare Other | Source: Ambulatory Visit | Attending: Medical | Admitting: Medical

## 2016-10-20 ENCOUNTER — Encounter: Payer: Self-pay | Admitting: Medical

## 2016-10-20 VITALS — BP 100/70 | HR 87 | Temp 97.0°F | Ht 72.0 in | Wt 189.8 lb

## 2016-10-20 DIAGNOSIS — R062 Wheezing: Secondary | ICD-10-CM | POA: Insufficient documentation

## 2016-10-20 DIAGNOSIS — R059 Cough, unspecified: Secondary | ICD-10-CM

## 2016-10-20 DIAGNOSIS — R05 Cough: Secondary | ICD-10-CM | POA: Insufficient documentation

## 2016-10-20 DIAGNOSIS — R197 Diarrhea, unspecified: Secondary | ICD-10-CM | POA: Diagnosis not present

## 2016-10-20 DIAGNOSIS — I208 Other forms of angina pectoris: Secondary | ICD-10-CM | POA: Diagnosis not present

## 2016-10-20 DIAGNOSIS — R5383 Other fatigue: Secondary | ICD-10-CM

## 2016-10-20 DIAGNOSIS — J209 Acute bronchitis, unspecified: Secondary | ICD-10-CM

## 2016-10-20 LAB — COMPREHENSIVE METABOLIC PANEL
ALT: 20 U/L (ref 0–53)
AST: 26 U/L (ref 0–37)
Albumin: 4.6 g/dL (ref 3.5–5.2)
Alkaline Phosphatase: 65 U/L (ref 39–117)
BUN: 22 mg/dL (ref 6–23)
CO2: 25 mEq/L (ref 19–32)
Calcium: 10.4 mg/dL (ref 8.4–10.5)
Chloride: 102 mEq/L (ref 96–112)
Creatinine, Ser: 1.29 mg/dL (ref 0.40–1.50)
GFR: 57.88 mL/min — ABNORMAL LOW (ref >60.00)
Glucose, Bld: 174 mg/dL — ABNORMAL HIGH (ref 70–99)
Potassium: 4.4 mEq/L (ref 3.5–5.1)
Sodium: 136 mEq/L (ref 135–145)
Total Bilirubin: 0.6 mg/dL (ref 0.2–1.2)
Total Protein: 8.2 g/dL (ref 6.0–8.3)

## 2016-10-20 LAB — CBC WITH DIFFERENTIAL/PLATELET
Basophils Absolute: 0.1 10*3/uL (ref 0.0–0.1)
Basophils Relative: 0.9 % (ref 0.0–3.0)
Eosinophils Absolute: 0.4 10*3/uL (ref 0.0–0.7)
Eosinophils Relative: 4.5 % (ref 0.0–5.0)
HCT: 41.7 % (ref 39.0–52.0)
Hemoglobin: 14 g/dL (ref 13.0–17.0)
Lymphocytes Relative: 19.9 % (ref 12.0–46.0)
Lymphs Abs: 2 10*3/uL (ref 0.7–4.0)
MCHC: 33.6 g/dL (ref 30.0–36.0)
MCV: 77.5 fl — ABNORMAL LOW (ref 78.0–100.0)
Monocytes Absolute: 1 10*3/uL (ref 0.1–1.0)
Monocytes Relative: 9.6 % (ref 3.0–12.0)
Neutro Abs: 6.5 10*3/uL (ref 1.4–7.7)
Neutrophils Relative %: 65.1 % (ref 43.0–77.0)
Platelets: 328 10*3/uL (ref 150.0–400.0)
RBC: 5.39 Mil/uL (ref 4.22–5.81)
RDW: 17.7 % — ABNORMAL HIGH (ref 11.5–15.5)
WBC: 9.9 10*3/uL (ref 4.0–10.5)

## 2016-10-20 MED ORDER — FLUTICASONE PROPIONATE HFA 110 MCG/ACT IN AERO: 2.0000 | INHALATION_SPRAY | Freq: Two times a day (BID) | RESPIRATORY_TRACT | 3 refills | Status: DC

## 2016-10-20 MED ORDER — DOXYCYCLINE HYCLATE 100 MG PO TABS: 100.0000 mg | ORAL_TABLET | Freq: Two times a day (BID) | ORAL | 0 refills | Status: DC

## 2016-10-20 MED ORDER — PREDNISONE 10 MG PO TABS: ORAL_TABLET | ORAL | 0 refills | Status: DC

## 2016-10-20 MED ORDER — HYDROCODONE-HOMATROPINE 5-1.5 MG/5ML PO SYRP: 5.0000 mL | ORAL_SOLUTION | Freq: Three times a day (TID) | ORAL | 0 refills | Status: DC | PRN

## 2016-10-20 MED ORDER — ALBUTEROL SULFATE HFA 108 (90 BASE) MCG/ACT IN AERS: 2.0000 | INHALATION_SPRAY | Freq: Four times a day (QID) | RESPIRATORY_TRACT | 0 refills | Status: DC | PRN

## 2016-10-20 NOTE — Progress Notes (Signed)
Pre visit review using our clinic review tool, if applicable. No additional management support is needed unless otherwise documented below in the visit note. 

## 2016-10-20 NOTE — Telephone Encounter (Signed)
rx doxycycline sent to his pharmacy.

## 2016-10-20 NOTE — Patient Instructions (Addendum)
For your recent wheezing will rx flovent inhaler to use at least 2 weeks and albuterol inhaler as well. If you have severe wheezing despite use of 2 inhalers then add short tapered dose prednisone. While on taper reduce your sugar intake and check sugars. If sugar levels exceeding 200 let us know.  By lung exam you may have bronchitis and maybe pneumonia. Will get xray of your lungs then decide of antibiotic after cxr review.  Hycodan for cough  Get cbc and cmp today.  For your loose stools will order stool panel kit. Sometimes with antibiotic can get antibiotic induced diarrhea. If more than 3 loose stools a day then go ahead and turn in stool panel kit. This would be important to know results as you will be on second round of antibiotics. Also go ahead and get probiotics and use while on antibiotic.  Follow up 7 days or as needed

## 2016-10-20 NOTE — Progress Notes (Signed)
 Subjective:    Patient ID: Jack Wade, male    DOB: 03/29/1941, 74 y.o.   MRN: [id number]  HPI  Pt in stating on 16 th he had a lot of coughing and chest congestion. Pt stated at that time had more chest congestion but some nasal congestion. Pt saw Dr. Wendling and gave zpack  and benzonatate . Per pt has not improved.  He states still has nasal congestion that is minimal. However he states still  has cough that is productive, he feels  fatigued and wheezing at night past 3 nights.   No fever, no chills or sweats.  Pt smoked about 75 yr old to about 75 yrs old. By teenager he was smoking pack and half a day. But stopped about 40 years ago.   Pt had diarrhea for about one week. He states it is watery stools. 1-2 times a day.   Pt started on ensure about one week ago.   Pt sugars recently 100-135. He is diabetic.    Review of Systems  Constitutional: Positive for fatigue. Negative for chills and fever.  HENT: Positive for congestion. Negative for hearing loss.   Respiratory: Positive for wheezing. Negative for cough, chest tightness and shortness of breath.        Productive  Cardiovascular: Negative for chest pain and palpitations.  Gastrointestinal: Negative for abdominal pain.  Genitourinary: Negative for dysuria and flank pain.  Neurological: Negative for dizziness, weakness, numbness and headaches.  Hematological: Negative for adenopathy. Does not bruise/bleed easily.  Psychiatric/Behavioral: Negative for behavioral problems and confusion.    Past Medical History:  Diagnosis Date  . Aortic calcification ([facility]) 02/09/2015  . Appendicitis   . Back pain 02/09/2015  . CAD (coronary artery disease)    2 stents  . Chicken pox   . Diabetes mellitus type 2 in obese ([facility]) 04/11/2016  . Diabetes type 2, controlled ([facility])   . Dysphagia, unspecified(787.20) 03/20/2014  . Hereditary and idiopathic peripheral neuropathy 02/09/2015  . Hyperlipidemia   . Hypertension   .  Hypothyroidism   . Measles   . Medicare annual wellness visit, subsequent 06/14/2015  . Melanoma ([facility])    Scalp. 2012  . Mumps as a child  . Thyroid disease    [place] Hyperthyroidism     Social History   Social History  . Marital status: Married    Spouse name: N/A  . Number of children: 2  . Years of education: N/A   Occupational History  . Not on file.   Social History Main Topics  . Smoking status: Former Smoker    Packs/day: 2.00    Years: 30.00    Types: Cigarettes    Start date: 12/27/1983  . Smokeless tobacco: Never Used  . Alcohol use 0.0 oz/week     Comment: occasionally  . Drug use: No  . 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    Other Topics Concern  . Not on file   Social History Narrative  . No narrative on file    Past Surgical History:  Procedure Laterality Date  . APPENDECTOMY    . BACK SURGERY     x2 lumbar  . BACK SURGERY     late 1990s had 2 surgeries, first surgery lifted a heavy engine ruptured disds, at L4 and L5, cleaned discs no hardware very helpful. 2 years later while recovering from angiogram with weights applied to left leg caused a recurrence   and required surgery again in same area with disc repair  . CARDIAC CATHETERIZATION    . CARDIAC CATHETERIZATION N/A 05/11/2016   Procedure: Left Heart Cath and Coronary Angiography;  Surgeon: Jack Crome, MD;  Location: [facility];  Service: Cardiovascular;  Laterality: N/A;  . CARDIAC CATHETERIZATION N/A 05/11/2016   Procedure: Intravascular Pressure Wire/FFR Study;  Surgeon: Jack Crome, MD;  Location: [facility];  Service: Cardiovascular;  Laterality: N/A;  . CORONARY ANGIOPLASTY WITH STENT PLACEMENT     2 stents  . CORONARY ARTERY BYPASS GRAFT N/A 05/17/2016   Procedure: CORONARY ARTERY BYPASS GRAFTING (CABG) x 6 using left mammory artery and right greater saphenous vein harvested endoscopically. Jack Wade to LAD, SVG to  Diagonal, SVG Sequential to OM2 and ramus intermediate, SVG Sequential to RCA and PDA;  Surgeon: Jack Isaac, MD;  Location: [facility];  Service: Open Heart Surgery;  Laterality: N/A;  . KNEE ARTHROSCOPY     right knee  . LUMBAR LAMINECTOMY/DECOMPRESSION MICRODISCECTOMY Bilateral 04/10/2015   Procedure: LUMBAR LAMINECTOMY/DECOMPRESSION MICRODISCECTOMY 2 LEVELS;  Surgeon: Jack Pall, MD;  Location: [facility];  Service: Neurosurgery;  Laterality: Bilateral;  Bilateral L45 L5S1 Laminectomy and Foraminotomy  . ROTATOR CUFF REPAIR Right   . SKIN CANCER EXCISION     melanoma on scalp  . TEE WITHOUT CARDIOVERSION N/A 05/17/2016   Procedure: TRANSESOPHAGEAL ECHOCARDIOGRAM (TEE);  Surgeon: Jack Isaac, MD;  Location: [facility];  Service: Open Heart Surgery;  Laterality: N/A;  . TONSILLECTOMY AND ADENOIDECTOMY    . WISDOM TOOTH EXTRACTION      Family History  Problem Relation Age of Onset  . Heart disease Father   . Emphysema Father   . Hypertension Father   . Cancer Father     lung/ healthy  . Heart disease Mother     Deceased  . Cancer Mother     breast  . Hypertension Mother   . Cancer Maternal Grandmother     colon  . Diabetes Brother     type 2  . Heart disease Brother   . Pulmonary embolism Son     vasculiti  . Depression Paternal Grandmother     No Known Allergies  Current Outpatient Prescriptions on File Prior to Visit  Medication Sig Dispense Refill  . aspirin 81 MG tablet Take 81 mg by mouth daily.    Marland Kitchen glipiZIDE (GLUCOTROL) 5 MG tablet 1 tab po in am and 1/2 tab po in pm 360 tablet 2  . glucose blood (ONETOUCH VERIO) test strip Use as directed three times daily to check blood sugar.  DX E11.8 300 each 3  . levothyroxine (SYNTHROID, LEVOTHROID) 100 MCG tablet TAKE 1 TABLET (100 MCG TOTAL) BY MOUTH DAILY BEFORE BREAKFAST. 90 tablet 1  . metFORMIN (GLUCOPHAGE-XR) 500 MG 24 hr tablet Take 2 tablets (1,000 mg total) by mouth 2 (two) times daily. 360 tablet 2  . ONE  TOUCH LANCETS MISC Use as directed three times daily to check blood sugar.  DX E11.8 300 each 3  . rosuvastatin (CRESTOR) 40 MG tablet Take 1 tablet (40 mg total) by mouth daily. 90 tablet 3  . tamsulosin (FLOMAX) 0.4 MG CAPS capsule Take 1 capsule (0.4 mg total) by mouth daily. 5 capsule 0   No current facility-administered medications on file prior to visit.     BP 100/70 (BP Location: Left Arm, Patient Position: Sitting)   Pulse 87   Temp 97 F (36.1 C) (Oral)  Ht 6' (1.829 m)   Wt 189 lb 12.8 oz (86.1 kg)   SpO2 98%   BMI 25.74 kg/m        Objective:   Physical Exam  General  Mental Status - Alert. General Appearance - Well groomed. Not in acute distress.  Skin Rashes- No Rashes.  HEENT Head- Normal. Ear Auditory Canal - Left- Normal. Right - Normal.Tympanic Membrane- Left- Normal. Right- Normal. Eye Sclera/Conjunctiva- Left- Normal. Right- Normal. Nose & Sinuses Nasal Mucosa- Left-  Boggy and Congested. Right-  Boggy and  Congested.Bilateral  No maxillary and no  frontal sinus pressure. Mouth & Throat Lips: Upper Lip- Normal: no dryness, cracking, pallor, cyanosis, or vesicular eruption. Lower Lip-Normal: no dryness, cracking, pallor, cyanosis or vesicular eruption. Buccal Mucosa- Bilateral- No Aphthous ulcers. Oropharynx- No Discharge or Erythema. Tonsils: Characteristics- Bilateral- No Erythema or Congestion. Size/Enlargement- Bilateral- No enlargement. Discharge- bilateral-None.  Neck Neck- Supple. No Masses.   Chest and Lung Exam Auscultation: Breath Sounds:- even and unlabored. But diffuse rough breath sounds.More so at the base(left side sounds worse)  Cardiovascular Auscultation:Rythm- Regular, rate and rhythm. Murmurs & Other Heart Sounds:Ausculatation of the heart reveal- No Murmurs.  Lymphatic Head & Neck General Head & Neck Lymphatics: Bilateral: Description- No Localized lymphadenopathy.   Abdomen- soft, nt, nd, +bs       Assessment &  Plan:  For your recent wheezing will rx flovent inhaler to use at least 2 weeks and albuterol inhaler as well. If you have severe wheezing despite use of 2 inhalers then add short tapered dose prednisone. While on taper reduce your sugar intake and check sugars. If sugar levels exceeding 200 let us know.  By lung exam you may have bronchitis and maybe pneumonia. Will get xray of your lungs then decide of antibiotic after cxr review.  Hycodan for cough  Get cbc and cmp today.  For your loose stools will order stool panel kit. Sometimes with antibiotic can get antibiotic induced diarrhea. If more than 3 loose stools a day then go ahead and turn in stool panel kit. This would be important to know results as you will be on second round of antibiotics. Also go ahead and get probiotics and use while on antibiotic.  Follow up 7 days or as needed  Meloney Feld, Percell Miller, [employer]

## 2016-10-20 NOTE — Progress Notes (Signed)
Zadrian's heart rate resting has been noted in the 90's sinus with a maximum heart rate of 136. Will fax exercise flow sheets to Dr. Jacalyn Lefevre office for review with ECG tracings. Dr Stanford Breed discontinued Mr [place] metoprolol due to complaints of fatigue. Entry blood pressure 104/72 heart rate 94. Blood pressure noted at 162/78 on the rower.with a heart rate of 136. Exit heart rate 108/68. Barnet Pall, RN,BSN 10/20/2016 11:12 AM

## 2016-10-21 ENCOUNTER — Encounter (HOSPITAL_COMMUNITY)
Admission: RE | Admit: 2016-10-21 | Discharge: 2016-10-21 | Disposition: A | Discharge status: Home / Self Care | Payer: Medicare Other | Source: Ambulatory Visit | Attending: Cardiology | Admitting: Cardiology

## 2016-10-21 VITALS — BP 112/76 | HR 88 | Ht 71.5 in | Wt 190.7 lb

## 2016-10-21 DIAGNOSIS — E119 Type 2 diabetes mellitus without complications: Secondary | ICD-10-CM | POA: Diagnosis not present

## 2016-10-21 DIAGNOSIS — Z951 Presence of aortocoronary bypass graft: Secondary | ICD-10-CM

## 2016-10-21 NOTE — Progress Notes (Signed)
Discharge Summary  Patient Details  Name: Jack Wade MRN: [id number] Date of Birth: 1941-03-23 Referring Provider:   Flowsheet Row CARDIAC REHAB PHASE II ORIENTATION from 07/14/2016 in [facility]  Referring Provider  Kirk Ruths MD       Number of Visits: 36  Reason for Discharge:  Patient independent in their exercise.  Smoking History:  History  Smoking Status  . Former Smoker  . Packs/day: 2.00  . Years: 30.00  . Types: Cigarettes  . Start date: 12/27/1983  Smokeless Tobacco  . Never Used    Diagnosis:  05/17/16 S/P CABG x 6  ADL UCSD:   Initial Exercise Prescription:     Initial Exercise Prescription - 07/14/16 1300      Date of Initial Exercise RX and Referring Provider   Date 07/14/16   Referring Provider Kirk Ruths MD     Recumbant Bike   Level 2   Minutes 10   METs 2     NuStep   Level 3   Minutes 10   METs 2     Track   Laps 8   Minutes 10   METs 2.74     Prescription Details   Frequency (times per week) 3   Duration Progress to 30 minutes of continuous aerobic without signs/symptoms of physical distress     Intensity   THRR 40-80% of Max Heartrate 59-118   Ratings of Perceived Exertion 11-13   Perceived Dyspnea 0-4     Progression   Progression Continue to progress workloads to maintain intensity without signs/symptoms of physical distress.     Resistance Training   Training Prescription Yes   Weight 2lb   Reps 10-12      Discharge Exercise Prescription (Final Exercise Prescription Changes):     Exercise Prescription Changes - 10/24/16 1100      Exercise Review   Progression Yes     Response to Exercise   Blood Pressure (Admit) 112/76   Blood Pressure (Exercise) 164/82   Blood Pressure (Exit) 118/62   Heart Rate (Admit) 88 bpm   Heart Rate (Exercise) 134 bpm   Heart Rate (Exit) 102 bpm   Rating of Perceived Exertion (Exercise) 11   Symptoms none   Comments Reviewed  home exercise guidelines on 07/27/16.   Duration Progress to 30 minutes of continuous aerobic without signs/symptoms of physical distress   Intensity THRR unchanged     Progression   Progression Continue to progress workloads to maintain intensity without signs/symptoms of physical distress.   Average METs 4.4     Resistance Training   Training Prescription Yes   Weight 5lbs   Reps 10-12     Interval Training   Interval Training No     NuStep   Level --   Minutes --   METs --     Arm Ergometer   Level 4   Watts --   Minutes 10   METs 3.5     Rower   Level 3   Watts 28   Minutes 10   METs 4.66     Track   Laps 16   Minutes 10   METs 3.79     Home Exercise Plan   Plans to continue exercise at Home   Frequency Add 4 additional days to program exercise sessions.      Functional Capacity:     6 Minute Walk    Row Name 07/14/16 1343 10/03/16 1723  6 Minute Walk   Phase Initial Discharge    Distance 1230 feet 1554 feet    Distance % Change  - 26.34 %    Walk Time 6 minutes 6 minutes    # of Rest Breaks 0 0    MPH 2.3 2.94    METS 2.8 3.64    RPE 11 10    VO2 Peak 9.8 12.74    Symptoms No No    Resting HR 96 bpm 99 bpm    Resting BP 102/62 98/64    Max Ex. HR 133 bpm 137 bpm    Max Ex. BP 104/70 130/62    2 Minute Post BP 228/74  -       Psychological, QOL, Others - Outcomes: PHQ 2/9: Depression screen [facility] 2/9 11/07/2016 07/18/2016 07/14/2015  Decreased Interest 0 0 0  Down, Depressed, Hopeless 0 0 0  PHQ - 2 Score 0 0 0    Quality of Life:     Quality of Life - 10/17/16 1416      Quality of Life Scores   Health/Function Pre 28.04 %   Health/Function Post 28.13 %   Health/Function % Change 0.32 %   Socioeconomic Pre 27.17 %   Socioeconomic Post 27.58 %   Socioeconomic % Change  1.51 %   Psych/Spiritual Pre 26.14 %   Psych/Spiritual Post 27 %   Psych/Spiritual % Change 3.29 %   Family Pre 30 %   Family Post 27.8 %   Family %  Change -7.33 %   GLOBAL Pre 27.76 %   GLOBAL Post 27.74 %   GLOBAL % Change -0.07 %      Personal Goals: Goals established at orientation with interventions provided to work toward goal.     Personal Goals and Risk Factors at Admission - 07/14/16 1416      Core Components/Risk Factors/Patient Goals on Admission    Weight Management Weight Loss;Yes   Intervention Weight Management: Develop a combined nutrition and exercise program designed to reach desired caloric intake, while maintaining appropriate intake of nutrient and fiber, sodium and fats, and appropriate energy expenditure required for the weight goal.;Weight Management: Provide education and appropriate resources to help participant work on and attain dietary goals.;Weight Management/Obesity: Establish reasonable short term and long term weight goals.   Expected Outcomes Short Term: Continue to assess and modify interventions until short term weight is achieved;Long Term: Adherence to nutrition and physical activity/exercise program aimed toward attainment of established weight goal;Weight Maintenance: Understanding of the daily nutrition guidelines, which includes 25-35% calories from fat, 7% or less cal from saturated fats, less than 233m cholesterol, less than 1.5gm of sodium, & 5 or more servings of fruits and vegetables daily;Weight Loss: Understanding of general recommendations for a balanced deficit meal plan, which promotes 1-2 lb weight loss per week and includes a negative energy balance of [id number] kcal/d;Understanding recommendations for meals to include 15-35% energy as protein, 25-35% energy from fat, 35-60% energy from carbohydrates, less than 202mof dietary cholesterol, 20-35 gm of total fiber daily;Understanding of distribution of calorie intake throughout the day with the consumption of 4-5 meals/snacks       Personal Goals Discharge:     Goals and Risk Factor Review    Row Name 08/08/16 1623 09/02/16 1638  09/23/16 1453 10/03/16 1721 10/21/16 1100     Core Components/Risk Factors/Patient Goals Review   Personal Goals Review Other Other Other Other Other;Weight Management/Obesity;Increase Strength and Stamina   Review Will switch  from recumbent bike to upper body arm ergometer to help patient achieve goal of increasing upper body strength. Doing well with exercise: currently walking 50 minutes (3-6 miles) 4 days per week. Pt doesn't feel upper body strength has increased thus far. Encouraged to increase hand weights from 4lbs to 5 lbs. Patient is still working on increasing upper body strength, will trying adding the rower to his exercise routine. Pt is back to using a push mower, which was one of his personal goals. Doing well with exercise. Upper body strength improved 10.71% as measured by grip strength test, functional capacity improved 26.34% as evidenced by 6 minute walk test. Overall participant improved strength and stamina and fucntional capacity. Weight was down 2.4 lbs since orientation.   Expected Outcomes Achieve increase in upper body strength and cardiorespiratory fitness through regular aerobic and resistance training program. Achieve increase in upper body strength and cardiorespiratory fitness through regular aerobic and resistance training program as measured by grip strength measurement and 6 minute walk test. Continue aerobic and resistance exercise routine to help increase upper body strength and help achieve weight loss goals. Participant will continue to walk daily 75 minutes, achieveing at least 5,000 steps/day. Participant will continue to walk daily 75 minutes, achieveing at least 5,000 steps/day.      Nutrition & Weight - Outcomes:     Pre Biometrics - 07/14/16 1346      Pre Biometrics   Waist Circumference 38.75 inches   Hip Circumference 41 inches   Waist to Hip Ratio 0.95 %   Triceps Skinfold 16 mm   % Body Fat 26.6 %   Grip Strength 42 kg   Flexibility 0 in   Single  Leg Stand 24.94 seconds         Post Biometrics - 10/21/16 0945       Post  Biometrics   Height 5' 11.5" (1.816 m)   Weight 190 lb 11.2 oz (86.5 kg)   Waist Circumference 37.5 inches   Hip Circumference 40.25 inches   Waist to Hip Ratio 0.93 %   BMI (Calculated) 26.3   Triceps Skinfold 20 mm   % Body Fat 26.9 %   Grip Strength 46.5 kg   Flexibility 0 in   Single Leg Stand 4.31 seconds      Nutrition:     Nutrition Therapy & Goals - 07/21/16 1443      Nutrition Therapy   Diet Carb Modified, Therapeutic Lifestyle Change     Personal Nutrition Goals   Personal Goal #1 Maintain wt while in Cardiac Rehab   Personal Goal #2 Improved glycemic control as evidenced by an improved A1c to a goal of 7.0 or less     Intervention Plan   Intervention Prescribe, educate and counsel regarding individualized specific dietary modifications aiming towards targeted core components such as weight, hypertension, lipid management, diabetes, heart failure and other comorbidities.   Expected Outcomes Short Term Goal: Understand basic principles of dietary content, such as calories, fat, sodium, cholesterol and nutrients.;Long Term Goal: Adherence to prescribed nutrition plan.      Nutrition Discharge:     Nutrition Assessments - 10/17/16 1454      MEDFICTS Scores   Pre Score 36   Post Score --  survey returned incomplete      Education Questionnaire Score:     Knowledge Questionnaire Score - 10/17/16 1416      Knowledge Questionnaire Score   Pre Score 17/24   Post Score 17/24  Goals reviewed with patient; copy given to patient.Pt graduated from cardiac rehab program today with completion of 36 exercise sessions in Phase II. Pt maintained good attendance and progressed nicely during his participation in rehab as evidenced by increased MET level.   Medication list reconciled. Repeat  PHQ score- 0 .  Pt has made significant lifestyle changes and should be commended for his  success. Pt feels he has achieved his goals during cardiac rehab.   Pt plans to continue exercise in cardiac maintenance program.Marianela Mandrell Miguel Rota RN BSN

## 2016-10-24 ENCOUNTER — Encounter (HOSPITAL_COMMUNITY): Admission: RE | Admit: 2016-10-24 | Payer: Medicare Other | Source: Ambulatory Visit

## 2016-10-26 ENCOUNTER — Encounter (HOSPITAL_COMMUNITY): Payer: Medicare Other

## 2016-10-26 DIAGNOSIS — Z6826 Body mass index (BMI) 26.0-26.9, adult: Secondary | ICD-10-CM | POA: Diagnosis not present

## 2016-10-26 DIAGNOSIS — M9983 Other biomechanical lesions of lumbar region: Secondary | ICD-10-CM | POA: Diagnosis not present

## 2016-10-26 DIAGNOSIS — M5416 Radiculopathy, lumbar region: Secondary | ICD-10-CM | POA: Diagnosis not present

## 2016-10-28 ENCOUNTER — Encounter (HOSPITAL_COMMUNITY): Payer: Medicare Other

## 2016-10-30 ENCOUNTER — Encounter: Payer: Self-pay | Admitting: Cardiology

## 2016-10-31 ENCOUNTER — Encounter (HOSPITAL_COMMUNITY): Payer: Medicare Other

## 2016-11-02 ENCOUNTER — Encounter (HOSPITAL_COMMUNITY): Payer: Medicare Other

## 2016-11-04 ENCOUNTER — Encounter (HOSPITAL_COMMUNITY): Payer: Medicare Other

## 2016-11-07 ENCOUNTER — Encounter (HOSPITAL_COMMUNITY): Payer: Medicare Other

## 2016-11-07 NOTE — Addendum Note (Signed)
Encounter addended by: Magda Kiel, RN on: 11/07/2016  2:07 PM<BR>    Actions taken: Visit Navigator Flowsheet section accepted

## 2016-11-09 ENCOUNTER — Encounter (HOSPITAL_COMMUNITY): Payer: Medicare Other

## 2016-11-10 ENCOUNTER — Encounter (HOSPITAL_BASED_OUTPATIENT_CLINIC_OR_DEPARTMENT_OTHER): Payer: Self-pay | Admitting: Emergency Medicine

## 2016-11-10 ENCOUNTER — Emergency Department (HOSPITAL_BASED_OUTPATIENT_CLINIC_OR_DEPARTMENT_OTHER)
Admission: EM | Admit: 2016-11-10 | Discharge: 2016-11-10 | Disposition: A | Discharge status: Home / Self Care | Payer: Medicare Other | Attending: Emergency Medicine | Admitting: Emergency Medicine

## 2016-11-10 ENCOUNTER — Encounter: Payer: Self-pay | Admitting: Family Medicine

## 2016-11-10 ENCOUNTER — Emergency Department (HOSPITAL_BASED_OUTPATIENT_CLINIC_OR_DEPARTMENT_OTHER): Payer: Medicare Other

## 2016-11-10 DIAGNOSIS — E1122 Type 2 diabetes mellitus with diabetic chronic kidney disease: Secondary | ICD-10-CM | POA: Diagnosis not present

## 2016-11-10 DIAGNOSIS — Z7982 Long term (current) use of aspirin: Secondary | ICD-10-CM | POA: Diagnosis not present

## 2016-11-10 DIAGNOSIS — Z87891 Personal history of nicotine dependence: Secondary | ICD-10-CM | POA: Insufficient documentation

## 2016-11-10 DIAGNOSIS — N183 Chronic kidney disease, stage 3 (moderate): Secondary | ICD-10-CM | POA: Diagnosis not present

## 2016-11-10 DIAGNOSIS — I2510 Atherosclerotic heart disease of native coronary artery without angina pectoris: Secondary | ICD-10-CM | POA: Insufficient documentation

## 2016-11-10 DIAGNOSIS — M79672 Pain in left foot: Secondary | ICD-10-CM

## 2016-11-10 DIAGNOSIS — M19072 Primary osteoarthritis, left ankle and foot: Secondary | ICD-10-CM | POA: Diagnosis not present

## 2016-11-10 DIAGNOSIS — E039 Hypothyroidism, unspecified: Secondary | ICD-10-CM | POA: Insufficient documentation

## 2016-11-10 DIAGNOSIS — Z85820 Personal history of malignant melanoma of skin: Secondary | ICD-10-CM | POA: Diagnosis not present

## 2016-11-10 DIAGNOSIS — I129 Hypertensive chronic kidney disease with stage 1 through stage 4 chronic kidney disease, or unspecified chronic kidney disease: Secondary | ICD-10-CM | POA: Insufficient documentation

## 2016-11-10 DIAGNOSIS — Z7984 Long term (current) use of oral hypoglycemic drugs: Secondary | ICD-10-CM | POA: Diagnosis not present

## 2016-11-10 NOTE — ED Provider Notes (Signed)
[facility] DEPT [facility] Provider Note   CSN: [id number] Arrival date & time: 11/10/16  [id number]     History   Chief Complaint Chief Complaint  Patient presents with  . Foot Pain    HPI Jack Wade is a 75 y.o. male.  HPI  75 year old male who presents with left foot pain. History of well controlled DM, CAD s/p stent, HTN and HLD. Noticed pain over the dorsum of the left foot this morning when he flexed his foot downward. States that his wife suggested he come to ED for evaluation. Denies any trauma or fall. States he has been on his feet more recently doing work and recently [employer] the lawn but not anything too strenuous. No numbness or weakness of foot. No fevers or chills.  Past Medical History:  Diagnosis Date  . Aortic calcification ([place]) 02/09/2015  . Appendicitis   . Back pain 02/09/2015  . CAD (coronary artery disease)    2 stents  . Chicken pox   . Diabetes mellitus type 2 in obese ([place]) 04/11/2016  . Diabetes type 2, controlled ([place])   . Dysphagia, unspecified(787.20) 03/20/2014  . Hereditary and idiopathic peripheral neuropathy 02/09/2015  . Hyperlipidemia   . Hypertension   . Hypothyroidism   . Measles   . Medicare annual wellness visit, subsequent 06/14/2015  . Melanoma ([place])    Scalp. 2012  . Mumps as a child  . Thyroid disease    [place] Hyperthyroidism    Patient Active Problem List   Diagnosis Date Noted  . Fatigue 06/06/2016  . Chronic renal disease, stage III 06/06/2016  . Strain of left biceps 05/11/2016  . Angina decubitus ([place])   . Type 2 diabetes with complication ([place]) [id number]  . Left arm pain 07/14/2015  . Medicare annual wellness visit, subsequent 06/14/2015  . HNP (herniated nucleus pulposus), lumbar 04/10/2015  . Peripheral vascular disease ([place]) 03/18/2015  . Hereditary and idiopathic peripheral neuropathy 02/09/2015  . Back pain 02/09/2015  . Aortic calcification ([place]) 02/09/2015  . Vertigo 04/04/2014  . Hx of CABG 05/17/16  03/23/2014  . HTN (hypertension) 03/23/2014  . H/O tobacco use, presenting hazards to health 03/23/2014  . Preventative health care 03/23/2014  . Pain in joint, ankle and foot 03/23/2014  . Dysphagia, unspecified(787.20) 03/20/2014  . Melanoma ([place])   . Hypothyroidism   . Hyperlipidemia 12/22/2013    Past Surgical History:  Procedure Laterality Date  . APPENDECTOMY    . BACK SURGERY     x2 lumbar  . BACK SURGERY     late 1990s had 2 surgeries, first surgery lifted a heavy engine ruptured disds, at L4 and L5, cleaned discs no hardware very helpful. 2 years later while recovering from angiogram with weights applied to left leg caused a recurrence and required surgery again in same area with disc repair  . CARDIAC CATHETERIZATION    . CARDIAC CATHETERIZATION N/A 05/11/2016   Procedure: Left Heart Cath and Coronary Angiography;  Surgeon: Belva Crome, MD;  Location: [facility];  Service: Cardiovascular;  Laterality: N/A;  . CARDIAC CATHETERIZATION N/A 05/11/2016   Procedure: Intravascular Pressure Wire/FFR Study;  Surgeon: Belva Crome, MD;  Location: [facility];  Service: Cardiovascular;  Laterality: N/A;  . CORONARY ANGIOPLASTY WITH STENT PLACEMENT     2 stents  . CORONARY ARTERY BYPASS GRAFT N/A 05/17/2016   Procedure: CORONARY ARTERY BYPASS GRAFTING (CABG) x 6 using left mammory artery and right greater saphenous vein harvested endoscopically.  [place] to LAD, SVG to Diagonal, SVG Sequential to OM2 and ramus intermediate, SVG Sequential to RCA and PDA;  Surgeon: [name], MD;  Location: [facility];  Service: Open Heart Surgery;  Laterality: N/A;  . KNEE ARTHROSCOPY     right knee  . LUMBAR LAMINECTOMY/DECOMPRESSION MICRODISCECTOMY Bilateral 04/10/2015   Procedure: LUMBAR LAMINECTOMY/DECOMPRESSION MICRODISCECTOMY 2 LEVELS;  Surgeon: Ashok Pall, MD;  Location: [facility];  Service: Neurosurgery;  Laterality: Bilateral;  Bilateral L45 L5S1 Laminectomy and Foraminotomy  .  ROTATOR CUFF REPAIR Right   . SKIN CANCER EXCISION     melanoma on scalp  . TEE WITHOUT CARDIOVERSION N/A 05/17/2016   Procedure: TRANSESOPHAGEAL ECHOCARDIOGRAM (TEE);  Surgeon: [name], MD;  Location: [facility];  Service: Open Heart Surgery;  Laterality: N/A;  . TONSILLECTOMY AND ADENOIDECTOMY    . WISDOM TOOTH EXTRACTION         Home Medications    Prior to Admission medications   Medication Sig Start Date End Date Taking? Authorizing Provider  albuterol (PROVENTIL HFA;VENTOLIN HFA) 108 (90 Base) MCG/ACT inhaler Inhale 2 puffs into the lungs every 6 (six) hours as needed for wheezing or shortness of breath. 10/20/16   Percell Miller Saguier, PA-C  aspirin 81 MG tablet Take 81 mg by mouth daily.    Historical Provider, MD  doxycycline (VIBRA-TABS) 100 MG tablet Take 1 tablet (100 mg total) by mouth 2 (two) times daily. Can give caps or generic 10/20/16   Mackie Pai, PA-C  fluticasone (FLOVENT HFA) 110 MCG/ACT inhaler Inhale 2 puffs into the lungs 2 (two) times daily. 10/20/16   Percell Miller Saguier, PA-C  glipiZIDE (GLUCOTROL) 5 MG tablet 1 tab po in am and 1/2 tab po in pm 09/16/16   Mosie Lukes, MD  glucose blood (ONETOUCH VERIO) test strip Use as directed three times daily to check blood sugar.  DX E11.8 08/30/16   Mosie Lukes, MD  HYDROcodone-homatropine [facility]) 5-1.5 MG/5ML syrup Take 5 mLs by mouth every 8 (eight) hours as needed for cough. 10/20/16   Percell Miller Saguier, PA-C  levothyroxine (SYNTHROID, LEVOTHROID) 100 MCG tablet TAKE 1 TABLET (100 MCG TOTAL) BY MOUTH DAILY BEFORE BREAKFAST. 03/03/15   Mosie Lukes, MD  metFORMIN (GLUCOPHAGE-XR) 500 MG 24 hr tablet Take 2 tablets (1,000 mg total) by mouth 2 (two) times daily. 05/25/16   Donielle Liston Alba, PA-C  ONE TOUCH LANCETS MISC Use as directed three times daily to check blood sugar.  DX E11.8 08/30/16   Mosie Lukes, MD  predniSONE (DELTASONE) 10 MG tablet 4 tab po day 1, 3 tab po day 2, 2 tab po day 3, and 1 tab po day 4.  10/20/16   Edward Saguier, PA-C  rosuvastatin (CRESTOR) 40 MG tablet Take 1 tablet (40 mg total) by mouth daily. 05/09/16   Lelon Perla, MD    Family History Family History  Problem Relation Age of Onset  . Heart disease Father   . Emphysema Father   . Hypertension Father   . Cancer Father     lung/ healthy  . Heart disease Mother     Deceased  . Cancer Mother     breast  . Hypertension Mother   . Cancer Maternal Grandmother     colon  . Diabetes Brother     type 2  . Heart disease Brother   . Pulmonary embolism Son     vasculiti  . Depression Paternal Grandmother     Social History Social History  Substance Use Topics  . Smoking status: Former Smoker    Packs/day: 2.00    Years: 30.00    Types: Cigarettes    Start date: 12/27/1983  . Smokeless tobacco: Never Used  . Alcohol use 0.0 oz/week     Comment: occasionally     Allergies   Patient has no known allergies.   Review of Systems Review of Systems  Constitutional: Negative for fever.  Skin: Negative for wound.  Neurological: Negative for weakness and numbness.  Hematological: Does not bruise/bleed easily.  All other systems reviewed and are negative.    Physical Exam Updated Vital Signs BP 115/75 (BP Location: Left Arm)   Pulse 71   Temp 97.5 F (36.4 C) (Oral)   Resp 18   Ht 6' (1.829 m)   Wt 185 lb (83.9 kg)   SpO2 100%   BMI 25.09 kg/m   Physical Exam Physical Exam  Nursing note and vitals reviewed. Constitutional: Well developed, well nourished, non-toxic, and in no acute distress Head: Normocephalic and atraumatic.  Mouth/Throat: Oropharynx is clear and moist.  Neck: Normal range of motion. Neck supple.  Cardiovascular: +2 DP pulses bilaterally Pulmonary/Chest: Effort normal  Abdominal: Soft. There is no tenderness. There is no rebound and no guarding.  Musculoskeletal: Normal range of motion of left ankle and foot. No tenderness to palpation. no deformities. Mild soft tissue  swelling at the toes. Missing toenail of 3rd toe (which pt reports taking off this morning) Neurological: Alert, no facial droop, fluent speech, moves all extremities symmetrically, intact sensation to light touch in lower extremities Skin: Skin is warm and dry.  Psychiatric: Cooperative   ED Treatments / Results  Labs (all labs ordered are listed, but only abnormal results are displayed) Labs Reviewed - No data to display  EKG  EKG Interpretation None       Radiology Dg Foot Complete Left  Result Date: 11/10/2016 CLINICAL DATA:  Pain to left foot the starts on dorsal aspect of Left foot and then wraps around entire left foot. No known injury. EXAM: LEFT FOOT - COMPLETE 3+ VIEW COMPARISON:  None. FINDINGS: Degenerative narrowing of the interphalangeal joints. Erosion or subcortical cyst along the lateral portion of the head of the proximal phalanx of the great toe. Lisfranc joint alignment normal.  Vascular calcifications noted. IMPRESSION: 1. Primarily degenerative findings with narrowing of interphalangeal articular space, although possible peripheral erosion along the head of the proximal phalanx great toe. Electronically Signed   By: Van Clines M.D.   On: 11/10/2016 10:27    Procedures Procedures (including critical care time)  Medications Ordered in ED Medications - No data to display   Initial Impression / Assessment and Plan / ED Course  I have reviewed the triage vital signs and the nursing notes.  Pertinent labs & imaging results that were available during my care of the patient were reviewed by me and considered in my medical decision making (see chart for details).  Clinical Course     Presenting with atraumatic left foot pain since this morning. NV in tact foot. Mild soft tissue swelling distally, but no evidence of cellulitis or overlying skin changes. Pain not Reproduced with palpation, only reproduced when he is dorsiflexing his foot. X-rays visualized  and without serious findings although there is some mild arthritis. Patient to continue supportive care measures for home. Given sports medicine follow-up as needed. Strict return and follow-up instructions reviewed. He expressed understanding of all discharge instructions and felt comfortable with the  plan of care.   Final Clinical Impressions(s) / ED Diagnoses   Final diagnoses:  Left foot pain    New Prescriptions New Prescriptions   No medications on file     Forde Dandy, MD 11/10/16 1044

## 2016-11-10 NOTE — Discharge Instructions (Signed)
Your x-ray shows mild arthritis of the foot.  Continue tylenol as needed for pain, keep foot elevated at rest.   Return for worsening symptoms, including fever, increased redness/swelling of foot, escalating pain or any other symptoms concerning to you.

## 2016-11-10 NOTE — ED Triage Notes (Signed)
Pt states left foot pain since last night, denies any injury, ambulates NAD

## 2016-11-11 ENCOUNTER — Encounter (HOSPITAL_COMMUNITY): Payer: Medicare Other

## 2016-11-14 ENCOUNTER — Encounter (HOSPITAL_COMMUNITY): Payer: Medicare Other

## 2016-11-16 ENCOUNTER — Encounter (HOSPITAL_COMMUNITY): Payer: Medicare Other

## 2016-11-18 ENCOUNTER — Encounter (HOSPITAL_COMMUNITY): Payer: Medicare Other

## 2016-11-23 ENCOUNTER — Other Ambulatory Visit: Payer: Self-pay | Admitting: Family Medicine

## 2016-11-29 ENCOUNTER — Telehealth: Payer: Self-pay | Admitting: *Deleted

## 2016-11-29 ENCOUNTER — Encounter: Payer: Self-pay | Admitting: *Deleted

## 2016-11-29 NOTE — Telephone Encounter (Signed)
MyChart message sent to pt to schedule AWV w/ [facility].

## 2016-12-12 ENCOUNTER — Other Ambulatory Visit: Payer: Self-pay | Admitting: Cardiology

## 2016-12-15 ENCOUNTER — Telehealth: Payer: Self-pay | Admitting: Family Medicine

## 2016-12-15 NOTE — Telephone Encounter (Signed)
At his age he should probably be seen to check his vital signs. Over night, rest, increase fluids, take plain Mucinex 2 x daily, zinc such as Coldeeze, probiotics, vitamin c 500 mg daily and elderberry will help fight off a viral infection

## 2016-12-15 NOTE — Telephone Encounter (Signed)
Caller name: Lelan Pons Relationship to patient: wife Can be reached: [phone]  Reason for call: wife called in stating pt has a productive cough and green mucus. appt scheduled for 12/22 with Percell Miller. Pt is weak also. Wife asking if they need to be seen or if Dr. Charlett Blake can call in abx. Please call to let her know.

## 2016-12-15 NOTE — Telephone Encounter (Signed)
Patients wife informed of PCP instructions and agreed.

## 2016-12-16 ENCOUNTER — Ambulatory Visit (INDEPENDENT_AMBULATORY_CARE_PROVIDER_SITE_OTHER): Payer: Medicare Other | Admitting: Medical

## 2016-12-16 ENCOUNTER — Encounter: Payer: Self-pay | Admitting: Medical

## 2016-12-16 ENCOUNTER — Ambulatory Visit: Payer: Medicare Other | Admitting: Family Medicine

## 2016-12-16 VITALS — BP 106/64 | HR 87 | Temp 97.9°F | Resp 16 | Ht 72.0 in | Wt 192.0 lb

## 2016-12-16 DIAGNOSIS — I208 Other forms of angina pectoris: Secondary | ICD-10-CM

## 2016-12-16 DIAGNOSIS — R55 Syncope and collapse: Secondary | ICD-10-CM | POA: Diagnosis not present

## 2016-12-16 LAB — CBC WITH DIFFERENTIAL/PLATELET
Basophils Absolute: 0.1 10*3/uL (ref 0.0–0.1)
Basophils Relative: 0.6 % (ref 0.0–3.0)
Eosinophils Absolute: 0.1 10*3/uL (ref 0.0–0.7)
Eosinophils Relative: 1.5 % (ref 0.0–5.0)
HCT: 40.6 % (ref 39.0–52.0)
Hemoglobin: 13.6 g/dL (ref 13.0–17.0)
Lymphocytes Relative: 14.5 % (ref 12.0–46.0)
Lymphs Abs: 1.4 10*3/uL (ref 0.7–4.0)
MCHC: 33.4 g/dL (ref 30.0–36.0)
MCV: 80.9 fl (ref 78.0–100.0)
Monocytes Absolute: 1.7 10*3/uL — ABNORMAL HIGH (ref 0.1–1.0)
Monocytes Relative: 17.7 % — ABNORMAL HIGH (ref 3.0–12.0)
Neutro Abs: 6.3 10*3/uL (ref 1.4–7.7)
Neutrophils Relative %: 65.7 % (ref 43.0–77.0)
Platelets: 175 10*3/uL (ref 150.0–400.0)
RBC: 5.02 Mil/uL (ref 4.22–5.81)
RDW: 15.4 % (ref 11.5–15.5)
WBC: 9.7 10*3/uL (ref 4.0–10.5)

## 2016-12-16 LAB — COMPREHENSIVE METABOLIC PANEL
ALT: 12 U/L (ref 0–53)
AST: 18 U/L (ref 0–37)
Albumin: 4.3 g/dL (ref 3.5–5.2)
Alkaline Phosphatase: 56 U/L (ref 39–117)
BUN: 19 mg/dL (ref 6–23)
CO2: 24 mEq/L (ref 19–32)
Calcium: 9.7 mg/dL (ref 8.4–10.5)
Chloride: 104 mEq/L (ref 96–112)
Creatinine, Ser: 1.42 mg/dL (ref 0.40–1.50)
GFR: 51.79 mL/min — ABNORMAL LOW (ref >60.00)
Glucose, Bld: 219 mg/dL — ABNORMAL HIGH (ref 70–99)
Potassium: 4.3 mEq/L (ref 3.5–5.1)
Sodium: 137 mEq/L (ref 135–145)
Total Bilirubin: 0.6 mg/dL (ref 0.2–1.2)
Total Protein: 7.5 g/dL (ref 6.0–8.3)

## 2016-12-16 MED ORDER — BENZONATATE 100 MG PO CAPS: 100.0000 mg | ORAL_CAPSULE | Freq: Three times a day (TID) | ORAL | 0 refills | Status: DC | PRN

## 2016-12-16 MED ORDER — FLUTICASONE PROPIONATE 50 MCG/ACT NA SUSP: 2.0000 | Freq: Every day | NASAL | 1 refills | Status: DC

## 2016-12-16 MED ORDER — DOXYCYCLINE HYCLATE 100 MG PO TABS: 100.0000 mg | ORAL_TABLET | Freq: Two times a day (BID) | ORAL | 0 refills | Status: DC

## 2016-12-16 NOTE — Patient Instructions (Addendum)
You appear to have bronchitis and sinusitis. Rest hydrate and tylenol for fever. I am prescribing cough medicine benzonatate , and doxycycline  antibiotic. For your nasal congestion  rx flonase  You should gradually get better. If not then notify us and would recommend a chest xray.  Will get cbc and cmp to evaluate your near syncopal episode.   I think may have been related to dehydration. Please hydrate well. Recommend propel. If any other near syncopal episode or if syncope then ED evaluation.(if any cardiac signs or symptoms as well)  Follow up in 7-10 days or as needed

## 2016-12-16 NOTE — Progress Notes (Signed)
Pre visit review using our clinic review tool, if applicable. No additional management support is needed unless otherwise documented below in the visit note/SLS  

## 2016-12-16 NOTE — Progress Notes (Signed)
Subjective:    Patient ID: Jack Wade, male    DOB: [birth year], 75 y.o.   MRN: [id number]  HPI  Pt in with recent 1wk of nasal congestion, mild st,  some chest congestion and some cough. Pt has some sinus pressure maxillary area.   Pt not much appetite and not eating much.  Pt woke up 4 am yesterday am. Martin Majestic to bathroom and felt very weak. Near syncope and had to kneel to floor. NO chest pain, no palpitation, and no ha. States recovered quickly. Also no sob, no shoulder pain and non jaw pain.  Checked his bp and was 95/50. He got back in bed and felt better. Later in am before came here 112/72.   Today he states he feels a lot better.    Review of Systems  Constitutional: Positive for chills, diaphoresis and fatigue.       Only very briefly during the mild near syncope event.  Feels much better today than yesterday. More energy.  HENT: Positive for congestion and sinus pressure.   Respiratory: Positive for cough. Negative for shortness of breath and wheezing.   Cardiovascular: Negative for chest pain and palpitations.  Musculoskeletal: Negative for back pain.       No arm pain. No shoulder pain.  Skin: Negative for pallor and rash.  Neurological: Negative for dizziness, syncope, weakness, numbness and headaches.  Hematological: Negative for adenopathy. Does not bruise/bleed easily.  Psychiatric/Behavioral: Negative for behavioral problems and confusion.    Past Medical History:  Diagnosis Date  . Aortic calcification ([place]) 02/09/2015  . Appendicitis   . Back pain 02/09/2015  . CAD (coronary artery disease)    2 stents  . Chicken pox   . Diabetes mellitus type 2 in obese ([place]) 04/11/2016  . Diabetes type 2, controlled ([place])   . Dysphagia, unspecified(787.20) 03/20/2014  . Hereditary and idiopathic peripheral neuropathy 02/09/2015  . Hyperlipidemia   . Hypertension   . Hypothyroidism   . Measles   . Medicare annual wellness visit, subsequent 06/14/2015  .  Melanoma ([place])    Scalp. 2012  . Mumps as a child  . Thyroid disease    [place] Hyperthyroidism     Social History   Social History  . Marital status: Married    Spouse name: N/A  . Number of children: 2  . Years of education: N/A   Occupational History  . Not on file.   Social History Main Topics  . Smoking status: Former Smoker    Packs/day: 2.00    Years: 30.00    Types: Cigarettes    Start date: 12/27/1983  . Smokeless tobacco: Never Used  . Alcohol use 0.0 oz/week     Comment: occasionally  . Drug use: No  . 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    Other Topics Concern  . Not on file   Social History Narrative  . No narrative on file    Past Surgical History:  Procedure Laterality Date  . APPENDECTOMY    . BACK SURGERY     x2 lumbar  . BACK SURGERY     late 1990s had 2 surgeries, first surgery lifted a heavy engine ruptured disds, at L4 and L5, cleaned discs no hardware very helpful. 2 years later while recovering from angiogram with weights applied to left leg caused a recurrence and required surgery again in same area with disc repair  . CARDIAC CATHETERIZATION    .  CARDIAC CATHETERIZATION N/A 05/11/2016   Procedure: Left Heart Cath and Coronary Angiography;  Surgeon: Belva Crome, MD;  Location: [facility];  Service: Cardiovascular;  Laterality: N/A;  . CARDIAC CATHETERIZATION N/A 05/11/2016   Procedure: Intravascular Pressure Wire/FFR Study;  Surgeon: Belva Crome, MD;  Location: [facility];  Service: Cardiovascular;  Laterality: N/A;  . CORONARY ANGIOPLASTY WITH STENT PLACEMENT     2 stents  . CORONARY ARTERY BYPASS GRAFT N/A 05/17/2016   Procedure: CORONARY ARTERY BYPASS GRAFTING (CABG) x 6 using left mammory artery and right greater saphenous vein harvested endoscopically. [place] to LAD, SVG to Diagonal, SVG Sequential to OM2 and ramus intermediate, SVG Sequential to RCA and PDA;   Surgeon: [name], MD;  Location: [facility];  Service: Open Heart Surgery;  Laterality: N/A;  . KNEE ARTHROSCOPY     right knee  . LUMBAR LAMINECTOMY/DECOMPRESSION MICRODISCECTOMY Bilateral 04/10/2015   Procedure: LUMBAR LAMINECTOMY/DECOMPRESSION MICRODISCECTOMY 2 LEVELS;  Surgeon: Ashok Pall, MD;  Location: [facility];  Service: Neurosurgery;  Laterality: Bilateral;  Bilateral L45 L5S1 Laminectomy and Foraminotomy  . ROTATOR CUFF REPAIR Right   . SKIN CANCER EXCISION     melanoma on scalp  . TEE WITHOUT CARDIOVERSION N/A 05/17/2016   Procedure: TRANSESOPHAGEAL ECHOCARDIOGRAM (TEE);  Surgeon: [name], MD;  Location: [facility];  Service: Open Heart Surgery;  Laterality: N/A;  . TONSILLECTOMY AND ADENOIDECTOMY    . WISDOM TOOTH EXTRACTION      Family History  Problem Relation Age of Onset  . Heart disease Father   . Emphysema Father   . Hypertension Father   . Cancer Father     lung/ healthy  . Heart disease Mother     Deceased  . Cancer Mother     breast  . Hypertension Mother   . Cancer Maternal Grandmother     colon  . Diabetes Brother     type 2  . Heart disease Brother   . Pulmonary embolism Son     vasculiti  . Depression Paternal Grandmother     No Known Allergies  Current Outpatient Prescriptions on File Prior to Visit  Medication Sig Dispense Refill  . aspirin 81 MG tablet Take 81 mg by mouth daily.    Marland Kitchen glipiZIDE (GLUCOTROL) 5 MG tablet 1 tab po in am and 1/2 tab po in pm 360 tablet 2  . glucose blood (ONETOUCH VERIO) test strip Use as directed three times daily to check blood sugar.  DX E11.8 300 each 3  . levothyroxine (SYNTHROID, LEVOTHROID) 100 MCG tablet TAKE 1 TABLET (100 MCG TOTAL) BY MOUTH DAILY BEFORE BREAKFAST. 90 tablet 1  . metFORMIN (GLUCOPHAGE-XR) 500 MG 24 hr tablet Take 2 tablets (1,000 mg total) by mouth 2 (two) times daily. 360 tablet 2  . ONE TOUCH LANCETS MISC Use as directed three times daily to check blood sugar.  DX E11.8 300  each 3  . rosuvastatin (CRESTOR) 40 MG tablet Take 1 tablet (40 mg total) by mouth daily. 90 tablet 3  . albuterol (PROVENTIL HFA;VENTOLIN HFA) 108 (90 Base) MCG/ACT inhaler Inhale 2 puffs into the lungs every 6 (six) hours as needed for wheezing or shortness of breath. (Patient not taking: Reported on 12/16/2016) 1 Inhaler 0  . fluticasone (FLOVENT HFA) 110 MCG/ACT inhaler Inhale 2 puffs into the lungs 2 (two) times daily. (Patient not taking: Reported on 12/16/2016) 1 Inhaler 3   No current facility-administered medications on file prior to visit.  BP 106/64 (BP Location: Right Arm, Patient Position: Sitting, Cuff Size: Large)   Pulse 87   Temp 97.9 F (36.6 C) (Oral)   Resp 16   Ht 6' (1.829 m)   Wt 192 lb (87.1 kg)   SpO2 98%   BMI 26.04 kg/m       Objective:   Physical Exam  General  Mental Status - Alert. General Appearance - Well groomed. Not in acute distress.  Skin Rashes- No Rashes.  HEENT Head- Normal. Ear Auditory Canal - Left- Normal. Right - Normal.Tympanic Membrane- Left- Normal. Right- Normal. Eye Sclera/Conjunctiva- Left- Normal. Right- Normal. Nose & Sinuses Nasal Mucosa- Left-  Boggy and Congested. Right-  Boggy and  Congested.Bilateral maxillary and frontal sinus pressure. Mouth & Throat Lips: Upper Lip- Normal: no dryness, cracking, pallor, cyanosis, or vesicular eruption. Lower Lip-Normal: no dryness, cracking, pallor, cyanosis or vesicular eruption. Buccal Mucosa- Bilateral- No Aphthous ulcers. Oropharynx- No Discharge or Erythema. Tonsils: Characteristics- Bilateral- No Erythema or Congestion. Size/Enlargement- Bilateral- No enlargement. Discharge- bilateral-None.  Neck Neck- Supple. No Masses.   Chest and Lung Exam Auscultation: Breath Sounds:-Clear even and unlabored.  Cardiovascular Auscultation:Rythm- Regular, rate and rhythm. Murmurs & Other Heart Sounds:Ausculatation of the heart reveal- No Murmurs.  Lymphatic Head &  Neck General Head & Neck Lymphatics: Bilateral: Description- No Localized lymphadenopathy.       Assessment & Plan:   You appear to have bronchitis and sinusitis. Rest hydrate and tylenol for fever. I am prescribing cough medicine benzonatate , and doxycycline  antibiotic. For your nasal congestion  rx flonase  You should gradually get better. If not then notify us and would recommend a chest xray.  Will get cbc and cmp to evaluate your near syncopal episode.   I think may have been related to dehydration. Please hydrate well. Recommend propel. If any other near syncopal episode or if syncope then ED evaluation.(if any cardiac signs or symptoms as well)  Follow up in 7-10 days or as needed  Jack Wade, Jack Wade, [employer]

## 2016-12-17 IMAGING — US US AORTA SCREENING (MEDICARE)
1 series · 14 of 20 positions shown · non-contrast
Comparison: None.

CLINICAL DATA: Medicare screening exam for abdominal aortic
aneurysm.

EXAM:
ABDOMINAL AORTA SCREENING ULTRASOUND
TECHNIQUE: Ultrasound examination of the abdominal aorta was performed as a
screening evaluation for abdominal aortic aneurysm.

[Series 1: us aorta screening (medicare) · 0.25mm/px · 14 of 20 slices shown]
[im 1/20]
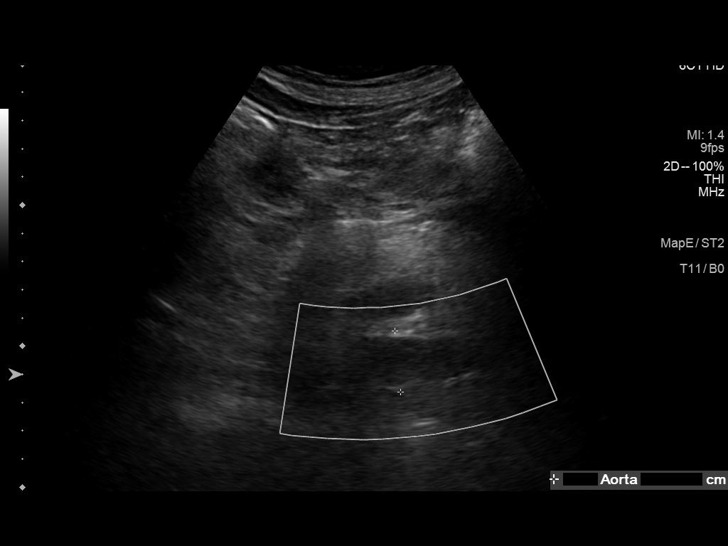
[im 3/20]
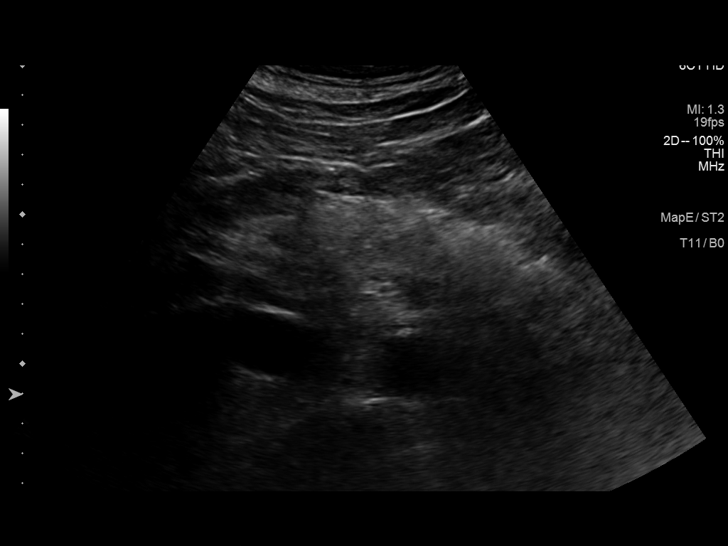
[im 4/20]
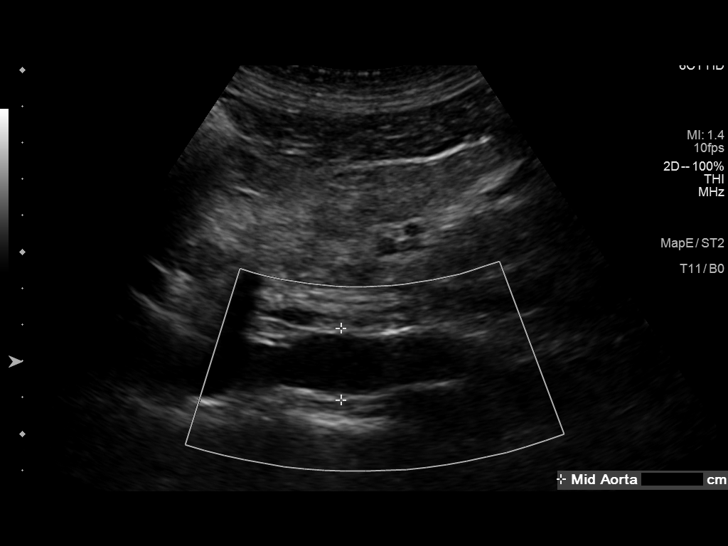
[im 6/20]
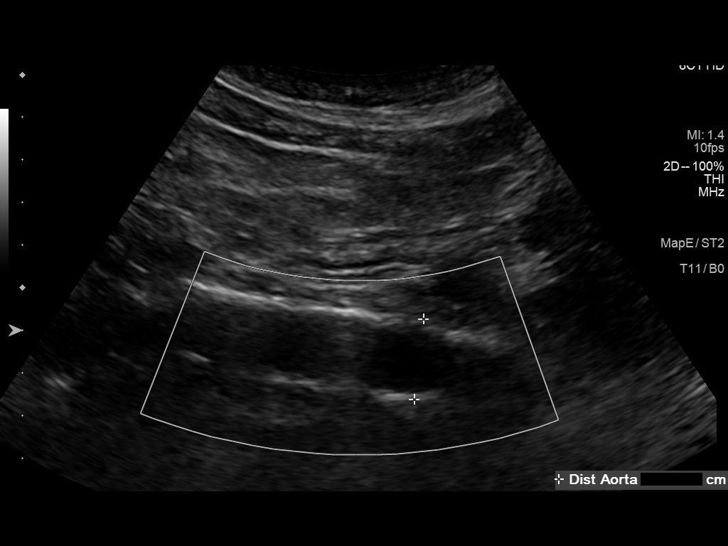
[im 7/20]
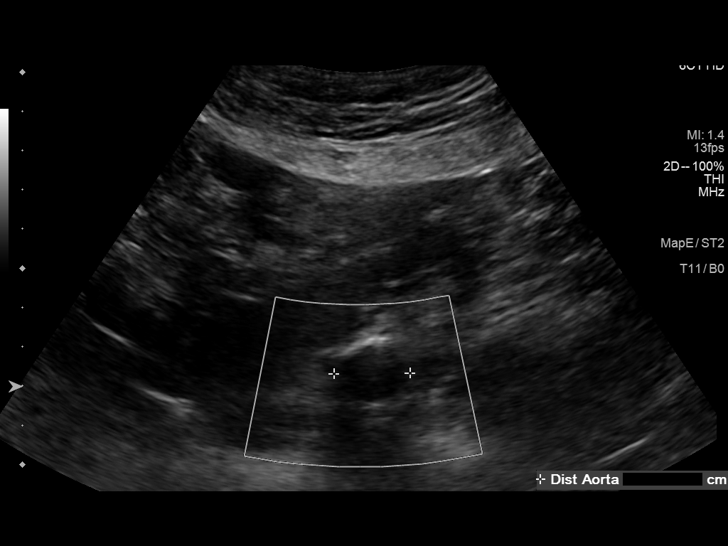
[im 8/20]
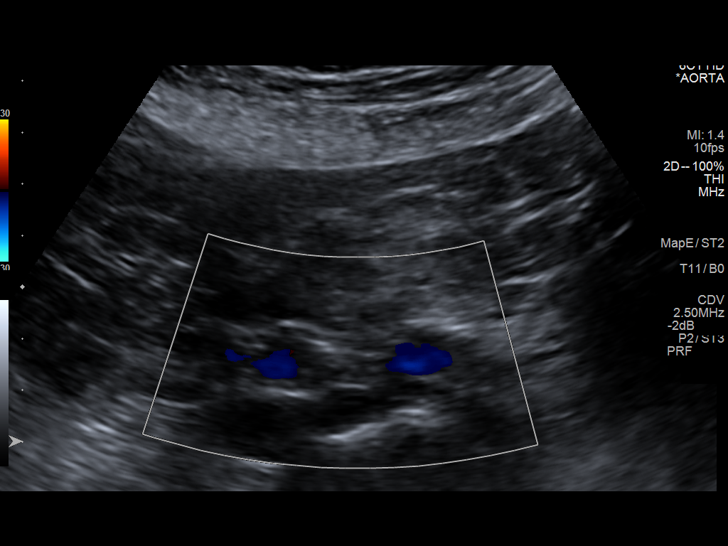
[im 10/20]
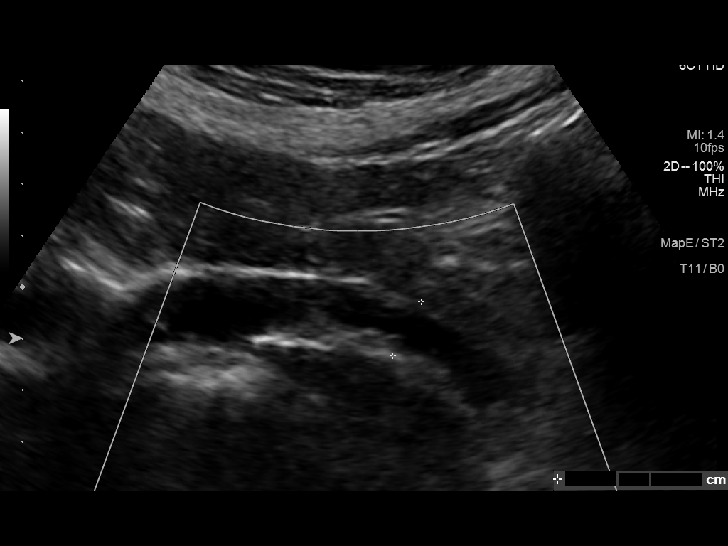
[im 11/20]
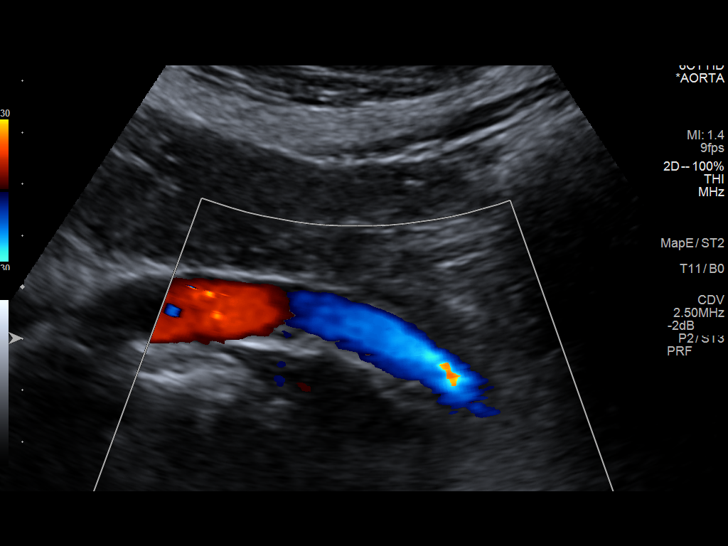
[im 13/20]
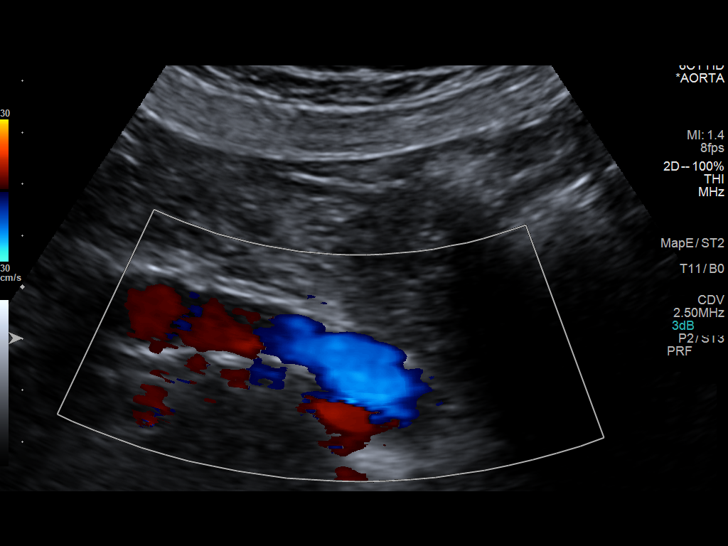
[im 14/20]
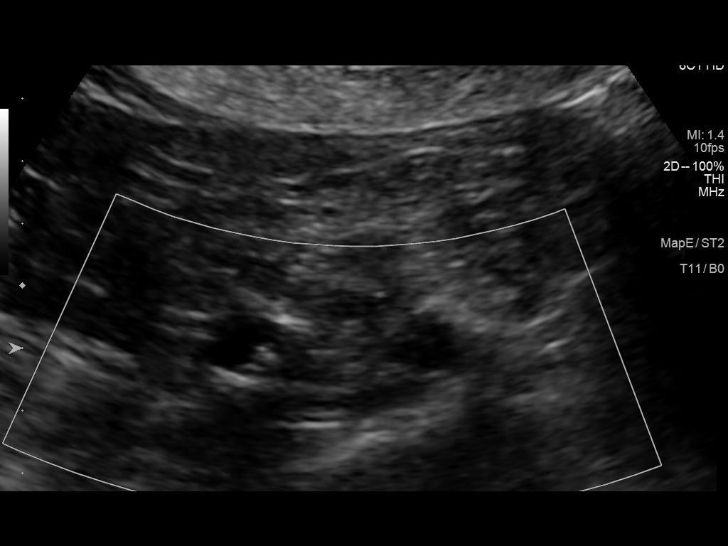
[im 16/20]
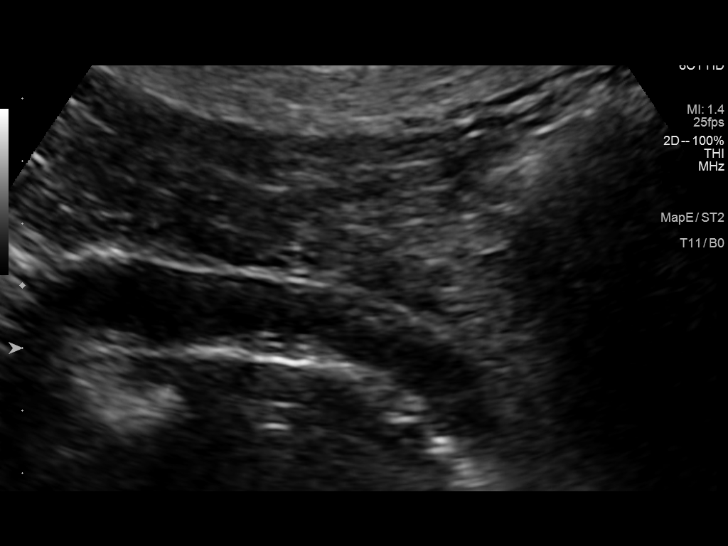
[im 17/20]
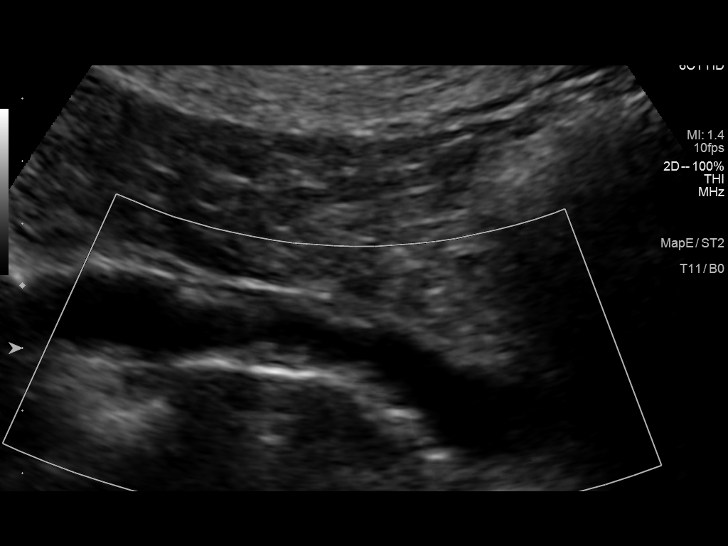
[im 18/20]
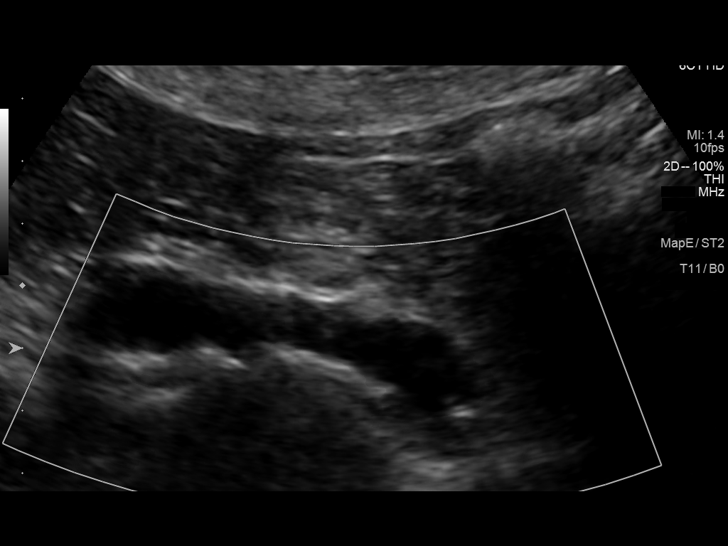
[im 20/20]
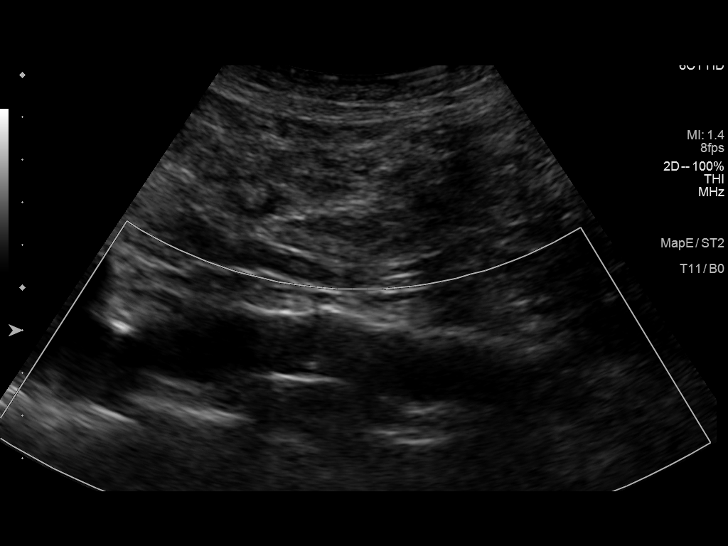

[14 of 20 positions shown; findings below may reference images not displayed]

FINDINGS: Abdominal Aorta

No aneurysm identified.  Extensive atheromatous plaque noted.

Maximum AP

Diameter:  2.2 cm

Maximum TRV

Diameter: 2.2 cm
IMPRESSION: No sonographic evidence for abdominal aortic aneurysm.

## 2017-01-17 ENCOUNTER — Encounter: Payer: Self-pay | Admitting: Family Medicine

## 2017-01-17 NOTE — Telephone Encounter (Signed)
Error:[id number] ° °

## 2017-01-17 NOTE — Telephone Encounter (Signed)
Contacted patient regarding scheduling medicare wellness appointment will call back, patient stated " a lot is going on right now and I have to check my calendar"

## 2017-01-18 ENCOUNTER — Telehealth: Payer: Self-pay | Admitting: Family Medicine

## 2017-01-18 NOTE — Telephone Encounter (Signed)
Jack Wade  01/18/17 2:38 PM  Note    Received a voicemail from [facility] at 2:18pm stating that they were having problems with Epic this morning and they could not schedule this patient. They requested that patient get an appointment within the next 24 hours.   Called patient's wife and scheduled an appointment for tomorrow 01/19/17 with Dr. Larose Kells at 2:45am.

## 2017-01-18 NOTE — Telephone Encounter (Signed)
Patient Name: Jack Wade  DOB: Aug 12, 1941    Initial Comment husbands feet are cracking open, he is diabetic    Nurse Assessment      Guidelines    Guideline Title Affirmed Question Affirmed Notes       Final Disposition User   FINAL ATTEMPT MADE - no message left [place], RN, [employer]

## 2017-01-18 NOTE — Telephone Encounter (Signed)
Received a voicemail from [facility] at 2:18pm stating that they were having problems with Epic this morning and they could not schedule this patient. They requested that patient get an appointment within the next 24 hours.   Called patient's wife and scheduled an appointment for tomorrow 01/19/17 with Dr. Larose Kells at 2:45am.

## 2017-01-18 NOTE — Telephone Encounter (Signed)
Patient's wife called today stating that she needed to get the patient in to see a doctor as soon as possible. She stated that the patient is diabetic and he has been self treating wounds on his feet where they have been cracking open. I explained to the patient's wife that I would need to send her to a triage nurse and she stated that she did not want to speak with a nurse she wanted to see a doctor right away. I explained that it is office policy to send situations like this to the triage nurse and they would determine if the patient needed to been sooner than we could see him. She agreed. Transferred to [facility].

## 2017-01-19 ENCOUNTER — Encounter: Payer: Self-pay | Admitting: Internal Medicine

## 2017-01-19 ENCOUNTER — Ambulatory Visit (INDEPENDENT_AMBULATORY_CARE_PROVIDER_SITE_OTHER): Payer: Medicare Other | Admitting: Internal Medicine

## 2017-01-19 VITALS — BP 124/78 | HR 79 | Temp 97.5°F | Resp 14 | Ht 72.0 in | Wt 194.2 lb

## 2017-01-19 DIAGNOSIS — L853 Xerosis cutis: Secondary | ICD-10-CM | POA: Diagnosis not present

## 2017-01-19 MED ORDER — HYDROCORTISONE 2.5 % EX CREA: TOPICAL_CREAM | Freq: Two times a day (BID) | CUTANEOUS | 0 refills | Status: DC

## 2017-01-19 MED ORDER — KETOCONAZOLE 2 % EX CREA: 1.0000 "application " | TOPICAL_CREAM | Freq: Every day | CUTANEOUS | 0 refills | Status: DC

## 2017-01-19 NOTE — Patient Instructions (Signed)
Use of a 50-50 combination of hydrocortisone and Nizoral twice a day for 10 days  Use Aveeno lotion twice a day  Once your better, get OTC UREA cream  twice a day.  Watch for infections  Call if not improving

## 2017-01-19 NOTE — Progress Notes (Signed)
Subjective:    Patient ID: Jack Wade, male    DOB: 18-May-1941, 76 y.o.   MRN: [id number]  DOS:  01/19/2017 Type of visit - description : Acute visit Interval history: Long history of dry skin, he noted two cracks on his feet. He is using OTC moisturizing without much success. He has a history of neuropathy and peripheral vascular disease   Review of Systems Denies skin redness, discharge. No fever chills No major problems with paresthesias. No claudication  Past Medical History:  Diagnosis Date  . Aortic calcification ([place]) 02/09/2015  . Appendicitis   . Back pain 02/09/2015  . CAD (coronary artery disease)    2 stents  . Chicken pox   . Diabetes mellitus type 2 in obese ([place]) 04/11/2016  . Diabetes type 2, controlled ([place])   . Dysphagia, unspecified(787.20) 03/20/2014  . Hereditary and idiopathic peripheral neuropathy 02/09/2015  . Hyperlipidemia   . Hypertension   . Hypothyroidism   . Measles   . Medicare annual wellness visit, subsequent 06/14/2015  . Melanoma ([place])    Scalp. 2012  . Mumps as a child  . Thyroid disease    [place] Hyperthyroidism    Past Surgical History:  Procedure Laterality Date  . APPENDECTOMY    . BACK SURGERY     x2 lumbar  . BACK SURGERY     late 1990s had 2 surgeries, first surgery lifted a heavy engine ruptured disds, at L4 and L5, cleaned discs no hardware very helpful. 2 years later while recovering from angiogram with weights applied to left leg caused a recurrence and required surgery again in same area with disc repair  . CARDIAC CATHETERIZATION    . CARDIAC CATHETERIZATION N/A 05/11/2016   Procedure: Left Heart Cath and Coronary Angiography;  Surgeon: Belva Crome, MD;  Location: [facility];  Service: Cardiovascular;  Laterality: N/A;  . CARDIAC CATHETERIZATION N/A 05/11/2016   Procedure: Intravascular Pressure Wire/FFR Study;  Surgeon: Belva Crome, MD;  Location: [facility];  Service: Cardiovascular;   Laterality: N/A;  . CORONARY ANGIOPLASTY WITH STENT PLACEMENT     2 stents  . CORONARY ARTERY BYPASS GRAFT N/A 05/17/2016   Procedure: CORONARY ARTERY BYPASS GRAFTING (CABG) x 6 using left mammory artery and right greater saphenous vein harvested endoscopically. [place] to LAD, SVG to Diagonal, SVG Sequential to OM2 and ramus intermediate, SVG Sequential to RCA and PDA;  Surgeon: [name], MD;  Location: [facility];  Service: Open Heart Surgery;  Laterality: N/A;  . KNEE ARTHROSCOPY     right knee  . LUMBAR LAMINECTOMY/DECOMPRESSION MICRODISCECTOMY Bilateral 04/10/2015   Procedure: LUMBAR LAMINECTOMY/DECOMPRESSION MICRODISCECTOMY 2 LEVELS;  Surgeon: Ashok Pall, MD;  Location: [facility];  Service: Neurosurgery;  Laterality: Bilateral;  Bilateral L45 L5S1 Laminectomy and Foraminotomy  . ROTATOR CUFF REPAIR Right   . SKIN CANCER EXCISION     melanoma on scalp  . TEE WITHOUT CARDIOVERSION N/A 05/17/2016   Procedure: TRANSESOPHAGEAL ECHOCARDIOGRAM (TEE);  Surgeon: [name], MD;  Location: [facility];  Service: Open Heart Surgery;  Laterality: N/A;  . TONSILLECTOMY AND ADENOIDECTOMY    . WISDOM TOOTH EXTRACTION      Social History   Social History  . Marital status: Married    Spouse name: N/A  . Number of children: 2  . Years of education: N/A   Occupational History  . Not on file.   Social History Main Topics  . Smoking status: Former Smoker  Packs/day: 2.00    Years: 30.00    Types: Cigarettes    Start date: 12/27/1983  . Smokeless tobacco: Never Used  . Alcohol use 0.0 oz/week     Comment: occasionally  . Drug use: No  . 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    Other Topics Concern  . Not on file   Social History Narrative  . No narrative on file      Allergies as of 01/19/2017   No Known Allergies     Medication List       Accurate as of 01/19/17  8:31 PM. Always use your most recent med list.            albuterol 108 (90 Base) MCG/ACT inhaler Commonly known as:  PROVENTIL HFA;VENTOLIN HFA Inhale 2 puffs into the lungs every 6 (six) hours as needed for wheezing or shortness of breath.   aspirin 81 MG tablet Take 81 mg by mouth daily.   fluticasone 110 MCG/ACT inhaler Commonly known as:  FLOVENT HFA Inhale 2 puffs into the lungs 2 (two) times daily.   fluticasone 50 MCG/ACT nasal spray Commonly known as:  FLONASE Place 2 sprays into both nostrils daily.   glipiZIDE 5 MG tablet Commonly known as:  GLUCOTROL 1 tab po in am and 1/2 tab po in pm   glucose blood test strip Commonly known as:  ONETOUCH VERIO Use as directed three times daily to check blood sugar.  DX E11.8   hydrocortisone 2.5 % cream Apply topically 2 (two) times daily.   ketoconazole 2 % cream Commonly known as:  NIZORAL Apply 1 application topically daily.   levothyroxine 100 MCG tablet Commonly known as:  SYNTHROID, LEVOTHROID TAKE 1 TABLET (100 MCG TOTAL) BY MOUTH DAILY BEFORE BREAKFAST.   metFORMIN 500 MG 24 hr tablet Commonly known as:  GLUCOPHAGE-XR Take 2 tablets (1,000 mg total) by mouth 2 (two) times daily.   ONE TOUCH LANCETS Misc Use as directed three times daily to check blood sugar.  DX E11.8   rosuvastatin 40 MG tablet Commonly known as:  CRESTOR Take 1 tablet (40 mg total) by mouth daily.          Objective:   Physical Exam BP 124/78 (BP Location: Right Arm, Patient Position: Sitting, Cuff Size: Normal)   Pulse 79   Temp 97.5 F (36.4 C) (Oral)   Resp 14   Ht 6' (1.829 m)   Wt 194 lb 4 oz (88.1 kg)   SpO2 94%   BMI 26.35 kg/m  General:   Well developed, well nourished . NAD.  HEENT:  Normocephalic . Face symmetric, atraumatic  Lower extremities: Decreased but present pedal pulses. Good capillary refills. No edema. Skin: Skin of the feet, plantar aspect is very dry slightly thick particularly at the heels. Has a 1.5 cm open at the right great toe and left heel.  No redness, swelling or discharge Neurologic:  alert & oriented X3.  Speech normal, gait appropriate for age and unassisted Psych--  Cognition and judgment appear intact.  Cooperative with normal attention span and concentration.  Behavior appropriate. No anxious or depressed appearing.      Assessment & Plan:   75 year old gentleman with diabetes, neuropathy, HTN, CAD, peripheral vascular disease, hyperlipidemia, hypothyroidism, history of melanoma, presents with the following concerns:  Dry skin, crackling soles: Aveeno twice daily hydrocortisone/Nizoral twice daily for 10 days (for possible fungal infection) Once is better, start  OTC UREA once or  twice a day to prevent sick/scaly skin. Call if not better, watch for signs of infection (redness, swelling, discharge)

## 2017-01-19 NOTE — Progress Notes (Signed)
Pre visit review using our clinic review tool, if applicable. No additional management support is needed unless otherwise documented below in the visit note. 

## 2017-02-01 NOTE — Progress Notes (Signed)
HPI: FU coronary artery disease. Lower extremity Dopplers February 2016 showed an occluded right anterior tibial artery. Abdominal ultrasound February 2016 showed no aneurysm. Patient had cardiac catheterization May 2017 secondary to continued exertional chest pain. He was found to have severe three-vessel coronary artery disease and normal LV function. Echocardiogram showed normal LV systolic function, grade 1 diastolic dysfunction and mildly dilated aortic root. Note carotid Dopplers May 2017 Prior to surgery showed 1-39% bilateral stenosis. Patient had coronary artery bypass and graft With LIMA to the LAD, saphenous vein graft to second diagonal, sequential saphenous vein graft to the intermediate and distal circumflex, and sequential saphenous vein graft to the right coronary artery and PDA. Since he was last seen, the patient denies any dyspnea on exertion, orthopnea, PND, pedal edema, palpitations, syncope or chest pain. Occasional dizziness with standing.    Current Outpatient Prescriptions  Medication Sig Dispense Refill  . aspirin 81 MG tablet Take 81 mg by mouth daily.    Marland Kitchen glipiZIDE (GLUCOTROL) 5 MG tablet 1 tab po in am and 1/2 tab po in pm 360 tablet 2  . glucose blood (ONETOUCH VERIO) test strip Use as directed three times daily to check blood sugar.  DX E11.8 300 each 3  . levothyroxine (SYNTHROID, LEVOTHROID) 100 MCG tablet TAKE 1 TABLET (100 MCG TOTAL) BY MOUTH DAILY BEFORE BREAKFAST. 90 tablet 1  . metFORMIN (GLUCOPHAGE-XR) 500 MG 24 hr tablet Take 2 tablets (1,000 mg total) by mouth 2 (two) times daily. 360 tablet 2  . ONE TOUCH LANCETS MISC Use as directed three times daily to check blood sugar.  DX E11.8 300 each 3  . rosuvastatin (CRESTOR) 40 MG tablet Take 1 tablet (40 mg total) by mouth daily. 90 tablet 3   No current facility-administered medications for this visit.      Past Medical History:  Diagnosis Date  . Aortic calcification ([place]) 02/09/2015  .  Appendicitis   . Back pain 02/09/2015  . CAD (coronary artery disease)    2 stents  . Chicken pox   . Diabetes mellitus type 2 in obese ([place]) 04/11/2016  . Diabetes type 2, controlled ([place])   . Dysphagia, unspecified(787.20) 03/20/2014  . Hereditary and idiopathic peripheral neuropathy 02/09/2015  . Hyperlipidemia   . Hypertension   . Hypothyroidism   . Measles   . Medicare annual wellness visit, subsequent 06/14/2015  . Melanoma ([place])    Scalp. 2012  . Mumps as a child  . Thyroid disease    [place] Hyperthyroidism    Past Surgical History:  Procedure Laterality Date  . APPENDECTOMY    . BACK SURGERY     x2 lumbar  . BACK SURGERY     late 1990s had 2 surgeries, first surgery lifted a heavy engine ruptured disds, at L4 and L5, cleaned discs no hardware very helpful. 2 years later while recovering from angiogram with weights applied to left leg caused a recurrence and required surgery again in same area with disc repair  . CARDIAC CATHETERIZATION    . CARDIAC CATHETERIZATION N/A 05/11/2016   Procedure: Left Heart Cath and Coronary Angiography;  Surgeon: Belva Crome, MD;  Location: [facility];  Service: Cardiovascular;  Laterality: N/A;  . CARDIAC CATHETERIZATION N/A 05/11/2016   Procedure: Intravascular Pressure Wire/FFR Study;  Surgeon: Belva Crome, MD;  Location: [facility];  Service: Cardiovascular;  Laterality: N/A;  . CORONARY ANGIOPLASTY WITH STENT PLACEMENT     2 stents  .  CORONARY ARTERY BYPASS GRAFT N/A 05/17/2016   Procedure: CORONARY ARTERY BYPASS GRAFTING (CABG) x 6 using left mammory artery and right greater saphenous vein harvested endoscopically. [place] to LAD, SVG to Diagonal, SVG Sequential to OM2 and ramus intermediate, SVG Sequential to RCA and PDA;  Surgeon: [name], MD;  Location: [facility];  Service: Open Heart Surgery;  Laterality: N/A;  . KNEE ARTHROSCOPY     right knee  . LUMBAR LAMINECTOMY/DECOMPRESSION MICRODISCECTOMY Bilateral 04/10/2015    Procedure: LUMBAR LAMINECTOMY/DECOMPRESSION MICRODISCECTOMY 2 LEVELS;  Surgeon: Ashok Pall, MD;  Location: [facility];  Service: Neurosurgery;  Laterality: Bilateral;  Bilateral L45 L5S1 Laminectomy and Foraminotomy  . ROTATOR CUFF REPAIR Right   . SKIN CANCER EXCISION     melanoma on scalp  . TEE WITHOUT CARDIOVERSION N/A 05/17/2016   Procedure: TRANSESOPHAGEAL ECHOCARDIOGRAM (TEE);  Surgeon: [name], MD;  Location: [facility];  Service: Open Heart Surgery;  Laterality: N/A;  . TONSILLECTOMY AND ADENOIDECTOMY    . WISDOM TOOTH EXTRACTION      Social History   Social History  . Marital status: Married    Spouse name: N/A  . Number of children: 2  . Years of education: N/A   Occupational History  . Not on file.   Social History Main Topics  . Smoking status: Former Smoker    Packs/day: 2.00    Years: 30.00    Types: Cigarettes    Start date: 12/27/1983  . Smokeless tobacco: Never Used  . Alcohol use 0.0 oz/week     Comment: occasionally  . Drug use: No  . 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    Other Topics Concern  . Not on file   Social History Narrative  . No narrative on file    Family History  Problem Relation Age of Onset  . Heart disease Father   . Emphysema Father   . Hypertension Father   . Cancer Father     lung/ healthy  . Heart disease Mother     Deceased  . Cancer Mother     breast  . Hypertension Mother   . Cancer Maternal Grandmother     colon  . Diabetes Brother     type 2  . Heart disease Brother   . Pulmonary embolism Son     vasculiti  . Depression Paternal Grandmother     ROS: no fevers or chills, productive cough, hemoptysis, dysphasia, odynophagia, melena, hematochezia, dysuria, hematuria, rash, seizure activity, orthopnea, PND, pedal edema, claudication. Remaining systems are negative.  Physical Exam: Well-developed well-nourished in no acute distress.  Skin is  warm and dry.  HEENT is normal.  Neck is supple. No bruits Chest is clear to auscultation with normal expansion.  Cardiovascular exam is regular rate and rhythm.  Abdominal exam nontender or distended. No masses palpated. Extremities show no edema. neuro grossly intact  ECG-sinus rhythm with first-degree AV block. Left anterior fascicular block. Right bundle branch block.  A/P  1 coronary artery disease-status post coronary artery bypass and graft. No cardiac symptoms. Continue aspirin and statin.  2 vascular disease-continue aspirin and statin.  3 hyperlipidemia-continue statin.  4 Orthostatic hypotension-patient is describing some dizziness with standing. No syncope. I have asked him to maintain hydration and increased sodium intake.  Kirk Ruths, MD

## 2017-02-08 ENCOUNTER — Ambulatory Visit (INDEPENDENT_AMBULATORY_CARE_PROVIDER_SITE_OTHER): Payer: Medicare Other | Admitting: Cardiology

## 2017-02-08 ENCOUNTER — Encounter: Payer: Self-pay | Admitting: Cardiology

## 2017-02-08 VITALS — BP 111/67 | HR 60 | Ht 72.0 in | Wt 192.0 lb

## 2017-02-08 DIAGNOSIS — I2510 Atherosclerotic heart disease of native coronary artery without angina pectoris: Secondary | ICD-10-CM

## 2017-02-08 DIAGNOSIS — E7800 Pure hypercholesterolemia, unspecified: Secondary | ICD-10-CM | POA: Diagnosis not present

## 2017-02-08 DIAGNOSIS — I25810 Atherosclerosis of coronary artery bypass graft(s) without angina pectoris: Secondary | ICD-10-CM | POA: Diagnosis not present

## 2017-02-08 NOTE — Patient Instructions (Signed)
Your physician wants you to follow-up in: ONE YEAR WITH DR CRENSHAW You will receive a reminder letter in the mail two months in advance. If you don't receive a letter, please call our office to schedule the follow-up appointment.   If you need a refill on your cardiac medications before your next appointment, please call your pharmacy.  

## 2017-02-22 ENCOUNTER — Telehealth: Payer: Self-pay | Admitting: Family Medicine

## 2017-02-22 NOTE — Telephone Encounter (Signed)
lvm advising patient to schedule AWV °

## 2017-02-27 ENCOUNTER — Telehealth: Payer: Self-pay | Admitting: Family Medicine

## 2017-02-27 NOTE — Telephone Encounter (Signed)
Called patient regarding awv. Lvm msg for patient to call office to schedule appt.

## 2017-03-06 DIAGNOSIS — Z08 Encounter for follow-up examination after completed treatment for malignant neoplasm: Secondary | ICD-10-CM | POA: Diagnosis not present

## 2017-03-06 DIAGNOSIS — L219 Seborrheic dermatitis, unspecified: Secondary | ICD-10-CM | POA: Diagnosis not present

## 2017-03-06 DIAGNOSIS — L570 Actinic keratosis: Secondary | ICD-10-CM | POA: Diagnosis not present

## 2017-03-06 DIAGNOSIS — Z85820 Personal history of malignant melanoma of skin: Secondary | ICD-10-CM | POA: Diagnosis not present

## 2017-04-12 ENCOUNTER — Other Ambulatory Visit: Payer: Self-pay | Admitting: Family Medicine

## 2017-05-03 ENCOUNTER — Other Ambulatory Visit: Payer: Self-pay | Admitting: Cardiology

## 2017-05-03 DIAGNOSIS — E785 Hyperlipidemia, unspecified: Secondary | ICD-10-CM

## 2017-05-17 DIAGNOSIS — L570 Actinic keratosis: Secondary | ICD-10-CM | POA: Diagnosis not present

## 2017-05-17 DIAGNOSIS — L249 Irritant contact dermatitis, unspecified cause: Secondary | ICD-10-CM | POA: Diagnosis not present

## 2017-05-18 ENCOUNTER — Other Ambulatory Visit: Payer: Self-pay | Admitting: Family Medicine

## 2017-06-13 ENCOUNTER — Telehealth: Payer: Self-pay | Admitting: Family Medicine

## 2017-06-13 NOTE — Telephone Encounter (Signed)
Called pt to r/s AWV with Hoyle Sauer at 3:30pm on 07/04/17. Pt has F/U visit with Dr. Charlett Blake at 4:45pm on 07/04/2017. Pt r/s AWV for 4pm on the same day.

## 2017-06-29 NOTE — Progress Notes (Signed)
Subjective:   Jack Wade is a 76 y.o. male who presents for Medicare Annual/Subsequent preventive examination.  Review of Systems:  No ROS.  Medicare Wellness Visit. Additional risk factors are reflected in the social history.  Cardiac Risk Factors include: advanced age (>82men, >18 women);diabetes mellitus;hypertension;male gender Sleep patterns: Wakes once to urinate. Sleeps 6-9 hrs. Feels rested.  Home Safety/Smoke Alarms: Feels safe in home. Smoke alarms in place.  Living environment; residence and Firearm Safety: Lives with wife. 2 stories. No guns. Seat Belt Safety/Bike Helmet: Wears seat belt.   Counseling:   Eye Exam- Wearing glasses. Dr.Digby yearly. Dental- Dr.Steinfield yearly.   Male:   CCS-  Last 03/19/13: Normal PSA- No results found for: PSA      Objective:    Vitals: BP (!) 106/58 (BP Location: Left Arm, Patient Position: Sitting, Cuff Size: Normal)   Pulse 82   Ht 6' (1.829 m)   Wt 187 lb 6.4 oz (85 kg)   SpO2 98%   BMI 25.42 kg/m   Body mass index is 25.42 kg/m.  Tobacco History  Smoking Status  . Former Smoker  . Packs/day: 2.00  . Years: 30.00  . Types: Cigarettes  . Start date: 12/27/1983  Smokeless Tobacco  . Never Used     Counseling given: Not Answered   Past Medical History:  Diagnosis Date  . Aortic calcification ([place]) 02/09/2015  . Appendicitis   . Back pain 02/09/2015  . CAD (coronary artery disease)    2 stents  . Chicken pox   . Diabetes mellitus type 2 in obese ([place]) 04/11/2016  . Diabetes type 2, controlled ([place])   . Dysphagia, unspecified(787.20) 03/20/2014  . Hereditary and idiopathic peripheral neuropathy 02/09/2015  . Hyperlipidemia   . Hypertension   . Hypothyroidism   . Measles   . Medicare annual wellness visit, subsequent 06/14/2015  . Melanoma ([place])    Scalp. 2012  . Mumps as a child  . Thyroid disease    [place] Hyperthyroidism   Past Surgical History:  Procedure Laterality Date  . APPENDECTOMY    .  BACK SURGERY     x2 lumbar  . BACK SURGERY     late 1990s had 2 surgeries, first surgery lifted a heavy engine ruptured disds, at L4 and L5, cleaned discs no hardware very helpful. 2 years later while recovering from angiogram with weights applied to left leg caused a recurrence and required surgery again in same area with disc repair  . CARDIAC CATHETERIZATION    . CARDIAC CATHETERIZATION N/A 05/11/2016   Procedure: Left Heart Cath and Coronary Angiography;  Surgeon: Belva Crome, MD;  Location: [facility];  Service: Cardiovascular;  Laterality: N/A;  . CARDIAC CATHETERIZATION N/A 05/11/2016   Procedure: Intravascular Pressure Wire/FFR Study;  Surgeon: Belva Crome, MD;  Location: [facility];  Service: Cardiovascular;  Laterality: N/A;  . CORONARY ANGIOPLASTY WITH STENT PLACEMENT     2 stents  . CORONARY ARTERY BYPASS GRAFT N/A 05/17/2016   Procedure: CORONARY ARTERY BYPASS GRAFTING (CABG) x 6 using left mammory artery and right greater saphenous vein harvested endoscopically. [place] to LAD, SVG to Diagonal, SVG Sequential to OM2 and ramus intermediate, SVG Sequential to RCA and PDA;  Surgeon: [name], MD;  Location: [facility];  Service: Open Heart Surgery;  Laterality: N/A;  . KNEE ARTHROSCOPY     right knee  . LUMBAR LAMINECTOMY/DECOMPRESSION MICRODISCECTOMY Bilateral 04/10/2015   Procedure: LUMBAR LAMINECTOMY/DECOMPRESSION MICRODISCECTOMY 2 LEVELS;  Surgeon: Ashok Pall, MD;  Location: [facility];  Service: Neurosurgery;  Laterality: Bilateral;  Bilateral L45 L5S1 Laminectomy and Foraminotomy  . ROTATOR CUFF REPAIR Right   . SKIN CANCER EXCISION     melanoma on scalp  . TEE WITHOUT CARDIOVERSION N/A 05/17/2016   Procedure: TRANSESOPHAGEAL ECHOCARDIOGRAM (TEE);  Surgeon: [name], MD;  Location: [facility];  Service: Open Heart Surgery;  Laterality: N/A;  . TONSILLECTOMY AND ADENOIDECTOMY    . WISDOM TOOTH EXTRACTION     Family History  Problem Relation Age of  Onset  . Heart disease Father   . Emphysema Father   . Hypertension Father   . Cancer Father        lung/ healthy  . Heart disease Mother        Deceased  . Cancer Mother        breast  . Hypertension Mother   . Cancer Maternal Grandmother        colon  . Diabetes Brother        type 2  . Heart disease Brother   . Pulmonary embolism Son        vasculiti  . Depression Paternal Grandmother    History  Sexual Activity  . 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     Outpatient Encounter Prescriptions as of 07/04/2017  Medication Sig  . aspirin 81 MG tablet Take 81 mg by mouth daily.  Marland Kitchen glipiZIDE (GLUCOTROL) 5 MG tablet 1 tab po in am and 1/2 tab po in pm  . glucose blood (ONETOUCH VERIO) test strip Use as directed three times daily to check blood sugar.  DX E11.8  . levothyroxine (SYNTHROID, LEVOTHROID) 100 MCG tablet TAKE 1 TABLET (100 MCG TOTAL) BY MOUTH DAILY BEFORE BREAKFAST.  . metFORMIN (GLUCOPHAGE-XR) 500 MG 24 hr tablet Take 2 tablets (1,000 mg total) by mouth 2 (two) times daily.  . ONE TOUCH LANCETS MISC Use as directed three times daily to check blood sugar.  DX E11.8  . rosuvastatin (CRESTOR) 40 MG tablet TAKE 1 TABLET (40 MG TOTAL) BY MOUTH DAILY.  . [DISCONTINUED] glipiZIDE (GLUCOTROL) 5 MG tablet TAKE 2 TABLETS (10 MG TOTAL) BY MOUTH 2 (TWO) TIMES DAILY BEFORE A MEAL.  . [DISCONTINUED] levothyroxine (SYNTHROID, LEVOTHROID) 100 MCG tablet TAKE 1 TABLET (100 MCG TOTAL) BY MOUTH DAILY BEFORE BREAKFAST.   No facility-administered encounter medications on file as of 07/04/2017.     Activities of Daily Living In your present state of health, do you have any difficulty performing the following activities: 07/04/2017  Hearing? N  Vision? N  Difficulty concentrating or making decisions? N  Walking or climbing stairs? N  Dressing or bathing? N  Doing errands, shopping? N  Preparing Food and eating ? N  Using the  Toilet? N  In the past six months, have you accidently leaked urine? N  Do you have problems with loss of bowel control? N  Managing your Medications? N  Managing your Finances? N  Housekeeping or managing your Housekeeping? N  Some recent data might be hidden    Patient Care Team: Mosie Lukes, MD as PCP - General (Family Medicine) Ashok Pall, MD as Consulting Physician (Neurosurgery) Stanford Breed Denice Bors, MD as Consulting Physician (Cardiology)   Assessment:    Physical assessment deferred to PCP.  Exercise Activities and Dietary recommendations Current Exercise Habits: Home exercise routine, Type of exercise: walking, Time (Minutes): 60, Frequency (Times/Week): 7,  Weekly Exercise (Minutes/Week): 420, Intensity: Mild   Diet (meal preparation, eat out, water intake, caffeinated beverages, dairy products, fruits and vegetables): in general, a "healthy" diet   Breakfast: cereal Lunch: sandwich Dinner:  Varies (meat and vegetables)  Goals      Patient Stated   . Maintain current healthy lifestyle. (pt-stated)      Fall Risk Fall Risk  07/04/2017 07/14/2016 07/14/2015  Falls in the past year? Yes No No  Number falls in past yr: 2 or more - -  Injury with Fall? No - -  Follow up Education provided;Falls prevention discussed - -   Depression Screen PHQ 2/9 Scores 07/04/2017 11/07/2016 07/18/2016 07/14/2015  PHQ - 2 Score 0 0 0 0    Cognitive Function MMSE - Mini Mental State Exam 07/04/2017  Orientation to time 5  Orientation to Place 5  Registration 3  Attention/ Calculation 5  Recall 3  Language- name 2 objects 2  Language- repeat 1  Language- follow 3 step command 3  Language- read & follow direction 1  Write a sentence 1  Copy design 1  Total score 30        Immunization History  Administered Date(s) Administered  . Influenza, High Dose Seasonal PF 08/30/2016  . Influenza,inj,Quad PF,36+ Mos 11/10/2014  . Influenza-Unspecified 11/28/2002, 10/21/2008,  09/25/2012  . Pneumococcal Polysaccharide-23 10/31/2013  . Pneumococcal-Unspecified 02/20/2006, 11/02/2012  . Td 06/10/1999  . Tdap 01/14/2011, 03/20/2014  . Zoster 01/14/2011   Screening Tests Health Maintenance  Topic Date Due  . HEMOGLOBIN A1C  02/27/2017  . URINE MICROALBUMIN  04/11/2017  . OPHTHALMOLOGY EXAM  06/08/2017  . PNA vac Low Risk Adult (2 of 2 - PCV13) 09/25/2017 (Originally 10/31/2014)  . INFLUENZA VACCINE  07/26/2017  . FOOT EXAM  09/16/2017  . COLONOSCOPY  03/20/2023  . TETANUS/TDAP  03/20/2024      Plan:   Follow up with PCP today as scheduled.  Continue to eat heart healthy diet (full of fruits, vegetables, whole grains, lean protein, water--limit salt, fat, and sugar intake) and increase physical activity as tolerated.  Continue doing brain stimulating activities (puzzles, reading, adult coloring books, staying active) to keep memory sharp.     I have personally reviewed and noted the following in the patient's chart:   . Medical and social history . Use of alcohol, tobacco or illicit drugs  . Current medications and supplements . Functional ability and status . Nutritional status . Physical activity . Advanced directives . List of other physicians . Hospitalizations, surgeries, and ER visits in previous 12 months . Vitals . Screenings to include cognitive, depression, and falls . Referrals and appointments  In addition, I have reviewed and discussed with patient certain preventive protocols, quality metrics, and best practice recommendations. A written personalized care plan for preventive services as well as general preventive health recommendations were provided to patient.     Shela Nevin, [place]  07/04/2017

## 2017-07-04 ENCOUNTER — Encounter: Payer: Self-pay | Admitting: Family Medicine

## 2017-07-04 ENCOUNTER — Ambulatory Visit (INDEPENDENT_AMBULATORY_CARE_PROVIDER_SITE_OTHER): Payer: Medicare Other | Admitting: Family Medicine

## 2017-07-04 VITALS — BP 106/58 | HR 82 | Ht 72.0 in | Wt 187.4 lb

## 2017-07-04 DIAGNOSIS — E7800 Pure hypercholesterolemia, unspecified: Secondary | ICD-10-CM

## 2017-07-04 DIAGNOSIS — Z0000 Encounter for general adult medical examination without abnormal findings: Secondary | ICD-10-CM | POA: Diagnosis not present

## 2017-07-04 DIAGNOSIS — E118 Type 2 diabetes mellitus with unspecified complications: Secondary | ICD-10-CM

## 2017-07-04 DIAGNOSIS — R42 Dizziness and giddiness: Secondary | ICD-10-CM | POA: Diagnosis not present

## 2017-07-04 DIAGNOSIS — E038 Other specified hypothyroidism: Secondary | ICD-10-CM

## 2017-07-04 DIAGNOSIS — R350 Frequency of micturition: Secondary | ICD-10-CM | POA: Diagnosis not present

## 2017-07-04 DIAGNOSIS — I10 Essential (primary) hypertension: Secondary | ICD-10-CM

## 2017-07-04 DIAGNOSIS — M545 Low back pain: Secondary | ICD-10-CM

## 2017-07-04 DIAGNOSIS — I25810 Atherosclerosis of coronary artery bypass graft(s) without angina pectoris: Secondary | ICD-10-CM | POA: Diagnosis not present

## 2017-07-04 DIAGNOSIS — G8929 Other chronic pain: Secondary | ICD-10-CM

## 2017-07-04 HISTORY — DX: Dizziness and giddiness: R42

## 2017-07-04 MED ORDER — GLIPIZIDE 5 MG PO TABS: 5.0000 mg | ORAL_TABLET | Freq: Every day | ORAL | 2 refills | Status: DC

## 2017-07-04 NOTE — Assessment & Plan Note (Signed)
Encouraged heart healthy diet, increase exercise, avoid trans fats, consider a krill oil cap daily. Tolerating Rosuvastatin 

## 2017-07-04 NOTE — Patient Instructions (Addendum)
Shingrix is the new shingles shot can get the 2 shot series at [facility] , Thank you for taking time to come for your Medicare Wellness Visit. I appreciate your ongoing commitment to your health goals. Please review the following plan we discussed and let me know if I can assist you in the future.  Try Sarna lotion for itching, witch hazel stringent helps itching also These are the goals we discussed: Goals      Patient Stated   . Maintain current healthy lifestyle. (pt-stated)       This is a list of the screening recommended for you and due dates:  Health Maintenance  Topic Date Due  . Hemoglobin A1C  02/27/2017  . Urine Protein Check  04/11/2017  . Eye exam for diabetics  06/08/2017  . Pneumonia vaccines (2 of 2 - PCV13) 09/25/2017*  . Flu Shot  07/26/2017  . Complete foot exam   09/16/2017  . Colon Cancer Screening  03/20/2023  . Tetanus Vaccine  03/20/2024  *Topic was postponed. The date shown is not the original due date.   Continue to eat heart healthy diet (full of fruits, vegetables, whole grains, lean protein, water--limit salt, fat, and sugar intake) and increase physical activity as tolerated.   Continue doing brain stimulating activities (puzzles, reading, adult coloring books, staying active) to keep memory sharp.    Health Maintenance, Male A healthy lifestyle and preventive care is important for your health and wellness. Ask your health care provider about what schedule of regular examinations is right for you. What should I know about weight and diet? Eat a Healthy Diet  Eat plenty of vegetables, fruits, whole grains, low-fat dairy products, and lean protein.  Do not eat a lot of foods high in solid fats, added sugars, or salt.  Maintain a Healthy Weight Regular exercise can help you achieve or maintain a healthy weight. You should:  Do at least 150 minutes of exercise each week. The exercise should increase your heart rate and make you sweat  (moderate-intensity exercise).  Do strength-training exercises at least twice a week.  Watch Your Levels of Cholesterol and Blood Lipids  Have your blood tested for lipids and cholesterol every 5 years starting at 75 years of age. If you are at high risk for heart disease, you should start having your blood tested when you are 75 years old. You may need to have your cholesterol levels checked more often if: ? Your lipid or cholesterol levels are high. ? You are older than 75 years of age. ? You are at high risk for heart disease.  What should I know about cancer screening? Many types of cancers can be detected early and may often be prevented. Lung Cancer  You should be screened every year for lung cancer if: ? You are a current smoker who has smoked for at least 30 years. ? You are a former smoker who has quit within the past 15 years.  Talk to your health care provider about your screening options, when you should start screening, and how often you should be screened.  Colorectal Cancer  Routine colorectal cancer screening usually begins at 75 years of age and should be repeated every 5-10 years until you are 75 years old. You may need to be screened more often if early forms of precancerous polyps or small growths are found. Your health care provider may recommend screening at an earlier age if you have risk factors for colon cancer.  Your health care provider may recommend using home test kits to check for hidden blood in the stool.  A small camera at the end of a tube can be used to examine your colon (sigmoidoscopy or colonoscopy). This checks for the earliest forms of colorectal cancer.  Prostate and Testicular Cancer  Depending on your age and overall health, your health care provider may do certain tests to screen for prostate and testicular cancer.  Talk to your health care provider about any symptoms or concerns you have about testicular or prostate cancer.  Skin  Cancer  Check your skin from head to toe regularly.  Tell your health care provider about any new moles or changes in moles, especially if: ? There is a change in a mole's size, shape, or color. ? You have a mole that is larger than a pencil eraser.  Always use sunscreen. Apply sunscreen liberally and repeat throughout the day.  Protect yourself by wearing long sleeves, pants, a wide-brimmed hat, and sunglasses when outside.  What should I know about heart disease, diabetes, and high blood pressure?  If you are 17-50 years of age, have your blood pressure checked every 3-5 years. If you are 15 years of age or older, have your blood pressure checked every year. You should have your blood pressure measured twice-once when you are at a hospital or clinic, and once when you are not at a hospital or clinic. Record the average of the two measurements. To check your blood pressure when you are not at a hospital or clinic, you can use: ? An automated blood pressure machine at a pharmacy. ? A home blood pressure monitor.  Talk to your health care provider about your target blood pressure.  If you are between 70-26 years old, ask your health care provider if you should take aspirin to prevent heart disease.  Have regular diabetes screenings by checking your fasting blood sugar level. ? If you are at a normal weight and have a low risk for diabetes, have this test once every three years after the age of 81. ? If you are overweight and have a high risk for diabetes, consider being tested at a younger age or more often.  A one-time screening for abdominal aortic aneurysm (AAA) by ultrasound is recommended for men aged 55-75 years who are current or former smokers. What should I know about preventing infection? Hepatitis B If you have a higher risk for hepatitis B, you should be screened for this virus. Talk with your health care provider to find out if you are at risk for hepatitis B  infection. Hepatitis C Blood testing is recommended for:  Everyone born from [birth year] through 1964.  Anyone with known risk factors for hepatitis C.  Sexually Transmitted Diseases (STDs)  You should be screened each year for STDs including gonorrhea and chlamydia if: ? You are sexually active and are younger than 75 years of age. ? You are older than 75 years of age and your health care provider tells you that you are at risk for this type of infection. ? Your sexual activity has changed since you were last screened and you are at an increased risk for chlamydia or gonorrhea. Ask your health care provider if you are at risk.  Talk with your health care provider about whether you are at high risk of being infected with HIV. Your health care provider may recommend a prescription medicine to help prevent HIV infection.  What else can I do?  Schedule regular health, dental, and eye exams.  Stay current with your vaccines (immunizations).  Do not use any tobacco products, such as cigarettes, chewing tobacco, and e-cigarettes. If you need help quitting, ask your health care provider.  Limit alcohol intake to no more than 2 drinks per day. One drink equals 12 ounces of beer, 5 ounces of wine, or 1 ounces of hard liquor.  Do not use street drugs.  Do not share needles.  Ask your health care provider for help if you need support or information about quitting drugs.  Tell your health care provider if you often feel depressed.  Tell your health care provider if you have ever been abused or do not feel safe at home. This information is not intended to replace advice given to you by your health care provider. Make sure you discuss any questions you have with your health care provider. Document Released: 06/09/2008 Document Revised: 08/10/2016 Document Reviewed: 09/15/2015 Elsevier Interactive Patient Education  [employer].

## 2017-07-04 NOTE — Assessment & Plan Note (Signed)
hgba1c acceptable, minimize simple carbs. Increase exercise as tolerated. Continue current meds 

## 2017-07-04 NOTE — Assessment & Plan Note (Signed)
Well controlled, no changes to meds. Encouraged heart healthy diet such as the DASH diet and exercise as tolerated.  °

## 2017-07-04 NOTE — Assessment & Plan Note (Signed)
On Levothyroxine, continue to monitor 

## 2017-07-05 LAB — HEMOGLOBIN A1C: Hgb A1c MFr Bld: 7.1 % — ABNORMAL HIGH (ref 4.6–6.5)

## 2017-07-05 LAB — COMPREHENSIVE METABOLIC PANEL WITH GFR
ALT: 12 U/L (ref 0–53)
AST: 22 U/L (ref 0–37)
Albumin: 4.6 g/dL (ref 3.5–5.2)
Alkaline Phosphatase: 77 U/L (ref 39–117)
BUN: 18 mg/dL (ref 6–23)
CO2: 26 meq/L (ref 19–32)
Calcium: 10.2 mg/dL (ref 8.4–10.5)
Chloride: 104 meq/L (ref 96–112)
Creatinine, Ser: 1.43 mg/dL (ref 0.40–1.50)
GFR: 51.3 mL/min — ABNORMAL LOW
Glucose, Bld: 149 mg/dL — ABNORMAL HIGH (ref 70–99)
Potassium: 4.4 meq/L (ref 3.5–5.1)
Sodium: 139 meq/L (ref 135–145)
Total Bilirubin: 0.5 mg/dL (ref 0.2–1.2)
Total Protein: 7.6 g/dL (ref 6.0–8.3)

## 2017-07-05 LAB — URINALYSIS, ROUTINE W REFLEX MICROSCOPIC
Bilirubin Urine: NEGATIVE
Ketones, ur: NEGATIVE
Leukocytes, UA: NEGATIVE
Nitrite: NEGATIVE
Specific Gravity, Urine: >=1.03 — AB (ref 1.000–1.030)
Total Protein, Urine: 30 — AB
Urine Glucose: NEGATIVE
Urobilinogen, UA: 0.2 (ref 0.0–1.0)
pH: 5.5 (ref 5.0–8.0)

## 2017-07-05 LAB — TSH: TSH: 0.38 u[IU]/mL (ref 0.35–4.50)

## 2017-07-05 LAB — CBC
HCT: 42.1 % (ref 39.0–52.0)
Hemoglobin: 14.1 g/dL (ref 13.0–17.0)
MCHC: 33.5 g/dL (ref 30.0–36.0)
MCV: 83 fl (ref 78.0–100.0)
Platelets: 196 K/uL (ref 150.0–400.0)
RBC: 5.07 Mil/uL (ref 4.22–5.81)
RDW: 14.4 % (ref 11.5–15.5)
WBC: 8 K/uL (ref 4.0–10.5)

## 2017-07-05 LAB — LIPID PANEL
Cholesterol: 85 mg/dL (ref 0–200)
HDL: 48.5 mg/dL
LDL Cholesterol: 8 mg/dL (ref 0–99)
NonHDL: 36.33
Total CHOL/HDL Ratio: 2
Triglycerides: 143 mg/dL (ref 0.0–149.0)
VLDL: 28.6 mg/dL (ref 0.0–40.0)

## 2017-07-05 NOTE — Assessment & Plan Note (Signed)
A couple of separate episodes separated by months. Usually with position changes. No actually spinning. He is encouraged to hydrate well, increase protein intake and frequency of intake. Do not arise too quickly and report if symptoms worsen

## 2017-07-05 NOTE — Progress Notes (Signed)
Patient ID: YUNIOR JAIN, male   DOB: 10-Feb-1941, 76 y.o.   MRN: [id number]   Subjective:    Patient ID: [name], male    DOB: [birth year], 76 y.o.   MRN: [id number]  Chief Complaint  Patient presents with  . Medicare Wellness    with RN    HPI Patient is in today for follow up and is noting a couple of separate episodes separated by months. Usually with position changes. No actually spinning. No head trauma or actual syncope. No vertigo. No recent febrile illness or hospitalization. Sugars and blood pressure have been well controlled. Lowest blood sugar was 66 and highest 156 average 120s. No polyuria or polydipsia. Denies CP/palp/SOB/HA/congestion/fevers/GI or GU c/o. Taking meds as prescribed  Past Medical History:  Diagnosis Date  . Aortic calcification ([place]) 02/09/2015  . Appendicitis   . Back pain 02/09/2015  . CAD (coronary artery disease)    2 stents  . Chicken pox   . Diabetes mellitus type 2 in obese ([place]) 04/11/2016  . Diabetes type 2, controlled ([place])   . Dysphagia, unspecified(787.20) 03/20/2014  . Hereditary and idiopathic peripheral neuropathy 02/09/2015  . Hyperlipidemia   . Hypertension   . Hypothyroidism   . Light headedness 07/04/2017  . Measles   . Medicare annual wellness visit, subsequent 06/14/2015  . Melanoma ([place])    Scalp. 2012  . Mumps as a child  . Thyroid disease    [place] Hyperthyroidism    Past Surgical History:  Procedure Laterality Date  . APPENDECTOMY    . BACK SURGERY     x2 lumbar  . BACK SURGERY     late 1990s had 2 surgeries, first surgery lifted a heavy engine ruptured disds, at L4 and L5, cleaned discs no hardware very helpful. 2 years later while recovering from angiogram with weights applied to left leg caused a recurrence and required surgery again in same area with disc repair  . CARDIAC CATHETERIZATION    . CARDIAC CATHETERIZATION N/A 05/11/2016   Procedure: Left Heart Cath and Coronary Angiography;  Surgeon: Belva Crome, MD;  Location: [facility];  Service: Cardiovascular;  Laterality: N/A;  . CARDIAC CATHETERIZATION N/A 05/11/2016   Procedure: Intravascular Pressure Wire/FFR Study;  Surgeon: Belva Crome, MD;  Location: [facility];  Service: Cardiovascular;  Laterality: N/A;  . CORONARY ANGIOPLASTY WITH STENT PLACEMENT     2 stents  . CORONARY ARTERY BYPASS GRAFT N/A 05/17/2016   Procedure: CORONARY ARTERY BYPASS GRAFTING (CABG) x 6 using left mammory artery and right greater saphenous vein harvested endoscopically. [place] to LAD, SVG to Diagonal, SVG Sequential to OM2 and ramus intermediate, SVG Sequential to RCA and PDA;  Surgeon: [name], MD;  Location: [facility];  Service: Open Heart Surgery;  Laterality: N/A;  . KNEE ARTHROSCOPY     right knee  . LUMBAR LAMINECTOMY/DECOMPRESSION MICRODISCECTOMY Bilateral 04/10/2015   Procedure: LUMBAR LAMINECTOMY/DECOMPRESSION MICRODISCECTOMY 2 LEVELS;  Surgeon: Ashok Pall, MD;  Location: [facility];  Service: Neurosurgery;  Laterality: Bilateral;  Bilateral L45 L5S1 Laminectomy and Foraminotomy  . ROTATOR CUFF REPAIR Right   . SKIN CANCER EXCISION     melanoma on scalp  . TEE WITHOUT CARDIOVERSION N/A 05/17/2016   Procedure: TRANSESOPHAGEAL ECHOCARDIOGRAM (TEE);  Surgeon: [name], MD;  Location: [facility];  Service: Open Heart Surgery;  Laterality: N/A;  . TONSILLECTOMY AND ADENOIDECTOMY    . WISDOM TOOTH EXTRACTION      Family History  Problem Relation Age of Onset  . Heart disease Father   . Emphysema Father   . Hypertension Father   . Cancer Father        lung/ healthy  . Heart disease Mother        Deceased  . Cancer Mother        breast  . Hypertension Mother   . Cancer Maternal Grandmother        colon  . Diabetes Brother        type 2  . Heart disease Brother   . Pulmonary embolism Son        vasculiti  . Depression Paternal Grandmother     Social History   Social History  . Marital status: Married     Spouse name: N/A  . Number of children: 2  . Years of education: N/A   Occupational History  . Not on file.   Social History Main Topics  . Smoking status: Former Smoker    Packs/day: 2.00    Years: 30.00    Types: Cigarettes    Start date: 12/27/1983  . Smokeless tobacco: Never Used  . Alcohol use 0.0 oz/week     Comment: occasionally  . Drug use: No  . 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    Other Topics Concern  . Not on file   Social History Narrative  . No narrative on file    Outpatient Medications Prior to Visit  Medication Sig Dispense Refill  . aspirin 81 MG tablet Take 81 mg by mouth daily.    Marland Kitchen glucose blood (ONETOUCH VERIO) test strip Use as directed three times daily to check blood sugar.  DX E11.8 300 each 3  . levothyroxine (SYNTHROID, LEVOTHROID) 100 MCG tablet TAKE 1 TABLET (100 MCG TOTAL) BY MOUTH DAILY BEFORE BREAKFAST. 90 tablet 1  . metFORMIN (GLUCOPHAGE-XR) 500 MG 24 hr tablet Take 2 tablets (1,000 mg total) by mouth 2 (two) times daily. 360 tablet 2  . ONE TOUCH LANCETS MISC Use as directed three times daily to check blood sugar.  DX E11.8 300 each 3  . rosuvastatin (CRESTOR) 40 MG tablet TAKE 1 TABLET (40 MG TOTAL) BY MOUTH DAILY. 90 tablet 2  . glipiZIDE (GLUCOTROL) 5 MG tablet 1 tab po in am and 1/2 tab po in pm 360 tablet 2  . glipiZIDE (GLUCOTROL) 5 MG tablet TAKE 2 TABLETS (10 MG TOTAL) BY MOUTH 2 (TWO) TIMES DAILY BEFORE A MEAL. 360 tablet 2  . levothyroxine (SYNTHROID, LEVOTHROID) 100 MCG tablet TAKE 1 TABLET (100 MCG TOTAL) BY MOUTH DAILY BEFORE BREAKFAST. 90 tablet 1   No facility-administered medications prior to visit.     No Known Allergies  Review of Systems  Constitutional: Negative for fever and malaise/fatigue.  HENT: Negative for congestion.   Eyes: Negative for blurred vision.  Respiratory: Negative for shortness of breath.   Cardiovascular: Negative for chest pain,  palpitations and leg swelling.  Gastrointestinal: Negative for abdominal pain, blood in stool and nausea.  Genitourinary: Negative for dysuria and frequency.  Musculoskeletal: Positive for falls.  Skin: Negative for rash.  Neurological: Positive for dizziness. Negative for loss of consciousness and headaches.  Endo/Heme/Allergies: Negative for environmental allergies.  Psychiatric/Behavioral: Negative for depression. The patient is not nervous/anxious.        Objective:    Physical Exam  Constitutional: He is oriented to person, place, and time. He appears well-developed and well-nourished.  No distress.  HENT:  Head: Normocephalic and atraumatic.  Nose: Nose normal.  Eyes: Right eye exhibits no discharge. Left eye exhibits no discharge.  Neck: Normal range of motion. Neck supple.  Cardiovascular: Normal rate and regular rhythm.   No murmur heard. Pulmonary/Chest: Effort normal and breath sounds normal.  Abdominal: Soft. Bowel sounds are normal. There is no tenderness.  Musculoskeletal: He exhibits no edema.  Neurological: He is alert and oriented to person, place, and time.  Skin: Skin is warm and dry.  Psychiatric: He has a normal mood and affect.  Nursing note and vitals reviewed.   BP (!) 106/58 (BP Location: Left Arm, Patient Position: Sitting, Cuff Size: Normal)   Pulse 82   Ht 6' (1.829 m)   Wt 187 lb 6.4 oz (85 kg)   SpO2 98%   BMI 25.42 kg/m  Wt Readings from Last 3 Encounters:  07/04/17 187 lb 6.4 oz (85 kg)  02/08/17 192 lb (87.1 kg)  01/19/17 194 lb 4 oz (88.1 kg)     Lab Results  Component Value Date   WBC 8.0 07/04/2017   HGB 14.1 07/04/2017   HCT 42.1 07/04/2017   PLT 196.0 07/04/2017   GLUCOSE 149 (H) 07/04/2017   CHOL 85 07/04/2017   TRIG 143.0 07/04/2017   HDL 48.50 07/04/2017   LDLDIRECT 43.0 04/11/2016   LDLCALC 8 07/04/2017   ALT 12 07/04/2017   AST 22 07/04/2017   NA 139 07/04/2017   K 4.4 07/04/2017   CL 104 07/04/2017   CREATININE  1.43 07/04/2017   BUN 18 07/04/2017   CO2 26 07/04/2017   TSH 0.38 07/04/2017   INR 1.61 (H) 05/17/2016   HGBA1C 7.1 (H) 07/04/2017   MICROALBUR <0.7 04/11/2016    Lab Results  Component Value Date   TSH 0.38 07/04/2017   Lab Results  Component Value Date   WBC 8.0 07/04/2017   HGB 14.1 07/04/2017   HCT 42.1 07/04/2017   MCV 83.0 07/04/2017   PLT 196.0 07/04/2017   Lab Results  Component Value Date   NA 139 07/04/2017   K 4.4 07/04/2017   CO2 26 07/04/2017   GLUCOSE 149 (H) 07/04/2017   BUN 18 07/04/2017   CREATININE 1.43 07/04/2017   BILITOT 0.5 07/04/2017   ALKPHOS 77 07/04/2017   AST 22 07/04/2017   ALT 12 07/04/2017   PROT 7.6 07/04/2017   ALBUMIN 4.6 07/04/2017   CALCIUM 10.2 07/04/2017   ANIONGAP 7 10/01/2016   GFR 51.30 (L) 07/04/2017   Lab Results  Component Value Date   CHOL 85 07/04/2017   Lab Results  Component Value Date   HDL 48.50 07/04/2017   Lab Results  Component Value Date   LDLCALC 8 07/04/2017   Lab Results  Component Value Date   TRIG 143.0 07/04/2017   Lab Results  Component Value Date   CHOLHDL 2 07/04/2017   Lab Results  Component Value Date   HGBA1C 7.1 (H) 07/04/2017       Assessment & Plan:   Problem List Items Addressed This Visit    Hyperlipidemia    Encouraged heart healthy diet, increase exercise, avoid trans fats, consider a krill oil cap daily. Tolerating Rosuvastatin      Relevant Orders   Lipid panel (Completed)   Hypothyroidism    On Levothyroxine, continue to monitor      HTN (hypertension)    Well controlled, no changes to meds. Encouraged heart healthy diet such as the DASH diet and  exercise as tolerated.       Relevant Orders   CBC (Completed)   TSH (Completed)   Comprehensive metabolic panel (Completed)   Back pain   Relevant Orders   Urine Culture   Urinalysis   Type 2 diabetes with complication (HCC)    [id number] acceptable, minimize simple carbs. Increase exercise as tolerated. Continue  current meds      Relevant Medications   glipiZIDE (GLUCOTROL) 5 MG tablet   Other Relevant Orders   Hemoglobin A1c (Completed)   Light headedness    A couple of separate episodes separated by months. Usually with position changes. No actually spinning. He is encouraged to hydrate well, increase protein intake and frequency of intake. Do not arise too quickly and report if symptoms worsen       Other Visit Diagnoses    Encounter for Medicare annual wellness exam    -  Primary   Urinary frequency       Relevant Orders   Urine Culture   Urinalysis      I have discontinued Mr. Jensen glipiZIDE. I have also changed his glipiZIDE. Additionally, I am having him maintain his aspirin, levothyroxine, metFORMIN, glucose blood, ONE TOUCH LANCETS, and rosuvastatin.  Meds ordered this encounter  Medications  . glipiZIDE (GLUCOTROL) 5 MG tablet    Sig: Take 1 tablet (5 mg total) by mouth daily before breakfast. 1 tab po in am and 1/2 tab po in pm    Dispense:  360 tablet    Refill:  2    D/C PREVIOUS SCRIPTS FOR THIS MEDICATION     Penni Homans, MD

## 2017-07-06 LAB — URINE CULTURE: Organism ID, Bacteria: NO GROWTH

## 2017-07-08 ENCOUNTER — Other Ambulatory Visit: Payer: Self-pay | Admitting: Physician Assistant

## 2017-07-10 ENCOUNTER — Other Ambulatory Visit: Payer: Self-pay | Admitting: Family Medicine

## 2017-07-14 DIAGNOSIS — H524 Presbyopia: Secondary | ICD-10-CM | POA: Diagnosis not present

## 2017-07-14 DIAGNOSIS — H2513 Age-related nuclear cataract, bilateral: Secondary | ICD-10-CM | POA: Diagnosis not present

## 2017-07-14 DIAGNOSIS — E119 Type 2 diabetes mellitus without complications: Secondary | ICD-10-CM | POA: Diagnosis not present

## 2017-07-14 LAB — HM DIABETES EYE EXAM

## 2017-07-25 ENCOUNTER — Encounter: Payer: Self-pay | Admitting: Family Medicine

## 2017-09-11 ENCOUNTER — Other Ambulatory Visit: Payer: Self-pay | Admitting: Family Medicine

## 2017-09-18 DIAGNOSIS — Z08 Encounter for follow-up examination after completed treatment for malignant neoplasm: Secondary | ICD-10-CM | POA: Diagnosis not present

## 2017-09-18 DIAGNOSIS — Z85820 Personal history of malignant melanoma of skin: Secondary | ICD-10-CM | POA: Diagnosis not present

## 2017-09-18 DIAGNOSIS — L570 Actinic keratosis: Secondary | ICD-10-CM | POA: Diagnosis not present

## 2017-09-23 DIAGNOSIS — Z23 Encounter for immunization: Secondary | ICD-10-CM | POA: Diagnosis not present

## 2017-10-04 DIAGNOSIS — M25562 Pain in left knee: Secondary | ICD-10-CM | POA: Diagnosis not present

## 2017-11-23 ENCOUNTER — Other Ambulatory Visit: Payer: Self-pay | Admitting: Family Medicine

## 2017-12-01 ENCOUNTER — Ambulatory Visit: Payer: Self-pay

## 2017-12-01 NOTE — Telephone Encounter (Signed)
  Reason for Disposition . Last foot examination > 1 year ago  Answer Assessment - Initial Assessment Questions 1. SYMPTOM: "What's the main symptom you're concerned about?" (e.g., rash, sore, callus, drainage, numbness)     3 toenails have fallen off 2. LOCATION: "Where is the  _______ located?" (e.g., foot/toe, top/bottom, left/right)     Both feet 3. ONSET: "When did the  ________  start?"     Months 4. PAIN: "Is there any pain?" If so, ask: "How bad is it?" (Scale: 1-10; mild, moderate, severe)     No pain with this 5. CAUSE: "What do you think is causing the symptoms?"     Unsure 6. OTHER SYMPTOMS: "Do you have any other symptoms?" (e.g., fever, weakness)     No  7. PREGNANCY: "Is there any chance you are pregnant?" "When was your last menstrual period?"     No  Protocols used: DIABETES - FOOT PROBLEMS AND QUESTIONS-A-[facility] Reports no redness or other signs of infection." It's not an emergency." Appointment made for next week.

## 2017-12-07 ENCOUNTER — Ambulatory Visit (INDEPENDENT_AMBULATORY_CARE_PROVIDER_SITE_OTHER): Payer: Medicare Other | Admitting: Family Medicine

## 2017-12-07 ENCOUNTER — Ambulatory Visit: Payer: Medicare Other | Admitting: Family Medicine

## 2017-12-07 VITALS — BP 110/62 | HR 67 | Temp 97.6°F | Resp 18 | Wt 193.6 lb

## 2017-12-07 DIAGNOSIS — I25810 Atherosclerosis of coronary artery bypass graft(s) without angina pectoris: Secondary | ICD-10-CM

## 2017-12-07 DIAGNOSIS — L603 Nail dystrophy: Secondary | ICD-10-CM | POA: Diagnosis not present

## 2017-12-07 DIAGNOSIS — M79672 Pain in left foot: Secondary | ICD-10-CM | POA: Diagnosis not present

## 2017-12-07 DIAGNOSIS — E038 Other specified hypothyroidism: Secondary | ICD-10-CM | POA: Diagnosis not present

## 2017-12-07 DIAGNOSIS — E7800 Pure hypercholesterolemia, unspecified: Secondary | ICD-10-CM | POA: Diagnosis not present

## 2017-12-07 DIAGNOSIS — M5126 Other intervertebral disc displacement, lumbar region: Secondary | ICD-10-CM | POA: Diagnosis not present

## 2017-12-07 DIAGNOSIS — E118 Type 2 diabetes mellitus with unspecified complications: Secondary | ICD-10-CM | POA: Diagnosis not present

## 2017-12-07 DIAGNOSIS — I10 Essential (primary) hypertension: Secondary | ICD-10-CM

## 2017-12-07 LAB — COMPREHENSIVE METABOLIC PANEL
ALT: 13 U/L (ref 0–53)
AST: 20 U/L (ref 0–37)
Albumin: 4.3 g/dL (ref 3.5–5.2)
Alkaline Phosphatase: 58 U/L (ref 39–117)
BUN: 17 mg/dL (ref 6–23)
CO2: 29 mEq/L (ref 19–32)
Calcium: 9.8 mg/dL (ref 8.4–10.5)
Chloride: 106 mEq/L (ref 96–112)
Creatinine, Ser: 1.48 mg/dL (ref 0.40–1.50)
GFR: 49.24 mL/min — ABNORMAL LOW (ref >60.00)
Glucose, Bld: 177 mg/dL — ABNORMAL HIGH (ref 70–99)
Potassium: 5.1 mEq/L (ref 3.5–5.1)
Sodium: 140 mEq/L (ref 135–145)
Total Bilirubin: 0.5 mg/dL (ref 0.2–1.2)
Total Protein: 7.1 g/dL (ref 6.0–8.3)

## 2017-12-07 LAB — CBC
HCT: 43.1 % (ref 39.0–52.0)
Hemoglobin: 14.2 g/dL (ref 13.0–17.0)
MCHC: 33 g/dL (ref 30.0–36.0)
MCV: 85.1 fl (ref 78.0–100.0)
Platelets: 197 10*3/uL (ref 150.0–400.0)
RBC: 5.07 Mil/uL (ref 4.22–5.81)
RDW: 13.8 % (ref 11.5–15.5)
WBC: 5.7 10*3/uL (ref 4.0–10.5)

## 2017-12-07 LAB — LIPID PANEL
Cholesterol: 75 mg/dL (ref 0–200)
HDL: 41.5 mg/dL (ref >39.00)
LDL Cholesterol: 14 mg/dL (ref 0–99)
NonHDL: 33.4
Total CHOL/HDL Ratio: 2
Triglycerides: 99 mg/dL (ref 0.0–149.0)
VLDL: 19.8 mg/dL (ref 0.0–40.0)

## 2017-12-07 LAB — TSH: TSH: 0.68 u[IU]/mL (ref 0.35–4.50)

## 2017-12-07 LAB — MICROALBUMIN / CREATININE URINE RATIO
Creatinine,U: 173.9 mg/dL
Microalb Creat Ratio: 6.5 mg/g (ref 0.0–30.0)
Microalb, Ur: 11.3 mg/dL — ABNORMAL HIGH (ref 0.0–1.9)

## 2017-12-07 LAB — HEMOGLOBIN A1C: Hgb A1c MFr Bld: 7.3 % — ABNORMAL HIGH (ref 4.6–6.5)

## 2017-12-07 NOTE — Assessment & Plan Note (Signed)
With great toenails coming off at times. Referred to podiatry for further consideration

## 2017-12-07 NOTE — Progress Notes (Signed)
Subjective:  I acted as a Education administrator for Dr. Charlett Blake. [name], [place]  Patient ID: Jack Wade, male    DOB: 11/16/1941, 76 y.o.   MRN: [id number]  No chief complaint on file.   HPI  Patient is in today for a follow up visit. Patient c/o his toe nails falling off on both feet  Toenails are thickened. He is noting increased weakness and pain in left foot. No recent fall and injury but he injured his low back years ago and has had these symptoms since. His pain is escalating along medial aspect of left foot recently. No warmth or redness. No polyuria or polydipsia. No recent febrile illness, hospitalization or acute concerns. Denies CP/palp/SOB/HA/congestion/fevers/GI or GU c/o. Taking meds as prescribed.   Patient Care Team: Mosie Lukes, MD as PCP - General (Family Medicine) Ashok Pall, MD as Consulting Physician (Neurosurgery) Stanford Breed Denice Bors, MD as Consulting Physician (Cardiology)   Past Medical History:  Diagnosis Date  . Aortic calcification ([place]) 02/09/2015  . Appendicitis   . Back pain 02/09/2015  . CAD (coronary artery disease)    2 stents  . Chicken pox   . Diabetes mellitus type 2 in obese ([place]) 04/11/2016  . Diabetes type 2, controlled ([place])   . Dysphagia, unspecified(787.20) 03/20/2014  . Hereditary and idiopathic peripheral neuropathy 02/09/2015  . Hyperlipidemia   . Hypertension   . Hypothyroidism   . Light headedness 07/04/2017  . Measles   . Medicare annual wellness visit, subsequent 06/14/2015  . Melanoma ([place])    Scalp. 2012  . Mumps as a child  . Thyroid disease    [place] Hyperthyroidism    Past Surgical History:  Procedure Laterality Date  . APPENDECTOMY    . BACK SURGERY     x2 lumbar  . BACK SURGERY     late 1990s had 2 surgeries, first surgery lifted a heavy engine ruptured disds, at L4 and L5, cleaned discs no hardware very helpful. 2 years later while recovering from angiogram with weights applied to left leg caused a recurrence and required  surgery again in same area with disc repair  . CARDIAC CATHETERIZATION    . CARDIAC CATHETERIZATION N/A 05/11/2016   Procedure: Left Heart Cath and Coronary Angiography;  Surgeon: Belva Crome, MD;  Location: [facility];  Service: Cardiovascular;  Laterality: N/A;  . CARDIAC CATHETERIZATION N/A 05/11/2016   Procedure: Intravascular Pressure Wire/FFR Study;  Surgeon: Belva Crome, MD;  Location: [facility];  Service: Cardiovascular;  Laterality: N/A;  . CORONARY ANGIOPLASTY WITH STENT PLACEMENT     2 stents  . CORONARY ARTERY BYPASS GRAFT N/A 05/17/2016   Procedure: CORONARY ARTERY BYPASS GRAFTING (CABG) x 6 using left mammory artery and right greater saphenous vein harvested endoscopically. [place] to LAD, SVG to Diagonal, SVG Sequential to OM2 and ramus intermediate, SVG Sequential to RCA and PDA;  Surgeon: [name], MD;  Location: [facility];  Service: Open Heart Surgery;  Laterality: N/A;  . KNEE ARTHROSCOPY     right knee  . LUMBAR LAMINECTOMY/DECOMPRESSION MICRODISCECTOMY Bilateral 04/10/2015   Procedure: LUMBAR LAMINECTOMY/DECOMPRESSION MICRODISCECTOMY 2 LEVELS;  Surgeon: Ashok Pall, MD;  Location: [facility];  Service: Neurosurgery;  Laterality: Bilateral;  Bilateral L45 L5S1 Laminectomy and Foraminotomy  . ROTATOR CUFF REPAIR Right   . SKIN CANCER EXCISION     melanoma on scalp  . TEE WITHOUT CARDIOVERSION N/A 05/17/2016   Procedure: TRANSESOPHAGEAL ECHOCARDIOGRAM (TEE);  Surgeon: [name], MD;  Location: [facility];  Service: Open Heart Surgery;  Laterality: N/A;  . TONSILLECTOMY AND ADENOIDECTOMY    . WISDOM TOOTH EXTRACTION      Family History  Problem Relation Age of Onset  . Heart disease Father   . Emphysema Father   . Hypertension Father   . Cancer Father        lung/ healthy  . Heart disease Mother        Deceased  . Cancer Mother        breast  . Hypertension Mother   . Cancer Maternal Grandmother        colon  . Diabetes Brother         type 2  . Heart disease Brother   . Pulmonary embolism Son        vasculiti  . Depression Paternal Grandmother     Social History   Socioeconomic History  . Marital status: Married    Spouse name: Not on file  . Number of children: 2  . Years of education: Not on file  . Highest education level: Not on file  Social Needs  . Financial resource strain: Not on file  . Food insecurity - worry: Not on file  . Food insecurity - inability: Not on file  . Transportation needs - medical: Not on file  . Transportation needs - non-medical: Not on file  Occupational History  . Not on file  Tobacco Use  . Smoking status: Former Smoker    Packs/day: 2.00    Years: 30.00    Pack years: 60.00    Types: Cigarettes    Start date: 12/27/1983  . Smokeless tobacco: Never Used  Substance and Sexual Activity  . Alcohol use: Yes    Alcohol/week: 0.0 oz    Comment: occasionally  . Drug use: No  . 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   Other Topics Concern  . Not on file  Social History Narrative  . Not on file    Outpatient Medications Prior to Visit  Medication Sig Dispense Refill  . aspirin 81 MG tablet Take 81 mg by mouth daily.    Marland Kitchen glipiZIDE (GLUCOTROL) 5 MG tablet Take 1 tablet (5 mg total) by mouth daily before breakfast. 1 tab po in am and 1/2 tab po in pm 360 tablet 2  . glucose blood (ONETOUCH VERIO) test strip Use as directed three times daily to check blood sugar.  DX E11.8 300 each 3  . levothyroxine (SYNTHROID, LEVOTHROID) 100 MCG tablet TAKE 1 TABLET (100 MCG TOTAL) BY MOUTH DAILY BEFORE BREAKFAST. 90 tablet 1  . metFORMIN (GLUCOPHAGE-XR) 500 MG 24 hr tablet TAKE 2 TABLETS BY MOUTH TWICE A DAY 360 tablet 1  . ONE TOUCH LANCETS MISC Use as directed three times daily to check blood sugar.  DX E11.8 300 each 3  . ONETOUCH VERIO test strip USE AS DIRECTED ONCE DAILY TO CHECK BLOOD SUGAR. DX E11.8 100 each 2  .  rosuvastatin (CRESTOR) 40 MG tablet TAKE 1 TABLET (40 MG TOTAL) BY MOUTH DAILY. 90 tablet 2  . levothyroxine (SYNTHROID, LEVOTHROID) 100 MCG tablet TAKE 1 TABLET (100 MCG TOTAL) BY MOUTH DAILY BEFORE BREAKFAST. 90 tablet 1   No facility-administered medications prior to visit.     No Known Allergies  Review of Systems  Constitutional: Negative for fever and malaise/fatigue.  HENT: Negative for congestion.   Eyes: Negative for blurred vision.  Respiratory: Negative  for shortness of breath.   Cardiovascular: Negative for chest pain, palpitations and leg swelling.  Gastrointestinal: Negative for abdominal pain, blood in stool and nausea.  Genitourinary: Negative for dysuria and frequency.  Musculoskeletal: Positive for joint pain. Negative for falls.  Skin: Negative for rash.  Neurological: Positive for focal weakness. Negative for dizziness, loss of consciousness and headaches.  Endo/Heme/Allergies: Negative for environmental allergies.  Psychiatric/Behavioral: Negative for depression. The patient is not nervous/anxious.        Objective:    Physical Exam  Constitutional: He is oriented to person, place, and time. He appears well-developed and well-nourished. No distress.  HENT:  Head: Normocephalic and atraumatic.  Nose: Nose normal.  Eyes: Right eye exhibits no discharge. Left eye exhibits no discharge.  Neck: Normal range of motion. Neck supple.  Cardiovascular: Normal rate and regular rhythm.  No murmur heard. Pulmonary/Chest: Effort normal and breath sounds normal.  Abdominal: Soft. Bowel sounds are normal. There is no tenderness.  Musculoskeletal: He exhibits no edema.  Neurological: He is alert and oriented to person, place, and time.  Skin: Skin is warm and dry.  Psychiatric: He has a normal mood and affect.  Nursing note and vitals reviewed.   BP 110/62 (BP Location: Left Arm, Patient Position: Sitting, Cuff Size: Normal)   Pulse 67   Temp 97.6 F (36.4 C) (Oral)    Resp 18   Wt 193 lb 9.6 oz (87.8 kg)   SpO2 98%   BMI 26.26 kg/m  Wt Readings from Last 3 Encounters:  12/07/17 193 lb 9.6 oz (87.8 kg)  07/04/17 187 lb 6.4 oz (85 kg)  02/08/17 192 lb (87.1 kg)   BP Readings from Last 3 Encounters:  12/07/17 110/62  07/04/17 (!) 106/58  02/08/17 111/67     Immunization History  Administered Date(s) Administered  . Influenza, High Dose Seasonal PF 08/30/2016, 09/23/2017  . Influenza,inj,Quad PF,6+ Mos 11/10/2014  . Influenza-Unspecified 11/28/2002, 10/21/2008, 09/25/2012  . Pneumococcal Polysaccharide-23 10/31/2013  . Pneumococcal-Unspecified 02/20/2006, 11/02/2012  . Td 06/10/1999  . Tdap 01/14/2011, 03/20/2014  . Zoster 01/14/2011    Health Maintenance  Topic Date Due  . PNA vac Low Risk Adult (2 of 2 - PCV13) 10/31/2014  . URINE MICROALBUMIN  04/11/2017  . FOOT EXAM  09/16/2017  . HEMOGLOBIN A1C  01/04/2018  . OPHTHALMOLOGY EXAM  07/14/2018  . COLONOSCOPY  03/20/2023  . TETANUS/TDAP  03/20/2024  . INFLUENZA VACCINE  Completed    Lab Results  Component Value Date   WBC 5.7 12/07/2017   HGB 14.2 12/07/2017   HCT 43.1 12/07/2017   PLT 197.0 12/07/2017   GLUCOSE 177 (H) 12/07/2017   CHOL 75 12/07/2017   TRIG 99.0 12/07/2017   HDL 41.50 12/07/2017   LDLDIRECT 43.0 04/11/2016   LDLCALC 14 12/07/2017   ALT 13 12/07/2017   AST 20 12/07/2017   NA 140 12/07/2017   K 5.1 12/07/2017   CL 106 12/07/2017   CREATININE 1.48 12/07/2017   BUN 17 12/07/2017   CO2 29 12/07/2017   TSH 0.68 12/07/2017   INR 1.61 (H) 05/17/2016   HGBA1C 7.3 (H) 12/07/2017   MICROALBUR 11.3 (H) 12/07/2017    Lab Results  Component Value Date   TSH 0.68 12/07/2017   Lab Results  Component Value Date   WBC 5.7 12/07/2017   HGB 14.2 12/07/2017   HCT 43.1 12/07/2017   MCV 85.1 12/07/2017   PLT 197.0 12/07/2017   Lab Results  Component Value Date   NA  140 12/07/2017   K 5.1 12/07/2017   CO2 29 12/07/2017   GLUCOSE 177 (H) 12/07/2017   BUN  17 12/07/2017   CREATININE 1.48 12/07/2017   BILITOT 0.5 12/07/2017   ALKPHOS 58 12/07/2017   AST 20 12/07/2017   ALT 13 12/07/2017   PROT 7.1 12/07/2017   ALBUMIN 4.3 12/07/2017   CALCIUM 9.8 12/07/2017   ANIONGAP 7 10/01/2016   GFR 49.24 (L) 12/07/2017   Lab Results  Component Value Date   CHOL 75 12/07/2017   Lab Results  Component Value Date   HDL 41.50 12/07/2017   Lab Results  Component Value Date   LDLCALC 14 12/07/2017   Lab Results  Component Value Date   TRIG 99.0 12/07/2017   Lab Results  Component Value Date   CHOLHDL 2 12/07/2017   Lab Results  Component Value Date   HGBA1C 7.3 (H) 12/07/2017         Assessment & Plan:   Problem List Items Addressed This Visit    Hyperlipidemia    Tolerating statin, encouraged heart healthy diet, avoid trans fats, minimize simple carbs and saturated fats. Increase exercise as tolerated      Relevant Orders   Lipid panel (Completed)   Hypothyroidism    On Levothyroxine, continue to monitor      HTN (hypertension) - Primary    Well controlled, no changes to meds. Encouraged heart healthy diet such as the DASH diet and exercise as tolerated.       Relevant Orders   CBC (Completed)   Comprehensive metabolic panel (Completed)   TSH (Completed)   HNP (herniated nucleus pulposus), lumbar    Has dealt with increased debility and weaknessin left foot and leg since the injury years ago but now Is having increasing pain in medial, caudal aspect of left foot. Will refer to podiatry for further consideration.      Type 2 diabetes with complication (HCC)    RNHA5B acceptable, minimize simple carbs. Increase exercise as tolerated. Continue current meds      Relevant Orders   Hemoglobin A1c (Completed)   Microalbumin / creatinine urine ratio (Completed)   Left foot pain    Consider lidocaine gel as needed, xray and consider Gabapentin if worsens      Relevant Orders   Ambulatory referral to Podiatry    Dystrophic nail    With great toenails coming off at times. Referred to podiatry for further consideration         I am having Jack Wade. Jack Wade maintain his aspirin, glucose blood, ONE TOUCH LANCETS, rosuvastatin, glipiZIDE, metFORMIN, ONETOUCH VERIO, and levothyroxine.  No orders of the defined types were placed in this encounter.   CMA served as Education administrator during this visit. History, Physical and Plan performed by medical provider. Documentation and orders reviewed and attested to.  Penni Homans, MD

## 2017-12-07 NOTE — Patient Instructions (Addendum)
Shingrix new shingles shot over 2-6 months at pharmacy  Carbohydrate Counting for Diabetes Mellitus, Adult Carbohydrate counting is a method for keeping track of how many carbohydrates you eat. Eating carbohydrates naturally increases the amount of sugar (glucose) in the blood. Counting how many carbohydrates you eat helps keep your blood glucose within normal limits, which helps you manage your diabetes (diabetes mellitus). It is important to know how many carbohydrates you can safely have in each meal. This is different for every person. A diet and nutrition specialist (registered dietitian) can help you make a meal plan and calculate how many carbohydrates you should have at each meal and snack. Carbohydrates are found in the following foods:  Grains, such as breads and cereals.  Dried beans and soy products.  Starchy vegetables, such as potatoes, peas, and corn.  Fruit and fruit juices.  Milk and yogurt.  Sweets and snack foods, such as cake, cookies, candy, chips, and soft drinks.  How do I count carbohydrates? There are two ways to count carbohydrates in food. You can use either of the methods or a combination of both. Reading "Nutrition Facts" on packaged food The "Nutrition Facts" list is included on the labels of almost all packaged foods and beverages in the [place] It includes:  The serving size.  Information about nutrients in each serving, including the grams (g) of carbohydrate per serving.  To use the "Nutrition Facts":  Decide how many servings you will have.  Multiply the number of servings by the number of carbohydrates per serving.  The resulting number is the total amount of carbohydrates that you will be having.  Learning standard serving sizes of other foods When you eat foods containing carbohydrates that are not packaged or do not include "Nutrition Facts" on the label, you need to measure the servings in order to count the amount of carbohydrates:  Measure  the foods that you will eat with a food scale or measuring cup, if needed.  Decide how many standard-size servings you will eat.  Multiply the number of servings by 15. Most carbohydrate-rich foods have about 15 g of carbohydrates per serving. ? For example, if you eat 8 oz (170 g) of strawberries, you will have eaten 2 servings and 30 g of carbohydrates (2 servings x 15 g = 30 g).  For foods that have more than one food mixed, such as soups and casseroles, you must count the carbohydrates in each food that is included.  The following list contains standard serving sizes of common carbohydrate-rich foods. Each of these servings has about 15 g of carbohydrates:   hamburger bun or  English muffin.   oz (15 mL) syrup.   oz (14 g) jelly.  1 slice of bread.  1 six-inch tortilla.  3 oz (85 g) cooked rice or pasta.  4 oz (113 g) cooked dried beans.  4 oz (113 g) starchy vegetable, such as peas, corn, or potatoes.  4 oz (113 g) hot cereal.  4 oz (113 g) mashed potatoes or  of a large baked potato.  4 oz (113 g) canned or frozen fruit.  4 oz (120 mL) fruit juice.  4-6 crackers.  6 chicken nuggets.  6 oz (170 g) unsweetened dry cereal.  6 oz (170 g) plain fat-free yogurt or yogurt sweetened with artificial sweeteners.  8 oz (240 mL) milk.  8 oz (170 g) fresh fruit or one small piece of fruit.  24 oz (680 g) popped popcorn.  Example of carbohydrate  counting Sample meal  3 oz (85 g) chicken breast.  6 oz (170 g) brown rice.  4 oz (113 g) corn.  8 oz (240 mL) milk.  8 oz (170 g) strawberries with sugar-free whipped topping. Carbohydrate calculation 1. Identify the foods that contain carbohydrates: ? Rice. ? Corn. ? Milk. ? Strawberries. 2. Calculate how many servings you have of each food: ? 2 servings rice. ? 1 serving corn. ? 1 serving milk. ? 1 serving strawberries. 3. Multiply each number of servings by 15 g: ? 2 servings rice x 15 g = 30 g. ? 1  serving corn x 15 g = 15 g. ? 1 serving milk x 15 g = 15 g. ? 1 serving strawberries x 15 g = 15 g. 4. Add together all of the amounts to find the total grams of carbohydrates eaten: ? 30 g + 15 g + 15 g + 15 g = 75 g of carbohydrates total. This information is not intended to replace advice given to you by your health care provider. Make sure you discuss any questions you have with your health care provider. Document Released: 12/12/2005 Document Revised: 07/01/2016 Document Reviewed: 05/25/2016 Elsevier Interactive Patient Education  [employer].

## 2017-12-07 NOTE — Assessment & Plan Note (Signed)
Has dealt with increased debility and weaknessin left foot and leg since the injury years ago but now Is having increasing pain in medial, caudal aspect of left foot. Will refer to podiatry for further consideration.

## 2017-12-07 NOTE — Assessment & Plan Note (Signed)
Consider lidocaine gel as needed, xray and consider Gabapentin if worsens

## 2017-12-07 NOTE — Assessment & Plan Note (Signed)
On Levothyroxine, continue to monitor 

## 2017-12-07 NOTE — Assessment & Plan Note (Signed)
Tolerating statin, encouraged heart healthy diet, avoid trans fats, minimize simple carbs and saturated fats. Increase exercise as tolerated 

## 2017-12-07 NOTE — Assessment & Plan Note (Signed)
Well controlled, no changes to meds. Encouraged heart healthy diet such as the DASH diet and exercise as tolerated.  °

## 2017-12-07 NOTE — Assessment & Plan Note (Signed)
hgba1c acceptable, minimize simple carbs. Increase exercise as tolerated. Continue current meds 

## 2017-12-08 ENCOUNTER — Encounter: Payer: Self-pay | Admitting: Family Medicine

## 2017-12-08 MED ORDER — ENALAPRIL MALEATE 2.5 MG PO TABS: 2.5000 mg | ORAL_TABLET | Freq: Every day | ORAL | 0 refills | Status: DC

## 2017-12-08 NOTE — Addendum Note (Signed)
Addended by: Magdalene Molly A on: 12/08/2017 09:11 AM   Modules accepted: Orders

## 2017-12-25 IMAGING — CR DG CHEST 2V
2 series · 2 of 2 positions shown · non-contrast
Comparison: 05/19/2016.

CLINICAL DATA: CABG.

EXAM:
CHEST  2 VIEW

[chest lat]
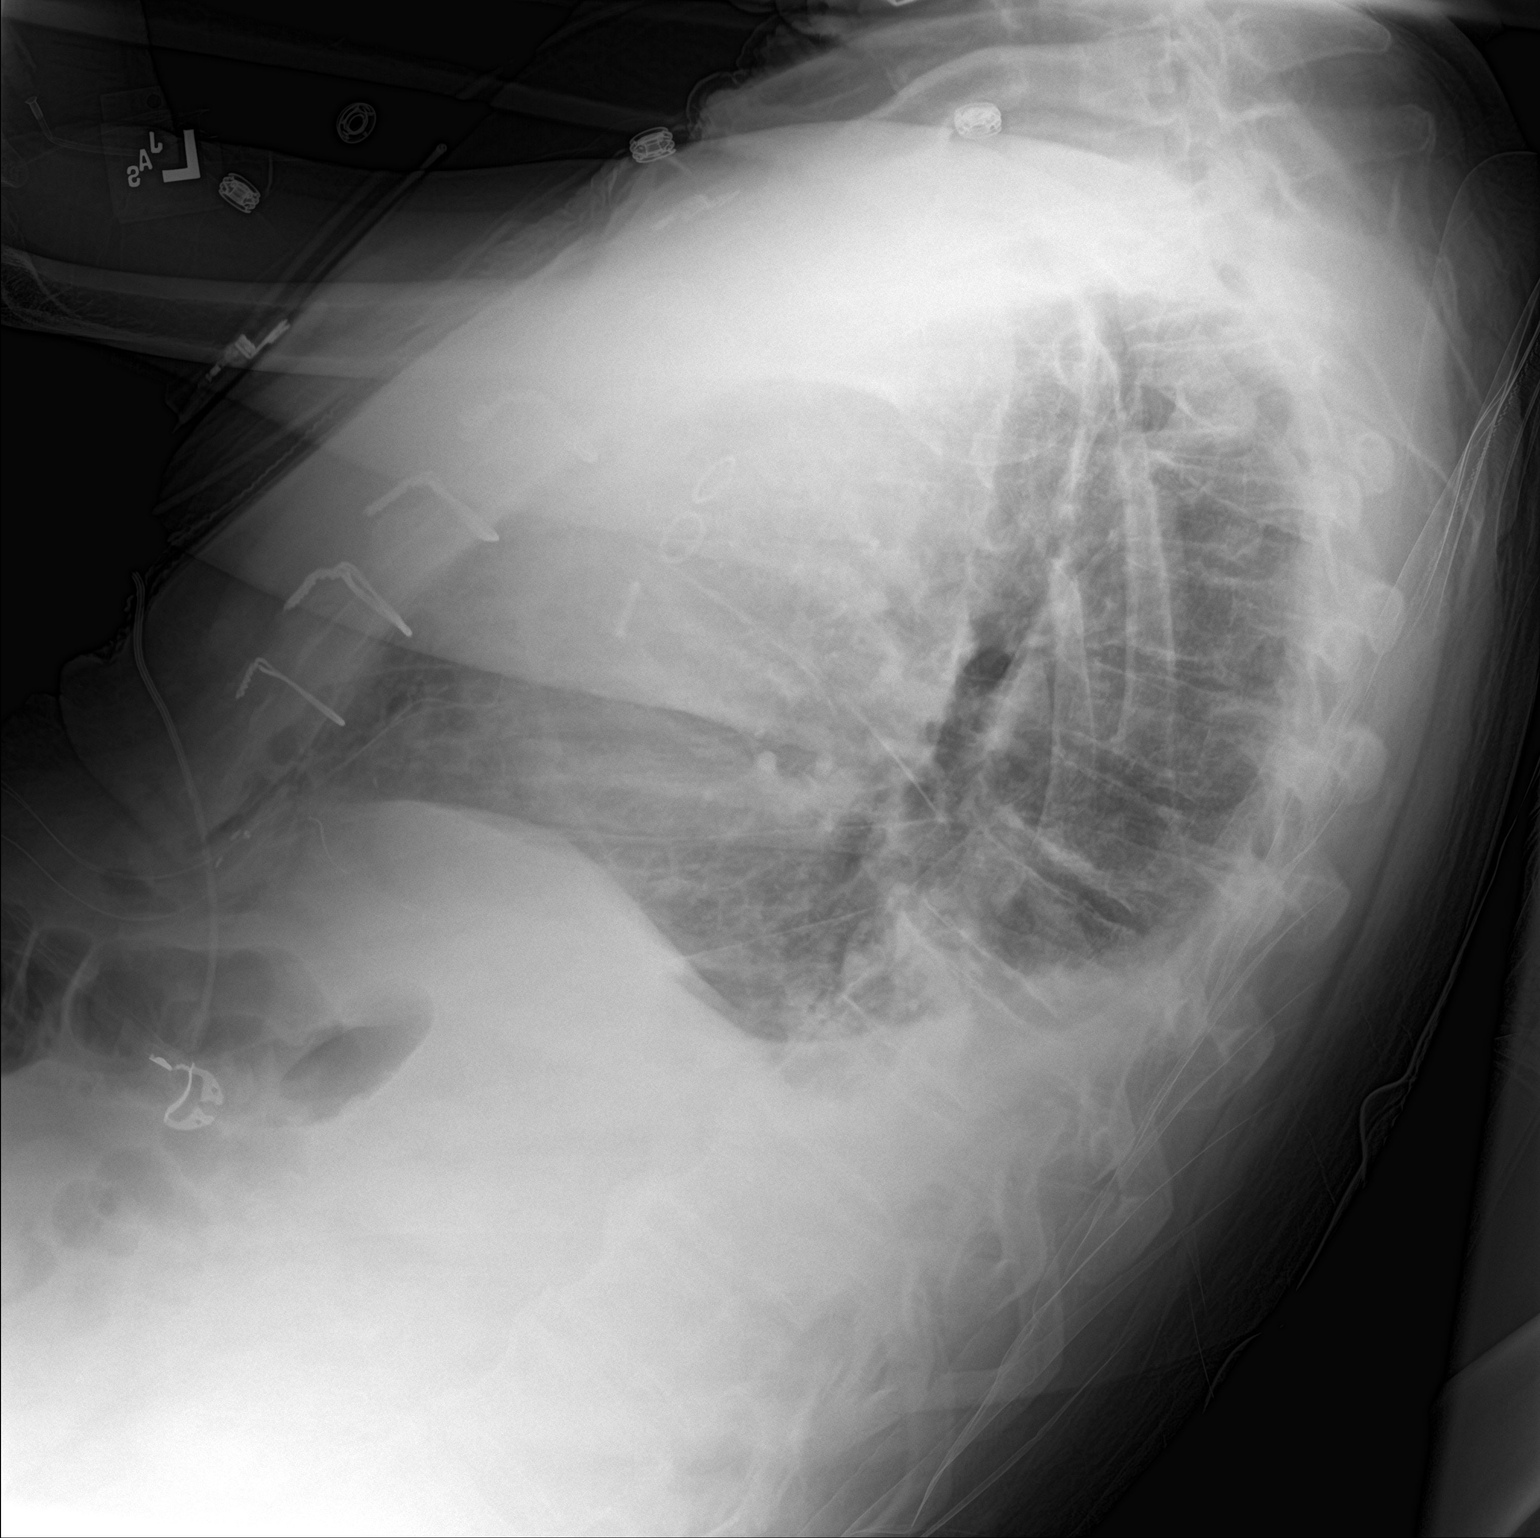

[chest ap]
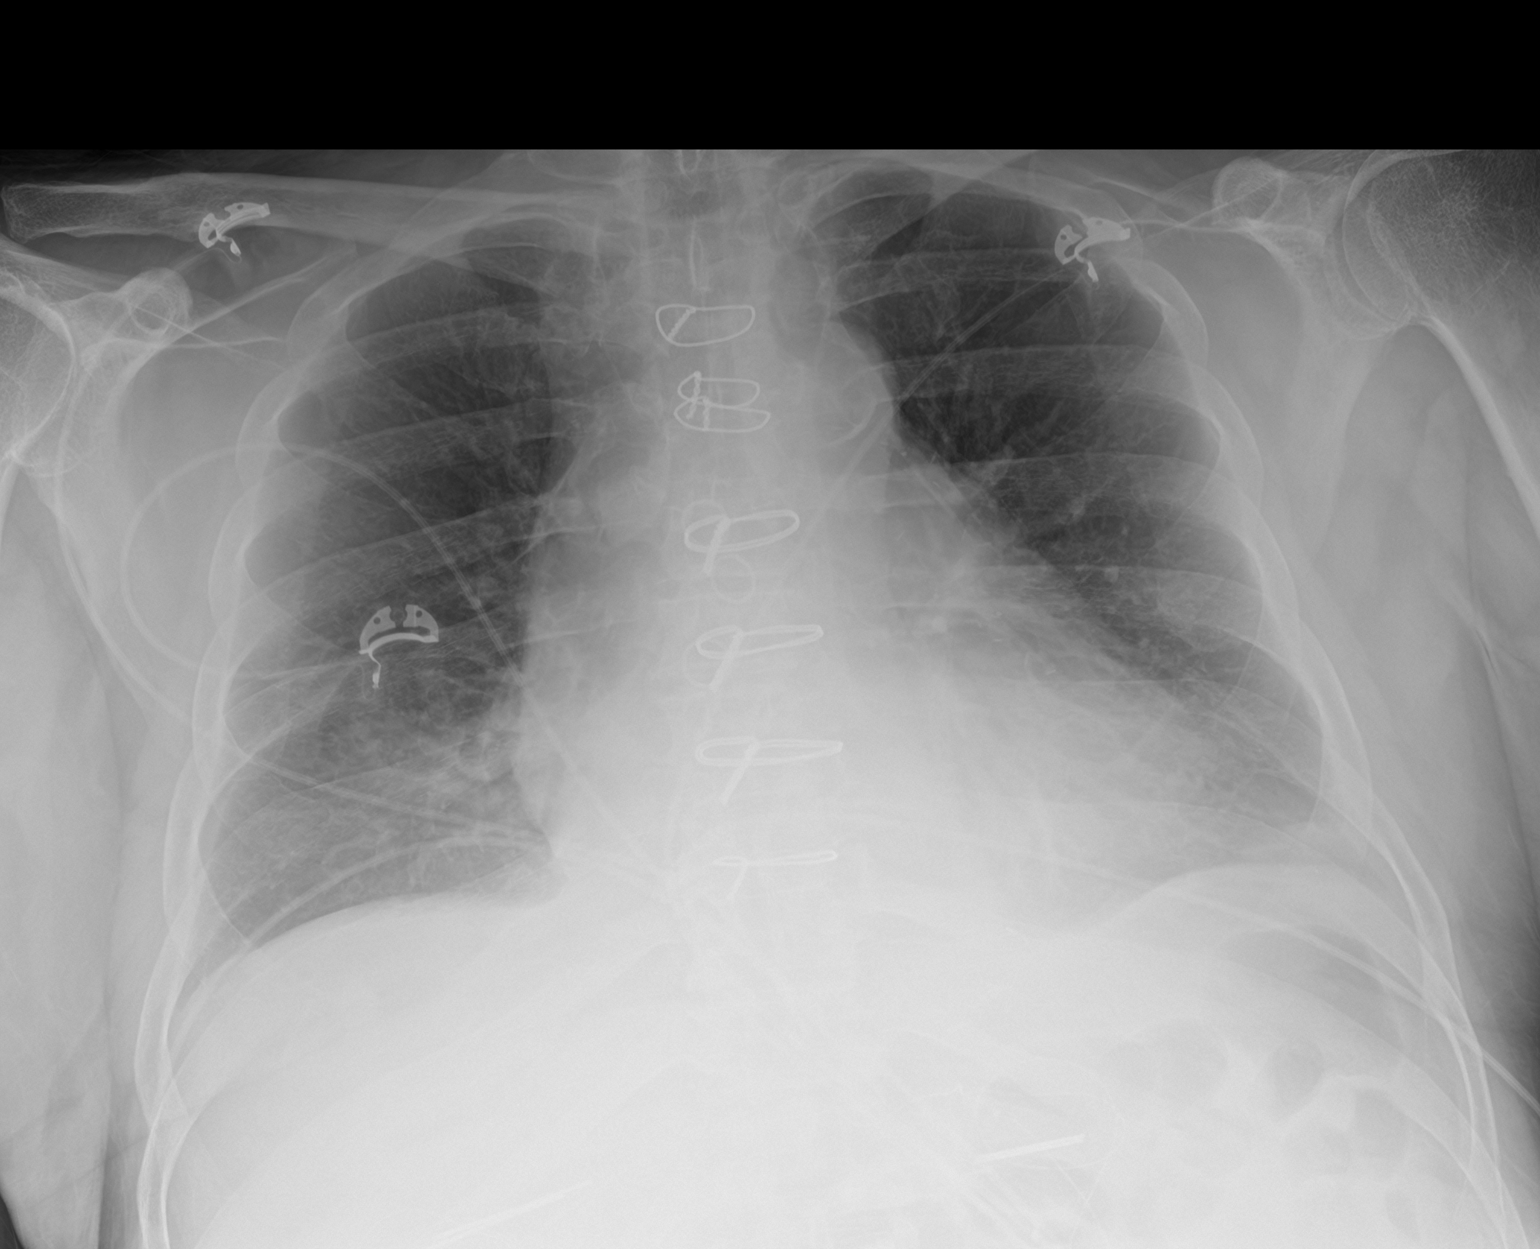

[2 of 2 positions shown; findings below may reference images not displayed]

FINDINGS: Interval removal of right IJ sheath, left chest tube, mediastinal
drainage catheter. Prior CABG. Cardiomegaly with normal pulmonary
vascularity. Low lung volumes with mild bibasilar atelectasis. Small
bilateral pleural effusions. No pneumothorax.
IMPRESSION: 1. Interval removal of right IJ sheath, left chest tube, mediastinal
drainage catheter. No pneumothorax.
2. Prior CABG.  Stable cardiomegaly.
3. Low lung volumes with bibasilar atelectasis and/or
infiltrates/mild edema. Small bilateral pleural effusions.

## 2018-01-05 ENCOUNTER — Other Ambulatory Visit: Payer: Self-pay | Admitting: Family Medicine

## 2018-01-07 ENCOUNTER — Encounter: Payer: Self-pay | Admitting: Family Medicine

## 2018-01-08 MED ORDER — ENALAPRIL MALEATE 2.5 MG PO TABS: 2.5000 mg | ORAL_TABLET | Freq: Every day | ORAL | 5 refills | Status: DC

## 2018-01-09 ENCOUNTER — Ambulatory Visit (INDEPENDENT_AMBULATORY_CARE_PROVIDER_SITE_OTHER): Payer: Medicare Other

## 2018-01-09 ENCOUNTER — Encounter: Payer: Self-pay | Admitting: Podiatry

## 2018-01-09 ENCOUNTER — Ambulatory Visit (INDEPENDENT_AMBULATORY_CARE_PROVIDER_SITE_OTHER): Payer: Medicare Other | Admitting: Podiatry

## 2018-01-09 DIAGNOSIS — B351 Tinea unguium: Secondary | ICD-10-CM | POA: Diagnosis not present

## 2018-01-09 DIAGNOSIS — M779 Enthesopathy, unspecified: Secondary | ICD-10-CM | POA: Diagnosis not present

## 2018-01-09 DIAGNOSIS — M659 Synovitis and tenosynovitis, unspecified: Secondary | ICD-10-CM | POA: Diagnosis not present

## 2018-01-09 DIAGNOSIS — M205X2 Other deformities of toe(s) (acquired), left foot: Secondary | ICD-10-CM

## 2018-01-09 DIAGNOSIS — E1149 Type 2 diabetes mellitus with other diabetic neurological complication: Secondary | ICD-10-CM

## 2018-01-09 MED ORDER — DICLOFENAC SODIUM 1 % TD GEL: 2.0000 g | Freq: Four times a day (QID) | TRANSDERMAL | 2 refills | Status: DC

## 2018-01-09 NOTE — Progress Notes (Signed)
Subjective:   Patient ID: Jack Wade, male   DOB: 77 y.o.   MRN: [id number]   HPI Mr. Kenneth presents to the office today with concerns of left foot pain and weakness. He does have a history of low back injury several years ago which resulted in some weakness to his left leg and this has been ongoing. He has a history of 4 back surgeries.  He describes a "pulling" sensation he points on the first MTPJ on the left foot when he gets discomfort.  He states that he gets worse after prolonged activity when he takes his shoes off at night.  He denies any tingling or sharp pains.  He does have some numbness in general to his feet.  No recent injury or trauma.  He also has concerns of his toenails which come off.  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   No other concerns today.  Last A1c is 7.3  Review of Systems  All other systems reviewed and are negative.  Past Medical History:  Diagnosis Date  . Aortic calcification ([place]) 02/09/2015  . Appendicitis   . Back pain 02/09/2015  . CAD (coronary artery disease)    2 stents  . Chicken pox   . Diabetes mellitus type 2 in obese ([place]) 04/11/2016  . Diabetes type 2, controlled ([place])   . Dysphagia, unspecified(787.20) 03/20/2014  . Hereditary and idiopathic peripheral neuropathy 02/09/2015  . Hyperlipidemia   . Hypertension   . Hypothyroidism   . Light headedness 07/04/2017  . Measles   . Medicare annual wellness visit, subsequent 06/14/2015  . Melanoma ([place])    Scalp. 2012  . Mumps as a child  . Thyroid disease    [place] Hyperthyroidism    Past Surgical History:  Procedure Laterality Date  . APPENDECTOMY    . BACK SURGERY     x2 lumbar  . BACK SURGERY     late 1990s had 2 surgeries, first surgery lifted a heavy engine ruptured disds, at L4 and L5, cleaned discs no hardware very helpful. 2 years later while recovering from  angiogram with weights applied to left leg caused a recurrence and required surgery again in same area with disc repair  . CARDIAC CATHETERIZATION    . CARDIAC CATHETERIZATION N/A 05/11/2016   Procedure: Left Heart Cath and Coronary Angiography;  Surgeon: Belva Crome, MD;  Location: [facility];  Service: Cardiovascular;  Laterality: N/A;  . CARDIAC CATHETERIZATION N/A 05/11/2016   Procedure: Intravascular Pressure Wire/FFR Study;  Surgeon: Belva Crome, MD;  Location: [facility];  Service: Cardiovascular;  Laterality: N/A;  . CORONARY ANGIOPLASTY WITH STENT PLACEMENT     2 stents  . CORONARY ARTERY BYPASS GRAFT N/A 05/17/2016   Procedure: CORONARY ARTERY BYPASS GRAFTING (CABG) x 6 using left mammory artery and right greater saphenous vein harvested endoscopically. [place] to LAD, SVG to Diagonal, SVG Sequential to OM2 and ramus intermediate, SVG Sequential to RCA and PDA;  Surgeon: [name], MD;  Location: [facility];  Service: Open Heart Surgery;  Laterality: N/A;  . KNEE ARTHROSCOPY     right knee  . LUMBAR LAMINECTOMY/DECOMPRESSION MICRODISCECTOMY Bilateral 04/10/2015   Procedure: LUMBAR LAMINECTOMY/DECOMPRESSION MICRODISCECTOMY 2 LEVELS;  Surgeon: Ashok Pall, MD;  Location: [facility];  Service: Neurosurgery;  Laterality: Bilateral;  Bilateral L45 L5S1 Laminectomy and Foraminotomy  .  ROTATOR CUFF REPAIR Right   . SKIN CANCER EXCISION     melanoma on scalp  . TEE WITHOUT CARDIOVERSION N/A 05/17/2016   Procedure: TRANSESOPHAGEAL ECHOCARDIOGRAM (TEE);  Surgeon: [name], MD;  Location: [facility];  Service: Open Heart Surgery;  Laterality: N/A;  . TONSILLECTOMY AND ADENOIDECTOMY    . WISDOM TOOTH EXTRACTION       Current Outpatient Medications:  .  aspirin 81 MG tablet, Take 81 mg by mouth daily., Disp: , Rfl:  .  enalapril (VASOTEC) 2.5 MG tablet, Take 1 tablet (2.5 mg total) by mouth daily., Disp: 30 tablet, Rfl: 5 .  glipiZIDE (GLUCOTROL) 5 MG tablet, Take 1  tablet (5 mg total) by mouth daily before breakfast. 1 tab po in am and 1/2 tab po in pm, Disp: 360 tablet, Rfl: 2 .  glipiZIDE (GLUCOTROL) 5 MG tablet, TAKE 2 TABLETS (10 MG TOTAL) BY MOUTH 2 (TWO) TIMES DAILY BEFORE A MEAL., Disp: 360 tablet, Rfl: 2 .  glucose blood (ONETOUCH VERIO) test strip, Use as directed three times daily to check blood sugar.  DX E11.8, Disp: 300 each, Rfl: 3 .  levothyroxine (SYNTHROID, LEVOTHROID) 100 MCG tablet, TAKE 1 TABLET (100 MCG TOTAL) BY MOUTH DAILY BEFORE BREAKFAST., Disp: 90 tablet, Rfl: 1 .  metFORMIN (GLUCOPHAGE-XR) 500 MG 24 hr tablet, TAKE 2 TABLETS BY MOUTH TWICE A DAY, Disp: 360 tablet, Rfl: 1 .  ONE TOUCH LANCETS MISC, Use as directed three times daily to check blood sugar.  DX E11.8, Disp: 300 each, Rfl: 3 .  ONETOUCH VERIO test strip, USE AS DIRECTED ONCE DAILY TO CHECK BLOOD SUGAR. DX E11.8, Disp: 100 each, Rfl: 2 .  rosuvastatin (CRESTOR) 40 MG tablet, TAKE 1 TABLET (40 MG TOTAL) BY MOUTH DAILY., Disp: 90 tablet, Rfl: 2  No Known Allergies  Social History   Socioeconomic History  . Marital status: Married    Spouse name: Not on file  . Number of children: 2  . Years of education: Not on file  . Highest education level: Not on file  Social Needs  . Financial resource strain: Not on file  . Food insecurity - worry: Not on file  . Food insecurity - inability: Not on file  . Transportation needs - medical: Not on file  . Transportation needs - non-medical: Not on file  Occupational History  . Not on file  Tobacco Use  . Smoking status: Former Smoker    Packs/day: 2.00    Years: 30.00    Pack years: 60.00    Types: Cigarettes    Start date: 12/27/1983  . Smokeless tobacco: Never Used  Substance and Sexual Activity  . Alcohol use: Yes    Alcohol/week: 0.0 oz    Comment: occasionally  . Drug use: No  . 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   Other Topics Concern  . Not on file   Social History Narrative  . Not on file      Objective:  Physical Exam  General: AAO x3, NAD  Dermatological: Skin is warm, dry and supple bilateral. Nails x 10 are well manicured; remaining integument appears unremarkable at this time. There are no open sores, no preulcerative lesions, no rash or signs of infection present.  Vascular: Dorsalis Pedis artery and Posterior Tibial artery pedal pulses are 2/4 bilateral with immedate capillary fill time.  There is no pain with calf compression, swelling, warmth, erythema.   Neruologic: Sensation mildly  decreased with Derrel Nip monofilament, decreased vibratory sensation.  Musculoskeletal: Mild discomfort to the dorsal aspect of the first MTPJ on the left side.  There is no area pinpoint bony tenderness or pain to vibratory sensation.  Range of motion of the MPJ appears to be intact there does appear to be some mild crepitation.  Overall rectus foot type.  Ankle, subtalar joint range of motion intact.  Muscular strength 5/5 in all groups tested bilateral.  Gait: Unassisted, Nonantalgic.     Assessment:    77 year old male left foot discomfort, likely due to hallux limitus, capsulitis; onychomycosis     Plan:  -Treatment options discussed including all alternatives, risks, and complications -Etiology of symptoms were discussed -X-rays were obtained and reviewed.  On the left side there is a decreased joint space of the first MPJ consistent with arthritic changes.  There is no evidence of acute fracture identified. -In regards to his foot discomfort this is his main concern.  We discussed the change in shoes to include a stiffer soled shoe as well as possible inserts to help take part of the first MPJ.  This is likely result of him having "weakness" to the leg since having back surgery she states. I don't think this is coming from neuropathy although he does have decreased sensation.  I would hold off on gabapentin at this time.  I ordered  a Voltaren gel to apply topically. -Ordered a compound cream through [facility] for onychomycosis.  -He agrees this plan has no further questions or concerns  Trula Slade DPM

## 2018-01-09 NOTE — Patient Instructions (Signed)

## 2018-01-28 IMAGING — CR DG CHEST 2V
2 series · 2 of 2 positions shown · non-contrast
Comparison: Radiograph June 02, 2016.

CLINICAL DATA: History of Coronary artery bypass graft.

EXAM:
CHEST  2 VIEW

[w chest pa]
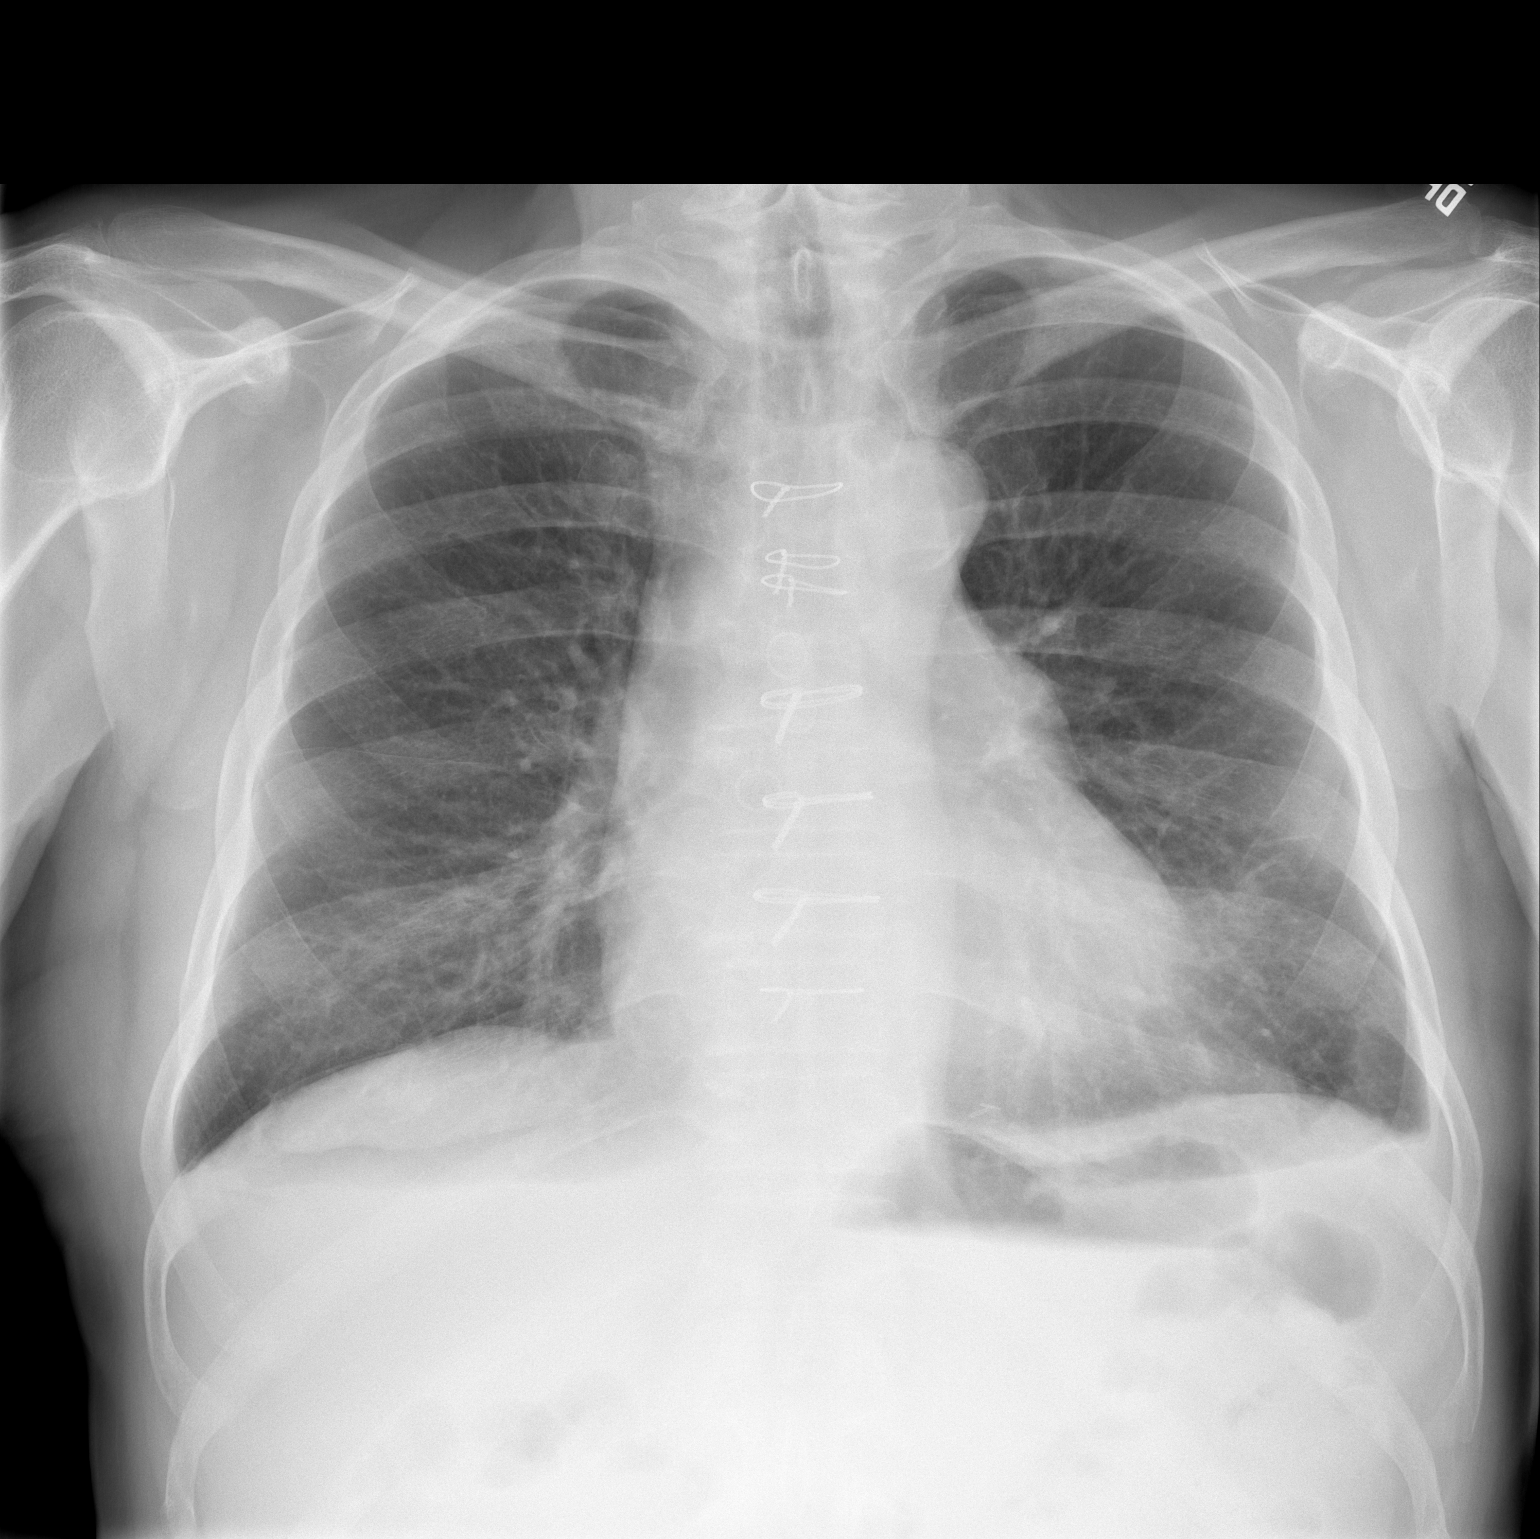

[w chest lat]
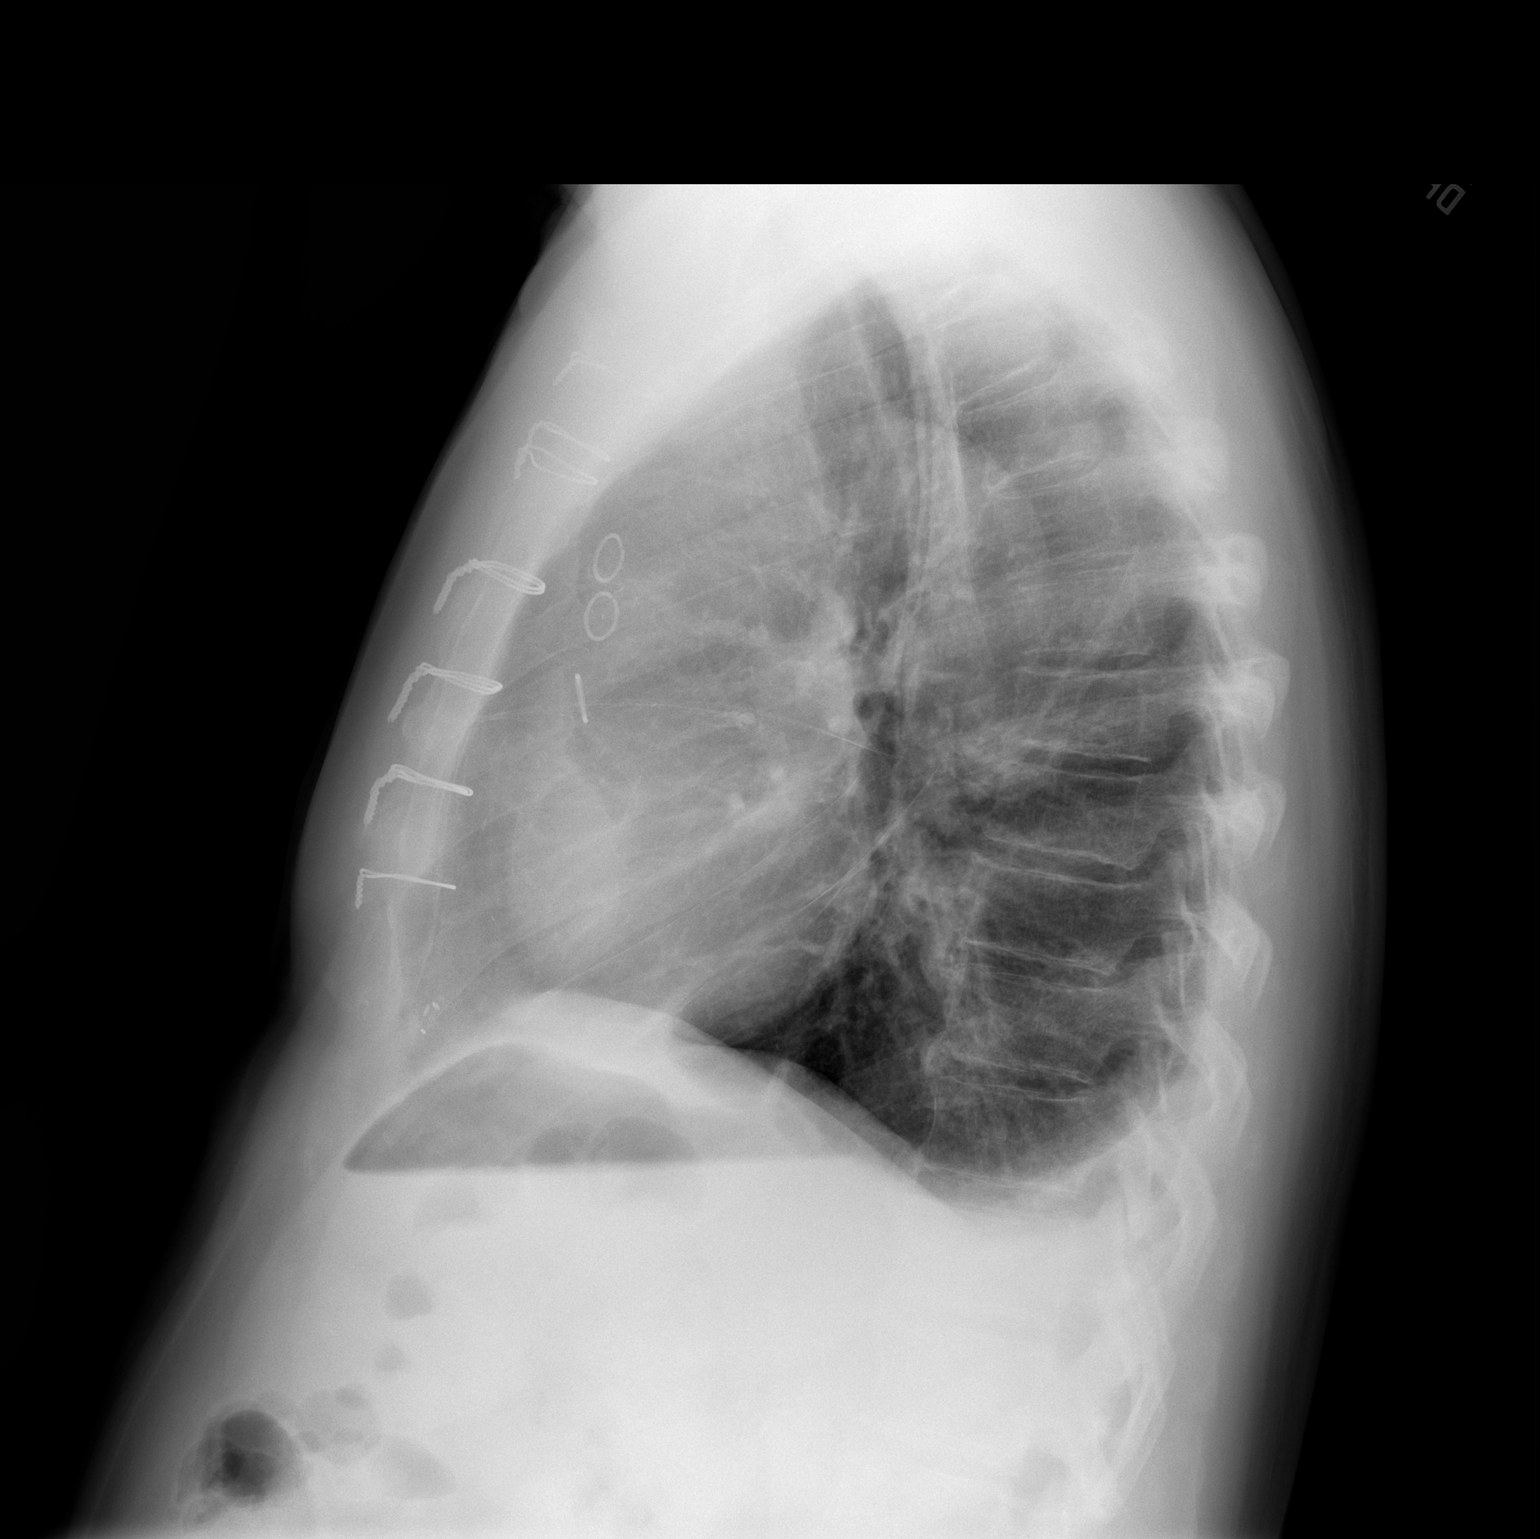

[2 of 2 positions shown; findings below may reference images not displayed]

FINDINGS: Stable cardiomegaly. Status post Coronary artery bypass graft. No
pneumothorax is noted. Minimal bilateral pleural effusions are
noted. Bony thorax is unremarkable. No acute pulmonary disease is
noted.
IMPRESSION: Minimal bilateral pleural effusions are noted. Status post Coronary
artery bypass graft.

## 2018-01-29 ENCOUNTER — Other Ambulatory Visit: Payer: Self-pay | Admitting: Cardiology

## 2018-01-29 DIAGNOSIS — E785 Hyperlipidemia, unspecified: Secondary | ICD-10-CM

## 2018-01-29 NOTE — Telephone Encounter (Signed)
Rx request sent to pharmacy.  

## 2018-02-28 ENCOUNTER — Encounter: Payer: Self-pay | Admitting: Cardiology

## 2018-03-01 ENCOUNTER — Other Ambulatory Visit: Payer: Self-pay | Admitting: *Deleted

## 2018-03-01 MED ORDER — ENALAPRIL MALEATE 2.5 MG PO TABS: 2.5000 mg | ORAL_TABLET | Freq: Every day | ORAL | 1 refills | Status: DC

## 2018-03-02 NOTE — Progress Notes (Signed)
HPI: FU coronary artery disease. Lower extremity Dopplers February 2016 showed an occluded right anterior tibial artery. Abdominal ultrasound February 2016 showed no aneurysm. Patient had cardiac catheterization May 2017 secondary to continued exertional chest pain. He was found to have severe three-vessel coronary artery disease and normal LV function. Echocardiogram showed normal LV systolic function, grade 1 diastolic dysfunction and mildly dilated aortic root. Note carotid Dopplers May 2017 Prior to surgery showed 1-39% bilateral stenosis. Patient had coronary artery bypass and graft With LIMA to the LAD, saphenous vein graft to second diagonal, sequential saphenous vein graft to the intermediate and distal circumflex, and sequential saphenous vein graft to the right coronary artery and PDA. Since he was last seen, the patient denies any dyspnea on exertion, orthopnea, PND, pedal edema, palpitations, syncope or chest pain.  Some dizziness with standing.   Current Outpatient Medications  Medication Sig Dispense Refill  . aspirin 81 MG tablet Take 81 mg by mouth daily.    . enalapril (VASOTEC) 2.5 MG tablet Take 1 tablet (2.5 mg total) by mouth daily. 90 tablet 1  . glipiZIDE (GLUCOTROL) 5 MG tablet Take 1 tablet (5 mg total) by mouth daily before breakfast. 1 tab po in am and 1/2 tab po in pm (Patient taking differently: Take 5 mg by mouth daily before breakfast. 1 tab po in am and 1 tab po in pm) 360 tablet 2  . levothyroxine (SYNTHROID, LEVOTHROID) 100 MCG tablet TAKE 1 TABLET (100 MCG TOTAL) BY MOUTH DAILY BEFORE BREAKFAST. 90 tablet 1  . metFORMIN (GLUCOPHAGE-XR) 500 MG 24 hr tablet TAKE 2 TABLETS BY MOUTH TWICE A DAY 360 tablet 1  . ONE TOUCH LANCETS MISC Use as directed three times daily to check blood sugar.  DX E11.8 300 each 3  . ONETOUCH VERIO test strip USE AS DIRECTED ONCE DAILY TO CHECK BLOOD SUGAR. DX E11.8 100 each 2  . rosuvastatin (CRESTOR) 40 MG tablet TAKE 1 TABLET BY  MOUTH EVERY DAY 90 tablet 1   No current facility-administered medications for this visit.      Past Medical History:  Diagnosis Date  . Aortic calcification ([place]) 02/09/2015  . Appendicitis   . Back pain 02/09/2015  . CAD (coronary artery disease)    2 stents  . Chicken pox   . Diabetes mellitus type 2 in obese ([place]) 04/11/2016  . Diabetes type 2, controlled ([place])   . Dysphagia, unspecified(787.20) 03/20/2014  . Hereditary and idiopathic peripheral neuropathy 02/09/2015  . Hyperlipidemia   . Hypertension   . Hypothyroidism   . Light headedness 07/04/2017  . Measles   . Medicare annual wellness visit, subsequent 06/14/2015  . Melanoma ([place])    Scalp. 2012  . Mumps as a child  . Thyroid disease    [place] Hyperthyroidism    Past Surgical History:  Procedure Laterality Date  . APPENDECTOMY    . BACK SURGERY     x2 lumbar  . BACK SURGERY     late 1990s had 2 surgeries, first surgery lifted a heavy engine ruptured disds, at L4 and L5, cleaned discs no hardware very helpful. 2 years later while recovering from angiogram with weights applied to left leg caused a recurrence and required surgery again in same area with disc repair  . CARDIAC CATHETERIZATION    . CARDIAC CATHETERIZATION N/A 05/11/2016   Procedure: Left Heart Cath and Coronary Angiography;  Surgeon: Belva Crome, MD;  Location: [facility];  Service: Cardiovascular;  Laterality: N/A;  . CARDIAC CATHETERIZATION N/A 05/11/2016   Procedure: Intravascular Pressure Wire/FFR Study;  Surgeon: Belva Crome, MD;  Location: [facility];  Service: Cardiovascular;  Laterality: N/A;  . CORONARY ANGIOPLASTY WITH STENT PLACEMENT     2 stents  . CORONARY ARTERY BYPASS GRAFT N/A 05/17/2016   Procedure: CORONARY ARTERY BYPASS GRAFTING (CABG) x 6 using left mammory artery and right greater saphenous vein harvested endoscopically. [place] to LAD, SVG to Diagonal, SVG Sequential to OM2 and ramus intermediate, SVG Sequential to RCA  and PDA;  Surgeon: [name], MD;  Location: [facility];  Service: Open Heart Surgery;  Laterality: N/A;  . KNEE ARTHROSCOPY     right knee  . LUMBAR LAMINECTOMY/DECOMPRESSION MICRODISCECTOMY Bilateral 04/10/2015   Procedure: LUMBAR LAMINECTOMY/DECOMPRESSION MICRODISCECTOMY 2 LEVELS;  Surgeon: Ashok Pall, MD;  Location: [facility];  Service: Neurosurgery;  Laterality: Bilateral;  Bilateral L45 L5S1 Laminectomy and Foraminotomy  . ROTATOR CUFF REPAIR Right   . SKIN CANCER EXCISION     melanoma on scalp  . TEE WITHOUT CARDIOVERSION N/A 05/17/2016   Procedure: TRANSESOPHAGEAL ECHOCARDIOGRAM (TEE);  Surgeon: [name], MD;  Location: [facility];  Service: Open Heart Surgery;  Laterality: N/A;  . TONSILLECTOMY AND ADENOIDECTOMY    . WISDOM TOOTH EXTRACTION      Social History   Socioeconomic History  . Marital status: Married    Spouse name: Not on file  . Number of children: 2  . Years of education: Not on file  . Highest education level: Not on file  Social Needs  . Financial resource strain: Not on file  . Food insecurity - worry: Not on file  . Food insecurity - inability: Not on file  . Transportation needs - medical: Not on file  . Transportation needs - non-medical: Not on file  Occupational History  . Not on file  Tobacco Use  . Smoking status: Former Smoker    Packs/day: 2.00    Years: 30.00    Pack years: 60.00    Types: Cigarettes    Start date: 12/27/1983  . Smokeless tobacco: Never Used  Substance and Sexual Activity  . Alcohol use: Yes    Alcohol/week: 0.0 oz    Comment: occasionally  . Drug use: No  . 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   Other Topics Concern  . Not on file  Social History Narrative  . Not on file    Family History  Problem Relation Age of Onset  . Heart disease Father   . Emphysema Father   . Hypertension Father   . Cancer Father        lung/ healthy  . Heart disease  Mother        Deceased  . Cancer Mother        breast  . Hypertension Mother   . Cancer Maternal Grandmother        colon  . Diabetes Brother        type 2  . Heart disease Brother   . Pulmonary embolism Son        vasculiti  . Depression Paternal Grandmother     ROS: no fevers or chills, productive cough, hemoptysis, dysphasia, odynophagia, melena, hematochezia, dysuria, hematuria, rash, seizure activity, orthopnea, PND, pedal edema, claudication. Remaining systems are negative.  Physical Exam: Well-developed well-nourished in no acute distress.  Skin is warm and dry.  HEENT is normal.  Neck  is supple.  Chest is clear to auscultation with normal expansion.  Cardiovascular exam is regular rate and rhythm.  Abdominal exam nontender or distended. No masses palpated. Extremities show no edema. neuro grossly intact  ECG-normal sinus rhythm at a rate of 64.  Right bundle branch block.  Personally reviewed  A/P  1 coronary artery disease-patient is status post coronary artery bypass and graft.  He is not having chest pain.  Plan to continue medical therapy with aspirin and statin.  2 vascular disease-plan to continue aspirin and statin.  3 hyperlipidemia-continue statin.  Lipids and liver are monitored by primary care.  4 orthostatic hypotension-symptoms reasonably well controlled.  Continue to maintain hydration and sodium intake.  Kirk Ruths, MD

## 2018-03-06 ENCOUNTER — Encounter: Payer: Self-pay | Admitting: Family Medicine

## 2018-03-06 ENCOUNTER — Ambulatory Visit (INDEPENDENT_AMBULATORY_CARE_PROVIDER_SITE_OTHER): Payer: Medicare Other | Admitting: Family Medicine

## 2018-03-06 VITALS — BP 105/60 | HR 67 | Resp 16 | Ht 72.0 in | Wt 194.8 lb

## 2018-03-06 DIAGNOSIS — R351 Nocturia: Secondary | ICD-10-CM

## 2018-03-06 DIAGNOSIS — R350 Frequency of micturition: Secondary | ICD-10-CM

## 2018-03-06 DIAGNOSIS — R809 Proteinuria, unspecified: Secondary | ICD-10-CM | POA: Diagnosis not present

## 2018-03-06 DIAGNOSIS — I10 Essential (primary) hypertension: Secondary | ICD-10-CM

## 2018-03-06 DIAGNOSIS — I2510 Atherosclerotic heart disease of native coronary artery without angina pectoris: Secondary | ICD-10-CM

## 2018-03-06 DIAGNOSIS — E785 Hyperlipidemia, unspecified: Secondary | ICD-10-CM | POA: Diagnosis not present

## 2018-03-06 DIAGNOSIS — E118 Type 2 diabetes mellitus with unspecified complications: Secondary | ICD-10-CM | POA: Diagnosis not present

## 2018-03-06 LAB — POC URINALSYSI DIPSTICK (AUTOMATED)
Bilirubin, UA: NEGATIVE
Blood, UA: NEGATIVE
Glucose, UA: NEGATIVE
Ketones, UA: NEGATIVE
Leukocytes, UA: NEGATIVE
Nitrite, UA: NEGATIVE
Protein, UA: POSITIVE
Spec Grav, UA: >=1.03 — AB (ref 1.010–1.025)
Urobilinogen, UA: 0.2 E.U./dL
pH, UA: 6 (ref 5.0–8.0)

## 2018-03-06 MED ORDER — ENALAPRIL MALEATE 2.5 MG PO TABS
2.5000 mg | ORAL_TABLET | Freq: Every day | ORAL | 1 refills | Status: DC
Start: 2018-03-06 — End: 2019-02-11

## 2018-03-06 NOTE — Progress Notes (Signed)
Subjective:  I acted as a Education administrator for Dr. Charlett Blake. [name], [place]   Patient ID: Jack Wade, male    DOB: 10/16/1941, 77 y.o.   MRN: [id number]  Chief Complaint  Patient presents with  . Urinary Tract Infection    urinary frequency, couple months, no other sxs    HPI  Patient is in today for an acute visit for urine frequency most notably at night. He also notes increasing change in the strength of the stream. It has lessened in intensity but no dysuria or hematuria. No recent febrile illness or hospitalizations. Sugars have been below 150 and no polydipsia. Denies CP/palp/SOB/HA/congestion/fevers/GI c/o. Taking meds as prescribed  Patient Care Team: Mosie Lukes, MD as PCP - General (Family Medicine) Ashok Pall, MD as Consulting Physician (Neurosurgery) Stanford Breed Denice Bors, MD as Consulting Physician (Cardiology)   Past Medical History:  Diagnosis Date  . Aortic calcification ([place]) 02/09/2015  . Appendicitis   . Back pain 02/09/2015  . CAD (coronary artery disease)    2 stents  . Chicken pox   . Diabetes mellitus type 2 in obese ([place]) 04/11/2016  . Diabetes type 2, controlled ([place])   . Dysphagia, unspecified(787.20) 03/20/2014  . Hereditary and idiopathic peripheral neuropathy 02/09/2015  . Hyperlipidemia   . Hypertension   . Hypothyroidism   . Light headedness 07/04/2017  . Measles   . Medicare annual wellness visit, subsequent 06/14/2015  . Melanoma ([place])    Scalp. 2012  . Mumps as a child  . Thyroid disease    Beoming Hyperthyroidism    Past Surgical History:  Procedure Laterality Date  . APPENDECTOMY    . BACK SURGERY     x2 lumbar  . BACK SURGERY     late 1990s had 2 surgeries, first surgery lifted a heavy engine ruptured disds, at L4 and L5, cleaned discs no hardware very helpful. 2 years later while recovering from angiogram with weights applied to left leg caused a recurrence and required surgery again in same area with disc repair  . CARDIAC  CATHETERIZATION    . CARDIAC CATHETERIZATION N/A 05/11/2016   Procedure: Left Heart Cath and Coronary Angiography;  Surgeon: Belva Crome, MD;  Location: [facility];  Service: Cardiovascular;  Laterality: N/A;  . CARDIAC CATHETERIZATION N/A 05/11/2016   Procedure: Intravascular Pressure Wire/FFR Study;  Surgeon: Belva Crome, MD;  Location: [facility];  Service: Cardiovascular;  Laterality: N/A;  . CORONARY ANGIOPLASTY WITH STENT PLACEMENT     2 stents  . CORONARY ARTERY BYPASS GRAFT N/A 05/17/2016   Procedure: CORONARY ARTERY BYPASS GRAFTING (CABG) x 6 using left mammory artery and right greater saphenous vein harvested endoscopically. [place] to LAD, SVG to Diagonal, SVG Sequential to OM2 and ramus intermediate, SVG Sequential to RCA and PDA;  Surgeon: [name], MD;  Location: [facility];  Service: Open Heart Surgery;  Laterality: N/A;  . KNEE ARTHROSCOPY     right knee  . LUMBAR LAMINECTOMY/DECOMPRESSION MICRODISCECTOMY Bilateral 04/10/2015   Procedure: LUMBAR LAMINECTOMY/DECOMPRESSION MICRODISCECTOMY 2 LEVELS;  Surgeon: Ashok Pall, MD;  Location: [facility];  Service: Neurosurgery;  Laterality: Bilateral;  Bilateral L45 L5S1 Laminectomy and Foraminotomy  . ROTATOR CUFF REPAIR Right   . SKIN CANCER EXCISION     melanoma on scalp  . TEE WITHOUT CARDIOVERSION N/A 05/17/2016   Procedure: TRANSESOPHAGEAL ECHOCARDIOGRAM (TEE);  Surgeon: [name], MD;  Location: [facility];  Service: Open Heart Surgery;  Laterality: N/A;  .  TONSILLECTOMY AND ADENOIDECTOMY    . WISDOM TOOTH EXTRACTION      Family History  Problem Relation Age of Onset  . Heart disease Father   . Emphysema Father   . Hypertension Father   . Cancer Father        lung/ healthy  . Heart disease Mother        Deceased  . Cancer Mother        breast  . Hypertension Mother   . Cancer Maternal Grandmother        colon  . Diabetes Brother        type 2  . Heart disease Brother   . Pulmonary embolism  Son        vasculiti  . Depression Paternal Grandmother     Social History   Socioeconomic History  . Marital status: Married    Spouse name: Not on file  . Number of children: 2  . Years of education: Not on file  . Highest education level: Not on file  Social Needs  . Financial resource strain: Not on file  . Food insecurity - worry: Not on file  . Food insecurity - inability: Not on file  . Transportation needs - medical: Not on file  . Transportation needs - non-medical: Not on file  Occupational History  . Not on file  Tobacco Use  . Smoking status: Former Smoker    Packs/day: 2.00    Years: 30.00    Pack years: 60.00    Types: Cigarettes    Start date: 12/27/1983  . Smokeless tobacco: Never Used  Substance and Sexual Activity  . Alcohol use: Yes    Alcohol/week: 0.0 oz    Comment: occasionally  . Drug use: No  . 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   Other Topics Concern  . Not on file  Social History Narrative  . Not on file    Outpatient Medications Prior to Visit  Medication Sig Dispense Refill  . aspirin 81 MG tablet Take 81 mg by mouth daily.    Marland Kitchen glipiZIDE (GLUCOTROL) 5 MG tablet Take 1 tablet (5 mg total) by mouth daily before breakfast. 1 tab po in am and 1/2 tab po in pm (Patient taking differently: Take 5 mg by mouth daily before breakfast. 1 tab po in am and 1 tab po in pm) 360 tablet 2  . levothyroxine (SYNTHROID, LEVOTHROID) 100 MCG tablet TAKE 1 TABLET (100 MCG TOTAL) BY MOUTH DAILY BEFORE BREAKFAST. 90 tablet 1  . metFORMIN (GLUCOPHAGE-XR) 500 MG 24 hr tablet TAKE 2 TABLETS BY MOUTH TWICE A DAY 360 tablet 1  . ONE TOUCH LANCETS MISC Use as directed three times daily to check blood sugar.  DX E11.8 300 each 3  . ONETOUCH VERIO test strip USE AS DIRECTED ONCE DAILY TO CHECK BLOOD SUGAR. DX E11.8 100 each 2  . enalapril (VASOTEC) 2.5 MG tablet Take 1 tablet (2.5 mg total) by mouth daily. 90  tablet 1  . glucose blood (ONETOUCH VERIO) test strip Use as directed three times daily to check blood sugar.  DX E11.8 (Patient not taking: Reported on 03/07/2018) 300 each 3  . rosuvastatin (CRESTOR) 40 MG tablet TAKE 1 TABLET BY MOUTH EVERY DAY 90 tablet 1  . diclofenac sodium (VOLTAREN) 1 % GEL Apply 2 g topically 4 (four) times daily. Rub into affected area of foot 2 to 4 times daily 100 g 2  .  glipiZIDE (GLUCOTROL) 5 MG tablet TAKE 2 TABLETS (10 MG TOTAL) BY MOUTH 2 (TWO) TIMES DAILY BEFORE A MEAL. 360 tablet 2  . NON FORMULARY [facility]  Onychomycosis Nail Lacquer -  Fluconazole 2%, Terbinafine 1% DMSO Apply to affected nail once daily Qty. 120 gm 3 refills     No facility-administered medications prior to visit.     No Known Allergies  Review of Systems  Constitutional: Negative for fever and malaise/fatigue.  HENT: Negative for congestion.   Eyes: Negative for blurred vision.  Respiratory: Negative for shortness of breath.   Cardiovascular: Negative for chest pain, palpitations and leg swelling.  Gastrointestinal: Negative for abdominal pain, blood in stool and nausea.  Genitourinary: Positive for frequency and urgency. Negative for dysuria.  Musculoskeletal: Negative for falls.  Skin: Negative for rash.  Neurological: Negative for dizziness, loss of consciousness and headaches.  Endo/Heme/Allergies: Negative for environmental allergies.  Psychiatric/Behavioral: Negative for depression. The patient is not nervous/anxious.        Objective:    Physical Exam  Constitutional: He is oriented to person, place, and time. He appears well-developed and well-nourished. No distress.  HENT:  Head: Normocephalic and atraumatic.  Nose: Nose normal.  Eyes: Right eye exhibits no discharge. Left eye exhibits no discharge.  Neck: Normal range of motion. Neck supple.  Cardiovascular: Normal rate and regular rhythm.  No murmur heard. Pulmonary/Chest: Effort normal and breath  sounds normal.  Abdominal: Soft. Bowel sounds are normal. There is no tenderness.  Musculoskeletal: He exhibits no edema.  Neurological: He is alert and oriented to person, place, and time.  Skin: Skin is warm and dry.  Psychiatric: He has a normal mood and affect.  Nursing note and vitals reviewed.   BP 105/60 (BP Location: Right Arm, Patient Position: Sitting, Cuff Size: Large)   Pulse 67   Resp 16   Ht 6' (1.829 m)   Wt 194 lb 12.8 oz (88.4 kg)   SpO2 98%   BMI 26.42 kg/m  Wt Readings from Last 3 Encounters:  03/07/18 194 lb (88 kg)  03/06/18 194 lb 12.8 oz (88.4 kg)  12/07/17 193 lb 9.6 oz (87.8 kg)   BP Readings from Last 3 Encounters:  03/07/18 107/67  03/06/18 105/60  12/07/17 110/62     Immunization History  Administered Date(s) Administered  . Influenza, High Dose Seasonal PF 08/30/2016, 09/23/2017  . Influenza,inj,Quad PF,6+ Mos 11/10/2014  . Influenza-Unspecified 11/28/2002, 10/21/2008, 09/25/2012  . Pneumococcal Polysaccharide-23 10/31/2013  . Pneumococcal-Unspecified 02/20/2006, 11/02/2012  . Td 06/10/1999  . Tdap 01/14/2011, 03/20/2014  . Zoster 01/14/2011    Health Maintenance  Topic Date Due  . PNA vac Low Risk Adult (2 of 2 - PCV13) 10/31/2014  . FOOT EXAM  09/16/2017  . HEMOGLOBIN A1C  06/07/2018  . OPHTHALMOLOGY EXAM  07/14/2018  . COLONOSCOPY  03/20/2023  . TETANUS/TDAP  03/20/2024  . INFLUENZA VACCINE  Completed    Lab Results  Component Value Date   WBC 5.7 12/07/2017   HGB 14.2 12/07/2017   HCT 43.1 12/07/2017   PLT 197.0 12/07/2017   GLUCOSE 177 (H) 12/07/2017   CHOL 75 12/07/2017   TRIG 99.0 12/07/2017   HDL 41.50 12/07/2017   LDLDIRECT 43.0 04/11/2016   LDLCALC 14 12/07/2017   ALT 13 12/07/2017   AST 20 12/07/2017   NA 140 12/07/2017   K 5.1 12/07/2017   CL 106 12/07/2017   CREATININE 1.48 12/07/2017   BUN 17 12/07/2017   CO2 29 12/07/2017  TSH 0.68 12/07/2017   INR 1.61 (H) 05/17/2016   HGBA1C 7.3 (H) 12/07/2017    MICROALBUR 11.3 (H) 12/07/2017    Lab Results  Component Value Date   TSH 0.68 12/07/2017   Lab Results  Component Value Date   WBC 5.7 12/07/2017   HGB 14.2 12/07/2017   HCT 43.1 12/07/2017   MCV 85.1 12/07/2017   PLT 197.0 12/07/2017   Lab Results  Component Value Date   NA 140 12/07/2017   K 5.1 12/07/2017   CO2 29 12/07/2017   GLUCOSE 177 (H) 12/07/2017   BUN 17 12/07/2017   CREATININE 1.48 12/07/2017   BILITOT 0.5 12/07/2017   ALKPHOS 58 12/07/2017   AST 20 12/07/2017   ALT 13 12/07/2017   PROT 7.1 12/07/2017   ALBUMIN 4.3 12/07/2017   CALCIUM 9.8 12/07/2017   ANIONGAP 7 10/01/2016   GFR 49.24 (L) 12/07/2017   Lab Results  Component Value Date   CHOL 75 12/07/2017   Lab Results  Component Value Date   HDL 41.50 12/07/2017   Lab Results  Component Value Date   LDLCALC 14 12/07/2017   Lab Results  Component Value Date   TRIG 99.0 12/07/2017   Lab Results  Component Value Date   CHOLHDL 2 12/07/2017   Lab Results  Component Value Date   HGBA1C 7.3 (H) 12/07/2017         Assessment & Plan:   Problem List Items Addressed This Visit    Hyperlipidemia   Relevant Medications   enalapril (VASOTEC) 2.5 MG tablet   Other Relevant Orders   Lipid panel   HTN (hypertension)    Well controlled, no changes to meds. Encouraged heart healthy diet such as the DASH diet and exercise as tolerated.       Relevant Medications   enalapril (VASOTEC) 2.5 MG tablet   Other Relevant Orders   CBC with Differential/Platelet   Comprehensive metabolic panel   TSH   Type 2 diabetes with complication (HCC)    [id number] acceptable, minimize simple carbs. Increase exercise as tolerated.       Relevant Medications   enalapril (VASOTEC) 2.5 MG tablet   Other Relevant Orders   Hemoglobin A1c   Nocturia    No dysuria or hematuria. Urinalysis unremarkable but will await urine culture. If all negative consider referral to urology for further consideration.        Relevant Orders   CBC with Differential/Platelet   Sedimentation rate   PSA   Proteinuria    Tolerating Enalapril will monitor       Other Visit Diagnoses    Urinary frequency    -  Primary   Relevant Orders   POCT Urinalysis Dipstick (Automated) (Completed)   Urine Culture   Sedimentation rate   PSA      I have discontinued Gwyndolyn Saxon C. Dodgen's diclofenac sodium and NON FORMULARY. I am also having him maintain his aspirin, ONE TOUCH LANCETS, glipiZIDE, ONETOUCH VERIO, levothyroxine, metFORMIN, and enalapril.  Meds ordered this encounter  Medications  . enalapril (VASOTEC) 2.5 MG tablet    Sig: Take 1 tablet (2.5 mg total) by mouth daily.    Dispense:  90 tablet    Refill:  1    CMA served as Education administrator during this visit. History, Physical and Plan performed by medical provider. Documentation and orders reviewed and attested to.  Penni Homans, MD

## 2018-03-06 NOTE — Patient Instructions (Signed)

## 2018-03-07 ENCOUNTER — Other Ambulatory Visit (INDEPENDENT_AMBULATORY_CARE_PROVIDER_SITE_OTHER): Payer: Medicare Other

## 2018-03-07 ENCOUNTER — Encounter: Payer: Self-pay | Admitting: Cardiology

## 2018-03-07 ENCOUNTER — Ambulatory Visit (INDEPENDENT_AMBULATORY_CARE_PROVIDER_SITE_OTHER): Payer: Medicare Other | Admitting: Cardiology

## 2018-03-07 VITALS — BP 107/67 | HR 64 | Ht 72.0 in | Wt 194.0 lb

## 2018-03-07 DIAGNOSIS — R351 Nocturia: Secondary | ICD-10-CM | POA: Insufficient documentation

## 2018-03-07 DIAGNOSIS — E118 Type 2 diabetes mellitus with unspecified complications: Secondary | ICD-10-CM | POA: Diagnosis not present

## 2018-03-07 DIAGNOSIS — E785 Hyperlipidemia, unspecified: Secondary | ICD-10-CM | POA: Diagnosis not present

## 2018-03-07 DIAGNOSIS — R350 Frequency of micturition: Secondary | ICD-10-CM

## 2018-03-07 DIAGNOSIS — I2510 Atherosclerotic heart disease of native coronary artery without angina pectoris: Secondary | ICD-10-CM | POA: Diagnosis not present

## 2018-03-07 DIAGNOSIS — I739 Peripheral vascular disease, unspecified: Secondary | ICD-10-CM

## 2018-03-07 DIAGNOSIS — I10 Essential (primary) hypertension: Secondary | ICD-10-CM

## 2018-03-07 DIAGNOSIS — R809 Proteinuria, unspecified: Secondary | ICD-10-CM | POA: Insufficient documentation

## 2018-03-07 LAB — COMPREHENSIVE METABOLIC PANEL
ALT: 16 U/L (ref 0–53)
AST: 19 U/L (ref 0–37)
Albumin: 4.4 g/dL (ref 3.5–5.2)
Alkaline Phosphatase: 61 U/L (ref 39–117)
BUN: 20 mg/dL (ref 6–23)
CO2: 27 mEq/L (ref 19–32)
Calcium: 10.2 mg/dL (ref 8.4–10.5)
Chloride: 103 mEq/L (ref 96–112)
Creatinine, Ser: 1.4 mg/dL (ref 0.40–1.50)
GFR: 52.47 mL/min — ABNORMAL LOW (ref >60.00)
Glucose, Bld: 216 mg/dL — ABNORMAL HIGH (ref 70–99)
Potassium: 4.8 mEq/L (ref 3.5–5.1)
Sodium: 137 mEq/L (ref 135–145)
Total Bilirubin: 0.5 mg/dL (ref 0.2–1.2)
Total Protein: 6.8 g/dL (ref 6.0–8.3)

## 2018-03-07 LAB — CBC WITH DIFFERENTIAL/PLATELET
Basophils Absolute: 0.1 10*3/uL (ref 0.0–0.1)
Basophils Relative: 1.8 % (ref 0.0–3.0)
Eosinophils Absolute: 0.3 10*3/uL (ref 0.0–0.7)
Eosinophils Relative: 4.6 % (ref 0.0–5.0)
HCT: 42.1 % (ref 39.0–52.0)
Hemoglobin: 14.1 g/dL (ref 13.0–17.0)
Lymphocytes Relative: 18.6 % (ref 12.0–46.0)
Lymphs Abs: 1.3 10*3/uL (ref 0.7–4.0)
MCHC: 33.6 g/dL (ref 30.0–36.0)
MCV: 83 fl (ref 78.0–100.0)
Monocytes Absolute: 0.8 10*3/uL (ref 0.1–1.0)
Monocytes Relative: 11 % (ref 3.0–12.0)
Neutro Abs: 4.5 10*3/uL (ref 1.4–7.7)
Neutrophils Relative %: 64 % (ref 43.0–77.0)
Platelets: 213 10*3/uL (ref 150.0–400.0)
RBC: 5.07 Mil/uL (ref 4.22–5.81)
RDW: 13.6 % (ref 11.5–15.5)
WBC: 7 10*3/uL (ref 4.0–10.5)

## 2018-03-07 LAB — TSH: TSH: 0.84 u[IU]/mL (ref 0.35–4.50)

## 2018-03-07 LAB — LIPID PANEL
Cholesterol: 88 mg/dL (ref 0–200)
HDL: 46.2 mg/dL (ref >39.00)
LDL Cholesterol: 17 mg/dL (ref 0–99)
NonHDL: 42.09
Total CHOL/HDL Ratio: 2
Triglycerides: 126 mg/dL (ref 0.0–149.0)
VLDL: 25.2 mg/dL (ref 0.0–40.0)

## 2018-03-07 LAB — SEDIMENTATION RATE: Sed Rate: 1 mm/hr (ref 0–20)

## 2018-03-07 LAB — PSA: PSA: 0.71 ng/mL (ref 0.10–4.00)

## 2018-03-07 LAB — HEMOGLOBIN A1C: Hgb A1c MFr Bld: 7.1 % — ABNORMAL HIGH (ref 4.6–6.5)

## 2018-03-07 MED ORDER — ROSUVASTATIN CALCIUM 40 MG PO TABS: 40.0000 mg | ORAL_TABLET | Freq: Every day | ORAL | 3 refills | Status: DC

## 2018-03-07 NOTE — Patient Instructions (Signed)
Medication Instructions:  Your physician recommends that you continue on your current medications as directed. Please refer to the Current Medication list given to you today. 1. Patient's rosuvastatin was sent in to patient's requested pharmacy today.  Labwork: None  Testing/Procedures: None  Follow-Up: Your physician wants you to follow-up in: 1 year f/u with Dr. Stanford Breed.  You will receive a reminder letter in the mail two months in advance. If you don't receive a letter, please call our office to schedule the follow-up appointment.   Any Other Special Instructions Will Be Listed Below (If Applicable).     If you need a refill on your cardiac medications before your next appointment, please call your pharmacy.

## 2018-03-07 NOTE — Assessment & Plan Note (Signed)
hgba1c acceptable, minimize simple carbs. Increase exercise as tolerated.  

## 2018-03-07 NOTE — Assessment & Plan Note (Signed)
Well controlled, no changes to meds. Encouraged heart healthy diet such as the DASH diet and exercise as tolerated.  °

## 2018-03-07 NOTE — Assessment & Plan Note (Signed)
Tolerating Enalapril will monitor

## 2018-03-07 NOTE — Assessment & Plan Note (Signed)
No dysuria or hematuria. Urinalysis unremarkable but will await urine culture. If all negative consider referral to urology for further consideration.

## 2018-03-08 LAB — URINE CULTURE
MICRO NUMBER:: 90319420
Result:: NO GROWTH
SPECIMEN QUALITY:: ADEQUATE

## 2018-03-21 DIAGNOSIS — Z08 Encounter for follow-up examination after completed treatment for malignant neoplasm: Secondary | ICD-10-CM | POA: Diagnosis not present

## 2018-03-21 DIAGNOSIS — Z85820 Personal history of malignant melanoma of skin: Secondary | ICD-10-CM | POA: Diagnosis not present

## 2018-03-21 DIAGNOSIS — L570 Actinic keratosis: Secondary | ICD-10-CM | POA: Diagnosis not present

## 2018-04-12 ENCOUNTER — Ambulatory Visit: Payer: Medicare Other | Admitting: Podiatry

## 2018-05-17 ENCOUNTER — Other Ambulatory Visit: Payer: Self-pay | Admitting: Family Medicine

## 2018-06-27 ENCOUNTER — Other Ambulatory Visit: Payer: Self-pay | Admitting: Family Medicine

## 2018-06-27 NOTE — Progress Notes (Deleted)
Subjective:   Jack Wade is a 77 y.o. male who presents for Medicare Annual/Subsequent preventive examination.  Review of Systems: No ROS.  Medicare Wellness Visit. Additional risk factors are reflected in the social history.   Sleep patterns:   Home Safety/Smoke Alarms: Feels safe in home. Smoke alarms in place.  Living environment; residence and Firearm Safety:   Male:   CCS- 03/19/[birth year]    PSA-  Lab Results  Component Value Date   PSA 0.71 03/07/2018       Objective:    Vitals: There were no vitals taken for this visit.  There is no height or weight on file to calculate BMI.  Advanced Directives 07/04/2017 10/01/2016 07/15/2016 05/16/2016 09/16/2015 06/11/2015 04/08/2015  Does Patient Have a Medical Advance Directive? Yes Yes Yes Yes Yes Yes Yes  Type of Paramedic of [place];Living will Living will;Healthcare Power of Attorney Living will - Healthcare Power of [place];Living will Living will  Does patient want to make changes to medical advance directive? - No - Patient declined - - Yes - information given No - Patient declined -  Copy of [place] in Chart? No - copy requested No - copy requested Yes - Yes Yes Yes    Tobacco Social History   Tobacco Use  Smoking Status Former Smoker  . Packs/day: 2.00  . Years: 30.00  . Pack years: 60.00  . Types: Cigarettes  . Start date: 12/27/1983  Smokeless Tobacco Never Used     Counseling given: Not Answered   Clinical Intake:                       Past Medical History:  Diagnosis Date  . Aortic calcification ([place]) 02/09/2015  . Appendicitis   . Back pain 02/09/2015  . CAD (coronary artery disease)    2 stents  . Chicken pox   . Diabetes mellitus type 2 in obese ([place]) 04/11/2016  . Diabetes type 2, controlled ([place])   . Dysphagia, unspecified(787.20) 03/20/2014  . Hereditary and idiopathic peripheral neuropathy 02/09/2015  .  Hyperlipidemia   . Hypertension   . Hypothyroidism   . Light headedness 07/04/2017  . Measles   . Medicare annual wellness visit, subsequent 06/14/2015  . Melanoma ([place])    Scalp. 2012  . Mumps as a child  . Thyroid disease    [place] Hyperthyroidism   Past Surgical History:  Procedure Laterality Date  . APPENDECTOMY    . BACK SURGERY     x2 lumbar  . BACK SURGERY     late 1990s had 2 surgeries, first surgery lifted a heavy engine ruptured disds, at L4 and L5, cleaned discs no hardware very helpful. 2 years later while recovering from angiogram with weights applied to left leg caused a recurrence and required surgery again in same area with disc repair  . CARDIAC CATHETERIZATION    . CARDIAC CATHETERIZATION N/A 05/11/2016   Procedure: Left Heart Cath and Coronary Angiography;  Surgeon: Belva Crome, MD;  Location: [facility];  Service: Cardiovascular;  Laterality: N/A;  . CARDIAC CATHETERIZATION N/A 05/11/2016   Procedure: Intravascular Pressure Wire/FFR Study;  Surgeon: Belva Crome, MD;  Location: [facility];  Service: Cardiovascular;  Laterality: N/A;  . CORONARY ANGIOPLASTY WITH STENT PLACEMENT     2 stents  . CORONARY ARTERY BYPASS GRAFT N/A 05/17/2016   Procedure: CORONARY ARTERY BYPASS GRAFTING (CABG) x 6 using  left mammory artery and right greater saphenous vein harvested endoscopically. [place] to LAD, SVG to Diagonal, SVG Sequential to OM2 and ramus intermediate, SVG Sequential to RCA and PDA;  Surgeon: [name], MD;  Location: [facility];  Service: Open Heart Surgery;  Laterality: N/A;  . KNEE ARTHROSCOPY     right knee  . LUMBAR LAMINECTOMY/DECOMPRESSION MICRODISCECTOMY Bilateral 04/10/2015   Procedure: LUMBAR LAMINECTOMY/DECOMPRESSION MICRODISCECTOMY 2 LEVELS;  Surgeon: Ashok Pall, MD;  Location: [facility];  Service: Neurosurgery;  Laterality: Bilateral;  Bilateral L45 L5S1 Laminectomy and Foraminotomy  . ROTATOR CUFF REPAIR Right   . SKIN CANCER  EXCISION     melanoma on scalp  . TEE WITHOUT CARDIOVERSION N/A 05/17/2016   Procedure: TRANSESOPHAGEAL ECHOCARDIOGRAM (TEE);  Surgeon: [name], MD;  Location: [facility];  Service: Open Heart Surgery;  Laterality: N/A;  . TONSILLECTOMY AND ADENOIDECTOMY    . WISDOM TOOTH EXTRACTION     Family History  Problem Relation Age of Onset  . Heart disease Father   . Emphysema Father   . Hypertension Father   . Cancer Father        lung/ healthy  . Heart disease Mother        Deceased  . Cancer Mother        breast  . Hypertension Mother   . Cancer Maternal Grandmother        colon  . Diabetes Brother        type 2  . Heart disease Brother   . Pulmonary embolism Son        vasculiti  . Depression Paternal Grandmother    Social History   Socioeconomic History  . Marital status: Married    Spouse name: Not on file  . Number of children: 2  . Years of education: Not on file  . Highest education level: Not on file  Occupational History  . Not on file  Social Needs  . Financial resource strain: Not on file  . Food insecurity:    Worry: Not on file    Inability: Not on file  . Transportation needs:    Medical: Not on file    Non-medical: Not on file  Tobacco Use  . Smoking status: Former Smoker    Packs/day: 2.00    Years: 30.00    Pack years: 60.00    Types: Cigarettes    Start date: 12/27/1983  . Smokeless tobacco: Never Used  Substance and Sexual Activity  . Alcohol use: Yes    Alcohol/week: 0.0 oz    Comment: occasionally  . Drug use: No  . 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   Lifestyle  . Physical activity:    Days per week: Not on file    Minutes per session: Not on file  . Stress: Not on file  Relationships  . Social connections:    Talks on phone: Not on file    Gets together: Not on file    Attends religious service: Not on file    Active member of club or organization: Not on file     Attends meetings of clubs or organizations: Not on file    Relationship status: Not on file  Other Topics Concern  . Not on file  Social History Narrative  . Not on file    Outpatient Encounter Medications as of 07/05/2018  Medication Sig  . aspirin 81 MG tablet Take 81 mg by mouth  daily.  . enalapril (VASOTEC) 2.5 MG tablet Take 1 tablet (2.5 mg total) by mouth daily.  Marland Kitchen glipiZIDE (GLUCOTROL) 5 MG tablet Take 1 tablet (5 mg total) by mouth daily before breakfast. 1 tab po in am and 1/2 tab po in pm (Patient taking differently: Take 5 mg by mouth daily before breakfast. 1 tab po in am and 1 tab po in pm)  . levothyroxine (SYNTHROID, LEVOTHROID) 100 MCG tablet TAKE 1 TABLET (100 MCG TOTAL) BY MOUTH DAILY BEFORE BREAKFAST.  . metFORMIN (GLUCOPHAGE-XR) 500 MG 24 hr tablet TAKE 2 TABLETS BY MOUTH TWICE A DAY  . ONE TOUCH LANCETS MISC Use as directed three times daily to check blood sugar.  DX E11.8  . ONETOUCH VERIO test strip USE AS DIRECTED ONCE DAILY TO CHECK BLOOD SUGAR. DX E11.8  . rosuvastatin (CRESTOR) 40 MG tablet Take 1 tablet (40 mg total) by mouth daily.   No facility-administered encounter medications on file as of 07/05/2018.     Activities of Daily Living In your present state of health, do you have any difficulty performing the following activities: 07/04/2017  Hearing? N  Vision? N  Difficulty concentrating or making decisions? N  Walking or climbing stairs? N  Dressing or bathing? N  Doing errands, shopping? N  Preparing Food and eating ? N  Using the Toilet? N  In the past six months, have you accidently leaked urine? N  Do you have problems with loss of bowel control? N  Managing your Medications? N  Managing your Finances? N  Housekeeping or managing your Housekeeping? N  Some recent data might be hidden    Patient Care Team: Mosie Lukes, MD as PCP - General (Family Medicine) Ashok Pall, MD as Consulting Physician (Neurosurgery) Stanford Breed Denice Bors, MD as  Consulting Physician (Cardiology)   Assessment:   This is a routine wellness examination for [place]. Physical assessment deferred to PCP.  Exercise Activities and Dietary recommendations   Diet (meal preparation, eat out, water intake, caffeinated beverages, dairy products, fruits and vegetables): {Desc; diets:16563} Breakfast: Lunch:  Dinner:      Goals    None      Fall Risk Fall Risk  07/04/2017 07/14/2016 07/14/2015  Falls in the past year? Yes No No  Number falls in past yr: 2 or more - -  Injury with Fall? No - -  Follow up Education provided;Falls prevention discussed - -   Depression Screen PHQ 2/9 Scores 07/04/2017 11/07/2016 07/18/2016 07/14/2015  PHQ - 2 Score 0 0 0 0    Cognitive Function MMSE - Mini Mental State Exam 07/04/2017  Orientation to time 5  Orientation to Place 5  Registration 3  Attention/ Calculation 5  Recall 3  Language- name 2 objects 2  Language- repeat 1  Language- follow 3 step command 3  Language- read & follow direction 1  Write a sentence 1  Copy design 1  Total score 30        Immunization History  Administered Date(s) Administered  . Influenza, High Dose Seasonal PF 08/30/2016, 09/23/2017  . Influenza,inj,Quad PF,6+ Mos 11/10/2014  . Influenza-Unspecified 11/28/2002, 10/21/2008, 09/25/2012  . Pneumococcal Polysaccharide-23 10/31/2013  . Pneumococcal-Unspecified 02/20/2006, 11/02/2012  . Td 06/10/1999  . Tdap 01/14/2011, 03/20/2014  . Zoster 01/14/2011   Screening Tests Health Maintenance  Topic Date Due  . PNA vac Low Risk Adult (2 of 2 - PCV13) 10/31/2014  . FOOT EXAM  09/16/2017  . OPHTHALMOLOGY EXAM  07/14/2018  . INFLUENZA  VACCINE  07/26/2018  . HEMOGLOBIN A1C  09/07/2018  . COLONOSCOPY  03/20/2023  . TETANUS/TDAP  03/20/2024        Plan:   ***  I have personally reviewed and noted the following in the patient's chart:   . Medical and social history . Use of alcohol, tobacco or illicit drugs  . Current  medications and supplements . Functional ability and status . Nutritional status . Physical activity . Advanced directives . List of other physicians . Hospitalizations, surgeries, and ER visits in previous 12 months . Vitals . Screenings to include cognitive, depression, and falls . Referrals and appointments  In addition, I have reviewed and discussed with patient certain preventive protocols, quality metrics, and best practice recommendations. A written personalized care plan for preventive services as well as general preventive health recommendations were provided to patient.     Shela Nevin, [place]  06/27/2018

## 2018-07-05 ENCOUNTER — Ambulatory Visit: Payer: Medicare Other | Admitting: *Deleted

## 2018-07-08 ENCOUNTER — Other Ambulatory Visit: Payer: Self-pay | Admitting: Family Medicine

## 2018-08-16 DIAGNOSIS — H524 Presbyopia: Secondary | ICD-10-CM | POA: Diagnosis not present

## 2018-08-16 DIAGNOSIS — E119 Type 2 diabetes mellitus without complications: Secondary | ICD-10-CM | POA: Diagnosis not present

## 2018-08-16 DIAGNOSIS — H5203 Hypermetropia, bilateral: Secondary | ICD-10-CM | POA: Diagnosis not present

## 2018-08-16 DIAGNOSIS — H11043 Peripheral pterygium, stationary, bilateral: Secondary | ICD-10-CM | POA: Diagnosis not present

## 2018-08-16 DIAGNOSIS — H2513 Age-related nuclear cataract, bilateral: Secondary | ICD-10-CM | POA: Diagnosis not present

## 2018-08-16 DIAGNOSIS — H02052 Trichiasis without entropian right lower eyelid: Secondary | ICD-10-CM | POA: Diagnosis not present

## 2018-08-21 ENCOUNTER — Ambulatory Visit: Payer: Medicare Other | Admitting: *Deleted

## 2018-08-21 NOTE — Progress Notes (Signed)
Subjective:   Jack Wade is a 77 y.o. male who presents for Medicare Annual/Subsequent preventive examination.  Review of Systems:  No ROS.  Medicare Wellness Visit. Additional risk factors are reflected in the social history.  Cardiac Risk Factors include: advanced age (>32men, >96 women);diabetes mellitus;dyslipidemia;hypertension;male gender Sleep patterns: Sleeps 6-9 hrs. Feels rested.  Living environment: Lives with wife in 2 story home.    Male:   CCS-   Last 03/19/13 PSA- done 03/07/2018 Lab Results  Component Value Date   PSA 0.71 03/07/2018   Eye- Dr.Digby. UTD per pt    Objective:    Vitals: BP 126/72 (BP Location: Left Arm, Patient Position: Sitting, Cuff Size: Normal)   Pulse 63   Ht 6' (1.829 m)   Wt 191 lb 12.8 oz (87 kg)   SpO2 97%   BMI 26.01 kg/m   Body mass index is 26.01 kg/m.  Advanced Directives 08/24/2018 07/04/2017 10/01/2016 07/15/2016 05/16/2016 09/16/2015 06/11/2015  Does Patient Have a Medical Advance Directive? Yes Yes Yes Yes Yes Yes Yes  Type of Paramedic of [place];Living will [place];Living will Living will;Healthcare Power of Attorney Living will - Healthcare Power of [place];Living will  Does patient want to make changes to medical advance directive? - - No - Patient declined - - Yes - information given No - Patient declined  Copy of [place] in Chart? Yes No - copy requested No - copy requested Yes - Yes Yes    Tobacco Social History   Tobacco Use  Smoking Status Former Smoker  . Packs/day: 2.00  . Years: 30.00  . Pack years: 60.00  . Types: Cigarettes  . Start date: 12/27/1983  Smokeless Tobacco Never Used     Counseling given: Not Answered   Clinical Intake: Pain : No/denies pain   Past Medical History:  Diagnosis Date  . Aortic calcification ([place]) 02/09/2015  . Appendicitis   . Back pain 02/09/2015  . CAD (coronary artery  disease)    2 stents  . Chicken pox   . Diabetes mellitus type 2 in obese ([place]) 04/11/2016  . Diabetes type 2, controlled ([place])   . Dysphagia, unspecified(787.20) 03/20/2014  . Hereditary and idiopathic peripheral neuropathy 02/09/2015  . Hyperlipidemia   . Hypertension   . Hypothyroidism   . Light headedness 07/04/2017  . Measles   . Medicare annual wellness visit, subsequent 06/14/2015  . Melanoma ([place])    Scalp. 2012  . Mumps as a child  . Thyroid disease    [place] Hyperthyroidism   Past Surgical History:  Procedure Laterality Date  . APPENDECTOMY    . BACK SURGERY     x2 lumbar  . BACK SURGERY     late 1990s had 2 surgeries, first surgery lifted a heavy engine ruptured disds, at L4 and L5, cleaned discs no hardware very helpful. 2 years later while recovering from angiogram with weights applied to left leg caused a recurrence and required surgery again in same area with disc repair  . CARDIAC CATHETERIZATION    . CARDIAC CATHETERIZATION N/A 05/11/2016   Procedure: Left Heart Cath and Coronary Angiography;  Surgeon: Belva Crome, MD;  Location: [facility];  Service: Cardiovascular;  Laterality: N/A;  . CARDIAC CATHETERIZATION N/A 05/11/2016   Procedure: Intravascular Pressure Wire/FFR Study;  Surgeon: Belva Crome, MD;  Location: [facility];  Service: Cardiovascular;  Laterality: N/A;  . CORONARY ANGIOPLASTY WITH  STENT PLACEMENT     2 stents  . CORONARY ARTERY BYPASS GRAFT N/A 05/17/2016   Procedure: CORONARY ARTERY BYPASS GRAFTING (CABG) x 6 using left mammory artery and right greater saphenous vein harvested endoscopically. [place] to LAD, SVG to Diagonal, SVG Sequential to OM2 and ramus intermediate, SVG Sequential to RCA and PDA;  Surgeon: [name], MD;  Location: [facility];  Service: Open Heart Surgery;  Laterality: N/A;  . KNEE ARTHROSCOPY     right knee  . LUMBAR LAMINECTOMY/DECOMPRESSION MICRODISCECTOMY Bilateral 04/10/2015   Procedure: LUMBAR  LAMINECTOMY/DECOMPRESSION MICRODISCECTOMY 2 LEVELS;  Surgeon: Ashok Pall, MD;  Location: [facility];  Service: Neurosurgery;  Laterality: Bilateral;  Bilateral L45 L5S1 Laminectomy and Foraminotomy  . ROTATOR CUFF REPAIR Right   . SKIN CANCER EXCISION     melanoma on scalp  . TEE WITHOUT CARDIOVERSION N/A 05/17/2016   Procedure: TRANSESOPHAGEAL ECHOCARDIOGRAM (TEE);  Surgeon: [name], MD;  Location: [facility];  Service: Open Heart Surgery;  Laterality: N/A;  . TONSILLECTOMY AND ADENOIDECTOMY    . WISDOM TOOTH EXTRACTION     Family History  Problem Relation Age of Onset  . Heart disease Father   . Emphysema Father   . Hypertension Father   . Cancer Father        lung/ healthy  . Heart disease Mother        Deceased  . Cancer Mother        breast  . Hypertension Mother   . Cancer Maternal Grandmother        colon  . Diabetes Brother        type 2  . Heart disease Brother   . Pulmonary embolism Son        vasculiti  . Depression Paternal Grandmother    Social History   Socioeconomic History  . Marital status: Married    Spouse name: Not on file  . Number of children: 2  . Years of education: Not on file  . Highest education level: Not on file  Occupational History  . Not on file  Social Needs  . Financial resource strain: Not on file  . Food insecurity:    Worry: Not on file    Inability: Not on file  . Transportation needs:    Medical: Not on file    Non-medical: Not on file  Tobacco Use  . Smoking status: Former Smoker    Packs/day: 2.00    Years: 30.00    Pack years: 60.00    Types: Cigarettes    Start date: 12/27/1983  . Smokeless tobacco: Never Used  Substance and Sexual Activity  . Alcohol use: Yes    Alcohol/week: 0.0 standard drinks    Comment: occasionally  . Drug use: No  . 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   Lifestyle  . Physical activity:    Days per week: Not on file     Minutes per session: Not on file  . Stress: Not on file  Relationships  . Social connections:    Talks on phone: Not on file    Gets together: Not on file    Attends religious service: Not on file    Active member of club or organization: Not on file    Attends meetings of clubs or organizations: Not on file    Relationship status: Not on file  Other Topics Concern  . Not on file  Social History Narrative  .  Not on file    Outpatient Encounter Medications as of 08/24/2018  Medication Sig  . aspirin 81 MG tablet Take 81 mg by mouth daily.  . enalapril (VASOTEC) 2.5 MG tablet Take 1 tablet (2.5 mg total) by mouth daily.  Marland Kitchen glipiZIDE (GLUCOTROL) 5 MG tablet Take 1 tablet (5 mg total) by mouth daily before breakfast. 1 tab po in am and 1/2 tab po in pm (Patient taking differently: Take 5 mg by mouth daily before breakfast. 1 tab po in am and 1 tab po in pm)  . levothyroxine (SYNTHROID, LEVOTHROID) 100 MCG tablet TAKE 1 TABLET (100 MCG TOTAL) BY MOUTH DAILY BEFORE BREAKFAST.  . metFORMIN (GLUCOPHAGE-XR) 500 MG 24 hr tablet TAKE 2 TABLETS BY MOUTH TWICE A DAY  . ONE TOUCH LANCETS MISC Use as directed three times daily to check blood sugar.  DX E11.8  . ONETOUCH VERIO test strip USE AS DIRECTED ONCE DAILY TO CHECK BLOOD SUGAR. DX E11.8  . rosuvastatin (CRESTOR) 40 MG tablet Take 1 tablet (40 mg total) by mouth daily.   No facility-administered encounter medications on file as of 08/24/2018.     Activities of Daily Living In your present state of health, do you have any difficulty performing the following activities: 08/24/2018  Hearing? N  Vision? N  Difficulty concentrating or making decisions? N  Walking or climbing stairs? N  Dressing or bathing? N  Doing errands, shopping? N  Preparing Food and eating ? N  Using the Toilet? N  In the past six months, have you accidently leaked urine? N  Do you have problems with loss of bowel control? N  Managing your Medications? N  Managing  your Finances? N  Housekeeping or managing your Housekeeping? N  Some recent data might be hidden    Patient Care Team: Mosie Lukes, MD as PCP - General (Family Medicine) Ashok Pall, MD as Consulting Physician (Neurosurgery) Stanford Breed Denice Bors, MD as Consulting Physician (Cardiology)   Assessment:   This is a routine wellness examination for K[place]. Physical assessment deferred to PCP.   Exercise Activities and Dietary recommendations Current Exercise Habits: The patient does not participate in regular exercise at present, Exercise limited by: None identified   Diet (meal preparation, eat out, water intake, caffeinated beverages, dairy products, fruits and vegetables): 24 hour recall Breakfast:cereal Lunch: sausage dog Dinner:  Roast and vegetable.    Goals    . Maintain current healthy lifestyle. (pt-stated)       Fall Risk Fall Risk  08/24/2018 07/04/2017 07/14/2016 07/14/2015  Falls in the past year? Yes Yes No No  Number falls in past yr: 1 2 or more - -  Injury with Fall? Yes No - -  Follow up Education provided;Falls prevention discussed Education provided;Falls prevention discussed - -      Depression Screen PHQ 2/9 Scores 08/24/2018 07/04/2017 11/07/2016 07/18/2016  PHQ - 2 Score 0 0 0 0    Cognitive Function MMSE - Mini Mental State Exam 07/04/2017  Orientation to time 5  Orientation to Place 5  Registration 3  Attention/ Calculation 5  Recall 3  Language- name 2 objects 2  Language- repeat 1  Language- follow 3 step command 3  Language- read & follow direction 1  Write a sentence 1  Copy design 1  Total score 30        Immunization History  Administered Date(s) Administered  . Influenza, High Dose Seasonal PF 08/30/2016, 09/23/2017  . Influenza,inj,Quad PF,6+ Mos 11/10/2014  .  Influenza-Unspecified 11/28/2002, 10/21/2008, 09/25/2012  . Pneumococcal Polysaccharide-23 10/31/2013  . Pneumococcal-Unspecified 02/20/2006, 11/02/2012  . Td  06/10/1999  . Tdap 01/14/2011, 03/20/2014  . Zoster 01/14/2011   Screening Tests Health Maintenance  Topic Date Due  . PNA vac Low Risk Adult (2 of 2 - PCV13) 10/31/2014  . FOOT EXAM  09/16/2017  . OPHTHALMOLOGY EXAM  07/14/2018  . INFLUENZA VACCINE  07/26/2018  . HEMOGLOBIN A1C  09/07/2018  . COLONOSCOPY  03/20/2023  . TETANUS/TDAP  03/20/2024        Plan:    Please schedule your next medicare wellness visit with me in 1 yr.  Schedule appointment to follow up with Dr.Blyth.  Continue to eat heart healthy diet (full of fruits, vegetables, whole grains, lean protein, water--limit salt, fat, and sugar intake) and increase physical activity as tolerated.  Continue doing brain stimulating activities (puzzles, reading, adult coloring books, staying active) to keep memory sharp.     I have personally reviewed and noted the following in the patient's chart:   . Medical and social history . Use of alcohol, tobacco or illicit drugs  . Current medications and supplements . Functional ability and status . Nutritional status . Physical activity . Advanced directives . List of other physicians . Hospitalizations, surgeries, and ER visits in previous 12 months . Vitals . Screenings to include cognitive, depression, and falls . Referrals and appointments  In addition, I have reviewed and discussed with patient certain preventive protocols, quality metrics, and best practice recommendations. A written personalized care plan for preventive services as well as general preventive health recommendations were provided to patient.     Shela Nevin, [place]  08/24/2018

## 2018-08-24 ENCOUNTER — Ambulatory Visit (INDEPENDENT_AMBULATORY_CARE_PROVIDER_SITE_OTHER): Payer: Medicare Other | Admitting: *Deleted

## 2018-08-24 ENCOUNTER — Encounter: Payer: Self-pay | Admitting: *Deleted

## 2018-08-24 VITALS — BP 126/72 | HR 63 | Ht 72.0 in | Wt 191.8 lb

## 2018-08-24 DIAGNOSIS — Z0000 Encounter for general adult medical examination without abnormal findings: Secondary | ICD-10-CM

## 2018-08-24 NOTE — Progress Notes (Signed)
Noted. Agree with above.  [place], DO 08/24/18 3:41 PM

## 2018-08-24 NOTE — Patient Instructions (Signed)
Please schedule your next medicare wellness visit with me in 1 yr.  Schedule appointment to follow up with Dr.Blyth.  Continue to eat heart healthy diet (full of fruits, vegetables, whole grains, lean protein, water--limit salt, fat, and sugar intake) and increase physical activity as tolerated.  Continue doing brain stimulating activities (puzzles, reading, adult coloring books, staying active) to keep memory sharp.    Jack Wade , Thank you for taking time to come for your Medicare Wellness Visit. I appreciate your ongoing commitment to your health goals. Please review the following plan we discussed and let me know if I can assist you in the future.   These are the goals we discussed: Goals    . Maintain current healthy lifestyle. (pt-stated)       This is a list of the screening recommended for you and due dates:  Health Maintenance  Topic Date Due  . Pneumonia vaccines (2 of 2 - PCV13) 10/31/2014  . Complete foot exam   09/16/2017  . Eye exam for diabetics  07/14/2018  . Flu Shot  07/26/2018  . Hemoglobin A1C  09/07/2018  . Colon Cancer Screening  03/20/2023  . Tetanus Vaccine  03/20/2024    Health Maintenance, Male A healthy lifestyle and preventive care is important for your health and wellness. Ask your health care provider about what schedule of regular examinations is right for you. What should I know about weight and diet? Eat a Healthy Diet  Eat plenty of vegetables, fruits, whole grains, low-fat dairy products, and lean protein.  Do not eat a lot of foods high in solid fats, added sugars, or salt.  Maintain a Healthy Weight Regular exercise can help you achieve or maintain a healthy weight. You should:  Do at least 150 minutes of exercise each week. The exercise should increase your heart rate and make you sweat (moderate-intensity exercise).  Do strength-training exercises at least twice a week.  Watch Your Levels of Cholesterol and Blood Lipids  Have  your blood tested for lipids and cholesterol every 5 years starting at 76 years of age. If you are at high risk for heart disease, you should start having your blood tested when you are 76 years old. You may need to have your cholesterol levels checked more often if: ? Your lipid or cholesterol levels are high. ? You are older than 76 years of age. ? You are at high risk for heart disease.  What should I know about cancer screening? Many types of cancers can be detected early and may often be prevented. Lung Cancer  You should be screened every year for lung cancer if: ? You are a current smoker who has smoked for at least 30 years. ? You are a former smoker who has quit within the past 15 years.  Talk to your health care provider about your screening options, when you should start screening, and how often you should be screened.  Colorectal Cancer  Routine colorectal cancer screening usually begins at 76 years of age and should be repeated every 5-10 years until you are 76 years old. You may need to be screened more often if early forms of precancerous polyps or small growths are found. Your health care provider may recommend screening at an earlier age if you have risk factors for colon cancer.  Your health care provider may recommend using home test kits to check for hidden blood in the stool.  A small camera at the end of a tube can  be used to examine your colon (sigmoidoscopy or colonoscopy). This checks for the earliest forms of colorectal cancer.  Prostate and Testicular Cancer  Depending on your age and overall health, your health care provider may do certain tests to screen for prostate and testicular cancer.  Talk to your health care provider about any symptoms or concerns you have about testicular or prostate cancer.  Skin Cancer  Check your skin from head to toe regularly.  Tell your health care provider about any new moles or changes in moles, especially if: ? There is  a change in a mole's size, shape, or color. ? You have a mole that is larger than a pencil eraser.  Always use sunscreen. Apply sunscreen liberally and repeat throughout the day.  Protect yourself by wearing long sleeves, pants, a wide-brimmed hat, and sunglasses when outside.  What should I know about heart disease, diabetes, and high blood pressure?  If you are 60-16 years of age, have your blood pressure checked every 3-5 years. If you are 19 years of age or older, have your blood pressure checked every year. You should have your blood pressure measured twice-once when you are at a hospital or clinic, and once when you are not at a hospital or clinic. Record the average of the two measurements. To check your blood pressure when you are not at a hospital or clinic, you can use: ? An automated blood pressure machine at a pharmacy. ? A home blood pressure monitor.  Talk to your health care provider about your target blood pressure.  If you are between 63-52 years old, ask your health care provider if you should take aspirin to prevent heart disease.  Have regular diabetes screenings by checking your fasting blood sugar level. ? If you are at a normal weight and have a low risk for diabetes, have this test once every three years after the age of 80. ? If you are overweight and have a high risk for diabetes, consider being tested at a younger age or more often.  A one-time screening for abdominal aortic aneurysm (AAA) by ultrasound is recommended for men aged 74-75 years who are current or former smokers. What should I know about preventing infection? Hepatitis B If you have a higher risk for hepatitis B, you should be screened for this virus. Talk with your health care provider to find out if you are at risk for hepatitis B infection. Hepatitis C Blood testing is recommended for:  Everyone born from [birth year] through 1964.  Anyone with known risk factors for hepatitis C.  Sexually  Transmitted Diseases (STDs)  You should be screened each year for STDs including gonorrhea and chlamydia if: ? You are sexually active and are younger than 76 years of age. ? You are older than 76 years of age and your health care provider tells you that you are at risk for this type of infection. ? Your sexual activity has changed since you were last screened and you are at an increased risk for chlamydia or gonorrhea. Ask your health care provider if you are at risk.  Talk with your health care provider about whether you are at high risk of being infected with HIV. Your health care provider may recommend a prescription medicine to help prevent HIV infection.  What else can I do?  Schedule regular health, dental, and eye exams.  Stay current with your vaccines (immunizations).  Do not use any tobacco products, such as cigarettes, chewing tobacco, and e-cigarettes.  If you need help quitting, ask your health care provider.  Limit alcohol intake to no more than 2 drinks per day. One drink equals 12 ounces of beer, 5 ounces of wine, or 1 ounces of hard liquor.  Do not use street drugs.  Do not share needles.  Ask your health care provider for help if you need support or information about quitting drugs.  Tell your health care provider if you often feel depressed.  Tell your health care provider if you have ever been abused or do not feel safe at home. This information is not intended to replace advice given to you by your health care provider. Make sure you discuss any questions you have with your health care provider. Document Released: 06/09/2008 Document Revised: 08/10/2016 Document Reviewed: 09/15/2015 Elsevier Interactive Patient Education  [employer].

## 2018-08-28 ENCOUNTER — Encounter: Payer: Self-pay | Admitting: Family Medicine

## 2018-08-28 LAB — HM DIABETES EYE EXAM

## 2018-09-18 ENCOUNTER — Encounter: Payer: Self-pay | Admitting: Family Medicine

## 2018-09-18 ENCOUNTER — Ambulatory Visit (INDEPENDENT_AMBULATORY_CARE_PROVIDER_SITE_OTHER): Payer: Medicare Other | Admitting: Family Medicine

## 2018-09-18 VITALS — BP 122/70 | HR 68 | Temp 98.1°F | Resp 16 | Ht 72.0 in | Wt 193.8 lb

## 2018-09-18 DIAGNOSIS — N183 Chronic kidney disease, stage 3 unspecified: Secondary | ICD-10-CM

## 2018-09-18 DIAGNOSIS — E118 Type 2 diabetes mellitus with unspecified complications: Secondary | ICD-10-CM | POA: Diagnosis not present

## 2018-09-18 DIAGNOSIS — I2510 Atherosclerotic heart disease of native coronary artery without angina pectoris: Secondary | ICD-10-CM

## 2018-09-18 DIAGNOSIS — E785 Hyperlipidemia, unspecified: Secondary | ICD-10-CM | POA: Diagnosis not present

## 2018-09-18 DIAGNOSIS — K5900 Constipation, unspecified: Secondary | ICD-10-CM | POA: Insufficient documentation

## 2018-09-18 DIAGNOSIS — E038 Other specified hypothyroidism: Secondary | ICD-10-CM

## 2018-09-18 DIAGNOSIS — Z23 Encounter for immunization: Secondary | ICD-10-CM

## 2018-09-18 DIAGNOSIS — I10 Essential (primary) hypertension: Secondary | ICD-10-CM | POA: Diagnosis not present

## 2018-09-18 LAB — COMPREHENSIVE METABOLIC PANEL
ALT: 18 U/L (ref 0–53)
AST: 23 U/L (ref 0–37)
Albumin: 4.4 g/dL (ref 3.5–5.2)
Alkaline Phosphatase: 69 U/L (ref 39–117)
BUN: 15 mg/dL (ref 6–23)
CO2: 26 mEq/L (ref 19–32)
Calcium: 9.7 mg/dL (ref 8.4–10.5)
Chloride: 107 mEq/L (ref 96–112)
Creatinine, Ser: 1.26 mg/dL (ref 0.40–1.50)
GFR: 59.17 mL/min — ABNORMAL LOW (ref >60.00)
Glucose, Bld: 155 mg/dL — ABNORMAL HIGH (ref 70–99)
Potassium: 4.5 mEq/L (ref 3.5–5.1)
Sodium: 139 mEq/L (ref 135–145)
Total Bilirubin: 0.4 mg/dL (ref 0.2–1.2)
Total Protein: 7.2 g/dL (ref 6.0–8.3)

## 2018-09-18 LAB — CBC
HCT: 41.1 % (ref 39.0–52.0)
Hemoglobin: 13.8 g/dL (ref 13.0–17.0)
MCHC: 33.6 g/dL (ref 30.0–36.0)
MCV: 83.4 fl (ref 78.0–100.0)
Platelets: 200 10*3/uL (ref 150.0–400.0)
RBC: 4.93 Mil/uL (ref 4.22–5.81)
RDW: 14.5 % (ref 11.5–15.5)
WBC: 6.7 10*3/uL (ref 4.0–10.5)

## 2018-09-18 LAB — MICROALBUMIN / CREATININE URINE RATIO
Creatinine,U: 186.5 mg/dL
Microalb Creat Ratio: 0.7 mg/g (ref 0.0–30.0)
Microalb, Ur: 1.3 mg/dL (ref 0.0–1.9)

## 2018-09-18 LAB — HEMOGLOBIN A1C: Hgb A1c MFr Bld: 7.3 % — ABNORMAL HIGH (ref 4.6–6.5)

## 2018-09-18 LAB — LIPID PANEL
Cholesterol: 87 mg/dL (ref 0–200)
HDL: 45.3 mg/dL (ref >39.00)
LDL Cholesterol: 25 mg/dL (ref 0–99)
NonHDL: 42.01
Total CHOL/HDL Ratio: 2
Triglycerides: 86 mg/dL (ref 0.0–149.0)
VLDL: 17.2 mg/dL (ref 0.0–40.0)

## 2018-09-18 LAB — TSH: TSH: 0.36 u[IU]/mL (ref 0.35–4.50)

## 2018-09-18 NOTE — Assessment & Plan Note (Signed)
Check cmp 

## 2018-09-18 NOTE — Assessment & Plan Note (Signed)
On Levothyroxine, continue to monitor 

## 2018-09-18 NOTE — Patient Instructions (Addendum)
Shingrix is the new shingles shot, 2 shots over 2-6 months at pharmacy  Encouraged increased hydration and fiber in diet. Daily probiotics. If bowels not moving can use MOM 2 tbls po in 4 oz of warm prune juice by mouth every 2-3 days. If no results then repeat in 4 hours with  Dulcolax suppository pr, may repeat again in 4 more hours as needed. Seek care if symptoms worsen. Consider daily Miralax and/or Dulcolax if symptoms persist.  Miralax and Benefiber together once to twice daily   Hypertension Hypertension, commonly called high blood pressure, is when the force of blood pumping through the arteries is too strong. The arteries are the blood vessels that carry blood from the heart throughout the body. Hypertension forces the heart to work harder to pump blood and may cause arteries to become narrow or stiff. Having untreated or uncontrolled hypertension can cause heart attacks, strokes, kidney disease, and other problems. A blood pressure reading consists of a higher number over a lower number. Ideally, your blood pressure should be below 120/80. The first ("top") number is called the systolic pressure. It is a measure of the pressure in your arteries as your heart beats. The second ("bottom") number is called the diastolic pressure. It is a measure of the pressure in your arteries as the heart relaxes. What are the causes? The cause of this condition is not known. What increases the risk? Some risk factors for high blood pressure are under your control. Others are not. Factors you can change  Smoking.  Having type 2 diabetes mellitus, high cholesterol, or both.  Not getting enough exercise or physical activity.  Being overweight.  Having too much fat, sugar, calories, or salt (sodium) in your diet.  Drinking too much alcohol. Factors that are difficult or impossible to change  Having chronic kidney disease.  Having a family history of high blood pressure.  Age. Risk increases  with age.  Race. You may be at higher risk if you are African-American.  Gender. Men are at higher risk than women before age 15. After age 39, women are at higher risk than men.  Having obstructive sleep apnea.  Stress. What are the signs or symptoms? Extremely high blood pressure (hypertensive crisis) may cause:  Headache.  Anxiety.  Shortness of breath.  Nosebleed.  Nausea and vomiting.  Severe chest pain.  Jerky movements you cannot control (seizures).  How is this diagnosed? This condition is diagnosed by measuring your blood pressure while you are seated, with your arm resting on a surface. The cuff of the blood pressure monitor will be placed directly against the skin of your upper arm at the level of your heart. It should be measured at least twice using the same arm. Certain conditions can cause a difference in blood pressure between your right and left arms. Certain factors can cause blood pressure readings to be lower or higher than normal (elevated) for a short period of time:  When your blood pressure is higher when you are in a health care provider's office than when you are at home, this is called white coat hypertension. Most people with this condition do not need medicines.  When your blood pressure is higher at home than when you are in a health care provider's office, this is called masked hypertension. Most people with this condition may need medicines to control blood pressure.  If you have a high blood pressure reading during one visit or you have normal blood pressure with other  risk factors:  You may be asked to return on a different day to have your blood pressure checked again.  You may be asked to monitor your blood pressure at home for 1 week or longer.  If you are diagnosed with hypertension, you may have other blood or imaging tests to help your health care provider understand your overall risk for other conditions. How is this treated? This  condition is treated by making healthy lifestyle changes, such as eating healthy foods, exercising more, and reducing your alcohol intake. Your health care provider may prescribe medicine if lifestyle changes are not enough to get your blood pressure under control, and if:  Your systolic blood pressure is above 130.  Your diastolic blood pressure is above 80.  Your personal target blood pressure may vary depending on your medical conditions, your age, and other factors. Follow these instructions at home: Eating and drinking  Eat a diet that is high in fiber and potassium, and low in sodium, added sugar, and fat. An example eating plan is called the DASH (Dietary Approaches to Stop Hypertension) diet. To eat this way: ? Eat plenty of fresh fruits and vegetables. Try to fill half of your plate at each meal with fruits and vegetables. ? Eat whole grains, such as whole wheat pasta, brown rice, or whole grain bread. Fill about one quarter of your plate with whole grains. ? Eat or drink low-fat dairy products, such as skim milk or low-fat yogurt. ? Avoid fatty cuts of meat, processed or cured meats, and poultry with skin. Fill about one quarter of your plate with lean proteins, such as fish, chicken without skin, beans, eggs, and tofu. ? Avoid premade and processed foods. These tend to be higher in sodium, added sugar, and fat.  Reduce your daily sodium intake. Most people with hypertension should eat less than 1,500 mg of sodium a day.  Limit alcohol intake to no more than 1 drink a day for nonpregnant women and 2 drinks a day for men. One drink equals 12 oz of beer, 5 oz of wine, or 1 oz of hard liquor. Lifestyle  Work with your health care provider to maintain a healthy body weight or to lose weight. Ask what an ideal weight is for you.  Get at least 30 minutes of exercise that causes your heart to beat faster (aerobic exercise) most days of the week. Activities may include walking, swimming,  or biking.  Include exercise to strengthen your muscles (resistance exercise), such as pilates or lifting weights, as part of your weekly exercise routine. Try to do these types of exercises for 30 minutes at least 3 days a week.  Do not use any products that contain nicotine or tobacco, such as cigarettes and e-cigarettes. If you need help quitting, ask your health care provider.  Monitor your blood pressure at home as told by your health care provider.  Keep all follow-up visits as told by your health care provider. This is important. Medicines  Take over-the-counter and prescription medicines only as told by your health care provider. Follow directions carefully. Blood pressure medicines must be taken as prescribed.  Do not skip doses of blood pressure medicine. Doing this puts you at risk for problems and can make the medicine less effective.  Ask your health care provider about side effects or reactions to medicines that you should watch for. Contact a health care provider if:  You think you are having a reaction to a medicine you are taking.  You have headaches that keep coming back (recurring).  You feel dizzy.  You have swelling in your ankles.  You have trouble with your vision. Get help right away if:  You develop a severe headache or confusion.  You have unusual weakness or numbness.  You feel faint.  You have severe pain in your chest or abdomen.  You vomit repeatedly.  You have trouble breathing. Summary  Hypertension is when the force of blood pumping through your arteries is too strong. If this condition is not controlled, it may put you at risk for serious complications.  Your personal target blood pressure may vary depending on your medical conditions, your age, and other factors. For most people, a normal blood pressure is less than 120/80.  Hypertension is treated with lifestyle changes, medicines, or a combination of both. Lifestyle changes include  weight loss, eating a healthy, low-sodium diet, exercising more, and limiting alcohol. This information is not intended to replace advice given to you by your health care provider. Make sure you discuss any questions you have with your health care provider. Document Released: 12/12/2005 Document Revised: 11/09/2016 Document Reviewed: 11/09/2016 Elsevier Interactive Patient Education  [employer].

## 2018-09-18 NOTE — Assessment & Plan Note (Signed)
Well controlled, no changes to meds. Encouraged heart healthy diet such as the DASH diet and exercise as tolerated.  °

## 2018-09-18 NOTE — Assessment & Plan Note (Signed)
hgba1c acceptable, minimize simple carbs. Increase exercise as tolerated.  

## 2018-09-23 NOTE — Assessment & Plan Note (Signed)
Encouraged increased hydration and fiber in diet. Daily probiotics. If bowels not moving can use MOM 2 tbls po in 4 oz of warm prune juice by mouth every 2-3 days. If no results then repeat in 4 hours with  Dulcolax suppository pr, may repeat again in 4 more hours as needed. Seek care if symptoms worsen. Consider daily Miralax and/or Dulcolax if symptoms persist. Has worsened quickly so will refer to gastroenterology for further consideration.

## 2018-09-23 NOTE — Progress Notes (Signed)
Subjective:    Patient ID: Jack Wade, male    DOB: [birth year], 77 y.o.   MRN: [id number]  Chief Complaint  Patient presents with  . Hypertension    follow up, medication management  . Constipation    HPI Patient is in today for follow up. He is noting his sugars have been well controlled. He has had some low sugars down into the 60s but only once or twice a month. FSG this morning was 101. He does note his bowels have slowed down and is now only moving bowels once a week or so. No bloody or tarry stool. No significant abdominal pain, fevers or chills. No nausea or vomiting. Denies CP/palp/SOB/HA/congestion/fevers or GU c/o. Taking meds as prescribed  Past Medical History:  Diagnosis Date  . Aortic calcification ([place]) 02/09/2015  . Appendicitis   . Back pain 02/09/2015  . CAD (coronary artery disease)    2 stents  . Chicken pox   . Diabetes mellitus type 2 in obese ([place]) 04/11/2016  . Diabetes type 2, controlled ([place])   . Dysphagia, unspecified(787.20) 03/20/2014  . Hereditary and idiopathic peripheral neuropathy 02/09/2015  . Hyperlipidemia   . Hypertension   . Hypothyroidism   . Light headedness 07/04/2017  . Measles   . Medicare annual wellness visit, subsequent 06/14/2015  . Melanoma ([place])    Scalp. 2012  . Mumps as a child  . Thyroid disease    [place] Hyperthyroidism    Past Surgical History:  Procedure Laterality Date  . APPENDECTOMY    . BACK SURGERY     x2 lumbar  . BACK SURGERY     late 1990s had 2 surgeries, first surgery lifted a heavy engine ruptured disds, at L4 and L5, cleaned discs no hardware very helpful. 2 years later while recovering from angiogram with weights applied to left leg caused a recurrence and required surgery again in same area with disc repair  . CARDIAC CATHETERIZATION    . CARDIAC CATHETERIZATION N/A 05/11/2016   Procedure: Left Heart Cath and Coronary Angiography;  Surgeon: Belva Crome, MD;  Location: [facility];   Service: Cardiovascular;  Laterality: N/A;  . CARDIAC CATHETERIZATION N/A 05/11/2016   Procedure: Intravascular Pressure Wire/FFR Study;  Surgeon: Belva Crome, MD;  Location: [facility];  Service: Cardiovascular;  Laterality: N/A;  . CORONARY ANGIOPLASTY WITH STENT PLACEMENT     2 stents  . CORONARY ARTERY BYPASS GRAFT N/A 05/17/2016   Procedure: CORONARY ARTERY BYPASS GRAFTING (CABG) x 6 using left mammory artery and right greater saphenous vein harvested endoscopically. Winnie to LAD, SVG to Diagonal, SVG Sequential to OM2 and ramus intermediate, SVG Sequential to RCA and PDA;  Surgeon: [name], MD;  Location: [facility];  Service: Open Heart Surgery;  Laterality: N/A;  . KNEE ARTHROSCOPY     right knee  . LUMBAR LAMINECTOMY/DECOMPRESSION MICRODISCECTOMY Bilateral 04/10/2015   Procedure: LUMBAR LAMINECTOMY/DECOMPRESSION MICRODISCECTOMY 2 LEVELS;  Surgeon: Ashok Pall, MD;  Location: [facility];  Service: Neurosurgery;  Laterality: Bilateral;  Bilateral L45 L5S1 Laminectomy and Foraminotomy  . ROTATOR CUFF REPAIR Right   . SKIN CANCER EXCISION     melanoma on scalp  . TEE WITHOUT CARDIOVERSION N/A 05/17/2016   Procedure: TRANSESOPHAGEAL ECHOCARDIOGRAM (TEE);  Surgeon: [name], MD;  Location: [facility];  Service: Open Heart Surgery;  Laterality: N/A;  . TONSILLECTOMY AND ADENOIDECTOMY    . WISDOM TOOTH EXTRACTION      Family History  Problem Relation Age of Onset  . Heart disease Father   . Emphysema Father   . Hypertension Father   . Cancer Father        lung/ healthy  . Heart disease Mother        Deceased  . Cancer Mother        breast  . Hypertension Mother   . Cancer Maternal Grandmother        colon  . Diabetes Brother        type 2  . Heart disease Brother   . Pulmonary embolism Son        vasculiti  . Depression Paternal Grandmother     Social History   Socioeconomic History  . Marital status: Married    Spouse name: Not on file  . Number of  children: 2  . Years of education: Not on file  . Highest education level: Not on file  Occupational History  . Not on file  Social Needs  . Financial resource strain: Not on file  . Food insecurity:    Worry: Not on file    Inability: Not on file  . Transportation needs:    Medical: Not on file    Non-medical: Not on file  Tobacco Use  . Smoking status: Former Smoker    Packs/day: 2.00    Years: 30.00    Pack years: 60.00    Types: Cigarettes    Start date: 12/27/1983  . Smokeless tobacco: Never Used  Substance and Sexual Activity  . Alcohol use: Yes    Alcohol/week: 0.0 standard drinks    Comment: occasionally  . Drug use: No  . 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   Lifestyle  . Physical activity:    Days per week: Not on file    Minutes per session: Not on file  . Stress: Not on file  Relationships  . Social connections:    Talks on phone: Not on file    Gets together: Not on file    Attends religious service: Not on file    Active member of club or organization: Not on file    Attends meetings of clubs or organizations: Not on file    Relationship status: Not on file  . Intimate partner violence:    Fear of current or ex partner: Not on file    Emotionally abused: Not on file    Physically abused: Not on file    Forced sexual activity: Not on file  Other Topics Concern  . Not on file  Social History Narrative  . Not on file    Outpatient Medications Prior to Visit  Medication Sig Dispense Refill  . aspirin 81 MG tablet Take 81 mg by mouth daily.    . enalapril (VASOTEC) 2.5 MG tablet Take 1 tablet (2.5 mg total) by mouth daily. 90 tablet 1  . glipiZIDE (GLUCOTROL) 5 MG tablet Take 1 tablet (5 mg total) by mouth daily before breakfast. 1 tab po in am and 1/2 tab po in pm (Patient taking differently: Take 5 mg by mouth daily before breakfast. 1 tab po in am and 1 tab po in pm) 360 tablet 2  .  levothyroxine (SYNTHROID, LEVOTHROID) 100 MCG tablet TAKE 1 TABLET (100 MCG TOTAL) BY MOUTH DAILY BEFORE BREAKFAST. 90 tablet 1  . metFORMIN (GLUCOPHAGE-XR) 500 MG 24 hr tablet TAKE 2 TABLETS BY MOUTH TWICE A DAY 360 tablet 1  .  ONE TOUCH LANCETS MISC Use as directed three times daily to check blood sugar.  DX E11.8 300 each 3  . ONETOUCH VERIO test strip USE AS DIRECTED ONCE DAILY TO CHECK BLOOD SUGAR. DX E11.8 100 each 2  . rosuvastatin (CRESTOR) 40 MG tablet Take 1 tablet (40 mg total) by mouth daily. 90 tablet 3   No facility-administered medications prior to visit.     No Known Allergies  Review of Systems  Constitutional: Negative for fever and malaise/fatigue.  HENT: Negative for congestion.   Eyes: Negative for blurred vision.  Respiratory: Negative for shortness of breath.   Cardiovascular: Negative for chest pain, palpitations and leg swelling.  Gastrointestinal: Positive for constipation. Negative for abdominal pain, blood in stool and nausea.  Genitourinary: Negative for dysuria and frequency.  Musculoskeletal: Negative for falls.  Skin: Negative for rash.  Neurological: Negative for dizziness, loss of consciousness and headaches.  Endo/Heme/Allergies: Negative for environmental allergies.  Psychiatric/Behavioral: Negative for depression. The patient is not nervous/anxious.        Objective:    Physical Exam  Constitutional: He is oriented to person, place, and time. He appears well-developed and well-nourished. No distress.  HENT:  Head: Normocephalic and atraumatic.  Nose: Nose normal.  Eyes: Right eye exhibits no discharge. Left eye exhibits no discharge.  Neck: Normal range of motion. Neck supple.  Cardiovascular: Normal rate and regular rhythm.  No murmur heard. Pulmonary/Chest: Effort normal and breath sounds normal.  Abdominal: Soft. Bowel sounds are normal. There is no tenderness.  Musculoskeletal: He exhibits no edema.  Neurological: He is alert and  oriented to person, place, and time.  Skin: Skin is warm and dry.  Psychiatric: He has a normal mood and affect.  Nursing note and vitals reviewed.   BP 122/70 (BP Location: Right Arm, Patient Position: Sitting, Cuff Size: Normal)   Pulse 68   Temp 98.1 F (36.7 C) (Oral)   Resp 16   Ht 6' (1.829 m)   Wt 193 lb 12.8 oz (87.9 kg)   SpO2 98%   BMI 26.28 kg/m  Wt Readings from Last 3 Encounters:  09/18/18 193 lb 12.8 oz (87.9 kg)  08/24/18 191 lb 12.8 oz (87 kg)  03/07/18 194 lb (88 kg)     Lab Results  Component Value Date   WBC 6.7 09/18/2018   HGB 13.8 09/18/2018   HCT 41.1 09/18/2018   PLT 200.0 09/18/2018   GLUCOSE 155 (H) 09/18/2018   CHOL 87 09/18/2018   TRIG 86.0 09/18/2018   HDL 45.30 09/18/2018   LDLDIRECT 43.0 04/11/2016   LDLCALC 25 09/18/2018   ALT 18 09/18/2018   AST 23 09/18/2018   NA 139 09/18/2018   K 4.5 09/18/2018   CL 107 09/18/2018   CREATININE 1.26 09/18/2018   BUN 15 09/18/2018   CO2 26 09/18/2018   TSH 0.36 09/18/2018   PSA 0.71 03/07/2018   INR 1.61 (H) 05/17/2016   HGBA1C 7.3 (H) 09/18/2018   MICROALBUR 1.3 09/18/2018    Lab Results  Component Value Date   TSH 0.36 09/18/2018   Lab Results  Component Value Date   WBC 6.7 09/18/2018   HGB 13.8 09/18/2018   HCT 41.1 09/18/2018   MCV 83.4 09/18/2018   PLT 200.0 09/18/2018   Lab Results  Component Value Date   NA 139 09/18/2018   K 4.5 09/18/2018   CO2 26 09/18/2018   GLUCOSE 155 (H) 09/18/2018   BUN 15 09/18/2018   CREATININE 1.26 09/18/2018  BILITOT 0.4 09/18/2018   ALKPHOS 69 09/18/2018   AST 23 09/18/2018   ALT 18 09/18/2018   PROT 7.2 09/18/2018   ALBUMIN 4.4 09/18/2018   CALCIUM 9.7 09/18/2018   ANIONGAP 7 10/01/2016   GFR 59.17 (L) 09/18/2018   Lab Results  Component Value Date   CHOL 87 09/18/2018   Lab Results  Component Value Date   HDL 45.30 09/18/2018   Lab Results  Component Value Date   LDLCALC 25 09/18/2018   Lab Results  Component Value  Date   TRIG 86.0 09/18/2018   Lab Results  Component Value Date   CHOLHDL 2 09/18/2018   Lab Results  Component Value Date   HGBA1C 7.3 (H) 09/18/2018       Assessment & Plan:   Problem List Items Addressed This Visit    Hyperlipidemia - Primary    Encouraged heart healthy diet, increase exercise, avoid trans fats, consider a krill oil cap daily      Relevant Orders   Lipid panel (Completed)   Hypothyroidism    On Levothyroxine, continue to monitor      HTN (hypertension)    Well controlled, no changes to meds. Encouraged heart healthy diet such as the DASH diet and exercise as tolerated.       Relevant Orders   CBC (Completed)   Comprehensive metabolic panel (Completed)   Lipid panel (Completed)   TSH (Completed)   Type 2 diabetes with complication (HCC)    NOBS9G acceptable, minimize simple carbs. Increase exercise as tolerated.       Relevant Orders   Hemoglobin A1c (Completed)   Urine Microalbumin w/creat. ratio (Completed)   Chronic renal disease, stage III ([place])    Check cmp      Constipation   Relevant Orders   Ambulatory referral to Gastroenterology    Other Visit Diagnoses    Need for influenza vaccination       Relevant Orders   Flu vaccine HIGH DOSE PF (Fluzone High dose) (Completed)      I am having Amour C. Campanaro maintain his aspirin, ONE TOUCH LANCETS, glipiZIDE, enalapril, rosuvastatin, levothyroxine, metFORMIN, and ONETOUCH VERIO.  No orders of the defined types were placed in this encounter.    Penni Homans, MD

## 2018-09-23 NOTE — Assessment & Plan Note (Signed)
Encouraged heart healthy diet, increase exercise, avoid trans fats, consider a krill oil cap daily 

## 2018-09-25 DIAGNOSIS — L814 Other melanin hyperpigmentation: Secondary | ICD-10-CM | POA: Diagnosis not present

## 2018-09-25 DIAGNOSIS — Z08 Encounter for follow-up examination after completed treatment for malignant neoplasm: Secondary | ICD-10-CM | POA: Diagnosis not present

## 2018-09-25 DIAGNOSIS — Z85820 Personal history of malignant melanoma of skin: Secondary | ICD-10-CM | POA: Diagnosis not present

## 2018-09-25 DIAGNOSIS — X32XXXS Exposure to sunlight, sequela: Secondary | ICD-10-CM | POA: Diagnosis not present

## 2018-09-27 ENCOUNTER — Telehealth: Payer: Self-pay

## 2018-09-27 NOTE — Telephone Encounter (Signed)
Copied from [place] [id number]. Topic: Referral - Status >> Sep 24, 2018  4:11 PM Vernona Rieger wrote: Reason for CRM: Patient's wife is calling to check the status of the GI referral that was placed. Please contact patient's wife. [place]  Please advise

## 2018-10-02 DIAGNOSIS — D485 Neoplasm of uncertain behavior of skin: Secondary | ICD-10-CM | POA: Diagnosis not present

## 2018-10-04 ENCOUNTER — Encounter: Payer: Self-pay | Admitting: Gastroenterology

## 2018-10-04 ENCOUNTER — Ambulatory Visit (INDEPENDENT_AMBULATORY_CARE_PROVIDER_SITE_OTHER): Payer: Medicare Other | Admitting: Gastroenterology

## 2018-10-04 VITALS — BP 134/82 | HR 64 | Ht 72.0 in | Wt 194.0 lb

## 2018-10-04 DIAGNOSIS — R194 Change in bowel habit: Secondary | ICD-10-CM

## 2018-10-04 DIAGNOSIS — K5730 Diverticulosis of large intestine without perforation or abscess without bleeding: Secondary | ICD-10-CM

## 2018-10-04 DIAGNOSIS — Z86010 Personal history of colonic polyps: Secondary | ICD-10-CM

## 2018-10-04 NOTE — Progress Notes (Signed)
Chief Complaint: Change in bowel habits  Referring Provider:  Mosie Lukes, MD      ASSESSMENT AND PLAN;   #1. Change in bowel habits.  #2.  Colonic diverticulosis.  I have reviewed the colonoscopy report from [place].  Performed 03/19/2013-diverticulosis, internal hemorrhoids.  Due to previous history of colonic polyps, repeat colonoscopy in 5 years.  Plan: - Proceed with colonoscopy.  I have discussed the risks and benefits.  The risks including risk of perforation requiring laparotomy, bleeding after polypectomy requiring blood transfusions and risks of anesthesia/sedation were discussed.  Rare risks of missing colorectal neoplasms were also discussed.  Alternatives were given.  Patient is fully aware and agrees to proceed. All the questions were answered. Colonoscopy will be scheduled in upcoming days.  Patient is to report immediately if there is any significant weight loss or excessive bleeding until then. Consent forms were given for review. - prunes 2/day. - Patient is to call in 2 weeks.  If still with problems, add Colace 1 tablet p.o. once a day.  If still with problems would add Benefiber.  If still with problems, add MiraLAX. - Discussed with the patient in detail.  I reassured the patient as well.   HPI:    Jack Wade is a 77 y.o. male  Constipation 1-2/week x 5-6 weeks Colon at [facility], [place] 03/19/2013-  No polyps, div (rpt 5 yrs) -report scanned. Change in bowel habits Has been regular Has started taking prunes 3 to 4/day when he gets constipated.  It would do him good, then he would stop taking prunes. He has taken his wife's laxatives with good results. Currently not taking any laxatives. Concerned about change in bowel habits. No melena or hematochezia No weight loss or abdominal pain. No change in appetite He does walk-no change in exercise pattern. No history of travel. No change in any medications.   Past Medical History:    Diagnosis Date  . Aortic calcification ([place]) 02/09/2015  . Appendicitis   . Back pain 02/09/2015  . CAD (coronary artery disease)    2 stents  . Chicken pox   . Diabetes mellitus type 2 in obese ([place]) 04/11/2016  . Diabetes type 2, controlled ([place])   . Dysphagia, unspecified(787.20) 03/20/2014  . Hereditary and idiopathic peripheral neuropathy 02/09/2015  . Hyperlipidemia   . Hypertension   . Hypothyroidism   . Light headedness 07/04/2017  . Measles   . Medicare annual wellness visit, subsequent 06/14/2015  . Melanoma ([place])    Scalp. 2012  . Mumps as a child  . Thyroid disease    Beoming Hyperthyroidism    Past Surgical History:  Procedure Laterality Date  . APPENDECTOMY    . BACK SURGERY     x2 lumbar  . BACK SURGERY     late 1990s had 2 surgeries, first surgery lifted a heavy engine ruptured disds, at L4 and L5, cleaned discs no hardware very helpful. 2 years later while recovering from angiogram with weights applied to left leg caused a recurrence and required surgery again in same area with disc repair  . CARDIAC CATHETERIZATION    . CARDIAC CATHETERIZATION N/A 05/11/2016   Procedure: Left Heart Cath and Coronary Angiography;  Surgeon: Belva Crome, MD;  Location: [place] [facility];  Service: Cardiovascular;  Laterality: N/A;  . CARDIAC CATHETERIZATION N/A 05/11/2016   Procedure: Intravascular Pressure Wire/FFR Study;  Surgeon: Belva Crome, MD;  Location: [place] [facility];  Service: Cardiovascular;  Laterality: N/A;  . CORONARY ANGIOPLASTY WITH STENT PLACEMENT     2 stents  . CORONARY ARTERY BYPASS GRAFT N/A 05/17/2016   Procedure: CORONARY ARTERY BYPASS GRAFTING (CABG) x 6 using left mammory artery and right greater saphenous vein harvested endoscopically. [place] to LAD, SVG to Diagonal, SVG Sequential to OM2 and ramus intermediate, SVG Sequential to RCA and PDA;  Surgeon: [name], MD;  Location: [place];  Service: Open Heart Surgery;  Laterality: N/A;  . KNEE  ARTHROSCOPY     right knee  . LUMBAR LAMINECTOMY/DECOMPRESSION MICRODISCECTOMY Bilateral 04/10/2015   Procedure: LUMBAR LAMINECTOMY/DECOMPRESSION MICRODISCECTOMY 2 LEVELS;  Surgeon: Ashok Pall, MD;  Location: [place] [facility];  Service: Neurosurgery;  Laterality: Bilateral;  Bilateral L45 L5S1 Laminectomy and Foraminotomy  . ROTATOR CUFF REPAIR Right   . SKIN CANCER EXCISION     melanoma on scalp  . TEE WITHOUT CARDIOVERSION N/A 05/17/2016   Procedure: TRANSESOPHAGEAL ECHOCARDIOGRAM (TEE);  Surgeon: [name], MD;  Location: [place];  Service: Open Heart Surgery;  Laterality: N/A;  . TONSILLECTOMY AND ADENOIDECTOMY    . WISDOM TOOTH EXTRACTION      Family History  Problem Relation Age of Onset  . Heart disease Father   . Emphysema Father   . Hypertension Father   . Cancer Father        lung/ healthy  . Heart disease Mother        Deceased  . Cancer Mother        breast  . Hypertension Mother   . Cancer Maternal Grandmother        colon  . Diabetes Brother        type 2  . Heart disease Brother   . Pulmonary embolism Son        vasculiti  . Depression Paternal Grandmother     Social History   Tobacco Use  . Smoking status: Former Smoker    Packs/day: 2.00    Years: 30.00    Pack years: 60.00    Types: Cigarettes    Start date: 12/27/1983  . Smokeless tobacco: Never Used  Substance Use Topics  . Alcohol use: Yes    Alcohol/week: 0.0 standard drinks    Comment: occasionally  . Drug use: No    Current Outpatient Medications  Medication Sig Dispense Refill  . aspirin 81 MG tablet Take 81 mg by mouth daily.    . enalapril (VASOTEC) 2.5 MG tablet Take 1 tablet (2.5 mg total) by mouth daily. 90 tablet 1  . glipiZIDE (GLUCOTROL) 5 MG tablet Take 1 tablet (5 mg total) by mouth daily before breakfast. 1 tab po in am and 1/2 tab po in pm (Patient taking differently: Take 5 mg by mouth daily before breakfast. 1 tab po in am and 1 tab po in pm) 360 tablet 2  . levothyroxine  (SYNTHROID, LEVOTHROID) 100 MCG tablet TAKE 1 TABLET (100 MCG TOTAL) BY MOUTH DAILY BEFORE BREAKFAST. 90 tablet 1  . metFORMIN (GLUCOPHAGE-XR) 500 MG 24 hr tablet TAKE 2 TABLETS BY MOUTH TWICE A DAY 360 tablet 1  . ONE TOUCH LANCETS MISC Use as directed three times daily to check blood sugar.  DX E11.8 300 each 3  . ONETOUCH VERIO test strip USE AS DIRECTED ONCE DAILY TO CHECK BLOOD SUGAR. DX E11.8 100 each 2  . rosuvastatin (CRESTOR) 40 MG tablet Take 1 tablet (40 mg total) by mouth daily. 90 tablet 3   No current facility-administered medications for this visit.  No Known Allergies  Review of Systems:  Constitutional: Denies fever, chills, diaphoresis, appetite change and fatigue.  HEENT: Denies photophobia, eye pain, redness, hearing loss, ear pain, congestion, sore throat, rhinorrhea, sneezing, mouth sores, neck pain, neck stiffness and tinnitus.   Respiratory: Denies SOB, DOE, cough, chest tightness,  and wheezing.   Cardiovascular: Denies chest pain, palpitations and leg swelling.  Genitourinary: Denies dysuria, urgency, frequency, hematuria, flank pain and difficulty urinating.  Musculoskeletal: Denies myalgias, back pain, joint swelling, arthralgias and gait problem.  Skin: No rash.  Neurological: Denies dizziness, seizures, syncope, weakness, light-headedness, numbness and headaches.  Hematological: Denies adenopathy. Easy bruising, personal or family bleeding history  Psychiatric/Behavioral: No anxiety or depression     Physical Exam:    BP 134/82   Pulse 64   Ht 6' (1.829 m)   Wt 194 lb (88 kg)   BMI 26.31 kg/m  Filed Weights   10/04/18 1538  Weight: 194 lb (88 kg)   Constitutional:  Well-developed, in no acute distress. Psychiatric: Normal mood and affect. Behavior is normal. HEENT: Pupils normal.  Conjunctivae are normal. No scleral icterus. Neck supple.  Cardiovascular: Normal rate, regular rhythm. No edema Pulmonary/chest: Effort normal and breath sounds  normal. No wheezing, rales or rhonchi. Abdominal: Soft, nondistended. Nontender. Bowel sounds active throughout. There are no masses palpable. No hepatomegaly. Rectal:  defered Neurological: Alert and oriented to person place and time. Skin: Skin is warm and dry. No rashes noted.  Data Reviewed: I have personally reviewed following labs and imaging studies  CBC: CBC Latest Ref Rng & Units 09/18/2018 03/07/2018 12/07/2017  WBC 4.0 - 10.5 K/uL 6.7 7.0 5.7  Hemoglobin 13.0 - 17.0 g/dL 13.8 14.1 14.2  Hematocrit 39.0 - 52.0 % 41.1 42.1 43.1  Platelets 150.0 - 400.0 K/uL 200.0 213.0 197.0    CMP: CMP Latest Ref Rng & Units 09/18/2018 03/07/2018 12/07/2017  Glucose 70 - 99 mg/dL 155(H) 216(H) 177(H)  BUN 6 - 23 mg/dL 15 20 17   Creatinine 0.40 - 1.50 mg/dL 1.26 1.40 1.48  Sodium 135 - 145 mEq/L 139 137 140  Potassium 3.5 - 5.1 mEq/L 4.5 4.8 5.1  Chloride 96 - 112 mEq/L 107 103 106  CO2 19 - 32 mEq/L 26 27 29   Calcium 8.4 - 10.5 mg/dL 9.7 10.2 9.8  Total Protein 6.0 - 8.3 g/dL 7.2 6.8 7.1  Total Bilirubin 0.2 - 1.2 mg/dL 0.4 0.5 0.5  Alkaline Phos 39 - 117 U/L 69 61 58  AST 0 - 37 U/L 23 19 20   ALT 0 - 53 U/L 18 16 13      Carmell Austria, MD 10/04/2018, 3:53 PM  Cc: Mosie Lukes, MD

## 2018-10-04 NOTE — Patient Instructions (Signed)
If you are age 76 or older, your body mass index should be between 23-30. Your Body mass index is 26.31 kg/m. If this is out of the aforementioned range listed, please consider follow up with your Primary Care Provider.  If you are age 57 or younger, your body mass index should be between 19-25. Your Body mass index is 26.31 kg/m. If this is out of the aformentioned range listed, please consider follow up with your Primary Care Provider.   You have been scheduled for a colonoscopy. Please follow written instructions given to you at your visit today.  Please pick up your prep supplies at the pharmacy within the next 1-3 days. If you use inhalers (even only as needed), please bring them with you on the day of your procedure. Your physician has requested that you go to www.startemmi.com and enter the access code given to you at your visit today. This web site gives a general overview about your procedure. However, you should still follow specific instructions given to you by our office regarding your preparation for the procedure.  Thank you,  Dr. Jackquline Denmark

## 2018-10-05 DIAGNOSIS — R238 Other skin changes: Secondary | ICD-10-CM | POA: Diagnosis not present

## 2018-10-05 DIAGNOSIS — D034 Melanoma in situ of scalp and neck: Secondary | ICD-10-CM | POA: Diagnosis not present

## 2018-10-05 DIAGNOSIS — L814 Other melanin hyperpigmentation: Secondary | ICD-10-CM | POA: Diagnosis not present

## 2018-10-17 ENCOUNTER — Ambulatory Visit (AMBULATORY_SURGERY_CENTER): Payer: Medicare Other | Admitting: Gastroenterology

## 2018-10-17 ENCOUNTER — Encounter: Payer: Self-pay | Admitting: Gastroenterology

## 2018-10-17 VITALS — BP 112/68 | HR 55 | Temp 96.8°F | Resp 20 | Ht 72.0 in | Wt 194.0 lb

## 2018-10-17 DIAGNOSIS — D124 Benign neoplasm of descending colon: Secondary | ICD-10-CM | POA: Diagnosis not present

## 2018-10-17 DIAGNOSIS — Z86010 Personal history of colonic polyps: Secondary | ICD-10-CM | POA: Diagnosis not present

## 2018-10-17 DIAGNOSIS — R194 Change in bowel habit: Secondary | ICD-10-CM | POA: Diagnosis not present

## 2018-10-17 DIAGNOSIS — D122 Benign neoplasm of ascending colon: Secondary | ICD-10-CM | POA: Diagnosis not present

## 2018-10-17 DIAGNOSIS — K635 Polyp of colon: Secondary | ICD-10-CM

## 2018-10-17 MED ORDER — SODIUM CHLORIDE 0.9 % IV SOLN: 500.0000 mL | Freq: Once | INTRAVENOUS | Status: DC

## 2018-10-17 NOTE — Progress Notes (Signed)
Called to room to assist during endoscopic procedure.  Patient ID and intended procedure confirmed with present staff. Received instructions for my participation in the procedure from the performing physician.  

## 2018-10-17 NOTE — Progress Notes (Signed)
Pt's states no medical or surgical changes since previsit or office visit. 

## 2018-10-17 NOTE — Progress Notes (Signed)
PT taken to PACU. Monitors in place. VSS. Report given to RN. 

## 2018-10-17 NOTE — Op Note (Signed)
[facility] Patient Name: Jack Wade Procedure Date: 10/17/2018 10:46 AM MRN: [id number] Endoscopist: Jackquline Denmark , MD Age: 77 Referring MD:  Date of Birth: July 17, 1941 Gender: Male Account #: [id number] Procedure:                Colonoscopy Indications:              High risk colon cancer surveillance: Personal                            history of colonic polyps Medicines:                Monitored Anesthesia Care Procedure:                Pre-Anesthesia Assessment:                           - Prior to the procedure, a History and Physical                            was performed, and patient medications and                            allergies were reviewed. The patient's tolerance of                            previous anesthesia was also reviewed. The risks                            and benefits of the procedure and the sedation                            options and risks were discussed with the patient.                            All questions were answered, and informed consent                            was obtained. Prior Anticoagulants: The patient has                            taken aspirin, last dose was 1 day prior to                            procedure. ASA Grade Assessment: II - A patient                            with mild systemic disease. After reviewing the                            risks and benefits, the patient was deemed in                            satisfactory condition to undergo the procedure.  After obtaining informed consent, the colonoscope                            was passed under direct vision. Throughout the                            procedure, the patient's blood pressure, pulse, and                            oxygen saturations were monitored continuously. The                            Model CF-HQ190L [id number]) scope was introduced                            through the anus and advanced to the  2 cm into the                            ileum. The colonoscopy was performed without                            difficulty. The patient tolerated the procedure                            well. The quality of the bowel preparation was good. Scope In: 10:53:10 AM Scope Out: 11:12:46 AM Scope Withdrawal Time: 0 hours 16 minutes 25 seconds  Total Procedure Duration: 0 hours 19 minutes 36 seconds  Findings:                 A 8 mm polyp was found in the mid ascending colon.                            The polyp was sessile. The polyp was removed with a                            cold snare. Resection and retrieval were complete.                            Estimated blood loss: none.                           Two sessile polyps were found in the descending                            colon. The polyps were 6 to 8 mm in size. These                            polyps were removed with a cold snare. Resection                            and retrieval were complete. Estimated blood loss  was minimal.                           A few small-mouthed diverticula were found in the                            sigmoid colon and ascending colon.                           Non-bleeding internal hemorrhoids were found during                            retroflexion. The hemorrhoids were small.                           The exam was otherwise without abnormality on                            direct and retroflexion views. Complications:            No immediate complications. Estimated Blood Loss:     Estimated blood loss: none. Impression:               -Colonic polyps status post polypectomy                           -Mild pancolonic diverticulosis                           -Otherwise normal colonoscopy. Recommendation:           - Patient has a contact number available for                            emergencies. The signs and symptoms of potential                            delayed  complications were discussed with the                            patient. Return to normal activities tomorrow.                            Written discharge instructions were provided to the                            patient.                           - Resume previous diet.                           - Continue present medications.                           - Await pathology results.                           - Repeat  colonoscopy for surveillance based on                            pathology results.                           - Return to GI clinic PRN. Jackquline Denmark, MD 10/17/2018 11:17:32 AM This report has been signed electronically.

## 2018-10-18 ENCOUNTER — Telehealth: Payer: Self-pay

## 2018-10-18 NOTE — Telephone Encounter (Signed)
Tried number given, not a working number, no message left.

## 2018-10-18 NOTE — Telephone Encounter (Signed)
Called #[phone] and left a messaged we tried to reach pt for a follow up call. maw

## 2018-10-18 NOTE — Telephone Encounter (Signed)
Calling back to advice the office he got our messages. He is doing very good after his procedure.

## 2018-10-20 ENCOUNTER — Other Ambulatory Visit: Payer: Self-pay | Admitting: Cardiology

## 2018-10-20 DIAGNOSIS — E785 Hyperlipidemia, unspecified: Secondary | ICD-10-CM

## 2018-10-22 ENCOUNTER — Encounter: Payer: Self-pay | Admitting: Gastroenterology

## 2018-10-22 NOTE — Telephone Encounter (Signed)
New script sent to the pharmacy and Lab orders mailed to the pt

## 2018-10-22 NOTE — Telephone Encounter (Signed)
Pt requesting change from Rosuvastatin to Atorvastatin due to cost.  Please advise.

## 2018-10-22 NOTE — Telephone Encounter (Signed)
DC crestor; lipitor 80 mg daily; lipids and liver 4 weeks Kirk Ruths

## 2018-11-05 DIAGNOSIS — D034 Melanoma in situ of scalp and neck: Secondary | ICD-10-CM | POA: Diagnosis not present

## 2018-11-05 DIAGNOSIS — L989 Disorder of the skin and subcutaneous tissue, unspecified: Secondary | ICD-10-CM | POA: Diagnosis not present

## 2018-11-09 DIAGNOSIS — Z87891 Personal history of nicotine dependence: Secondary | ICD-10-CM | POA: Diagnosis not present

## 2018-11-09 DIAGNOSIS — Z79899 Other long term (current) drug therapy: Secondary | ICD-10-CM | POA: Diagnosis not present

## 2018-11-09 DIAGNOSIS — Z7984 Long term (current) use of oral hypoglycemic drugs: Secondary | ICD-10-CM | POA: Diagnosis not present

## 2018-11-09 DIAGNOSIS — E785 Hyperlipidemia, unspecified: Secondary | ICD-10-CM | POA: Diagnosis not present

## 2018-11-09 DIAGNOSIS — S0100XA Unspecified open wound of scalp, initial encounter: Secondary | ICD-10-CM | POA: Diagnosis not present

## 2018-11-09 DIAGNOSIS — Z7982 Long term (current) use of aspirin: Secondary | ICD-10-CM | POA: Diagnosis not present

## 2018-11-09 DIAGNOSIS — G4733 Obstructive sleep apnea (adult) (pediatric): Secondary | ICD-10-CM | POA: Diagnosis not present

## 2018-11-09 DIAGNOSIS — Z481 Encounter for planned postprocedural wound closure: Secondary | ICD-10-CM | POA: Diagnosis not present

## 2018-11-09 DIAGNOSIS — Z85820 Personal history of malignant melanoma of skin: Secondary | ICD-10-CM | POA: Diagnosis not present

## 2018-11-09 DIAGNOSIS — E1122 Type 2 diabetes mellitus with diabetic chronic kidney disease: Secondary | ICD-10-CM | POA: Diagnosis not present

## 2018-11-09 DIAGNOSIS — N183 Chronic kidney disease, stage 3 (moderate): Secondary | ICD-10-CM | POA: Diagnosis not present

## 2018-11-09 DIAGNOSIS — C434 Malignant melanoma of scalp and neck: Secondary | ICD-10-CM | POA: Diagnosis not present

## 2018-11-09 DIAGNOSIS — E039 Hypothyroidism, unspecified: Secondary | ICD-10-CM | POA: Diagnosis not present

## 2018-11-09 DIAGNOSIS — Z951 Presence of aortocoronary bypass graft: Secondary | ICD-10-CM | POA: Diagnosis not present

## 2018-11-09 DIAGNOSIS — I129 Hypertensive chronic kidney disease with stage 1 through stage 4 chronic kidney disease, or unspecified chronic kidney disease: Secondary | ICD-10-CM | POA: Diagnosis not present

## 2018-11-09 DIAGNOSIS — Z428 Encounter for other plastic and reconstructive surgery following medical procedure or healed injury: Secondary | ICD-10-CM | POA: Diagnosis not present

## 2018-11-09 DIAGNOSIS — Z955 Presence of coronary angioplasty implant and graft: Secondary | ICD-10-CM | POA: Diagnosis not present

## 2018-11-09 DIAGNOSIS — I2510 Atherosclerotic heart disease of native coronary artery without angina pectoris: Secondary | ICD-10-CM | POA: Diagnosis not present

## 2018-11-19 ENCOUNTER — Other Ambulatory Visit: Payer: Self-pay

## 2018-11-19 DIAGNOSIS — E119 Type 2 diabetes mellitus without complications: Secondary | ICD-10-CM | POA: Diagnosis not present

## 2018-11-19 DIAGNOSIS — Z483 Aftercare following surgery for neoplasm: Secondary | ICD-10-CM | POA: Diagnosis not present

## 2018-11-19 DIAGNOSIS — C434 Malignant melanoma of scalp and neck: Secondary | ICD-10-CM | POA: Diagnosis not present

## 2018-11-19 DIAGNOSIS — Z481 Encounter for planned postprocedural wound closure: Secondary | ICD-10-CM | POA: Diagnosis not present

## 2018-11-19 DIAGNOSIS — Z85820 Personal history of malignant melanoma of skin: Secondary | ICD-10-CM | POA: Diagnosis not present

## 2018-11-19 DIAGNOSIS — Z428 Encounter for other plastic and reconstructive surgery following medical procedure or healed injury: Secondary | ICD-10-CM | POA: Diagnosis not present

## 2018-11-19 MED ORDER — LEVOTHYROXINE SODIUM 100 MCG PO TABS: ORAL_TABLET | ORAL | 1 refills | Status: DC

## 2018-12-28 ENCOUNTER — Other Ambulatory Visit: Payer: Self-pay | Admitting: Family Medicine

## 2019-02-10 ENCOUNTER — Other Ambulatory Visit: Payer: Self-pay | Admitting: Family Medicine

## 2019-03-06 NOTE — Progress Notes (Signed)
HPI: FU coronary artery disease. Lower extremity Dopplers February 2016 showed an occluded right anterior tibial artery. Abdominal ultrasound February 2016 showed no aneurysm. Patient had cardiac catheterization May 2017 secondary to continued exertional chest pain. He was found to have severe three-vessel coronary artery disease and normal LV function. Echocardiogram showed normal LV systolic function, grade 1 diastolic dysfunction and mildly dilated aortic root. Note carotid Dopplers May 2017 Prior to surgery showed 1-39% bilateral stenosis. Patient had coronary artery bypass and graft With LIMA to the LAD, saphenous vein graft to second diagonal, sequential saphenous vein graft to the intermediate and distal circumflex, and sequential saphenous vein graft to the right coronary artery and PDA. Since he was last seen, patient denies dyspnea, chest pain, palpitations or syncope.  Mild dizziness with standing.  Current Outpatient Medications  Medication Sig Dispense Refill  . aspirin 81 MG tablet Take 81 mg by mouth daily.    Marland Kitchen atorvastatin (LIPITOR) 80 MG tablet Take 1 tablet (80 mg total) by mouth daily at 6 PM. 90 tablet 3  . enalapril (VASOTEC) 2.5 MG tablet TAKE 1 TABLET BY MOUTH EVERY DAY 90 tablet 1  . glipiZIDE (GLUCOTROL) 5 MG tablet Take 1 tablet (5 mg total) by mouth daily before breakfast. 1 tab po in am and 1/2 tab po in pm (Patient taking differently: Take 5 mg by mouth daily before breakfast. 1 tab po in am and 1 tab po in pm) 360 tablet 2  . levothyroxine (SYNTHROID, LEVOTHROID) 100 MCG tablet TAKE 1 TABLET (100 MCG TOTAL) BY MOUTH DAILY BEFORE BREAKFAST. 90 tablet 1  . metFORMIN (GLUCOPHAGE-XR) 500 MG 24 hr tablet TAKE 2 TABLETS BY MOUTH TWICE A DAY 360 tablet 1  . ONE TOUCH LANCETS MISC Use as directed three times daily to check blood sugar.  DX E11.8 300 each 3  . ONETOUCH VERIO test strip USE AS DIRECTED ONCE DAILY TO CHECK BLOOD SUGAR. DX E11.8 100 each 2   Current  Facility-Administered Medications  Medication Dose Route Frequency Provider Last Rate Last Dose  . 0.9 %  sodium chloride infusion  500 mL Intravenous Once Jackquline Denmark, MD         Past Medical History:  Diagnosis Date  . Aortic calcification ([place]) 02/09/2015  . Appendicitis   . Back pain 02/09/2015  . CAD (coronary artery disease)    2 stents  . Chicken pox   . Diabetes mellitus type 2 in obese ([place]) 04/11/2016  . Diabetes type 2, controlled ([place])   . Dysphagia, unspecified(787.20) 03/20/2014  . Hereditary and idiopathic peripheral neuropathy 02/09/2015  . Hyperlipidemia   . Hypertension   . Hypothyroidism   . Light headedness 07/04/2017  . Measles   . Medicare annual wellness visit, subsequent 06/14/2015  . Melanoma ([place])    Scalp. 2012  . Mumps as a child  . Thyroid disease    [place] Hyperthyroidism    Past Surgical History:  Procedure Laterality Date  . APPENDECTOMY    . BACK SURGERY     x2 lumbar  . BACK SURGERY     late 1990s had 2 surgeries, first surgery lifted a heavy engine ruptured disds, at L4 and L5, cleaned discs no hardware very helpful. 2 years later while recovering from angiogram with weights applied to left leg caused a recurrence and required surgery again in same area with disc repair  . CARDIAC CATHETERIZATION    . CARDIAC CATHETERIZATION N/A 05/11/2016   Procedure: Left Heart Cath  and Coronary Angiography;  Surgeon: Belva Crome, MD;  Location: [facility];  Service: Cardiovascular;  Laterality: N/A;  . CARDIAC CATHETERIZATION N/A 05/11/2016   Procedure: Intravascular Pressure Wire/FFR Study;  Surgeon: Belva Crome, MD;  Location: [facility];  Service: Cardiovascular;  Laterality: N/A;  . CORONARY ANGIOPLASTY WITH STENT PLACEMENT     2 stents  . CORONARY ARTERY BYPASS GRAFT N/A 05/17/2016   Procedure: CORONARY ARTERY BYPASS GRAFTING (CABG) x 6 using left mammory artery and right greater saphenous vein harvested endoscopically. [place] to LAD,  SVG to Diagonal, SVG Sequential to OM2 and ramus intermediate, SVG Sequential to RCA and PDA;  Surgeon: [name], MD;  Location: [facility];  Service: Open Heart Surgery;  Laterality: N/A;  . KNEE ARTHROSCOPY     right knee  . LUMBAR LAMINECTOMY/DECOMPRESSION MICRODISCECTOMY Bilateral 04/10/2015   Procedure: LUMBAR LAMINECTOMY/DECOMPRESSION MICRODISCECTOMY 2 LEVELS;  Surgeon: Ashok Pall, MD;  Location: [facility];  Service: Neurosurgery;  Laterality: Bilateral;  Bilateral L45 L5S1 Laminectomy and Foraminotomy  . ROTATOR CUFF REPAIR Right   . SKIN CANCER EXCISION     melanoma on scalp  . TEE WITHOUT CARDIOVERSION N/A 05/17/2016   Procedure: TRANSESOPHAGEAL ECHOCARDIOGRAM (TEE);  Surgeon: [name], MD;  Location: [facility];  Service: Open Heart Surgery;  Laterality: N/A;  . TONSILLECTOMY AND ADENOIDECTOMY    . WISDOM TOOTH EXTRACTION      Social History   Socioeconomic History  . Marital status: Married    Spouse name: Not on file  . Number of children: 2  . Years of education: Not on file  . Highest education level: Not on file  Occupational History  . Not on file  Social Needs  . Financial resource strain: Not on file  . Food insecurity:    Worry: Not on file    Inability: Not on file  . Transportation needs:    Medical: Not on file    Non-medical: Not on file  Tobacco Use  . Smoking status: Former Smoker    Packs/day: 2.00    Years: 30.00    Pack years: 60.00    Types: Cigarettes    Start date: 12/27/1983  . Smokeless tobacco: Never Used  Substance and Sexual Activity  . Alcohol use: Yes    Alcohol/week: 0.0 standard drinks    Comment: occasionally  . Drug use: No  . 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   Lifestyle  . Physical activity:    Days per week: Not on file    Minutes per session: Not on file  . Stress: Not on file  Relationships  . Social connections:    Talks on phone: Not on file     Gets together: Not on file    Attends religious service: Not on file    Active member of club or organization: Not on file    Attends meetings of clubs or organizations: Not on file    Relationship status: Not on file  . Intimate partner violence:    Fear of current or ex partner: Not on file    Emotionally abused: Not on file    Physically abused: Not on file    Forced sexual activity: Not on file  Other Topics Concern  . Not on file  Social History Narrative  . Not on file    Family History  Problem Relation Age of Onset  . Heart disease  Father   . Emphysema Father   . Hypertension Father   . Cancer Father        lung/ healthy  . Heart disease Mother        Deceased  . Cancer Mother        breast  . Hypertension Mother   . Cancer Maternal Grandmother        colon  . Diabetes Brother        type 2  . Heart disease Brother   . Pulmonary embolism Son        vasculiti  . Depression Paternal Grandmother     ROS: no fevers or chills, productive cough, hemoptysis, dysphasia, odynophagia, melena, hematochezia, dysuria, hematuria, rash, seizure activity, orthopnea, PND, pedal edema, claudication. Remaining systems are negative.  Physical Exam: Well-developed well-nourished in no acute distress.  Skin is warm and dry.  HEENT is normal.  Neck is supple.  Chest is clear to auscultation with normal expansion.  Cardiovascular exam is regular rate and rhythm.  Abdominal exam nontender or distended. No masses palpated. Extremities show no edema. neuro grossly intact  ECG-sinus rhythm at a rate of 63, first-degree AV block, left anterior fascicular block, right bundle branch block.  Personally reviewed  A/P  1 coronary artery disease-patient continues to do well status post coronary artery bypass and graft.  He denies chest pain.  Continue medical therapy with aspirin and statin.  2 hyperlipidemia-continue statin.  Last LDL September 24 25. Liver function is normal.  3  peripheral vascular disease-patient denies claudication.  Continue medical therapy with aspirin and statin.  4 history of orthostatic hypotension-we discussed continuing increase sodium and fluid intake.  Symptoms are reasonably well controlled.  Kirk Ruths, MD

## 2019-03-13 ENCOUNTER — Ambulatory Visit: Payer: Medicare Other | Admitting: Cardiology

## 2019-03-13 ENCOUNTER — Encounter: Payer: Self-pay | Admitting: Cardiology

## 2019-03-13 ENCOUNTER — Other Ambulatory Visit: Payer: Self-pay

## 2019-03-13 ENCOUNTER — Ambulatory Visit (INDEPENDENT_AMBULATORY_CARE_PROVIDER_SITE_OTHER): Payer: Medicare Other | Admitting: Cardiology

## 2019-03-13 VITALS — BP 114/60 | HR 63 | Ht 72.0 in | Wt 199.0 lb

## 2019-03-13 DIAGNOSIS — I739 Peripheral vascular disease, unspecified: Secondary | ICD-10-CM

## 2019-03-13 DIAGNOSIS — E7800 Pure hypercholesterolemia, unspecified: Secondary | ICD-10-CM | POA: Diagnosis not present

## 2019-03-13 DIAGNOSIS — I2510 Atherosclerotic heart disease of native coronary artery without angina pectoris: Secondary | ICD-10-CM | POA: Diagnosis not present

## 2019-03-13 NOTE — Patient Instructions (Signed)
Your physician wants you to follow-up in: ONE YEAR WITH DR CRENSHAW You will receive a reminder letter in the mail two months in advance. If you don't receive a letter, please call our office to schedule the follow-up appointment.  

## 2019-03-16 ENCOUNTER — Encounter: Payer: Self-pay | Admitting: Family Medicine

## 2019-03-19 ENCOUNTER — Ambulatory Visit: Payer: Medicare Other | Admitting: Family Medicine

## 2019-04-15 ENCOUNTER — Ambulatory Visit: Payer: Medicare Other | Admitting: Family Medicine

## 2019-07-02 ENCOUNTER — Telehealth: Payer: Self-pay | Admitting: Family Medicine

## 2019-07-02 NOTE — Telephone Encounter (Signed)
Spouse requesting a refill of all medication except metFORMIN (GLUCOPHAGE-XR) 500 MG 24 hr tablet , spouse was unable to specify medication names  [id number] - [place], [place] - [address] AT [place] [phone] (Phone) [phone] (Fax

## 2019-07-04 ENCOUNTER — Other Ambulatory Visit: Payer: Self-pay | Admitting: Cardiology

## 2019-07-04 DIAGNOSIS — E785 Hyperlipidemia, unspecified: Secondary | ICD-10-CM

## 2019-07-04 NOTE — Telephone Encounter (Signed)
°*  STAT* If patient is at the pharmacy, call can be transferred to refill team.    1. Which medications need to be refilled? (please list name of each medication and dose if known) atorvastatin (LIPITOR) 80 MG tablet   2. Which pharmacy/location (including street and city if local pharmacy) is medication to be sent to?  [id number] - [place], [place] - [address] AT [place] [phone] (Phone) [phone] (Fax)    3. Do they need a 30 day or 90 day supply? 90 day

## 2019-07-05 MED ORDER — ATORVASTATIN CALCIUM 80 MG PO TABS: 80.0000 mg | ORAL_TABLET | Freq: Every day | ORAL | 2 refills | Status: DC

## 2019-07-05 NOTE — Telephone Encounter (Signed)
Rx(s) sent to pharmacy electronically.  

## 2019-07-09 MED ORDER — METFORMIN HCL ER 500 MG PO TB24: 1000.0000 mg | ORAL_TABLET | Freq: Two times a day (BID) | ORAL | 1 refills | Status: DC

## 2019-07-09 NOTE — Telephone Encounter (Signed)
meds sent

## 2019-07-11 ENCOUNTER — Other Ambulatory Visit: Payer: Self-pay | Admitting: Family Medicine

## 2019-08-07 ENCOUNTER — Other Ambulatory Visit: Payer: Self-pay | Admitting: Family Medicine

## 2019-08-10 DIAGNOSIS — Z23 Encounter for immunization: Secondary | ICD-10-CM | POA: Diagnosis not present

## 2019-08-26 ENCOUNTER — Ambulatory Visit: Payer: Medicare Other | Admitting: *Deleted

## 2019-08-27 ENCOUNTER — Ambulatory Visit (INDEPENDENT_AMBULATORY_CARE_PROVIDER_SITE_OTHER): Payer: Medicare Other | Admitting: Family Medicine

## 2019-08-27 ENCOUNTER — Encounter: Payer: Self-pay | Admitting: Family Medicine

## 2019-08-27 ENCOUNTER — Other Ambulatory Visit: Payer: Self-pay

## 2019-08-27 VITALS — BP 111/67 | Wt 187.0 lb

## 2019-08-27 DIAGNOSIS — N183 Chronic kidney disease, stage 3 unspecified: Secondary | ICD-10-CM

## 2019-08-27 DIAGNOSIS — I10 Essential (primary) hypertension: Secondary | ICD-10-CM

## 2019-08-27 DIAGNOSIS — R809 Proteinuria, unspecified: Secondary | ICD-10-CM

## 2019-08-27 DIAGNOSIS — E038 Other specified hypothyroidism: Secondary | ICD-10-CM | POA: Diagnosis not present

## 2019-08-27 DIAGNOSIS — E118 Type 2 diabetes mellitus with unspecified complications: Secondary | ICD-10-CM

## 2019-08-27 DIAGNOSIS — I2510 Atherosclerotic heart disease of native coronary artery without angina pectoris: Secondary | ICD-10-CM | POA: Diagnosis not present

## 2019-08-27 DIAGNOSIS — R351 Nocturia: Secondary | ICD-10-CM | POA: Diagnosis not present

## 2019-08-27 DIAGNOSIS — E785 Hyperlipidemia, unspecified: Secondary | ICD-10-CM | POA: Diagnosis not present

## 2019-08-28 NOTE — Assessment & Plan Note (Signed)
Tolerating Enalapirl

## 2019-08-28 NOTE — Assessment & Plan Note (Signed)
Tolerating statin, encouraged heart healthy diet, avoid trans fats, minimize simple carbs and saturated fats. Increase exercise as tolerated 

## 2019-08-28 NOTE — Assessment & Plan Note (Signed)
Check vitals weekly and report any concerns, no changes to meds. Encouraged heart healthy diet such as the DASH diet and exercise as tolerated.

## 2019-08-28 NOTE — Assessment & Plan Note (Signed)
On Levothyroxine, continue to monitor 

## 2019-08-28 NOTE — Assessment & Plan Note (Signed)
His sugars had been running low so he dropped his Glipizide to 1 tab bid and his Metformin to 1 tab po bid and he reports less low sugars and good control. He did this about 9 months ago. Will recheck hgba1c to assess status

## 2019-08-28 NOTE — Assessment & Plan Note (Signed)
Hydrate and monitor 

## 2019-08-28 NOTE — Progress Notes (Signed)
Virtual Visit via phone Note  I connected with Jack Wade on 08/27/19 at  3:20 PM EDT by a phone enabled telemedicine application and verified that I am speaking with the correct person using two identifiers.  Location: Patient: home Provider: office   I discussed the limitations of evaluation and management by telemedicine and the availability of in person appointments. The patient expressed understanding and agreed to proceed. Magdalene Molly, CMA was able to get the patient set up on phone visit after being unable to set up a video visit.    Subjective:    Patient ID: Jack Wade, male    DOB: 1941-07-04, 78 y.o.   MRN: [id number]  No chief complaint on file.   HPI Patient is in today for follow up on chronic medical concerns including diabetes, hypothyroidism, hyperlipidemia and more. He reports he feels well. No recent febrile illness or hospitalizations. He is staying active and maintaining a heart healthy diet. His sugars had been running low so he dropped his Glipizide to 1 tab bid and his Metformin to 1 tab po bid and he reports less low sugars and good control. He did this about 9 months ago. He still has an occasional sugar in the 60s but over all his numbers are within normal limits. Denies CP/palp/SOB/HA/congestion/fevers/GI or GU c/o. Taking meds as prescribed  Past Medical History:  Diagnosis Date  . Aortic calcification ([place]) 02/09/2015  . Appendicitis   . Back pain 02/09/2015  . CAD (coronary artery disease)    2 stents  . Chicken pox   . Diabetes mellitus type 2 in obese ([place]) 04/11/2016  . Diabetes type 2, controlled ([place])   . Dysphagia, unspecified(787.20) 03/20/2014  . Hereditary and idiopathic peripheral neuropathy 02/09/2015  . Hyperlipidemia   . Hypertension   . Hypothyroidism   . Light headedness 07/04/2017  . Measles   . Medicare annual wellness visit, subsequent 06/14/2015  . Melanoma ([place])    Scalp. 2012  . Mumps as a child  . Thyroid  disease    [place] Hyperthyroidism    Past Surgical History:  Procedure Laterality Date  . APPENDECTOMY    . BACK SURGERY     x2 lumbar  . BACK SURGERY     late 1990s had 2 surgeries, first surgery lifted a heavy engine ruptured disds, at L4 and L5, cleaned discs no hardware very helpful. 2 years later while recovering from angiogram with weights applied to left leg caused a recurrence and required surgery again in same area with disc repair  . CARDIAC CATHETERIZATION    . CARDIAC CATHETERIZATION N/A 05/11/2016   Procedure: Left Heart Cath and Coronary Angiography;  Surgeon: Belva Crome, MD;  Location: [place];  Service: Cardiovascular;  Laterality: N/A;  . CARDIAC CATHETERIZATION N/A 05/11/2016   Procedure: Intravascular Pressure Wire/FFR Study;  Surgeon: Belva Crome, MD;  Location: [place] [facility];  Service: Cardiovascular;  Laterality: N/A;  . CORONARY ANGIOPLASTY WITH STENT PLACEMENT     2 stents  . CORONARY ARTERY BYPASS GRAFT N/A 05/17/2016   Procedure: CORONARY ARTERY BYPASS GRAFTING (CABG) x 6 using left mammory artery and right greater saphenous vein harvested endoscopically. [place] to LAD, SVG to Diagonal, SVG Sequential to OM2 and ramus intermediate, SVG Sequential to RCA and PDA;  Surgeon: [name], MD;  Location: [facility];  Service: Open Heart Surgery;  Laterality: N/A;  . KNEE ARTHROSCOPY     right knee  . LUMBAR LAMINECTOMY/DECOMPRESSION MICRODISCECTOMY  Bilateral 04/10/2015   Procedure: LUMBAR LAMINECTOMY/DECOMPRESSION MICRODISCECTOMY 2 LEVELS;  Surgeon: Ashok Pall, MD;  Location: [facility];  Service: Neurosurgery;  Laterality: Bilateral;  Bilateral L45 L5S1 Laminectomy and Foraminotomy  . ROTATOR CUFF REPAIR Right   . SKIN CANCER EXCISION     melanoma on scalp  . TEE WITHOUT CARDIOVERSION N/A 05/17/2016   Procedure: TRANSESOPHAGEAL ECHOCARDIOGRAM (TEE);  Surgeon: [name], MD;  Location: [facility];  Service: Open Heart Surgery;  Laterality: N/A;   . TONSILLECTOMY AND ADENOIDECTOMY    . WISDOM TOOTH EXTRACTION      Family History  Problem Relation Age of Onset  . Heart disease Father   . Emphysema Father   . Hypertension Father   . Cancer Father        lung/ healthy  . Heart disease Mother        Deceased  . Cancer Mother        breast  . Hypertension Mother   . Cancer Maternal Grandmother        colon  . Diabetes Brother        type 2  . Heart disease Brother   . Pulmonary embolism Son        vasculiti  . Depression Paternal Grandmother     Social History   Socioeconomic History  . Marital status: Married    Spouse name: Not on file  . Number of children: 2  . Years of education: Not on file  . Highest education level: Not on file  Occupational History  . Not on file  Social Needs  . Financial resource strain: Not on file  . Food insecurity    Worry: Not on file    Inability: Not on file  . Transportation needs    Medical: Not on file    Non-medical: Not on file  Tobacco Use  . Smoking status: Former Smoker    Packs/day: 2.00    Years: 30.00    Pack years: 60.00    Types: Cigarettes    Start date: 12/27/1983  . Smokeless tobacco: Never Used  Substance and Sexual Activity  . Alcohol use: Yes    Alcohol/week: 0.0 standard drinks    Comment: occasionally  . Drug use: No  . 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   Lifestyle  . Physical activity    Days per week: Not on file    Minutes per session: Not on file  . Stress: Not on file  Relationships  . Social Herbalist on phone: Not on file    Gets together: Not on file    Attends religious service: Not on file    Active member of club or organization: Not on file    Attends meetings of clubs or organizations: Not on file    Relationship status: Not on file  . Intimate partner violence    Fear of current or ex partner: Not on file    Emotionally abused: Not on file     Physically abused: Not on file    Forced sexual activity: Not on file  Other Topics Concern  . Not on file  Social History Narrative  . Not on file    Outpatient Medications Prior to Visit  Medication Sig Dispense Refill  . aspirin 81 MG tablet Take 81 mg by mouth daily.    Marland Kitchen atorvastatin (LIPITOR) 80 MG tablet Take 1 tablet (80 mg total)  by mouth daily at 6 PM. 90 tablet 2  . enalapril (VASOTEC) 2.5 MG tablet TAKE 1 TABLET BY MOUTH EVERY DAY 90 tablet 0  . glipiZIDE (GLUCOTROL) 5 MG tablet Take 1 tablet (5 mg total) by mouth daily before breakfast. 1 tab po in am and 1/2 tab po in pm (Patient taking differently: Take 5 mg by mouth daily before breakfast. 1 tab po in am and 1 tab po in pm) 360 tablet 2  . levothyroxine (SYNTHROID) 100 MCG tablet TAKE 1 TABLET (100 MCG TOTAL) BY MOUTH DAILY BEFORE BREAKFAST. 90 tablet 1  . metFORMIN (GLUCOPHAGE-XR) 500 MG 24 hr tablet Take 2 tablets (1,000 mg total) by mouth 2 (two) times daily. 360 tablet 1  . ONE TOUCH LANCETS MISC Use as directed three times daily to check blood sugar.  DX E11.8 300 each 3  . ONETOUCH VERIO test strip USE AS DIRECTED ONCE DAILY TO CHECK BLOOD SUGAR. DX E11.8 100 each 2  . 0.9 %  sodium chloride infusion      No facility-administered medications prior to visit.     No Known Allergies  Review of Systems  Constitutional: Negative for fever and malaise/fatigue.  HENT: Negative for congestion.   Eyes: Negative for blurred vision.  Respiratory: Negative for shortness of breath.   Cardiovascular: Negative for chest pain, palpitations and leg swelling.  Gastrointestinal: Negative for abdominal pain, blood in stool and nausea.  Genitourinary: Negative for dysuria and frequency.  Musculoskeletal: Negative for falls.  Skin: Negative for rash.  Neurological: Negative for dizziness, loss of consciousness and headaches.  Endo/Heme/Allergies: Negative for environmental allergies.  Psychiatric/Behavioral: Negative for  depression. The patient is not nervous/anxious.        Objective:    Physical Exam unable to perform over phone visit  BP 111/67 (BP Location: Left Arm, Patient Position: Sitting, Cuff Size: Normal)   Wt 187 lb (84.8 kg)   BMI 25.36 kg/m  Wt Readings from Last 3 Encounters:  08/27/19 187 lb (84.8 kg)  03/13/19 199 lb (90.3 kg)  10/17/18 194 lb (88 kg)    Diabetic Foot Exam - Simple   No data filed     Lab Results  Component Value Date   WBC 6.7 09/18/2018   HGB 13.8 09/18/2018   HCT 41.1 09/18/2018   PLT 200.0 09/18/2018   GLUCOSE 155 (H) 09/18/2018   CHOL 87 09/18/2018   TRIG 86.0 09/18/2018   HDL 45.30 09/18/2018   LDLDIRECT 43.0 04/11/2016   LDLCALC 25 09/18/2018   ALT 18 09/18/2018   AST 23 09/18/2018   NA 139 09/18/2018   K 4.5 09/18/2018   CL 107 09/18/2018   CREATININE 1.26 09/18/2018   BUN 15 09/18/2018   CO2 26 09/18/2018   TSH 0.36 09/18/2018   PSA 0.71 03/07/2018   INR 1.61 (H) 05/17/2016   HGBA1C 7.3 (H) 09/18/2018   MICROALBUR 1.3 09/18/2018    Lab Results  Component Value Date   TSH 0.36 09/18/2018   Lab Results  Component Value Date   WBC 6.7 09/18/2018   HGB 13.8 09/18/2018   HCT 41.1 09/18/2018   MCV 83.4 09/18/2018   PLT 200.0 09/18/2018   Lab Results  Component Value Date   NA 139 09/18/2018   K 4.5 09/18/2018   CO2 26 09/18/2018   GLUCOSE 155 (H) 09/18/2018   BUN 15 09/18/2018   CREATININE 1.26 09/18/2018   BILITOT 0.4 09/18/2018   ALKPHOS 69 09/18/2018   AST 23 09/18/2018  ALT 18 09/18/2018   PROT 7.2 09/18/2018   ALBUMIN 4.4 09/18/2018   CALCIUM 9.7 09/18/2018   ANIONGAP 7 10/01/2016   GFR 59.17 (L) 09/18/2018   Lab Results  Component Value Date   CHOL 87 09/18/2018   Lab Results  Component Value Date   HDL 45.30 09/18/2018   Lab Results  Component Value Date   LDLCALC 25 09/18/2018   Lab Results  Component Value Date   TRIG 86.0 09/18/2018   Lab Results  Component Value Date   CHOLHDL 2 09/18/2018    Lab Results  Component Value Date   HGBA1C 7.3 (H) 09/18/2018       Assessment & Plan:   Problem List Items Addressed This Visit    Hyperlipidemia    Tolerating statin, encouraged heart healthy diet, avoid trans fats, minimize simple carbs and saturated fats. Increase exercise as tolerated      Relevant Orders   Lipid panel   Hypothyroidism    On Levothyroxine, continue to monitor      Relevant Orders   TSH   HTN (hypertension)    Check vitals weekly and report any concerns, no changes to meds. Encouraged heart healthy diet such as the DASH diet and exercise as tolerated.       Relevant Orders   CBC   Comprehensive metabolic panel   TSH   Hemoglobin A1c   Type 2 diabetes with complication (HCC)    His sugars had been running low so he dropped his Glipizide to 1 tab bid and his Metformin to 1 tab po bid and he reports less low sugars and good control. He did this about 9 months ago. Will recheck hgba1c to assess status      Chronic renal disease, stage III ([place])    Hydrate and monitor      Nocturia   Relevant Orders   PSA   Proteinuria    Tolerating Enalapirl       Other Visit Diagnoses    Type 2 diabetes mellitus with complication, without long-term current use of insulin ([place])    -  Primary   Relevant Orders   Hemoglobin A1c      I am having [employer]" maintain his aspirin, ONE TOUCH LANCETS, glipiZIDE, OneTouch Verio, atorvastatin, metFORMIN, levothyroxine, and enalapril. We will stop administering sodium chloride.  No orders of the defined types were placed in this encounter.    Penni Homans, MD    I discussed the assessment and treatment plan with the patient. The patient was provided an opportunity to ask questions and all were answered. The patient agreed with the plan and demonstrated an understanding of the instructions.   The patient was advised to call back or seek an in-person evaluation if the symptoms worsen or if the  condition fails to improve as anticipated.  I provided 25 minutes of non-face-to-face time during this encounter.   Penni Homans, MD

## 2019-09-02 ENCOUNTER — Encounter: Payer: Self-pay | Admitting: Family Medicine

## 2019-09-11 ENCOUNTER — Other Ambulatory Visit (INDEPENDENT_AMBULATORY_CARE_PROVIDER_SITE_OTHER): Payer: Medicare Other

## 2019-09-11 ENCOUNTER — Other Ambulatory Visit: Payer: Self-pay

## 2019-09-11 DIAGNOSIS — E785 Hyperlipidemia, unspecified: Secondary | ICD-10-CM | POA: Diagnosis not present

## 2019-09-11 DIAGNOSIS — E118 Type 2 diabetes mellitus with unspecified complications: Secondary | ICD-10-CM

## 2019-09-11 DIAGNOSIS — I10 Essential (primary) hypertension: Secondary | ICD-10-CM

## 2019-09-11 DIAGNOSIS — R351 Nocturia: Secondary | ICD-10-CM

## 2019-09-11 DIAGNOSIS — E038 Other specified hypothyroidism: Secondary | ICD-10-CM | POA: Diagnosis not present

## 2019-09-11 LAB — COMPREHENSIVE METABOLIC PANEL
ALT: 15 U/L (ref 0–53)
AST: 21 U/L (ref 0–37)
Albumin: 4.4 g/dL (ref 3.5–5.2)
Alkaline Phosphatase: 93 U/L (ref 39–117)
BUN: 18 mg/dL (ref 6–23)
CO2: 27 mEq/L (ref 19–32)
Calcium: 9.9 mg/dL (ref 8.4–10.5)
Chloride: 101 mEq/L (ref 96–112)
Creatinine, Ser: 1.51 mg/dL — ABNORMAL HIGH (ref 0.40–1.50)
GFR: 45.06 mL/min — ABNORMAL LOW (ref >60.00)
Glucose, Bld: 156 mg/dL — ABNORMAL HIGH (ref 70–99)
Potassium: 4.9 mEq/L (ref 3.5–5.1)
Sodium: 137 mEq/L (ref 135–145)
Total Bilirubin: 0.6 mg/dL (ref 0.2–1.2)
Total Protein: 6.9 g/dL (ref 6.0–8.3)

## 2019-09-11 LAB — HEMOGLOBIN A1C: Hgb A1c MFr Bld: 7.3 % — ABNORMAL HIGH (ref 4.6–6.5)

## 2019-09-11 LAB — LIPID PANEL
Cholesterol: 93 mg/dL (ref 0–200)
HDL: 39.6 mg/dL (ref >39.00)
LDL Cholesterol: 18 mg/dL (ref 0–99)
NonHDL: 53.15
Total CHOL/HDL Ratio: 2
Triglycerides: 177 mg/dL — ABNORMAL HIGH (ref 0.0–149.0)
VLDL: 35.4 mg/dL (ref 0.0–40.0)

## 2019-09-11 LAB — CBC
HCT: 40.9 % (ref 39.0–52.0)
Hemoglobin: 13.7 g/dL (ref 13.0–17.0)
MCHC: 33.5 g/dL (ref 30.0–36.0)
MCV: 83 fl (ref 78.0–100.0)
Platelets: 226 10*3/uL (ref 150.0–400.0)
RBC: 4.92 Mil/uL (ref 4.22–5.81)
RDW: 14.3 % (ref 11.5–15.5)
WBC: 8.9 10*3/uL (ref 4.0–10.5)

## 2019-09-11 LAB — TSH: TSH: 3.8 u[IU]/mL (ref 0.35–4.50)

## 2019-09-11 LAB — PSA: PSA: 0.93 ng/mL (ref 0.10–4.00)

## 2019-09-12 ENCOUNTER — Other Ambulatory Visit: Payer: Self-pay

## 2019-09-12 DIAGNOSIS — I10 Essential (primary) hypertension: Secondary | ICD-10-CM

## 2019-09-12 DIAGNOSIS — L570 Actinic keratosis: Secondary | ICD-10-CM | POA: Diagnosis not present

## 2019-09-12 DIAGNOSIS — Z85820 Personal history of malignant melanoma of skin: Secondary | ICD-10-CM | POA: Diagnosis not present

## 2019-09-12 DIAGNOSIS — D1801 Hemangioma of skin and subcutaneous tissue: Secondary | ICD-10-CM | POA: Diagnosis not present

## 2019-09-12 DIAGNOSIS — L814 Other melanin hyperpigmentation: Secondary | ICD-10-CM | POA: Diagnosis not present

## 2019-09-12 DIAGNOSIS — M71341 Other bursal cyst, right hand: Secondary | ICD-10-CM | POA: Diagnosis not present

## 2019-09-12 DIAGNOSIS — Z08 Encounter for follow-up examination after completed treatment for malignant neoplasm: Secondary | ICD-10-CM | POA: Diagnosis not present

## 2019-09-23 ENCOUNTER — Other Ambulatory Visit: Payer: Self-pay

## 2019-09-23 ENCOUNTER — Other Ambulatory Visit (INDEPENDENT_AMBULATORY_CARE_PROVIDER_SITE_OTHER): Payer: Medicare Other

## 2019-09-23 DIAGNOSIS — I10 Essential (primary) hypertension: Secondary | ICD-10-CM | POA: Diagnosis not present

## 2019-09-23 LAB — COMPREHENSIVE METABOLIC PANEL
ALT: 16 U/L (ref 0–53)
AST: 19 U/L (ref 0–37)
Albumin: 4.2 g/dL (ref 3.5–5.2)
Alkaline Phosphatase: 92 U/L (ref 39–117)
BUN: 12 mg/dL (ref 6–23)
CO2: 24 mEq/L (ref 19–32)
Calcium: 9.2 mg/dL (ref 8.4–10.5)
Chloride: 108 mEq/L (ref 96–112)
Creatinine, Ser: 1.28 mg/dL (ref 0.40–1.50)
GFR: 54.52 mL/min — ABNORMAL LOW (ref >60.00)
Glucose, Bld: 93 mg/dL (ref 70–99)
Potassium: 4.2 mEq/L (ref 3.5–5.1)
Sodium: 141 mEq/L (ref 135–145)
Total Bilirubin: 0.5 mg/dL (ref 0.2–1.2)
Total Protein: 6.7 g/dL (ref 6.0–8.3)

## 2019-10-23 DIAGNOSIS — H2513 Age-related nuclear cataract, bilateral: Secondary | ICD-10-CM | POA: Diagnosis not present

## 2019-10-23 DIAGNOSIS — H52223 Regular astigmatism, bilateral: Secondary | ICD-10-CM | POA: Diagnosis not present

## 2019-10-23 DIAGNOSIS — H5203 Hypermetropia, bilateral: Secondary | ICD-10-CM | POA: Diagnosis not present

## 2019-10-23 DIAGNOSIS — H11043 Peripheral pterygium, stationary, bilateral: Secondary | ICD-10-CM | POA: Diagnosis not present

## 2019-10-23 DIAGNOSIS — E119 Type 2 diabetes mellitus without complications: Secondary | ICD-10-CM | POA: Diagnosis not present

## 2019-10-23 DIAGNOSIS — E113292 Type 2 diabetes mellitus with mild nonproliferative diabetic retinopathy without macular edema, left eye: Secondary | ICD-10-CM | POA: Diagnosis not present

## 2019-10-23 DIAGNOSIS — H524 Presbyopia: Secondary | ICD-10-CM | POA: Diagnosis not present

## 2019-11-01 ENCOUNTER — Other Ambulatory Visit: Payer: Self-pay | Admitting: Family Medicine

## 2019-11-15 ENCOUNTER — Encounter: Payer: Self-pay | Admitting: Family Medicine

## 2019-11-18 MED ORDER — GLIPIZIDE 5 MG PO TABS: 5.0000 mg | ORAL_TABLET | Freq: Every day | ORAL | 2 refills | Status: DC

## 2019-12-02 DIAGNOSIS — Z1159 Encounter for screening for other viral diseases: Secondary | ICD-10-CM | POA: Diagnosis not present

## 2020-01-09 ENCOUNTER — Other Ambulatory Visit: Payer: Self-pay | Admitting: Family Medicine

## 2020-01-27 ENCOUNTER — Other Ambulatory Visit: Payer: Self-pay | Admitting: Family Medicine

## 2020-02-12 DIAGNOSIS — M5432 Sciatica, left side: Secondary | ICD-10-CM | POA: Diagnosis not present

## 2020-03-09 NOTE — Progress Notes (Signed)
HPI: FU coronary artery disease. Lower extremity Dopplers February 2016 showed an occluded right anterior tibial artery. Abdominal ultrasound February 2016 showed no aneurysm. Patient had cardiac catheterization May 2017 secondary to continued exertional chest pain. He was found to have severe three-vessel coronary artery disease and normal LV function. Echocardiogram showed normal LV systolic function, grade 1 diastolic dysfunction and mildly dilated aortic root. Note carotid Dopplers May 2017 Prior to surgery showed 1-39% bilateral stenosis. Patient had coronary artery bypass and graft with LIMA to the LAD, saphenous vein graft to second diagonal, sequential saphenous vein graft to the intermediate and distal circumflex, and sequential saphenous vein graft to the right coronary artery and PDA. Since he was last seen, the patient has dyspnea with more extreme activities but not with routine activities. It is relieved with rest. It is not associated with chest pain. There is no orthopnea, PND or pedal edema. There is no syncope or palpitations. There is no exertional chest pain.   Current Outpatient Medications  Medication Sig Dispense Refill  . aspirin 81 MG tablet Take 81 mg by mouth daily.    Marland Kitchen atorvastatin (LIPITOR) 80 MG tablet Take 1 tablet (80 mg total) by mouth daily at 6 PM. 90 tablet 2  . enalapril (VASOTEC) 2.5 MG tablet TAKE 1 TABLET BY MOUTH EVERY DAY 90 tablet 0  . glipiZIDE (GLUCOTROL) 5 MG tablet Take 1 tablet (5 mg total) by mouth daily before breakfast. 1 tab po in am and 1/2 tab po in pm 360 tablet 2  . levothyroxine (SYNTHROID) 100 MCG tablet TAKE 1 TABLET (100 MCG TOTAL) BY MOUTH DAILY BEFORE BREAKFAST. 90 tablet 1  . metFORMIN (GLUCOPHAGE-XR) 500 MG 24 hr tablet TAKE 2 TABLETS BY MOUTH TWICE A DAY 360 tablet 1  . ONE TOUCH LANCETS MISC Use as directed three times daily to check blood sugar.  DX E11.8 300 each 3  . ONETOUCH VERIO test strip USE AS DIRECTED ONCE DAILY TO  CHECK BLOOD SUGAR. DX E11.8 100 each 2   No current facility-administered medications for this visit.     Past Medical History:  Diagnosis Date  . Aortic calcification ([place]) 02/09/2015  . Appendicitis   . Back pain 02/09/2015  . CAD (coronary artery disease)    2 stents  . Chicken pox   . Diabetes mellitus type 2 in obese ([place]) 04/11/2016  . Diabetes type 2, controlled ([place])   . Dysphagia, unspecified(787.20) 03/20/2014  . Hereditary and idiopathic peripheral neuropathy 02/09/2015  . Hyperlipidemia   . Hypertension   . Hypothyroidism   . Light headedness 07/04/2017  . Measles   . Medicare annual wellness visit, subsequent 06/14/2015  . Melanoma ([place])    Scalp. 2012  . Mumps as a child  . Thyroid disease    [place] Hyperthyroidism    Past Surgical History:  Procedure Laterality Date  . APPENDECTOMY    . BACK SURGERY     x2 lumbar  . BACK SURGERY     late 1990s had 2 surgeries, first surgery lifted a heavy engine ruptured disds, at L4 and L5, cleaned discs no hardware very helpful. 2 years later while recovering from angiogram with weights applied to left leg caused a recurrence and required surgery again in same area with disc repair  . CARDIAC CATHETERIZATION    . CARDIAC CATHETERIZATION N/A 05/11/2016   Procedure: Left Heart Cath and Coronary Angiography;  Surgeon: Belva Crome, MD;  Location: [facility];  Service: Cardiovascular;  Laterality: N/A;  . CARDIAC CATHETERIZATION N/A 05/11/2016   Procedure: Intravascular Pressure Wire/FFR Study;  Surgeon: Belva Crome, MD;  Location: [facility];  Service: Cardiovascular;  Laterality: N/A;  . CORONARY ANGIOPLASTY WITH STENT PLACEMENT     2 stents  . CORONARY ARTERY BYPASS GRAFT N/A 05/17/2016   Procedure: CORONARY ARTERY BYPASS GRAFTING (CABG) x 6 using left mammory artery and right greater saphenous vein harvested endoscopically. [place] to LAD, SVG to Diagonal, SVG Sequential to OM2 and ramus intermediate, SVG  Sequential to RCA and PDA;  Surgeon: [name], MD;  Location: [facility];  Service: Open Heart Surgery;  Laterality: N/A;  . KNEE ARTHROSCOPY     right knee  . LUMBAR LAMINECTOMY/DECOMPRESSION MICRODISCECTOMY Bilateral 04/10/2015   Procedure: LUMBAR LAMINECTOMY/DECOMPRESSION MICRODISCECTOMY 2 LEVELS;  Surgeon: Ashok Pall, MD;  Location: [facility];  Service: Neurosurgery;  Laterality: Bilateral;  Bilateral L45 L5S1 Laminectomy and Foraminotomy  . ROTATOR CUFF REPAIR Right   . SKIN CANCER EXCISION     melanoma on scalp  . TEE WITHOUT CARDIOVERSION N/A 05/17/2016   Procedure: TRANSESOPHAGEAL ECHOCARDIOGRAM (TEE);  Surgeon: [name], MD;  Location: [facility];  Service: Open Heart Surgery;  Laterality: N/A;  . TONSILLECTOMY AND ADENOIDECTOMY    . WISDOM TOOTH EXTRACTION      Social History   Socioeconomic History  . Marital status: Married    Spouse name: Not on file  . Number of children: 2  . Years of education: Not on file  . Highest education level: Not on file  Occupational History  . Not on file  Tobacco Use  . Smoking status: Former Smoker    Packs/day: 2.00    Years: 30.00    Pack years: 60.00    Types: Cigarettes    Start date: 12/27/1983  . Smokeless tobacco: Never Used  Substance and Sexual Activity  . Alcohol use: Yes    Alcohol/week: 0.0 standard drinks    Comment: occasionally  . Drug use: No  . 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   Other Topics Concern  . Not on file  Social History Narrative  . Not on file   Social Determinants of Health   Financial Resource Strain:   . Difficulty of Paying Living Expenses:   Food Insecurity:   . Worried About Charity fundraiser in the Last Year:   . Arboriculturist in the Last Year:   Transportation Needs:   . Film/video editor (Medical):   Marland Kitchen Lack of Transportation (Non-Medical):   Physical Activity:   . Days of Exercise per Week:   .  Minutes of Exercise per Session:   Stress:   . Feeling of Stress :   Social Connections:   . Frequency of Communication with Friends and Family:   . Frequency of Social Gatherings with Friends and Family:   . Attends Religious Services:   . Active Member of Clubs or Organizations:   . Attends Archivist Meetings:   Marland Kitchen Marital Status:   Intimate Partner Violence:   . Fear of Current or Ex-Partner:   . Emotionally Abused:   Marland Kitchen Physically Abused:   . Sexually Abused:     Family History  Problem Relation Age of Onset  . Heart disease Father   . Emphysema Father   . Hypertension Father   . Cancer Father  lung/ healthy  . Heart disease Mother        Deceased  . Cancer Mother        breast  . Hypertension Mother   . Cancer Maternal Grandmother        colon  . Diabetes Brother        type 2  . Heart disease Brother   . Pulmonary embolism Son        vasculiti  . Depression Paternal Grandmother     ROS: no fevers or chills, productive cough, hemoptysis, dysphasia, odynophagia, melena, hematochezia, dysuria, hematuria, rash, seizure activity, orthopnea, PND, pedal edema, claudication. Remaining systems are negative.  Physical Exam: Well-developed well-nourished in no acute distress.  Skin is warm and dry.  HEENT is normal.  Neck is supple.  Chest is clear to auscultation with normal expansion.  Cardiovascular exam is regular rate and rhythm.  Abdominal exam nontender or distended. No masses palpated. Extremities show no edema. neuro grossly intact  ECG-sinus rhythm with occasional PVC, right bundle branch block.  Personally reviewed  A/P  1 coronary artery disease-patient denies recurrent chest pain.  Plan to continue medical therapy with aspirin and statin.  2 hyperlipidemia-continue statin.  3 peripheral vascular disease-patient doing well with no claudication.  Plan medical therapy with aspirin and statin.  4 history of orthostatic hypotension-we  discussed the importance of continuing liberal fluid intake and sodium intake.  Kirk Ruths, MD

## 2020-03-18 ENCOUNTER — Ambulatory Visit (INDEPENDENT_AMBULATORY_CARE_PROVIDER_SITE_OTHER): Payer: Medicare Other | Admitting: Cardiology

## 2020-03-18 ENCOUNTER — Other Ambulatory Visit: Payer: Self-pay

## 2020-03-18 ENCOUNTER — Encounter: Payer: Self-pay | Admitting: Cardiology

## 2020-03-18 VITALS — BP 116/62 | HR 73 | Ht 72.0 in | Wt 194.0 lb

## 2020-03-18 DIAGNOSIS — I739 Peripheral vascular disease, unspecified: Secondary | ICD-10-CM | POA: Diagnosis not present

## 2020-03-18 DIAGNOSIS — I2510 Atherosclerotic heart disease of native coronary artery without angina pectoris: Secondary | ICD-10-CM | POA: Diagnosis not present

## 2020-03-18 DIAGNOSIS — E7800 Pure hypercholesterolemia, unspecified: Secondary | ICD-10-CM

## 2020-03-18 NOTE — Patient Instructions (Signed)
Medication Instructions:  NO CHANGE *If you need a refill on your cardiac medications before your next appointment, please call your pharmacy*   Lab Work: If you have labs (blood work) drawn today and your tests are completely normal, you will receive your results only by: . MyChart Message (if you have MyChart) OR . A paper copy in the mail If you have any lab test that is abnormal or we need to change your treatment, we will call you to review the results.  Follow-Up: At [facility], you and your health needs are our priority.  As part of our continuing mission to provide you with exceptional heart care, we have created designated Provider Care Teams.  These Care Teams include your primary Cardiologist (physician) and Advanced Practice Providers (APPs -  Physician Assistants and Nurse Practitioners) who all work together to provide you with the care you need, when you need it.  We recommend signing up for the patient portal called "MyChart".  Sign up information is provided on this After Visit Summary.  MyChart is used to connect with patients for Virtual Visits (Telemedicine).  Patients are able to view lab/test results, encounter notes, upcoming appointments, etc.  Non-urgent messages can be sent to your provider as well.   To learn more about what you can do with MyChart, go to [email or web address].    Your next appointment:   12 month(s)  The format for your next appointment:   Either In Person or Virtual  Provider:   Brian Crenshaw, MD    

## 2020-04-06 DIAGNOSIS — M25552 Pain in left hip: Secondary | ICD-10-CM | POA: Diagnosis not present

## 2020-04-06 DIAGNOSIS — Z6826 Body mass index (BMI) 26.0-26.9, adult: Secondary | ICD-10-CM | POA: Diagnosis not present

## 2020-04-06 DIAGNOSIS — R030 Elevated blood-pressure reading, without diagnosis of hypertension: Secondary | ICD-10-CM | POA: Diagnosis not present

## 2020-04-19 ENCOUNTER — Other Ambulatory Visit: Payer: Self-pay | Admitting: Family Medicine

## 2020-04-23 ENCOUNTER — Other Ambulatory Visit: Payer: Self-pay | Admitting: Cardiology

## 2020-04-23 DIAGNOSIS — E785 Hyperlipidemia, unspecified: Secondary | ICD-10-CM

## 2020-04-29 DIAGNOSIS — M9983 Other biomechanical lesions of lumbar region: Secondary | ICD-10-CM | POA: Diagnosis not present

## 2020-04-29 DIAGNOSIS — M47816 Spondylosis without myelopathy or radiculopathy, lumbar region: Secondary | ICD-10-CM | POA: Diagnosis not present

## 2020-04-29 DIAGNOSIS — M48061 Spinal stenosis, lumbar region without neurogenic claudication: Secondary | ICD-10-CM | POA: Diagnosis not present

## 2020-04-29 DIAGNOSIS — M5127 Other intervertebral disc displacement, lumbosacral region: Secondary | ICD-10-CM | POA: Diagnosis not present

## 2020-05-04 DIAGNOSIS — M48061 Spinal stenosis, lumbar region without neurogenic claudication: Secondary | ICD-10-CM | POA: Diagnosis not present

## 2020-05-04 DIAGNOSIS — Z6825 Body mass index (BMI) 25.0-25.9, adult: Secondary | ICD-10-CM | POA: Diagnosis not present

## 2020-05-04 DIAGNOSIS — R030 Elevated blood-pressure reading, without diagnosis of hypertension: Secondary | ICD-10-CM | POA: Diagnosis not present

## 2020-07-04 ENCOUNTER — Other Ambulatory Visit: Payer: Self-pay | Admitting: Family Medicine

## 2020-09-04 DIAGNOSIS — Z23 Encounter for immunization: Secondary | ICD-10-CM | POA: Diagnosis not present

## 2020-09-24 ENCOUNTER — Ambulatory Visit (INDEPENDENT_AMBULATORY_CARE_PROVIDER_SITE_OTHER): Payer: Medicare Other | Admitting: Family Medicine

## 2020-09-24 ENCOUNTER — Other Ambulatory Visit: Payer: Self-pay

## 2020-09-24 VITALS — BP 110/62 | HR 67 | Temp 97.6°F | Resp 15 | Wt 178.8 lb

## 2020-09-24 DIAGNOSIS — E118 Type 2 diabetes mellitus with unspecified complications: Secondary | ICD-10-CM | POA: Diagnosis not present

## 2020-09-24 DIAGNOSIS — I10 Essential (primary) hypertension: Secondary | ICD-10-CM | POA: Diagnosis not present

## 2020-09-24 DIAGNOSIS — R351 Nocturia: Secondary | ICD-10-CM

## 2020-09-24 DIAGNOSIS — E038 Other specified hypothyroidism: Secondary | ICD-10-CM | POA: Diagnosis not present

## 2020-09-24 DIAGNOSIS — E7800 Pure hypercholesterolemia, unspecified: Secondary | ICD-10-CM | POA: Diagnosis not present

## 2020-09-24 DIAGNOSIS — I2510 Atherosclerotic heart disease of native coronary artery without angina pectoris: Secondary | ICD-10-CM

## 2020-09-24 DIAGNOSIS — K5900 Constipation, unspecified: Secondary | ICD-10-CM | POA: Diagnosis not present

## 2020-09-24 DIAGNOSIS — N183 Chronic kidney disease, stage 3 unspecified: Secondary | ICD-10-CM

## 2020-09-24 NOTE — Assessment & Plan Note (Addendum)
hgba1c acceptable, minimize simple carbs. Increase exercise as tolerated. Continue current meds. He agrees to try the libre glucose monitoring system if his insurance will cover it. He tried to get the Shingrix shot this year but his insurance would not cover it so he is advised to try again after the new year to see if his formulary changes.

## 2020-09-24 NOTE — Assessment & Plan Note (Signed)
Notes increased trouble with constipation since his colonoscopy. He is only moving his bowels once a week. Encouraged increased hydration and fiber in diet. Daily probiotics. If bowels not moving can use MOM 2 tbls po in 4 oz of warm prune juice by mouth every 2-3 days. If no results then repeat in 4 hours with  Dulcolax suppository pr, may repeat again in 4 more hours as needed. Seek care if symptoms worsen. Consider daily Miralax and/or Dulcolax if symptoms persist. Use miralax/benefiber 1-3 x a day to achieve comfortable BM every one to two days

## 2020-09-24 NOTE — Assessment & Plan Note (Signed)
Tolerating statin, encouraged heart healthy diet, avoid trans fats, minimize simple carbs and saturated fats. Increase exercise as tolerated 

## 2020-09-24 NOTE — Progress Notes (Signed)
Subjective:    Patient ID: Jack Wade, male    DOB: 1941/03/22, 79 y.o.   MRN: [id number]  Chief Complaint  Patient presents with  . Follow-up     Type 2 DM    HPI Patient is in today for follow up on chronic medical concerns. No recent febrile illness or hospitalizations. He has not been checking his blood sugars as he does not believe it helps and just makes his fingers sore. No polyuria or polydipsia. Is trying to eat well and stay active on a good day. Denies CP/palp/SOB/HA/congestion/fevers/GI c/o. Taking meds as prescribed  Past Medical History:  Diagnosis Date  . Aortic calcification ([place]) 02/09/2015  . Appendicitis   . Back pain 02/09/2015  . CAD (coronary artery disease)    2 stents  . Chicken pox   . Diabetes mellitus type 2 in obese ([place]) 04/11/2016  . Diabetes type 2, controlled ([place])   . Dysphagia, unspecified(787.20) 03/20/2014  . Hereditary and idiopathic peripheral neuropathy 02/09/2015  . Hyperlipidemia   . Hypertension   . Hypothyroidism   . Light headedness 07/04/2017  . Measles   . Medicare annual wellness visit, subsequent 06/14/2015  . Melanoma ([place])    Scalp. 2012  . Mumps as a child  . Thyroid disease    [place] Hyperthyroidism    Past Surgical History:  Procedure Laterality Date  . APPENDECTOMY    . BACK SURGERY     x2 lumbar  . BACK SURGERY     late 1990s had 2 surgeries, first surgery lifted a heavy engine ruptured disds, at L4 and L5, cleaned discs no hardware very helpful. 2 years later while recovering from angiogram with weights applied to left leg caused a recurrence and required surgery again in same area with disc repair  . CARDIAC CATHETERIZATION    . CARDIAC CATHETERIZATION N/A 05/11/2016   Procedure: Left Heart Cath and Coronary Angiography;  Surgeon: Belva Crome, MD;  Location: [facility];  Service: Cardiovascular;  Laterality: N/A;  . CARDIAC CATHETERIZATION N/A 05/11/2016   Procedure: Intravascular Pressure Wire/FFR  Study;  Surgeon: Belva Crome, MD;  Location: [facility];  Service: Cardiovascular;  Laterality: N/A;  . CORONARY ANGIOPLASTY WITH STENT PLACEMENT     2 stents  . CORONARY ARTERY BYPASS GRAFT N/A 05/17/2016   Procedure: CORONARY ARTERY BYPASS GRAFTING (CABG) x 6 using left mammory artery and right greater saphenous vein harvested endoscopically. [place] to LAD, SVG to Diagonal, SVG Sequential to OM2 and ramus intermediate, SVG Sequential to RCA and PDA;  Surgeon: [name], MD;  Location: [facility];  Service: Open Heart Surgery;  Laterality: N/A;  . KNEE ARTHROSCOPY     right knee  . LUMBAR LAMINECTOMY/DECOMPRESSION MICRODISCECTOMY Bilateral 04/10/2015   Procedure: LUMBAR LAMINECTOMY/DECOMPRESSION MICRODISCECTOMY 2 LEVELS;  Surgeon: Ashok Pall, MD;  Location: [facility];  Service: Neurosurgery;  Laterality: Bilateral;  Bilateral L45 L5S1 Laminectomy and Foraminotomy  . ROTATOR CUFF REPAIR Right   . SKIN CANCER EXCISION     melanoma on scalp  . TEE WITHOUT CARDIOVERSION N/A 05/17/2016   Procedure: TRANSESOPHAGEAL ECHOCARDIOGRAM (TEE);  Surgeon: [name], MD;  Location: [facility];  Service: Open Heart Surgery;  Laterality: N/A;  . TONSILLECTOMY AND ADENOIDECTOMY    . WISDOM TOOTH EXTRACTION      Family History  Problem Relation Age of Onset  . Heart disease Father   . Emphysema Father   . Hypertension Father   . Cancer  Father        lung/ healthy  . Heart disease Mother        Deceased  . Cancer Mother        breast  . Hypertension Mother   . Cancer Maternal Grandmother        colon  . Diabetes Brother        type 2  . Heart disease Brother   . Pulmonary embolism Son        vasculiti  . Depression Paternal Grandmother     Social History   Socioeconomic History  . Marital status: Married    Spouse name: Not on file  . Number of children: 2  . Years of education: Not on file  . Highest education level: Not on file  Occupational History  . Not on file    Tobacco Use  . Smoking status: Former Smoker    Packs/day: 2.00    Years: 30.00    Pack years: 60.00    Types: Cigarettes    Start date: 12/27/1983  . Smokeless tobacco: Never Used  Vaping Use  . Vaping Use: Never used  Substance and Sexual Activity  . Alcohol use: Yes    Alcohol/week: 0.0 standard drinks    Comment: occasionally  . Drug use: No  . 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   Other Topics Concern  . Not on file  Social History Narrative  . Not on file   Social Determinants of Health   Financial Resource Strain:   . Difficulty of Paying Living Expenses: Not on file  Food Insecurity:   . Worried About Charity fundraiser in the Last Year: Not on file  . Ran Out of Food in the Last Year: Not on file  Transportation Needs:   . Lack of Transportation (Medical): Not on file  . Lack of Transportation (Non-Medical): Not on file  Physical Activity:   . Days of Exercise per Week: Not on file  . Minutes of Exercise per Session: Not on file  Stress:   . Feeling of Stress : Not on file  Social Connections:   . Frequency of Communication with Friends and Family: Not on file  . Frequency of Social Gatherings with Friends and Family: Not on file  . Attends Religious Services: Not on file  . Active Member of Clubs or Organizations: Not on file  . Attends Archivist Meetings: Not on file  . Marital Status: Not on file  Intimate Partner Violence:   . Fear of Current or Ex-Partner: Not on file  . Emotionally Abused: Not on file  . Physically Abused: Not on file  . Sexually Abused: Not on file    Outpatient Medications Prior to Visit  Medication Sig Dispense Refill  . aspirin 81 MG tablet Take 81 mg by mouth daily.    Marland Kitchen atorvastatin (LIPITOR) 80 MG tablet TAKE 1 TABLET (80 MG TOTAL) BY MOUTH DAILY AT 6 PM. 90 tablet 2  . glipiZIDE (GLUCOTROL) 5 MG tablet Take 1 tablet (5 mg total) by mouth daily  before breakfast. 1 tab po in am and 1/2 tab po in pm (Patient taking differently: Take 5 mg by mouth daily before breakfast. 1 tab po in am and 1 tab po in pm) 360 tablet 2  . levothyroxine (SYNTHROID) 100 MCG tablet Take 1 tablet (100 mcg total) by mouth daily before breakfast. 90 tablet 0  . metFORMIN (  GLUCOPHAGE-XR) 500 MG 24 hr tablet Take 2 tablets (1,000 mg total) by mouth 2 (two) times daily. 360 tablet 0  . ONE TOUCH LANCETS MISC Use as directed three times daily to check blood sugar.  DX E11.8 300 each 3  . ONETOUCH VERIO test strip USE AS DIRECTED ONCE DAILY TO CHECK BLOOD SUGAR. DX E11.8 100 each 2  . enalapril (VASOTEC) 2.5 MG tablet Take 1 tablet (2.5 mg total) by mouth daily. NEEDS OV/FOLLOW UP BEFORE ANY MORE REFILLS (Patient not taking: Reported on 09/24/2020) 90 tablet 0   No facility-administered medications prior to visit.    No Known Allergies  Review of Systems  Constitutional: Positive for malaise/fatigue. Negative for fever.  HENT: Negative for congestion.   Eyes: Negative for blurred vision.  Respiratory: Negative for shortness of breath.   Cardiovascular: Negative for chest pain, palpitations and leg swelling.  Gastrointestinal: Positive for constipation. Negative for abdominal pain, blood in stool, diarrhea and nausea.  Genitourinary: Negative for dysuria and frequency.  Musculoskeletal: Negative for falls.  Skin: Negative for rash.  Neurological: Negative for dizziness, loss of consciousness and headaches.  Endo/Heme/Allergies: Negative for environmental allergies.  Psychiatric/Behavioral: Negative for depression. The patient is not nervous/anxious.        Objective:    Physical Exam Vitals and nursing note reviewed.  Constitutional:      General: He is not in acute distress.    Appearance: He is well-developed.  HENT:     Head: Normocephalic and atraumatic.     Nose: Nose normal.  Eyes:     General:        Right eye: No discharge.        Left eye:  No discharge.  Cardiovascular:     Rate and Rhythm: Normal rate and regular rhythm.     Heart sounds: No murmur heard.   Pulmonary:     Effort: Pulmonary effort is normal.     Breath sounds: Normal breath sounds.  Abdominal:     General: Bowel sounds are normal.     Palpations: Abdomen is soft.     Tenderness: There is no abdominal tenderness.  Musculoskeletal:     Cervical back: Normal range of motion and neck supple.  Skin:    General: Skin is warm and dry.  Neurological:     Mental Status: He is alert and oriented to person, place, and time.     BP 110/62 (BP Location: Left Arm, Patient Position: Sitting, Cuff Size: Large)   Pulse 67   Temp 97.6 F (36.4 C) (Oral)   Resp 15   Wt 178 lb 12.8 oz (81.1 kg)   SpO2 100%   BMI 24.25 kg/m  Wt Readings from Last 3 Encounters:  09/24/20 178 lb 12.8 oz (81.1 kg)  03/18/20 194 lb (88 kg)  08/27/19 187 lb (84.8 kg)    Diabetic Foot Exam - Simple   No data filed     Lab Results  Component Value Date   WBC 5.6 09/24/2020   HGB 14.2 09/24/2020   HCT 44.1 09/24/2020   PLT 219 09/24/2020   GLUCOSE 93 09/23/2019   CHOL 93 09/11/2019   TRIG 177.0 (H) 09/11/2019   HDL 39.60 09/11/2019   LDLDIRECT 43.0 04/11/2016   LDLCALC 18 09/11/2019   ALT 16 09/23/2019   AST 19 09/23/2019   NA 141 09/23/2019   K 4.2 09/23/2019   CL 108 09/23/2019   CREATININE 1.28 09/23/2019   BUN 12 09/23/2019  CO2 24 09/23/2019   TSH 3.80 09/11/2019   PSA 0.93 09/11/2019   INR 1.61 (H) 05/17/2016   HGBA1C 7.3 (H) 09/11/2019   MICROALBUR 1.3 09/18/2018    Lab Results  Component Value Date   TSH 3.80 09/11/2019   Lab Results  Component Value Date   WBC 5.6 09/24/2020   HGB 14.2 09/24/2020   HCT 44.1 09/24/2020   MCV 84.5 09/24/2020   PLT 219 09/24/2020   Lab Results  Component Value Date   NA 141 09/23/2019   K 4.2 09/23/2019   CO2 24 09/23/2019   GLUCOSE 93 09/23/2019   BUN 12 09/23/2019   CREATININE 1.28 09/23/2019   BILITOT  0.5 09/23/2019   ALKPHOS 92 09/23/2019   AST 19 09/23/2019   ALT 16 09/23/2019   PROT 6.7 09/23/2019   ALBUMIN 4.2 09/23/2019   CALCIUM 9.2 09/23/2019   ANIONGAP 7 10/01/2016   GFR 54.52 (L) 09/23/2019   Lab Results  Component Value Date   CHOL 93 09/11/2019   Lab Results  Component Value Date   HDL 39.60 09/11/2019   Lab Results  Component Value Date   LDLCALC 18 09/11/2019   Lab Results  Component Value Date   TRIG 177.0 (H) 09/11/2019   Lab Results  Component Value Date   CHOLHDL 2 09/11/2019   Lab Results  Component Value Date   HGBA1C 7.3 (H) 09/11/2019       Assessment & Plan:   Problem List Items Addressed This Visit    Hyperlipidemia - Primary    Tolerating statin, encouraged heart healthy diet, avoid trans fats, minimize simple carbs and saturated fats. Increase exercise as tolerated      Relevant Orders   Lipid panel   Hypothyroidism    On Levothyroxine, continue to monitor      HTN (hypertension)    Well controlled, no changes to meds. Encouraged heart healthy diet such as the DASH diet and exercise as tolerated.       Relevant Orders   CBC (Completed)   Comprehensive metabolic panel   TSH   Type 2 diabetes with complication (HCC)    [id number] acceptable, minimize simple carbs. Increase exercise as tolerated. Continue current meds. He agrees to try the libre glucose monitoring system if his insurance will cover it. He tried to get the Shingrix shot this year but his insurance would not cover it so he is advised to try again after the new year to see if his formulary changes.       Relevant Orders   Hemoglobin A1c   Chronic renal disease, stage III    Hydrate and monitor      Nocturia    Check psa.      Relevant Orders   PSA   Constipation    Notes increased trouble with constipation since his colonoscopy. He is only moving his bowels once a week. Encouraged increased hydration and fiber in diet. Daily probiotics. If bowels not moving  can use MOM 2 tbls po in 4 oz of warm prune juice by mouth every 2-3 days. If no results then repeat in 4 hours with  Dulcolax suppository pr, may repeat again in 4 more hours as needed. Seek care if symptoms worsen. Consider daily Miralax and/or Dulcolax if symptoms persist. Use miralax/benefiber 1-3 x a day to achieve comfortable BM every one to two days         I am having Jack Wade [employer]" maintain his aspirin, ONE TOUCH  LANCETS, OneTouch Verio, glipiZIDE, enalapril, atorvastatin, metFORMIN, and levothyroxine.  No orders of the defined types were placed in this encounter.    Penni Homans, MD

## 2020-09-24 NOTE — Assessment & Plan Note (Signed)
Hydrate and monitor 

## 2020-09-24 NOTE — Assessment & Plan Note (Signed)
Check psa 

## 2020-09-24 NOTE — Patient Instructions (Signed)
Encouraged increased hydration and fiber in diet. Daily probiotics. If bowels not moving can use MOM 2 tbls po in 4 oz of warm prune juice by mouth every 2-3 days. If no results then repeat in 4 hours with  Dulcolax suppository pr, may repeat again in 4 more hours as needed. Seek care if symptoms worsen. Consider daily Miralax and/or Dulcolax if symptoms persist.   Try daily Miralax with benefiber once to three x a day Constipation, Adult Constipation is when a person has fewer bowel movements in a week than normal, has difficulty having a bowel movement, or has stools that are dry, hard, or larger than normal. Constipation may be caused by an underlying condition. It may become worse with age if a person takes certain medicines and does not take in enough fluids. Follow these instructions at home: Eating and drinking   Eat foods that have a lot of fiber, such as fresh fruits and vegetables, whole grains, and beans.  Limit foods that are high in fat, low in fiber, or overly processed, such as french fries, hamburgers, cookies, candies, and soda.  Drink enough fluid to keep your urine clear or pale yellow. General instructions  Exercise regularly or as told by your health care provider.  Go to the restroom when you have the urge to go. Do not hold it in.  Take over-the-counter and prescription medicines only as told by your health care provider. These include any fiber supplements.  Practice pelvic floor retraining exercises, such as deep breathing while relaxing the lower abdomen and pelvic floor relaxation during bowel movements.  Watch your condition for any changes.  Keep all follow-up visits as told by your health care provider. This is important. Contact a health care provider if:  You have pain that gets worse.  You have a fever.  You do not have a bowel movement after 4 days.  You vomit.  You are not hungry.  You lose weight.  You are bleeding from the anus.  You  have thin, pencil-like stools. Get help right away if:  You have a fever and your symptoms suddenly get worse.  You leak stool or have blood in your stool.  Your abdomen is bloated.  You have severe pain in your abdomen.  You feel dizzy or you faint. This information is not intended to replace advice given to you by your health care provider. Make sure you discuss any questions you have with your health care provider. Document Revised: 11/24/2017 Document Reviewed: 06/01/2016 Elsevier Patient Education  [place]. fiber once to twice a day and titrate to able to move the bowels

## 2020-09-24 NOTE — Assessment & Plan Note (Signed)
Well controlled, no changes to meds. Encouraged heart healthy diet such as the DASH diet and exercise as tolerated.  °

## 2020-09-24 NOTE — Assessment & Plan Note (Signed)
On Levothyroxine, continue to monitor 

## 2020-09-25 ENCOUNTER — Other Ambulatory Visit: Payer: Self-pay

## 2020-09-25 ENCOUNTER — Other Ambulatory Visit: Payer: Self-pay | Admitting: *Deleted

## 2020-09-25 DIAGNOSIS — E038 Other specified hypothyroidism: Secondary | ICD-10-CM

## 2020-09-25 DIAGNOSIS — E7800 Pure hypercholesterolemia, unspecified: Secondary | ICD-10-CM

## 2020-09-25 DIAGNOSIS — E785 Hyperlipidemia, unspecified: Secondary | ICD-10-CM

## 2020-09-25 DIAGNOSIS — E118 Type 2 diabetes mellitus with unspecified complications: Secondary | ICD-10-CM

## 2020-09-25 DIAGNOSIS — I10 Essential (primary) hypertension: Secondary | ICD-10-CM

## 2020-09-25 LAB — LIPID PANEL
Cholesterol: 100 mg/dL (ref <200)
HDL: 44 mg/dL (ref >=40)
LDL Cholesterol (Calc): 33 mg/dL (calc)
Non-HDL Cholesterol (Calc): 56 mg/dL (calc) (ref <130)
Total CHOL/HDL Ratio: 2.3 (calc) (ref <5.0)
Triglycerides: 147 mg/dL (ref <150)

## 2020-09-25 LAB — CBC
HCT: 44.1 % (ref 38.5–50.0)
Hemoglobin: 14.2 g/dL (ref 13.2–17.1)
MCH: 27.2 pg (ref 27.0–33.0)
MCHC: 32.2 g/dL (ref 32.0–36.0)
MCV: 84.5 fL (ref 80.0–100.0)
MPV: 9.6 fL (ref 7.5–12.5)
Platelets: 219 10*3/uL (ref 140–400)
RBC: 5.22 10*6/uL (ref 4.20–5.80)
RDW: 13.1 % (ref 11.0–15.0)
WBC: 5.6 10*3/uL (ref 3.8–10.8)

## 2020-09-25 LAB — COMPREHENSIVE METABOLIC PANEL
AG Ratio: 1.7 (calc) (ref 1.0–2.5)
ALT: 12 U/L (ref 9–46)
AST: 15 U/L (ref 10–35)
Albumin: 4.3 g/dL (ref 3.6–5.1)
Alkaline phosphatase (APISO): 99 U/L (ref 35–144)
BUN/Creatinine Ratio: 11 (calc) (ref 6–22)
BUN: 13 mg/dL (ref 7–25)
CO2: 25 mmol/L (ref 20–32)
Calcium: 9.6 mg/dL (ref 8.6–10.3)
Chloride: 107 mmol/L (ref 98–110)
Creat: 1.22 mg/dL — ABNORMAL HIGH (ref 0.70–1.18)
Globulin: 2.6 g/dL (calc) (ref 1.9–3.7)
Glucose, Bld: 86 mg/dL (ref 65–99)
Potassium: 4.6 mmol/L (ref 3.5–5.3)
Sodium: 142 mmol/L (ref 135–146)
Total Bilirubin: 0.7 mg/dL (ref 0.2–1.2)
Total Protein: 6.9 g/dL (ref 6.1–8.1)

## 2020-09-25 LAB — HEMOGLOBIN A1C
Hgb A1c MFr Bld: 7.6 % of total Hgb — ABNORMAL HIGH (ref <5.7)
Mean Plasma Glucose: 171 (calc)
eAG (mmol/L): 9.5 (calc)

## 2020-09-25 LAB — PSA: PSA: 0.91 ng/mL (ref <=4.0)

## 2020-09-25 LAB — TSH: TSH: 4.97 mIU/L — ABNORMAL HIGH (ref 0.40–4.50)

## 2020-09-25 MED ORDER — FREESTYLE LIBRE 14 DAY READER DEVI: 0 refills | Status: DC

## 2020-09-25 MED ORDER — FREESTYLE LIBRE 14 DAY SENSOR MISC: 5 refills | Status: DC

## 2020-10-08 ENCOUNTER — Other Ambulatory Visit: Payer: Self-pay | Admitting: Family Medicine

## 2020-10-08 DIAGNOSIS — L814 Other melanin hyperpigmentation: Secondary | ICD-10-CM | POA: Diagnosis not present

## 2020-10-08 DIAGNOSIS — Z85820 Personal history of malignant melanoma of skin: Secondary | ICD-10-CM | POA: Diagnosis not present

## 2020-10-08 DIAGNOSIS — L570 Actinic keratosis: Secondary | ICD-10-CM | POA: Diagnosis not present

## 2020-10-08 DIAGNOSIS — W908XXS Exposure to other nonionizing radiation, sequela: Secondary | ICD-10-CM | POA: Diagnosis not present

## 2020-10-08 DIAGNOSIS — L578 Other skin changes due to chronic exposure to nonionizing radiation: Secondary | ICD-10-CM | POA: Diagnosis not present

## 2020-10-21 DIAGNOSIS — Z23 Encounter for immunization: Secondary | ICD-10-CM | POA: Diagnosis not present

## 2020-10-26 DIAGNOSIS — H5203 Hypermetropia, bilateral: Secondary | ICD-10-CM | POA: Diagnosis not present

## 2020-10-26 DIAGNOSIS — H02055 Trichiasis without entropian left lower eyelid: Secondary | ICD-10-CM | POA: Diagnosis not present

## 2020-10-26 DIAGNOSIS — E113292 Type 2 diabetes mellitus with mild nonproliferative diabetic retinopathy without macular edema, left eye: Secondary | ICD-10-CM | POA: Diagnosis not present

## 2020-10-26 DIAGNOSIS — H52223 Regular astigmatism, bilateral: Secondary | ICD-10-CM | POA: Diagnosis not present

## 2020-10-26 DIAGNOSIS — E119 Type 2 diabetes mellitus without complications: Secondary | ICD-10-CM | POA: Diagnosis not present

## 2020-10-26 DIAGNOSIS — H524 Presbyopia: Secondary | ICD-10-CM | POA: Diagnosis not present

## 2020-11-30 ENCOUNTER — Other Ambulatory Visit: Payer: Self-pay | Admitting: Family Medicine

## 2020-12-10 DIAGNOSIS — H02055 Trichiasis without entropian left lower eyelid: Secondary | ICD-10-CM | POA: Diagnosis not present

## 2020-12-10 DIAGNOSIS — H52211 Irregular astigmatism, right eye: Secondary | ICD-10-CM | POA: Diagnosis not present

## 2020-12-10 DIAGNOSIS — H04123 Dry eye syndrome of bilateral lacrimal glands: Secondary | ICD-10-CM | POA: Diagnosis not present

## 2020-12-10 DIAGNOSIS — H11043 Peripheral pterygium, stationary, bilateral: Secondary | ICD-10-CM | POA: Diagnosis not present

## 2021-01-01 ENCOUNTER — Other Ambulatory Visit (INDEPENDENT_AMBULATORY_CARE_PROVIDER_SITE_OTHER): Payer: Medicare Other

## 2021-01-01 ENCOUNTER — Other Ambulatory Visit: Payer: Self-pay

## 2021-01-01 DIAGNOSIS — I10 Essential (primary) hypertension: Secondary | ICD-10-CM | POA: Diagnosis not present

## 2021-01-01 DIAGNOSIS — E038 Other specified hypothyroidism: Secondary | ICD-10-CM

## 2021-01-01 DIAGNOSIS — E7800 Pure hypercholesterolemia, unspecified: Secondary | ICD-10-CM | POA: Diagnosis not present

## 2021-01-01 DIAGNOSIS — E118 Type 2 diabetes mellitus with unspecified complications: Secondary | ICD-10-CM | POA: Diagnosis not present

## 2021-01-01 DIAGNOSIS — E785 Hyperlipidemia, unspecified: Secondary | ICD-10-CM

## 2021-01-01 LAB — CBC
HCT: 41.8 % (ref 39.0–52.0)
Hemoglobin: 14 g/dL (ref 13.0–17.0)
MCHC: 33.5 g/dL (ref 30.0–36.0)
MCV: 83.2 fl (ref 78.0–100.0)
Platelets: 204 10*3/uL (ref 150.0–400.0)
RBC: 5.03 Mil/uL (ref 4.22–5.81)
RDW: 13.7 % (ref 11.5–15.5)
WBC: 6 10*3/uL (ref 4.0–10.5)

## 2021-01-01 LAB — HEMOGLOBIN A1C: Hgb A1c MFr Bld: 7.8 % — ABNORMAL HIGH (ref 4.6–6.5)

## 2021-01-01 LAB — COMPREHENSIVE METABOLIC PANEL
ALT: 13 U/L (ref 0–53)
AST: 15 U/L (ref 0–37)
Albumin: 4.1 g/dL (ref 3.5–5.2)
Alkaline Phosphatase: 101 U/L (ref 39–117)
BUN: 16 mg/dL (ref 6–23)
CO2: 28 mEq/L (ref 19–32)
Calcium: 9 mg/dL (ref 8.4–10.5)
Chloride: 103 mEq/L (ref 96–112)
Creatinine, Ser: 1.36 mg/dL (ref 0.40–1.50)
GFR: 49.99 mL/min — ABNORMAL LOW (ref >60.00)
Glucose, Bld: 185 mg/dL — ABNORMAL HIGH (ref 70–99)
Potassium: 4.5 mEq/L (ref 3.5–5.1)
Sodium: 137 mEq/L (ref 135–145)
Total Bilirubin: 0.6 mg/dL (ref 0.2–1.2)
Total Protein: 6.4 g/dL (ref 6.0–8.3)

## 2021-01-01 LAB — LIPID PANEL
Cholesterol: 73 mg/dL (ref 0–200)
HDL: 32.2 mg/dL — ABNORMAL LOW (ref >39.00)
LDL Cholesterol: 28 mg/dL (ref 0–99)
NonHDL: 40.81
Total CHOL/HDL Ratio: 2
Triglycerides: 66 mg/dL (ref 0.0–149.0)
VLDL: 13.2 mg/dL (ref 0.0–40.0)

## 2021-01-01 LAB — TSH: TSH: 3.96 u[IU]/mL (ref 0.35–4.50)

## 2021-01-02 ENCOUNTER — Encounter: Payer: Self-pay | Admitting: Family Medicine

## 2021-01-04 ENCOUNTER — Other Ambulatory Visit: Payer: Self-pay

## 2021-01-04 DIAGNOSIS — E118 Type 2 diabetes mellitus with unspecified complications: Secondary | ICD-10-CM

## 2021-01-04 MED ORDER — GLIPIZIDE 5 MG PO TABS: ORAL_TABLET | ORAL | 1 refills | Status: DC

## 2021-01-06 DIAGNOSIS — H2511 Age-related nuclear cataract, right eye: Secondary | ICD-10-CM | POA: Diagnosis not present

## 2021-01-06 DIAGNOSIS — H2513 Age-related nuclear cataract, bilateral: Secondary | ICD-10-CM | POA: Diagnosis not present

## 2021-01-07 ENCOUNTER — Telehealth (INDEPENDENT_AMBULATORY_CARE_PROVIDER_SITE_OTHER): Payer: Medicare Other | Admitting: Family Medicine

## 2021-01-07 ENCOUNTER — Other Ambulatory Visit: Payer: Self-pay

## 2021-01-07 DIAGNOSIS — I10 Essential (primary) hypertension: Secondary | ICD-10-CM | POA: Diagnosis not present

## 2021-01-07 DIAGNOSIS — E038 Other specified hypothyroidism: Secondary | ICD-10-CM | POA: Diagnosis not present

## 2021-01-07 DIAGNOSIS — E785 Hyperlipidemia, unspecified: Secondary | ICD-10-CM | POA: Diagnosis not present

## 2021-01-07 DIAGNOSIS — E118 Type 2 diabetes mellitus with unspecified complications: Secondary | ICD-10-CM | POA: Diagnosis not present

## 2021-01-07 MED ORDER — METFORMIN HCL ER 500 MG PO TB24
1000.0000 mg | ORAL_TABLET | Freq: Two times a day (BID) | ORAL | 1 refills | Status: DC
Start: 2021-01-07 — End: 2021-10-19

## 2021-01-07 MED ORDER — LEVOTHYROXINE SODIUM 100 MCG PO TABS: 100.0000 ug | ORAL_TABLET | Freq: Every day | ORAL | 1 refills | Status: DC

## 2021-01-07 MED ORDER — ATORVASTATIN CALCIUM 80 MG PO TABS: 80.0000 mg | ORAL_TABLET | Freq: Every day | ORAL | 1 refills | Status: DC

## 2021-01-10 NOTE — Assessment & Plan Note (Signed)
On Levothyroxine, continue to monitor. TSH improved, refill given on Levothyroxine.

## 2021-01-10 NOTE — Assessment & Plan Note (Signed)
hgba1c acceptable but has trended up, minimize simple carbs. Increase exercise as tolerated. Continue current meds, discussed changing meds but he acknowledges he has not been eating well so is committed to cutting down on carbohydrates and increasing activity. Will recheck labs in 3 months

## 2021-01-10 NOTE — Assessment & Plan Note (Signed)
Well controlled, no changes to meds. Encouraged heart healthy diet such as the DASH diet and exercise as tolerated.  °

## 2021-01-10 NOTE — Progress Notes (Signed)
Virtual Visit via Video Note  I connected with Jack Wade on 01/07/21 at  3:20 PM EST by a video enabled telemedicine application and verified that I am speaking with the correct person using two identifiers.  Location: Patient: home, patient and provider in visit Provider: office   I discussed the limitations of evaluation and management by telemedicine and the availability of in person appointments. The patient expressed understanding and agreed to proceed. S Chism, CMA was able to get the patient set up on a video visit.   Subjective:    Patient ID: Jack Wade, male    DOB: [birth year], 80 y.o.   MRN: [id number]  No chief complaint on file.   HPI Patient is in today for follow up on chronic medical concerns. No recent febrile illness or hospitalizations. No polyuria or polydipsia. He notes he has not been good about his diet and he thinks that is why his sugar is up. He has been exercising regularly. Denies CP/palp/SOB/HA/congestion/fevers/GI or GU c/o. Taking meds as prescribed  Past Medical History:  Diagnosis Date  . Aortic calcification ([place]) 02/09/2015  . Appendicitis   . Back pain 02/09/2015  . CAD (coronary artery disease)    2 stents  . Chicken pox   . Diabetes mellitus type 2 in obese ([place]) 04/11/2016  . Diabetes type 2, controlled ([place])   . Dysphagia, unspecified(787.20) 03/20/2014  . Hereditary and idiopathic peripheral neuropathy 02/09/2015  . Hyperlipidemia   . Hypertension   . Hypothyroidism   . Light headedness 07/04/2017  . Measles   . Medicare annual wellness visit, subsequent 06/14/2015  . Melanoma ([place])    Scalp. 2012  . Mumps as a child  . Thyroid disease    [place] Hyperthyroidism    Past Surgical History:  Procedure Laterality Date  . APPENDECTOMY    . BACK SURGERY     x2 lumbar  . BACK SURGERY     late 1990s had 2 surgeries, first surgery lifted a heavy engine ruptured disds, at L4 and L5, cleaned discs no hardware very helpful. 2  years later while recovering from angiogram with weights applied to left leg caused a recurrence and required surgery again in same area with disc repair  . CARDIAC CATHETERIZATION    . CARDIAC CATHETERIZATION N/A 05/11/2016   Procedure: Left Heart Cath and Coronary Angiography;  Surgeon: Belva Crome, MD;  Location: [place] [facility];  Service: Cardiovascular;  Laterality: N/A;  . CARDIAC CATHETERIZATION N/A 05/11/2016   Procedure: Intravascular Pressure Wire/FFR Study;  Surgeon: Belva Crome, MD;  Location: [place] [facility];  Service: Cardiovascular;  Laterality: N/A;  . CORONARY ANGIOPLASTY WITH STENT PLACEMENT     2 stents  . CORONARY ARTERY BYPASS GRAFT N/A 05/17/2016   Procedure: CORONARY ARTERY BYPASS GRAFTING (CABG) x 6 using left mammory artery and right greater saphenous vein harvested endoscopically. [place] to LAD, SVG to Diagonal, SVG Sequential to OM2 and ramus intermediate, SVG Sequential to RCA and PDA;  Surgeon: [name], MD;  Location: [facility];  Service: Open Heart Surgery;  Laterality: N/A;  . KNEE ARTHROSCOPY     right knee  . LUMBAR LAMINECTOMY/DECOMPRESSION MICRODISCECTOMY Bilateral 04/10/2015   Procedure: LUMBAR LAMINECTOMY/DECOMPRESSION MICRODISCECTOMY 2 LEVELS;  Surgeon: Ashok Pall, MD;  Location: [facility] [facility];  Service: Neurosurgery;  Laterality: Bilateral;  Bilateral L45 L5S1 Laminectomy and Foraminotomy  . ROTATOR CUFF REPAIR Right   . SKIN CANCER EXCISION     melanoma on scalp  .  TEE WITHOUT CARDIOVERSION N/A 05/17/2016   Procedure: TRANSESOPHAGEAL ECHOCARDIOGRAM (TEE);  Surgeon: [name], MD;  Location: [place];  Service: Open Heart Surgery;  Laterality: N/A;  . TONSILLECTOMY AND ADENOIDECTOMY    . WISDOM TOOTH EXTRACTION      Family History  Problem Relation Age of Onset  . Heart disease Father   . Emphysema Father   . Hypertension Father   . Cancer Father        lung/ healthy  . Heart disease Mother        Deceased  . Cancer Mother         breast  . Hypertension Mother   . Cancer Maternal Grandmother        colon  . Diabetes Brother        type 2  . Heart disease Brother   . Pulmonary embolism Son        vasculiti  . Depression Paternal Grandmother     Social History   Socioeconomic History  . Marital status: Married    Spouse name: Not on file  . Number of children: 2  . Years of education: Not on file  . Highest education level: Not on file  Occupational History  . Not on file  Tobacco Use  . Smoking status: Former Smoker    Packs/day: 2.00    Years: 30.00    Pack years: 60.00    Types: Cigarettes    Start date: 12/27/1983  . Smokeless tobacco: Never Used  Vaping Use  . Vaping Use: Never used  Substance and Sexual Activity  . Alcohol use: Yes    Alcohol/week: 0.0 standard drinks    Comment: occasionally  . Drug use: No  . 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   Other Topics Concern  . Not on file  Social History Narrative  . Not on file   Social Determinants of Health   Financial Resource Strain: Not on file  Food Insecurity: Not on file  Transportation Needs: Not on file  Physical Activity: Not on file  Stress: Not on file  Social Connections: Not on file  Intimate Partner Violence: Not on file    Outpatient Medications Prior to Visit  Medication Sig Dispense Refill  . aspirin 81 MG tablet Take 81 mg by mouth daily.    . Continuous Blood Gluc Receiver (FREESTYLE LIBRE 14 DAY READER) DEVI Use as directed.  Dx code: E11.9 1 each 0  . Continuous Blood Gluc Sensor (FREESTYLE LIBRE 14 DAY SENSOR) MISC Use as directed.  Dx code: E11.9 2 each 5  . enalapril (VASOTEC) 2.5 MG tablet Take 1 tablet (2.5 mg total) by mouth daily. NEEDS OV/FOLLOW UP BEFORE ANY MORE REFILLS (Patient not taking: Reported on 09/24/2020) 90 tablet 0  . glipiZIDE (GLUCOTROL) 5 MG tablet TAKE 1 TABLET EVERY MORNING BEFORE BREAKFAST AND TAKE 1TABLET IN THE IN THE  EVENING 180 tablet 1  . ONE TOUCH LANCETS MISC Use as directed three times daily to check blood sugar.  DX E11.8 300 each 3  . ONETOUCH VERIO test strip USE AS DIRECTED ONCE DAILY TO CHECK BLOOD SUGAR. DX E11.8 100 each 2  . atorvastatin (LIPITOR) 80 MG tablet TAKE 1 TABLET (80 MG TOTAL) BY MOUTH DAILY AT 6 PM. 90 tablet 2  . levothyroxine (SYNTHROID) 100 MCG tablet TAKE 1 TABLET BY MOUTH DAILY BEFORE BREAKFAST. 90 tablet 1  . metFORMIN (GLUCOPHAGE-XR) 500 MG 24 hr  tablet TAKE 2 TABLETS BY MOUTH TWICE A DAY 360 tablet 1   No facility-administered medications prior to visit.    No Known Allergies  Review of Systems  Constitutional: Negative for fever and malaise/fatigue.  HENT: Negative for congestion.   Eyes: Negative for blurred vision.  Respiratory: Negative for shortness of breath.   Cardiovascular: Negative for chest pain, palpitations and leg swelling.  Gastrointestinal: Negative for abdominal pain, blood in stool and nausea.  Genitourinary: Negative for dysuria and frequency.  Musculoskeletal: Negative for falls.  Skin: Negative for rash.  Neurological: Negative for dizziness, loss of consciousness and headaches.  Endo/Heme/Allergies: Negative for environmental allergies.  Psychiatric/Behavioral: Negative for depression. The patient is not nervous/anxious.        Objective:    Physical Exam Constitutional:      Appearance: Normal appearance. He is not ill-appearing.  HENT:     Head: Normocephalic and atraumatic.     Right Ear: External ear normal.     Left Ear: External ear normal.     Nose: Nose normal.  Eyes:     General:        Right eye: No discharge.        Left eye: No discharge.  Pulmonary:     Effort: Pulmonary effort is normal.  Neurological:     Mental Status: He is alert and oriented to person, place, and time.  Psychiatric:        Behavior: Behavior normal.     BP 125/74   Pulse 64  Wt Readings from Last 3 Encounters:  09/24/20 178 lb 12.8 oz  (81.1 kg)  03/18/20 194 lb (88 kg)  08/27/19 187 lb (84.8 kg)    Diabetic Foot Exam - Simple   No data filed    Lab Results  Component Value Date   WBC 6.0 01/01/2021   HGB 14.0 01/01/2021   HCT 41.8 01/01/2021   PLT 204.0 01/01/2021   GLUCOSE 185 (H) 01/01/2021   CHOL 73 01/01/2021   TRIG 66.0 01/01/2021   HDL 32.20 (L) 01/01/2021   LDLDIRECT 43.0 04/11/2016   LDLCALC 28 01/01/2021   ALT 13 01/01/2021   AST 15 01/01/2021   NA 137 01/01/2021   K 4.5 01/01/2021   CL 103 01/01/2021   CREATININE 1.36 01/01/2021   BUN 16 01/01/2021   CO2 28 01/01/2021   TSH 3.96 01/01/2021   PSA 0.91 09/24/2020   INR 1.61 (H) 05/17/2016   HGBA1C 7.8 (H) 01/01/2021   MICROALBUR 1.3 09/18/2018    Lab Results  Component Value Date   TSH 3.96 01/01/2021   Lab Results  Component Value Date   WBC 6.0 01/01/2021   HGB 14.0 01/01/2021   HCT 41.8 01/01/2021   MCV 83.2 01/01/2021   PLT 204.0 01/01/2021   Lab Results  Component Value Date   NA 137 01/01/2021   K 4.5 01/01/2021   CO2 28 01/01/2021   GLUCOSE 185 (H) 01/01/2021   BUN 16 01/01/2021   CREATININE 1.36 01/01/2021   BILITOT 0.6 01/01/2021   ALKPHOS 101 01/01/2021   AST 15 01/01/2021   ALT 13 01/01/2021   PROT 6.4 01/01/2021   ALBUMIN 4.1 01/01/2021   CALCIUM 9.0 01/01/2021   ANIONGAP 7 10/01/2016   GFR 49.99 (L) 01/01/2021   Lab Results  Component Value Date   CHOL 73 01/01/2021   Lab Results  Component Value Date   HDL 32.20 (L) 01/01/2021   Lab Results  Component Value Date  [place] 28 01/01/2021   Lab Results  Component Value Date   TRIG 66.0 01/01/2021   Lab Results  Component Value Date   CHOLHDL 2 01/01/2021   Lab Results  Component Value Date   HGBA1C 7.8 (H) 01/01/2021       Assessment & Plan:   Problem List Items Addressed This Visit    Hyperlipidemia    Tolerating statin, encouraged heart healthy diet, avoid trans fats, minimize simple carbs and saturated fats. Increase exercise as  tolerated. Refill given on Atorvastatin      Relevant Medications   atorvastatin (LIPITOR) 80 MG tablet   Hypothyroidism    On Levothyroxine, continue to monitor. TSH improved, refill given on Levothyroxine.       Relevant Medications   levothyroxine (SYNTHROID) 100 MCG tablet   HTN (hypertension)    Well controlled, no changes to meds. Encouraged heart healthy diet such as the DASH diet and exercise as tolerated.       Relevant Medications   atorvastatin (LIPITOR) 80 MG tablet   Type 2 diabetes with complication (HCC)    QQPY1P acceptable but has trended up, minimize simple carbs. Increase exercise as tolerated. Continue current meds, discussed changing meds but he acknowledges he has not been eating well so is committed to cutting down on carbohydrates and increasing activity. Will recheck labs in 3 months      Relevant Medications   metFORMIN (GLUCOPHAGE-XR) 500 MG 24 hr tablet   atorvastatin (LIPITOR) 80 MG tablet      I have discontinued Gwyndolyn Saxon C. Aamodt "Bill"'s levothyroxine. I have also changed his metFORMIN. Additionally, I am having him start on levothyroxine. Lastly, I am having him maintain his aspirin, ONE TOUCH LANCETS, OneTouch Verio, enalapril, FreeStyle Libre 14 Day Reader, FreeStyle Libre 14 Day Sensor, glipiZIDE, and atorvastatin.  Meds ordered this encounter  Medications  . metFORMIN (GLUCOPHAGE-XR) 500 MG 24 hr tablet    Sig: Take 2 tablets (1,000 mg total) by mouth 2 (two) times daily.    Dispense:  360 tablet    Refill:  1  . atorvastatin (LIPITOR) 80 MG tablet    Sig: Take 1 tablet (80 mg total) by mouth daily at 6 PM.    Dispense:  90 tablet    Refill:  1  . levothyroxine (SYNTHROID) 100 MCG tablet    Sig: Take 1 tablet (100 mcg total) by mouth daily.    Dispense:  90 tablet    Refill:  1     I discussed the assessment and treatment plan with the patient. The patient was provided an opportunity to ask questions and all were answered. The  patient agreed with the plan and demonstrated an understanding of the instructions.   The patient was advised to call back or seek an in-person evaluation if the symptoms worsen or if the condition fails to improve as anticipated.  I provided 20 minutes of non-face-to-face time during this encounter.   Penni Homans, MD

## 2021-01-10 NOTE — Assessment & Plan Note (Signed)
Tolerating statin, encouraged heart healthy diet, avoid trans fats, minimize simple carbs and saturated fats. Increase exercise as tolerated. Refill given on Atorvastatin

## 2021-01-26 DIAGNOSIS — H2511 Age-related nuclear cataract, right eye: Secondary | ICD-10-CM | POA: Diagnosis not present

## 2021-02-04 DIAGNOSIS — H2512 Age-related nuclear cataract, left eye: Secondary | ICD-10-CM | POA: Diagnosis not present

## 2021-02-16 DIAGNOSIS — H2512 Age-related nuclear cataract, left eye: Secondary | ICD-10-CM | POA: Diagnosis not present

## 2021-03-30 ENCOUNTER — Ambulatory Visit (INDEPENDENT_AMBULATORY_CARE_PROVIDER_SITE_OTHER): Payer: Medicare Other | Admitting: Family Medicine

## 2021-03-30 ENCOUNTER — Other Ambulatory Visit: Payer: Self-pay

## 2021-03-30 ENCOUNTER — Encounter: Payer: Self-pay | Admitting: Family Medicine

## 2021-03-30 VITALS — BP 110/70 | HR 65 | Temp 97.7°F | Resp 16 | Wt 200.4 lb

## 2021-03-30 DIAGNOSIS — I10 Essential (primary) hypertension: Secondary | ICD-10-CM | POA: Diagnosis not present

## 2021-03-30 DIAGNOSIS — E7800 Pure hypercholesterolemia, unspecified: Secondary | ICD-10-CM

## 2021-03-30 DIAGNOSIS — E038 Other specified hypothyroidism: Secondary | ICD-10-CM

## 2021-03-30 DIAGNOSIS — E118 Type 2 diabetes mellitus with unspecified complications: Secondary | ICD-10-CM

## 2021-03-30 NOTE — Progress Notes (Signed)
Patient ID: Jack Wade, male    DOB: 03-02-1941  Age: 80 y.o. MRN: [id number]    Subjective:  Subjective  HPI Jack Wade presents for office visit today. He reports that he is doing fine physically and denies any recent sicknesses or ER visits, but he is still in the process of getting his glucometer and his glucose strips. He endorses walking 2-3 miles a day, 3-4 times a week. He endorses being well dehydrated with his fluids. He denies any chest pain, SOB, fever, abdominal pain, cough, chills, sore throat, dysuria, urinary incontinence, back pain, HA, or N/VD. He reports that he has done cataracts surgery bilaterally by Dr. Bing Plume at the [facility].   He denies any trouble with his LE bilaterally. He reports that he has had surgery on his back. He states when he stretches his feet bilaterally he describes the sensation as "stretched bands" and he notes that it got worse after his back surgery.   Review of Systems  Constitutional: Negative for chills, fatigue and fever.  HENT: Negative for congestion, rhinorrhea, sinus pressure, sinus pain and sore throat.   Eyes: Negative for pain.  Respiratory: Negative for cough and shortness of breath.   Cardiovascular: Negative for chest pain, palpitations and leg swelling.  Gastrointestinal: Negative for abdominal pain, blood in stool, diarrhea, nausea and vomiting.  Genitourinary: Negative for flank pain, frequency and penile pain.  Musculoskeletal: Negative for back pain.  Neurological: Negative for headaches.    History Past Medical History:  Diagnosis Date  . Aortic calcification ([place]) 02/09/2015  . Appendicitis   . Back pain 02/09/2015  . CAD (coronary artery disease)    2 stents  . Cataract    bilateral  . Chicken pox   . Diabetes mellitus type 2 in obese ([place]) 04/11/2016  . Diabetes type 2, controlled ([place])   . Dysphagia, unspecified(787.20) 03/20/2014  . Hereditary and idiopathic peripheral neuropathy  02/09/2015  . Hyperlipidemia   . Hypertension   . Hypothyroidism   . Light headedness 07/04/2017  . Measles   . Medicare annual wellness visit, subsequent 06/14/2015  . Melanoma ([place])    Scalp. 2012  . Mumps as a child  . Thyroid disease    [place] Hyperthyroidism    He has a past surgical history that includes Appendectomy; Coronary angioplasty with stent; Cardiac catheterization; Back surgery; Rotator cuff repair (Right); Wisdom tooth extraction; Tonsillectomy and adenoidectomy; Skin cancer excision; Back surgery; Knee arthroscopy; Lumbar laminectomy/decompression microdiscectomy (Bilateral, 04/10/2015); Cardiac catheterization (N/A, 05/11/2016); Cardiac catheterization (N/A, 05/11/2016); Coronary artery bypass graft (N/A, 05/17/2016); and TEE without cardioversion (N/A, 05/17/2016).   His family history includes Cancer in his father, maternal grandmother, and mother; Depression in his paternal aunt and paternal grandmother; Diabetes in his brother; Emphysema in his father; Heart disease in his brother, father, and mother; Hypertension in his father and mother; Pulmonary embolism in his son.He reports that he has quit smoking. His smoking use included cigarettes. He started smoking about 37 years ago. He has a 60.00 pack-year smoking history. He has never used smokeless tobacco. He reports current alcohol use. He reports that he does not use drugs.  Current Outpatient Medications on File Prior to Visit  Medication Sig Dispense Refill  . aspirin 81 MG tablet Take 81 mg by mouth daily.    Marland Kitchen atorvastatin (LIPITOR) 80 MG tablet Take 1 tablet (80 mg total) by mouth daily at 6 PM. 90 tablet 1  . Continuous Blood Gluc Receiver (FREESTYLE LIBRE 14  DAY READER) DEVI Use as directed.  Dx code: E11.9 1 each 0  . Continuous Blood Gluc Sensor (FREESTYLE LIBRE 14 DAY SENSOR) MISC Use as directed.  Dx code: E11.9 2 each 5  . glipiZIDE (GLUCOTROL) 5 MG tablet TAKE 1 TABLET EVERY MORNING BEFORE BREAKFAST AND TAKE  1TABLET IN THE IN THE EVENING 180 tablet 1  . levothyroxine (SYNTHROID) 100 MCG tablet Take 1 tablet (100 mcg total) by mouth daily. 90 tablet 1  . metFORMIN (GLUCOPHAGE-XR) 500 MG 24 hr tablet Take 2 tablets (1,000 mg total) by mouth 2 (two) times daily. 360 tablet 1  . ONE TOUCH LANCETS MISC Use as directed three times daily to check blood sugar.  DX E11.8 300 each 3  . ONETOUCH VERIO test strip USE AS DIRECTED ONCE DAILY TO CHECK BLOOD SUGAR. DX E11.8 100 each 2   No current facility-administered medications on file prior to visit.     Objective:  Objective  Physical Exam Constitutional:      General: He is not in acute distress.    Appearance: Normal appearance. He is not ill-appearing or toxic-appearing.  HENT:     Head: Normocephalic and atraumatic.     Right Ear: Tympanic membrane, ear canal and external ear normal.     Left Ear: Tympanic membrane, ear canal and external ear normal.     Nose: No congestion or rhinorrhea.  Eyes:     Extraocular Movements: Extraocular movements intact.     Pupils: Pupils are equal, round, and reactive to light.  Cardiovascular:     Rate and Rhythm: Normal rate and regular rhythm.     Pulses: Normal pulses.     Heart sounds: Normal heart sounds. No murmur heard.   Pulmonary:     Effort: Pulmonary effort is normal. No respiratory distress.     Breath sounds: Normal breath sounds. No wheezing, rhonchi or rales.  Abdominal:     General: Bowel sounds are normal.     Palpations: Abdomen is soft. There is no mass.     Tenderness: There is no abdominal tenderness. There is no guarding.     Hernia: No hernia is present.  Musculoskeletal:        General: Normal range of motion.     Cervical back: Normal range of motion and neck supple.  Feet:     Right foot:     Skin integrity: Skin integrity normal.     Left foot:     Skin integrity: Skin integrity normal.     Comments:  -Right foot numb on top, but feeling in right heel and toes were  spared -Diminished sensation on lateral left foot, otherwise normal Skin:    General: Skin is warm and dry.  Neurological:     Mental Status: He is alert and oriented to person, place, and time.  Psychiatric:        Behavior: Behavior normal.    BP 110/70   Pulse 65   Temp 97.7 F (36.5 C)   Resp 16   Wt 200 lb 6.4 oz (90.9 kg)   SpO2 98%   BMI 27.18 kg/m  Wt Readings from Last 3 Encounters:  03/30/21 200 lb 6.4 oz (90.9 kg)  09/24/20 178 lb 12.8 oz (81.1 kg)  03/18/20 194 lb (88 kg)     Lab Results  Component Value Date   WBC 7.9 04/02/2021   HGB 14.8 04/02/2021   HCT 43.8 04/02/2021   PLT 219.0 04/02/2021   GLUCOSE 213 (  H) 04/02/2021   CHOL 79 04/02/2021   TRIG 127.0 04/02/2021   HDL 32.50 (L) 04/02/2021   LDLDIRECT 43.0 04/11/2016   LDLCALC 21 04/02/2021   ALT 16 04/02/2021   AST 19 04/02/2021   NA 135 04/02/2021   K 4.8 04/02/2021   CL 99 04/02/2021   CREATININE 1.44 04/02/2021   BUN 17 04/02/2021   CO2 28 04/02/2021   TSH 3.94 04/02/2021   PSA 0.91 09/24/2020   INR 1.61 (H) 05/17/2016   HGBA1C 7.8 (H) 04/02/2021   MICROALBUR 1.3 09/18/2018    DG Foot Complete Left  Result Date: 11/10/2016 CLINICAL DATA:  Pain to left foot the starts on dorsal aspect of Left foot and then wraps around entire left foot. No known injury. EXAM: LEFT FOOT - COMPLETE 3+ VIEW COMPARISON:  None. FINDINGS: Degenerative narrowing of the interphalangeal joints. Erosion or subcortical cyst along the lateral portion of the head of the proximal phalanx of the great toe. Lisfranc joint alignment normal.  Vascular calcifications noted. IMPRESSION: 1. Primarily degenerative findings with narrowing of interphalangeal articular space, although possible peripheral erosion along the head of the proximal phalanx great toe. Electronically Signed   By: Van Clines M.D.   On: 11/10/2016 10:27     Assessment & Plan:  Plan    No orders of the defined types were placed in this  encounter.   Problem List Items Addressed This Visit    Hyperlipidemia - Primary    Tolerating statin, encouraged heart healthy diet, avoid trans fats, minimize simple carbs and saturated fats. Increase exercise as tolerated      Relevant Orders   Lipid panel (Completed)   Hypothyroidism    On Levothyroxine, continue to monitor      Relevant Orders   TSH (Completed)   HTN (hypertension)    Well controlled, no changes to meds. Encouraged heart healthy diet such as the DASH diet and exercise as tolerated.       Relevant Orders   CBC (Completed)   Comprehensive metabolic panel (Completed)   Type 2 diabetes with complication (HCC)    VEHM0N acceptable, minimize simple carbs. Increase exercise as tolerated. Continue current meds. Follows with [facility]      Relevant Orders   Hemoglobin A1c (Completed)      Follow-up: Return in about 6 months (around 09/29/2021) for annual exam.   I,David Hanna,acting as a scribe for Penni Homans, MD.,have documented all relevant documentation on the behalf of Penni Homans, MD,as directed by  Penni Homans, MD while in the presence of Penni Homans, MD.  I, Mosie Lukes, MD personally performed the services described in this documentation. All medical record entries made by the scribe were at my direction and in my presence. I have reviewed the chart and agree that the record reflects my personal performance and is accurate and complete

## 2021-03-30 NOTE — Patient Instructions (Signed)

## 2021-04-02 ENCOUNTER — Other Ambulatory Visit (INDEPENDENT_AMBULATORY_CARE_PROVIDER_SITE_OTHER): Payer: Medicare Other

## 2021-04-02 ENCOUNTER — Other Ambulatory Visit: Payer: Self-pay

## 2021-04-02 DIAGNOSIS — I10 Essential (primary) hypertension: Secondary | ICD-10-CM

## 2021-04-02 DIAGNOSIS — E038 Other specified hypothyroidism: Secondary | ICD-10-CM | POA: Diagnosis not present

## 2021-04-02 DIAGNOSIS — E7800 Pure hypercholesterolemia, unspecified: Secondary | ICD-10-CM | POA: Diagnosis not present

## 2021-04-02 DIAGNOSIS — E118 Type 2 diabetes mellitus with unspecified complications: Secondary | ICD-10-CM | POA: Diagnosis not present

## 2021-04-02 LAB — CBC
HCT: 43.8 % (ref 39.0–52.0)
Hemoglobin: 14.8 g/dL (ref 13.0–17.0)
MCHC: 33.8 g/dL (ref 30.0–36.0)
MCV: 84.2 fl (ref 78.0–100.0)
Platelets: 219 10*3/uL (ref 150.0–400.0)
RBC: 5.2 Mil/uL (ref 4.22–5.81)
RDW: 13.9 % (ref 11.5–15.5)
WBC: 7.9 10*3/uL (ref 4.0–10.5)

## 2021-04-02 LAB — COMPREHENSIVE METABOLIC PANEL
ALT: 16 U/L (ref 0–53)
AST: 19 U/L (ref 0–37)
Albumin: 4.3 g/dL (ref 3.5–5.2)
Alkaline Phosphatase: 113 U/L (ref 39–117)
BUN: 17 mg/dL (ref 6–23)
CO2: 28 mEq/L (ref 19–32)
Calcium: 10 mg/dL (ref 8.4–10.5)
Chloride: 99 mEq/L (ref 96–112)
Creatinine, Ser: 1.44 mg/dL (ref 0.40–1.50)
GFR: 46.59 mL/min — ABNORMAL LOW (ref >60.00)
Glucose, Bld: 213 mg/dL — ABNORMAL HIGH (ref 70–99)
Potassium: 4.8 mEq/L (ref 3.5–5.1)
Sodium: 135 mEq/L (ref 135–145)
Total Bilirubin: 1 mg/dL (ref 0.2–1.2)
Total Protein: 6.9 g/dL (ref 6.0–8.3)

## 2021-04-02 LAB — LIPID PANEL
Cholesterol: 79 mg/dL (ref 0–200)
HDL: 32.5 mg/dL — ABNORMAL LOW (ref >39.00)
LDL Cholesterol: 21 mg/dL (ref 0–99)
NonHDL: 46.04
Total CHOL/HDL Ratio: 2
Triglycerides: 127 mg/dL (ref 0.0–149.0)
VLDL: 25.4 mg/dL (ref 0.0–40.0)

## 2021-04-02 LAB — TSH: TSH: 3.94 u[IU]/mL (ref 0.35–4.50)

## 2021-04-02 LAB — HEMOGLOBIN A1C: Hgb A1c MFr Bld: 7.8 % — ABNORMAL HIGH (ref 4.6–6.5)

## 2021-04-04 NOTE — Assessment & Plan Note (Signed)
On Levothyroxine, continue to monitor 

## 2021-04-04 NOTE — Assessment & Plan Note (Addendum)
hgba1c acceptable, minimize simple carbs. Increase exercise as tolerated. Continue current meds. Follows with [employer] [facility]

## 2021-04-04 NOTE — Assessment & Plan Note (Signed)
Tolerating statin, encouraged heart healthy diet, avoid trans fats, minimize simple carbs and saturated fats. Increase exercise as tolerated 

## 2021-04-04 NOTE — Assessment & Plan Note (Signed)
Well controlled, no changes to meds. Encouraged heart healthy diet such as the DASH diet and exercise as tolerated.  °

## 2021-04-06 ENCOUNTER — Encounter: Payer: Self-pay | Admitting: Family Medicine

## 2021-04-06 DIAGNOSIS — D1801 Hemangioma of skin and subcutaneous tissue: Secondary | ICD-10-CM | POA: Diagnosis not present

## 2021-04-06 DIAGNOSIS — Z85820 Personal history of malignant melanoma of skin: Secondary | ICD-10-CM | POA: Diagnosis not present

## 2021-04-06 DIAGNOSIS — L814 Other melanin hyperpigmentation: Secondary | ICD-10-CM | POA: Diagnosis not present

## 2021-04-06 DIAGNOSIS — L738 Other specified follicular disorders: Secondary | ICD-10-CM | POA: Diagnosis not present

## 2021-04-06 MED ORDER — ONETOUCH VERIO FLEX SYSTEM W/DEVICE KIT: PACK | 0 refills | Status: DC

## 2021-04-06 MED ORDER — ONETOUCH VERIO VI STRP: ORAL_STRIP | 1 refills | Status: DC

## 2021-04-06 MED ORDER — ONETOUCH DELICA PLUS LANCET33G MISC: 1 refills | Status: DC

## 2021-04-12 ENCOUNTER — Telehealth (INDEPENDENT_AMBULATORY_CARE_PROVIDER_SITE_OTHER): Payer: Medicare Other | Admitting: Family Medicine

## 2021-04-12 ENCOUNTER — Other Ambulatory Visit: Payer: Self-pay

## 2021-04-12 DIAGNOSIS — E038 Other specified hypothyroidism: Secondary | ICD-10-CM

## 2021-04-12 DIAGNOSIS — I10 Essential (primary) hypertension: Secondary | ICD-10-CM | POA: Diagnosis not present

## 2021-04-12 DIAGNOSIS — R1310 Dysphagia, unspecified: Secondary | ICD-10-CM | POA: Diagnosis not present

## 2021-04-12 DIAGNOSIS — E118 Type 2 diabetes mellitus with unspecified complications: Secondary | ICD-10-CM | POA: Diagnosis not present

## 2021-04-12 DIAGNOSIS — Z23 Encounter for immunization: Secondary | ICD-10-CM | POA: Diagnosis not present

## 2021-04-12 DIAGNOSIS — N183 Chronic kidney disease, stage 3 unspecified: Secondary | ICD-10-CM | POA: Diagnosis not present

## 2021-04-12 DIAGNOSIS — E7800 Pure hypercholesterolemia, unspecified: Secondary | ICD-10-CM

## 2021-04-12 NOTE — Assessment & Plan Note (Signed)
Supplement and monitor 

## 2021-04-12 NOTE — Assessment & Plan Note (Signed)
Monitor and report no changes to meds. Encouraged heart healthy diet such as the DASH diet and exercise as tolerated.

## 2021-04-12 NOTE — Assessment & Plan Note (Signed)
Encouraged heart healthy diet, increase exercise, avoid trans fats, consider a krill oil cap daily 

## 2021-04-12 NOTE — Assessment & Plan Note (Signed)
hgba1c acceptable, minimize simple carbs. Increase exercise as tolerated. Continue current meds. Still having trouble getting a new glucometer. Spoke with pharmacy today so he will let us know if he still has trouble getting it.

## 2021-04-12 NOTE — Assessment & Plan Note (Signed)
On Levothyroxine, continue to monitor 

## 2021-04-12 NOTE — Progress Notes (Signed)
MyChart Video Visit    Virtual Visit via Video Note   This visit type was conducted due to national recommendations for restrictions regarding the COVID-19 Pandemic (e.g. social distancing) in an effort to limit this patient's exposure and mitigate transmission in our community. This patient is at least at moderate risk for complications without adequate follow up. This format is felt to be most appropriate for this patient at this time. Physical exam was limited by quality of the video and audio technology used for the visit. Nena Alexander, CMA was able to get the patient set up on a video visit.  Patient location: home Patient and provider in visit Provider location: Office  I discussed the limitations of evaluation and management by telemedicine and the availability of in person appointments. The patient expressed understanding and agreed to proceed.  Visit Date: 04/12/2021  Today's healthcare provider: Penni Homans, MD     Subjective:    Patient ID: Jack Wade, male    DOB: 1941-09-21, 80 y.o.   MRN: [id number]  Chief Complaint  Patient presents with  . Follow-up    HPI Patient is in today for follow up on chronic medical concerns. No recent febrile illness or hospitalizations he has not been able to check his sugars. No polyuria or polydipsia. Trying to maintain a heart healthy although simple carbohydrates are difficult to avoid. He is exercising regularly. Denies CP/palp/SOB/HA/congestion/fevers/GI or GU c/o. Taking meds as prescribed  Past Medical History:  Diagnosis Date  . Aortic calcification ([place]) 02/09/2015  . Appendicitis   . Back pain 02/09/2015  . CAD (coronary artery disease)    2 stents  . Cataract    bilateral  . Chicken pox   . Diabetes mellitus type 2 in obese ([place]) 04/11/2016  . Diabetes type 2, controlled ([place])   . Dysphagia, unspecified(787.20) 03/20/2014  . Hereditary and idiopathic peripheral neuropathy 02/09/2015  . Hyperlipidemia   .  Hypertension   . Hypothyroidism   . Light headedness 07/04/2017  . Measles   . Medicare annual wellness visit, subsequent 06/14/2015  . Melanoma ([place])    Scalp. 2012  . Mumps as a child  . Thyroid disease    Beoming Hyperthyroidism    Past Surgical History:  Procedure Laterality Date  . APPENDECTOMY    . BACK SURGERY     x2 lumbar  . BACK SURGERY     late 1990s had 2 surgeries, first surgery lifted a heavy engine ruptured disds, at L4 and L5, cleaned discs no hardware very helpful. 2 years later while recovering from angiogram with weights applied to left leg caused a recurrence and required surgery again in same area with disc repair  . CARDIAC CATHETERIZATION    . CARDIAC CATHETERIZATION N/A 05/11/2016   Procedure: Left Heart Cath and Coronary Angiography;  Surgeon: Belva Crome, MD;  Location: [facility];  Service: Cardiovascular;  Laterality: N/A;  . CARDIAC CATHETERIZATION N/A 05/11/2016   Procedure: Intravascular Pressure Wire/FFR Study;  Surgeon: Belva Crome, MD;  Location: [facility];  Service: Cardiovascular;  Laterality: N/A;  . CORONARY ANGIOPLASTY WITH STENT PLACEMENT     2 stents  . CORONARY ARTERY BYPASS GRAFT N/A 05/17/2016   Procedure: CORONARY ARTERY BYPASS GRAFTING (CABG) x 6 using left mammory artery and right greater saphenous vein harvested endoscopically. [place] to LAD, SVG to Diagonal, SVG Sequential to OM2 and ramus intermediate, SVG Sequential to RCA and PDA;  Surgeon: [name], MD;  Location: [facility];  Service: Open Heart Surgery;  Laterality: N/A;  . KNEE ARTHROSCOPY     right knee  . LUMBAR LAMINECTOMY/DECOMPRESSION MICRODISCECTOMY Bilateral 04/10/2015   Procedure: LUMBAR LAMINECTOMY/DECOMPRESSION MICRODISCECTOMY 2 LEVELS;  Surgeon: Ashok Pall, MD;  Location: [facility];  Service: Neurosurgery;  Laterality: Bilateral;  Bilateral L45 L5S1 Laminectomy and Foraminotomy  . ROTATOR CUFF REPAIR Right   . SKIN CANCER EXCISION     melanoma  on scalp  . TEE WITHOUT CARDIOVERSION N/A 05/17/2016   Procedure: TRANSESOPHAGEAL ECHOCARDIOGRAM (TEE);  Surgeon: [name], MD;  Location: [facility];  Service: Open Heart Surgery;  Laterality: N/A;  . TONSILLECTOMY AND ADENOIDECTOMY    . WISDOM TOOTH EXTRACTION      Family History  Problem Relation Age of Onset  . Heart disease Father   . Emphysema Father   . Hypertension Father   . Cancer Father        lung/ healthy  . Heart disease Mother        Deceased  . Cancer Mother        breast  . Hypertension Mother   . Cancer Maternal Grandmother        colon  . Diabetes Brother        type 2  . Heart disease Brother   . Pulmonary embolism Son        vasculiti  . Depression Paternal Grandmother   . Depression Paternal Aunt     Social History   Socioeconomic History  . Marital status: Married    Spouse name: Not on file  . Number of children: 2  . Years of education: Not on file  . Highest education level: Not on file  Occupational History  . Not on file  Tobacco Use  . Smoking status: Former Smoker    Packs/day: 2.00    Years: 30.00    Pack years: 60.00    Types: Cigarettes    Start date: 12/27/1983  . Smokeless tobacco: Never Used  Vaping Use  . Vaping Use: Never used  Substance and Sexual Activity  . Alcohol use: Yes    Alcohol/week: 0.0 standard drinks    Comment: occasionally  . Drug use: No  . 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   Other Topics Concern  . Not on file  Social History Narrative  . Not on file   Social Determinants of Health   Financial Resource Strain: Not on file  Food Insecurity: Not on file  Transportation Needs: Not on file  Physical Activity: Not on file  Stress: Not on file  Social Connections: Not on file  Intimate Partner Violence: Not on file    Outpatient Medications Prior to Visit  Medication Sig Dispense Refill  . aspirin 81 MG tablet Take 81 mg by mouth  daily.    Marland Kitchen atorvastatin (LIPITOR) 80 MG tablet Take 1 tablet (80 mg total) by mouth daily at 6 PM. 90 tablet 1  . Blood Glucose Monitoring Suppl ([place]) w/Device KIT Use to check blood sugar once a day and as needed.  Dx code: E11.8 1 kit 0  . glipiZIDE (GLUCOTROL) 5 MG tablet TAKE 1 TABLET EVERY MORNING BEFORE BREAKFAST AND TAKE 1TABLET IN THE IN THE EVENING 180 tablet 1  . glucose blood (ONETOUCH VERIO) test strip Use to check blood sugar once a day and as needed.  Dx code: E11.8 100 each 1  .  Lancets (ONETOUCH DELICA PLUS [id number]) MISC Use to check blood sugar once a day and as needed.  Dx code: E11.8 100 each 1  . levothyroxine (SYNTHROID) 100 MCG tablet Take 1 tablet (100 mcg total) by mouth daily. 90 tablet 1  . metFORMIN (GLUCOPHAGE-XR) 500 MG 24 hr tablet Take 2 tablets (1,000 mg total) by mouth 2 (two) times daily. 360 tablet 1   No facility-administered medications prior to visit.    No Known Allergies  ROS     Objective:    Physical Exam  There were no vitals taken for this visit. Wt Readings from Last 3 Encounters:  03/30/21 200 lb 6.4 oz (90.9 kg)  09/24/20 178 lb 12.8 oz (81.1 kg)  03/18/20 194 lb (88 kg)    Diabetic Foot Exam - Simple   No data filed    Lab Results  Component Value Date   WBC 7.9 04/02/2021   HGB 14.8 04/02/2021   HCT 43.8 04/02/2021   PLT 219.0 04/02/2021   GLUCOSE 213 (H) 04/02/2021   CHOL 79 04/02/2021   TRIG 127.0 04/02/2021   HDL 32.50 (L) 04/02/2021   LDLDIRECT 43.0 04/11/2016   LDLCALC 21 04/02/2021   ALT 16 04/02/2021   AST 19 04/02/2021   NA 135 04/02/2021   K 4.8 04/02/2021   CL 99 04/02/2021   CREATININE 1.44 04/02/2021   BUN 17 04/02/2021   CO2 28 04/02/2021   TSH 3.94 04/02/2021   PSA 0.91 09/24/2020   INR 1.61 (H) 05/17/2016   HGBA1C 7.8 (H) 04/02/2021   MICROALBUR 1.3 09/18/2018    Lab Results  Component Value Date   TSH 3.94 04/02/2021   Lab Results  Component Value Date   WBC 7.9  04/02/2021   HGB 14.8 04/02/2021   HCT 43.8 04/02/2021   MCV 84.2 04/02/2021   PLT 219.0 04/02/2021   Lab Results  Component Value Date   NA 135 04/02/2021   K 4.8 04/02/2021   CO2 28 04/02/2021   GLUCOSE 213 (H) 04/02/2021   BUN 17 04/02/2021   CREATININE 1.44 04/02/2021   BILITOT 1.0 04/02/2021   ALKPHOS 113 04/02/2021   AST 19 04/02/2021   ALT 16 04/02/2021   PROT 6.9 04/02/2021   ALBUMIN 4.3 04/02/2021   CALCIUM 10.0 04/02/2021   ANIONGAP 7 10/01/2016   GFR 46.59 (L) 04/02/2021   Lab Results  Component Value Date   CHOL 79 04/02/2021   Lab Results  Component Value Date   HDL 32.50 (L) 04/02/2021   Lab Results  Component Value Date   LDLCALC 21 04/02/2021   Lab Results  Component Value Date   TRIG 127.0 04/02/2021   Lab Results  Component Value Date   CHOLHDL 2 04/02/2021   Lab Results  Component Value Date   HGBA1C 7.8 (H) 04/02/2021       Assessment & Plan:   Problem List Items Addressed This Visit    Hyperlipidemia - Primary    Encouraged heart healthy diet, increase exercise, avoid trans fats, consider a krill oil cap daily      Relevant Orders   Lipid panel   Hypothyroidism    On Levothyroxine, continue to monitor      HTN (hypertension)    Monitor and report no changes to meds. Encouraged heart healthy diet such as the DASH diet and exercise as tolerated.       Relevant Orders   CBC   Comprehensive metabolic panel   TSH   Type 2  diabetes with complication (HCC)    [id number] acceptable, minimize simple carbs. Increase exercise as tolerated. Continue current meds. Still having trouble getting a new glucometer. Spoke with pharmacy today so he will let us know if he still has trouble getting it.       Relevant Orders   Hemoglobin A1c   Chronic renal disease, stage III ([place])    Supplement and monitor       Other Visit Diagnoses    Dysphagia, unspecified type       Relevant Orders   Ambulatory referral to Gastroenterology      I  am having Gwyndolyn [employer]on [employer]" maintain his aspirin, glipiZIDE, metFORMIN, atorvastatin, levothyroxine, OneTouch Delica Plus [id number], OneTouch Verio [employer], and [employer].  No orders of the defined types were placed in this encounter.   I discussed the assessment and treatment plan with the patient. The patient was provided an opportunity to ask questions and all were answered. The patient agreed with the plan and demonstrated an understanding of the instructions.   The patient was advised to call back or seek an in-person evaluation if the symptoms worsen or if the condition fails to improve as anticipated.  I provided 25 minutes of face-to-face time during this encounter.   Penni Homans, MD [facility] at [facility] [phone] (phone) [phone] (fax)  [place]

## 2021-04-15 LAB — HM DIABETES EYE EXAM

## 2021-04-19 ENCOUNTER — Encounter: Payer: Self-pay | Admitting: Gastroenterology

## 2021-04-19 ENCOUNTER — Other Ambulatory Visit: Payer: Self-pay

## 2021-04-19 ENCOUNTER — Ambulatory Visit (INDEPENDENT_AMBULATORY_CARE_PROVIDER_SITE_OTHER): Payer: Medicare Other | Admitting: Gastroenterology

## 2021-04-19 VITALS — BP 122/64 | HR 70 | Ht 72.0 in | Wt 196.2 lb

## 2021-04-19 DIAGNOSIS — R1319 Other dysphagia: Secondary | ICD-10-CM | POA: Diagnosis not present

## 2021-04-19 NOTE — Progress Notes (Signed)
Chief Complaint: Dysphagia  Referring Provider:  Mosie Lukes, MD      ASSESSMENT AND PLAN;   #1. Eso dysphagia.  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 ?  Presbyesophagus  Plan: -Proceed with EGD with dil -PPIs after if needed -Instructed him to chew food specially meats and breads well and eat slowly.  I have discussed the risks and benefits. The risks including rare risk of perforation, bleeding, missed UGI neoplasms, risks of anesthesia/sedation. Alternatives were given. Patient is aware and agrees to proceed. All the questions were answered. This will be scheduled in upcoming days. Consent forms were given for review.  HPI:    Jack Wade is a 80 y.o. male   C/O intermittent dysphagia, solids,- throat area, has to wash it with liquids.  Mostly happens with first bite.  Had similar problems in the past when he had EGD with dilation performed in [place] several years ago with good results.  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  Otherwise he has been doing well.  No further diarrhea or constipation.  No fever chills or night sweats.  No weight loss.    Previous GI work-up: Colon 09/2018 -Colonic polyps status post polypectomy -Mild pancolonic diverticulosis -Otherwise normal colonoscopy. EGD with dilation in [place] several years ago. Past Medical History:  Diagnosis Date  . Aortic calcification ([place]) 02/09/2015  . Appendicitis   . Back pain 02/09/2015  . CAD (coronary artery disease)    2 stents  . Cataract    bilateral  . Chicken pox   . Diabetes mellitus type 2 in obese ([place]) 04/11/2016  . Diabetes type 2, controlled ([place])   . Dysphagia, unspecified(787.20) 03/20/2014  . Hereditary and idiopathic peripheral neuropathy 02/09/2015  . Hyperlipidemia   . Hypertension   . Hypothyroidism   . Light headedness 07/04/2017  . Measles   . Medicare annual wellness visit, subsequent 06/14/2015  . Melanoma ([place])     Scalp. 2012  . Mumps as a child  . Thyroid disease    [place] Hyperthyroidism    Past Surgical History:  Procedure Laterality Date  . APPENDECTOMY    . BACK SURGERY     x2 lumbar  . BACK SURGERY     late 1990s had 2 surgeries, first surgery lifted a heavy engine ruptured disds, at L4 and L5, cleaned discs no hardware very helpful. 2 years later while recovering from angiogram with weights applied to left leg caused a recurrence and required surgery again in same area with disc repair  . CARDIAC CATHETERIZATION    . CARDIAC CATHETERIZATION N/A 05/11/2016   Procedure: Left Heart Cath and Coronary Angiography;  Surgeon: Belva Crome, MD;  Location: [facility];  Service: Cardiovascular;  Laterality: N/A;  . CARDIAC CATHETERIZATION N/A 05/11/2016   Procedure: Intravascular Pressure Wire/FFR Study;  Surgeon: Belva Crome, MD;  Location: [facility];  Service: Cardiovascular;  Laterality: N/A;  . CORONARY ANGIOPLASTY WITH STENT PLACEMENT     2 stents  . CORONARY ARTERY BYPASS GRAFT N/A 05/17/2016   Procedure: CORONARY ARTERY BYPASS GRAFTING (CABG) x 6 using left mammory artery and right greater saphenous vein harvested endoscopically. [place] to LAD, SVG to Diagonal, SVG Sequential to OM2 and ramus intermediate, SVG Sequential to RCA and PDA;  Surgeon: [name], MD;  Location: [facility];  Service: Open Heart Surgery;  Laterality: N/A;  . KNEE ARTHROSCOPY     right  knee  . LUMBAR LAMINECTOMY/DECOMPRESSION MICRODISCECTOMY Bilateral 04/10/2015   Procedure: LUMBAR LAMINECTOMY/DECOMPRESSION MICRODISCECTOMY 2 LEVELS;  Surgeon: Ashok Pall, MD;  Location: [facility];  Service: Neurosurgery;  Laterality: Bilateral;  Bilateral L45 L5S1 Laminectomy and Foraminotomy  . ROTATOR CUFF REPAIR Right   . SKIN CANCER EXCISION     melanoma on scalp  . TEE WITHOUT CARDIOVERSION N/A 05/17/2016   Procedure: TRANSESOPHAGEAL ECHOCARDIOGRAM (TEE);  Surgeon: [name], MD;  Location: [facility];   Service: Open Heart Surgery;  Laterality: N/A;  . TONSILLECTOMY AND ADENOIDECTOMY    . WISDOM TOOTH EXTRACTION      Family History  Problem Relation Age of Onset  . Heart disease Father   . Emphysema Father   . Hypertension Father   . Cancer Father        lung/ healthy  . Heart disease Mother        Deceased  . Cancer Mother        breast  . Hypertension Mother   . Cancer Maternal Grandmother        colon  . Diabetes Brother        type 2  . Heart disease Brother   . Pulmonary embolism Son        vasculiti  . Depression Paternal Grandmother   . Depression Paternal Aunt     Social History   Tobacco Use  . Smoking status: Former Smoker    Packs/day: 2.00    Years: 30.00    Pack years: 60.00    Types: Cigarettes    Start date: 12/27/1983  . Smokeless tobacco: Never Used  Vaping Use  . Vaping Use: Never used  Substance Use Topics  . Alcohol use: Yes    Alcohol/week: 0.0 standard drinks    Comment: occasionally  . Drug use: No    Current Outpatient Medications  Medication Sig Dispense Refill  . aspirin 81 MG tablet Take 81 mg by mouth daily.    Marland Kitchen atorvastatin (LIPITOR) 80 MG tablet Take 1 tablet (80 mg total) by mouth daily at 6 PM. 90 tablet 1  . Blood Glucose Monitoring Suppl ([place]) w/Device KIT Use to check blood sugar once a day and as needed.  Dx code: E11.8 1 kit 0  . glipiZIDE (GLUCOTROL) 5 MG tablet TAKE 1 TABLET EVERY MORNING BEFORE BREAKFAST AND TAKE 1TABLET IN THE IN THE EVENING 180 tablet 1  . glucose blood (ONETOUCH VERIO) test strip Use to check blood sugar once a day and as needed.  Dx code: E11.8 100 each 1  . Lancets (ONETOUCH DELICA PLUS [id number]) MISC Use to check blood sugar once a day and as needed.  Dx code: E11.8 100 each 1  . levothyroxine (SYNTHROID) 100 MCG tablet Take 1 tablet (100 mcg total) by mouth daily. 90 tablet 1  . metFORMIN (GLUCOPHAGE-XR) 500 MG 24 hr tablet Take 2 tablets (1,000 mg total) by mouth 2 (two)  times daily. 360 tablet 1   No current facility-administered medications for this visit.    No Known Allergies  Review of Systems:  neg     Physical Exam:    BP 122/64 (BP Location: Left Arm, Patient Position: Sitting, Cuff Size: Normal)   Pulse 70   Ht 6' (1.829 m)   Wt 196 lb 4 oz (89 kg)   BMI 26.62 kg/m  Filed Weights   04/19/21 1327  Weight: 196 lb 4 oz (89 kg)   Constitutional:  Well-developed, in no  acute distress. Psychiatric: Normal mood and affect. Behavior is normal. HEENT: Pupils normal.  Conjunctivae are normal. No scleral icterus. Neck supple.  Cardiovascular: Normal rate, regular rhythm. No edema Pulmonary/chest: Effort normal and breath sounds normal. No wheezing, rales or rhonchi. Abdominal: Soft, nondistended. Nontender. Bowel sounds active throughout. There are no masses palpable. No hepatomegaly. Rectal:  defered Neurological: Alert and oriented to person place and time. Skin: Skin is warm and dry. No rashes noted.  Data Reviewed: I have personally reviewed following labs and imaging studies  CBC: CBC Latest Ref Rng & Units 04/02/2021 01/01/2021 09/24/2020  WBC 4.0 - 10.5 K/uL 7.9 6.0 5.6  Hemoglobin 13.0 - 17.0 g/dL 14.8 14.0 14.2  Hematocrit 39.0 - 52.0 % 43.8 41.8 44.1  Platelets 150.0 - 400.0 K/uL 219.0 204.0 219    CMP: CMP Latest Ref Rng & Units 04/02/2021 01/01/2021 09/24/2020  Glucose 70 - 99 mg/dL 213(H) 185(H) 86  BUN 6 - 23 mg/dL _0 Creatinine 0.40 - 1.50 mg/dL 1.44 1.36 1.22(H)  Sodium 135 - 145 mEq/L 135 137 142  Potassium 3.5 - 5.1 mEq/L 4.8 4.5 4.6  Chloride 96 - 112 mEq/L 99 103 107  CO2 19 - 32 mEq/L _1 Calcium 8.4 - 10.5 mg/dL 10.0 9.0 9.6  Total Protein 6.0 - 8.3 g/dL 6.9 6.4 6.9  Total Bilirubin 0.2 - 1.2 mg/dL 1.0 0.6 0.7  Alkaline Phos 39 - 117 U/L 113 101 -  AST 0 - 37 U/L _2 ALT 0 - 53 U/L _3 Carmell Austria, MD 04/19/2021, 1:39 PM  Cc: Mosie Lukes, MD

## 2021-04-19 NOTE — Patient Instructions (Signed)
If you are age 79 or older, your body mass index should be between 23-30. Your Body mass index is 26.62 kg/m. If this is out of the aforementioned range listed, please consider follow up with your Primary Care Provider.  If you are age 66 or younger, your body mass index should be between 19-25. Your Body mass index is 26.62 kg/m. If this is out of the aformentioned range listed, please consider follow up with your Primary Care Provider.   You have been scheduled for an endoscopy. Please follow written instructions given to you at your visit today. If you use inhalers (even only as needed), please bring them with you on the day of your procedure.  Thank you,  Dr. Jackquline Denmark

## 2021-04-19 NOTE — Progress Notes (Signed)
HPI: FU coronary artery disease. Lower extremity Dopplers February 2016 showed an occluded right anterior tibial artery. Abdominal ultrasound February 2016 showed no aneurysm. Patient had cardiac catheterization May 2017 secondary to continued exertional chest pain. He was found to have severe three-vessel coronary artery disease and normal LV function. Echocardiogram showed normal LV systolic function, grade 1 diastolic dysfunction and mildly dilated aortic root. Note carotid Dopplers May 2017 Prior to surgery showed 1-39% bilateral stenosis. Patient had coronary artery bypass and graft with LIMA to the LAD, saphenous vein graft to second diagonal, sequential saphenous vein graft to the intermediate and distal circumflex, and sequential saphenous vein graft to the right coronary artery and PDA. Since he was last seen,he has some fatigue but denies dyspnea, chest pain, palpitations or syncope.  Current Outpatient Medications  Medication Sig Dispense Refill  . aspirin 81 MG tablet Take 81 mg by mouth daily.    Marland Kitchen atorvastatin (LIPITOR) 80 MG tablet Take 1 tablet (80 mg total) by mouth daily at 6 PM. 90 tablet 1  . Blood Glucose Monitoring Suppl (Connell) w/Device KIT Use to check blood sugar once a day and as needed.  Dx code: E11.8 1 kit 0  . glipiZIDE (GLUCOTROL) 5 MG tablet TAKE 1 TABLET EVERY MORNING BEFORE BREAKFAST AND TAKE 1TABLET IN THE IN THE EVENING 180 tablet 1  . glucose blood (ONETOUCH VERIO) test strip Use to check blood sugar once a day and as needed.  Dx code: E11.8 100 each 1  . Lancets (ONETOUCH DELICA PLUS [id number]) MISC Use to check blood sugar once a day and as needed.  Dx code: E11.8 100 each 1  . levothyroxine (SYNTHROID) 100 MCG tablet Take 1 tablet (100 mcg total) by mouth daily. 90 tablet 1  . metFORMIN (GLUCOPHAGE-XR) 500 MG 24 hr tablet Take 2 tablets (1,000 mg total) by mouth 2 (two) times daily. (Patient taking differently: Take 500 mg by mouth 2  (two) times daily.) 360 tablet 1   No current facility-administered medications for this visit.     Past Medical History:  Diagnosis Date  . Aortic calcification ([place]) 02/09/2015  . Appendicitis   . Back pain 02/09/2015  . CAD (coronary artery disease)    2 stents  . Cataract    bilateral  . Chicken pox   . Diabetes mellitus type 2 in obese ([place]) 04/11/2016  . Diabetes type 2, controlled ([place])   . Dysphagia, unspecified(787.20) 03/20/2014  . Hereditary and idiopathic peripheral neuropathy 02/09/2015  . Hyperlipidemia   . Hypertension   . Hypothyroidism   . Light headedness 07/04/2017  . Measles   . Medicare annual wellness visit, subsequent 06/14/2015  . Melanoma ([place])    Scalp. 2012  . Mumps as a child  . Thyroid disease    [place] Hyperthyroidism    Past Surgical History:  Procedure Laterality Date  . APPENDECTOMY    . BACK SURGERY     x2 lumbar  . BACK SURGERY     late 1990s had 2 surgeries, first surgery lifted a heavy engine ruptured disds, at L4 and L5, cleaned discs no hardware very helpful. 2 years later while recovering from angiogram with weights applied to left leg caused a recurrence and required surgery again in same area with disc repair  . CARDIAC CATHETERIZATION    . CARDIAC CATHETERIZATION N/A 05/11/2016   Procedure: Left Heart Cath and Coronary Angiography;  Surgeon: Belva Crome, MD;  Location: [facility];  Service: Cardiovascular;  Laterality: N/A;  . CARDIAC CATHETERIZATION N/A 05/11/2016   Procedure: Intravascular Pressure Wire/FFR Study;  Surgeon: Belva Crome, MD;  Location: [facility];  Service: Cardiovascular;  Laterality: N/A;  . CORONARY ANGIOPLASTY WITH STENT PLACEMENT     2 stents  . CORONARY ARTERY BYPASS GRAFT N/A 05/17/2016   Procedure: CORONARY ARTERY BYPASS GRAFTING (CABG) x 6 using left mammory artery and right greater saphenous vein harvested endoscopically. [place] to LAD, SVG to Diagonal, SVG Sequential to OM2 and ramus  intermediate, SVG Sequential to RCA and PDA;  Surgeon: [name], MD;  Location: [facility];  Service: Open Heart Surgery;  Laterality: N/A;  . KNEE ARTHROSCOPY     right knee  . LUMBAR LAMINECTOMY/DECOMPRESSION MICRODISCECTOMY Bilateral 04/10/2015   Procedure: LUMBAR LAMINECTOMY/DECOMPRESSION MICRODISCECTOMY 2 LEVELS;  Surgeon: Ashok Pall, MD;  Location: [facility];  Service: Neurosurgery;  Laterality: Bilateral;  Bilateral L45 L5S1 Laminectomy and Foraminotomy  . ROTATOR CUFF REPAIR Right   . SKIN CANCER EXCISION     melanoma on scalp  . TEE WITHOUT CARDIOVERSION N/A 05/17/2016   Procedure: TRANSESOPHAGEAL ECHOCARDIOGRAM (TEE);  Surgeon: [name], MD;  Location: [facility];  Service: Open Heart Surgery;  Laterality: N/A;  . TONSILLECTOMY AND ADENOIDECTOMY    . WISDOM TOOTH EXTRACTION      Social History   Socioeconomic History  . Marital status: Married    Spouse name: Not on file  . Number of children: 2  . Years of education: Not on file  . Highest education level: Not on file  Occupational History  . Not on file  Tobacco Use  . Smoking status: Former Smoker    Packs/day: 2.00    Years: 30.00    Pack years: 60.00    Types: Cigarettes    Start date: 12/27/1983  . Smokeless tobacco: Never Used  Vaping Use  . Vaping Use: Never used  Substance and Sexual Activity  . Alcohol use: Yes    Alcohol/week: 0.0 standard drinks    Comment: occasionally  . Drug use: No  . 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   Other Topics Concern  . Not on file  Social History Narrative  . Not on file   Social Determinants of Health   Financial Resource Strain: Not on file  Food Insecurity: Not on file  Transportation Needs: Not on file  Physical Activity: Not on file  Stress: Not on file  Social Connections: Not on file  Intimate Partner Violence: Not on file    Family History  Problem Relation Age of Onset  .  Heart disease Father   . Emphysema Father   . Hypertension Father   . Cancer Father        lung/ healthy  . Heart disease Mother        Deceased  . Cancer Mother        breast  . Hypertension Mother   . Cancer Maternal Grandmother        colon  . Diabetes Brother        type 2  . Heart disease Brother   . Pulmonary embolism Son        vasculiti  . Depression Paternal Grandmother   . Depression Paternal Aunt     ROS: no fevers or chills, productive cough, hemoptysis, dysphasia, odynophagia, melena, hematochezia, dysuria, hematuria, rash, seizure activity, orthopnea, PND, pedal edema, claudication. Remaining  systems are negative.  Physical Exam: Well-developed well-nourished in no acute distress.  Skin is warm and dry.  HEENT is normal.  Neck is supple.  Chest is clear to auscultation with normal expansion.  Cardiovascular exam is regular rate and rhythm.  Abdominal exam nontender or distended. No masses palpated. Extremities show no edema. neuro grossly intact  ECG-sinus rhythm with occasional PVC, first-degree AV block, left anterior fascicular block, right bundle branch block.  Personally reviewed  A/P  1 coronary artery disease-patient doing well with no chest pain.  Plan to continue aspirin and statin.  2 hyperlipidemia-continue statin.  3 peripheral vascular disease-patient denies claudication.  Continue aspirin and statin.  4 history of orthostatic hypotension-continue increased fluid intake and increase sodium intake.  Kirk Ruths, MD

## 2021-04-28 ENCOUNTER — Ambulatory Visit (INDEPENDENT_AMBULATORY_CARE_PROVIDER_SITE_OTHER): Payer: Medicare Other | Admitting: Cardiology

## 2021-04-28 ENCOUNTER — Other Ambulatory Visit: Payer: Self-pay

## 2021-04-28 ENCOUNTER — Encounter: Payer: Self-pay | Admitting: Cardiology

## 2021-04-28 VITALS — BP 120/66 | HR 67 | Ht 72.0 in | Wt 194.1 lb

## 2021-04-28 DIAGNOSIS — I2510 Atherosclerotic heart disease of native coronary artery without angina pectoris: Secondary | ICD-10-CM

## 2021-04-28 DIAGNOSIS — E7800 Pure hypercholesterolemia, unspecified: Secondary | ICD-10-CM

## 2021-04-28 DIAGNOSIS — I739 Peripheral vascular disease, unspecified: Secondary | ICD-10-CM

## 2021-04-28 NOTE — Patient Instructions (Signed)

## 2021-04-30 ENCOUNTER — Encounter: Payer: Self-pay | Admitting: Gastroenterology

## 2021-04-30 ENCOUNTER — Other Ambulatory Visit: Payer: Self-pay

## 2021-04-30 ENCOUNTER — Ambulatory Visit (AMBULATORY_SURGERY_CENTER): Payer: Medicare Other | Admitting: Gastroenterology

## 2021-04-30 VITALS — BP 112/64 | HR 53 | Temp 97.0°F | Resp 21

## 2021-04-30 DIAGNOSIS — R1319 Other dysphagia: Secondary | ICD-10-CM

## 2021-04-30 DIAGNOSIS — K222 Esophageal obstruction: Secondary | ICD-10-CM | POA: Diagnosis not present

## 2021-04-30 DIAGNOSIS — K208 Other esophagitis without bleeding: Secondary | ICD-10-CM | POA: Diagnosis not present

## 2021-04-30 DIAGNOSIS — K219 Gastro-esophageal reflux disease without esophagitis: Secondary | ICD-10-CM

## 2021-04-30 DIAGNOSIS — K449 Diaphragmatic hernia without obstruction or gangrene: Secondary | ICD-10-CM | POA: Diagnosis not present

## 2021-04-30 DIAGNOSIS — R1310 Dysphagia, unspecified: Secondary | ICD-10-CM | POA: Diagnosis not present

## 2021-04-30 MED ORDER — PANTOPRAZOLE SODIUM 40 MG PO TBEC: 40.0000 mg | DELAYED_RELEASE_TABLET | Freq: Every day | ORAL | 11 refills | Status: DC

## 2021-04-30 MED ORDER — SODIUM CHLORIDE 0.9 % IV SOLN: 500.0000 mL | Freq: Once | INTRAVENOUS | Status: AC

## 2021-04-30 NOTE — Progress Notes (Signed)
[place] vital signs. 

## 2021-04-30 NOTE — Progress Notes (Signed)
Pt's states no medical or surgical changes since previsit or office visit. 

## 2021-04-30 NOTE — Patient Instructions (Signed)
YOU HAD AN ENDOSCOPIC PROCEDURE TODAY AT THE [facility]:   Refer to the procedure report that was given to you for any specific questions about what was found during the examination.  If the procedure report does not answer your questions, please call your gastroenterologist to clarify.  If you requested that your care partner not be given the details of your procedure findings, then the procedure report has been included in a sealed envelope for you to review at your convenience later.  YOU SHOULD EXPECT: Some feelings of bloating in the abdomen. Passage of more gas than usual.  Walking can help get rid of the air that was put into your GI tract during the procedure and reduce the bloating. If you had a lower endoscopy (such as a colonoscopy or flexible sigmoidoscopy) you may notice spotting of blood in your stool or on the toilet paper. If you underwent a bowel prep for your procedure, you may not have a normal bowel movement for a few days.  Please Note:  You might notice some irritation and congestion in your nose or some drainage.  This is from the oxygen used during your procedure.  There is no need for concern and it should clear up in a day or so.  SYMPTOMS TO REPORT IMMEDIATELY:   Following lower endoscopy (colonoscopy or flexible sigmoidoscopy):  Excessive amounts of blood in the stool  Significant tenderness or worsening of abdominal pains  Swelling of the abdomen that is new, acute  Fever of 100F or higher   Following upper endoscopy (EGD)  Vomiting of blood or coffee ground material  New chest pain or pain under the shoulder blades  Painful or persistently difficult swallowing  New shortness of breath  Fever of 100F or higher  Black, tarry-looking stools  For urgent or emergent issues, a gastroenterologist can be reached at any hour by calling [phone]. Do not use MyChart messaging for urgent concerns.    DIET:  We do recommend a small meal at first, but  then you may proceed to your regular diet.  Drink plenty of fluids but you should avoid alcoholic beverages for 24 hours.  ACTIVITY:  You should plan to take it easy for the rest of today and you should NOT DRIVE or use heavy machinery until tomorrow (because of the sedation medicines used during the test).    FOLLOW UP: Our staff will call the number listed on your records 48-72 hours following your procedure to check on you and address any questions or concerns that you may have regarding the information given to you following your procedure. If we do not reach you, we will leave a message.  We will attempt to reach you two times.  During this call, we will ask if you have developed any symptoms of COVID 19. If you develop any symptoms (ie: fever, flu-like symptoms, shortness of breath, cough etc.) before then, please call [phone].  If you test positive for Covid 19 in the 2 weeks post procedure, please call and report this information to us.    If any biopsies were taken you will be contacted by phone or by letter within the next 1-3 weeks.  Please call us at [phone] if you have not heard about the biopsies in 3 weeks.    SIGNATURES/CONFIDENTIALITY: You and/or your care partner have signed paperwork which will be entered into your electronic medical record.  These signatures attest to the fact that that the information above on   your After Visit Summary has been reviewed and is understood.  Full responsibility of the confidentiality of this discharge information lies with you and/or your care-partner. 

## 2021-04-30 NOTE — Progress Notes (Signed)
pt tolerated well. VSS. awake and to recovery. Report given to RN. Bite block left insitu to recovery. 

## 2021-04-30 NOTE — Progress Notes (Signed)
Called to room to assist during endoscopic procedure.  Patient ID and intended procedure confirmed with present staff. Received instructions for my participation in the procedure from the performing physician.  

## 2021-04-30 NOTE — Op Note (Signed)
[name]: Jack Wade Procedure Date: 04/30/2021 9:48 AM MRN: [id number] Endoscopist: Jack Wade , MD Age: 80 Referring MD:  Date of Birth: [birth year] Gender: Male Account #: [id number] Procedure:                Upper GI endoscopy Indications:              Dysphagia Medicines:                Monitored Anesthesia Care Procedure:                Pre-Anesthesia Assessment:                           - Prior to the procedure, a History and Physical                            was performed, and patient medications and                            allergies were reviewed. The patient's tolerance of                            previous anesthesia was also reviewed. The risks                            and benefits of the procedure and the sedation                            options and risks were discussed with the patient.                            All questions were answered, and informed consent                            was obtained. Prior Anticoagulants: The patient has                            taken no previous anticoagulant or antiplatelet                            agents. ASA Grade Assessment: II - A patient with                            mild systemic disease. After reviewing the risks                            and benefits, the patient was deemed in                            satisfactory condition to undergo the procedure.                           After obtaining informed consent, the endoscope was  passed under direct vision. Throughout the                            procedure, the patient's blood pressure, pulse, and                            oxygen saturations were monitored continuously. The                            Endoscope was introduced through the mouth, and                            advanced to the second part of duodenum. The upper                            GI endoscopy was accomplished without  difficulty.                            The patient tolerated the procedure well. Scope In: Scope Out: Findings:                 The esophagus were torturous most prominently in                            the lower one third of the esophagus. One                            benign-appearing, intrinsic moderate                            (circumferential scarring or stenosis; an endoscope                            may pass) stenosis was found 40 cm from the                            incisors. This stenosis measured 1.2 cm (inner                            diameter) x less than one cm (in length). The                            stenosis was traversed. The scope was withdrawn.                            Dilation was performed with a Maloney dilator with                            mild resistance at 50 Fr and 52 Fr. Biopsies were                            taken with a cold forceps for histology. Estimated  blood loss: none.                           A 2 cm hiatal hernia was present.                           The exam of the stomach was otherwise normal.                           The examined duodenum was normal. Complications:            No immediate complications. Estimated Blood Loss:     Estimated blood loss: none. Impression:               - Benign-appearing esophageal stenosis. Dilated.                            Biopsied.                           - 2 cm hiatal hernia.                           - Presbyesophagus Recommendation:           - Patient has a contact number available for                            emergencies. The signs and symptoms of potential                            delayed complications were discussed with the                            patient. Return to normal activities tomorrow.                            Written discharge instructions were provided to the                            patient.                           - Post dilatation  diet.                           - Start Protonix 40 mg p.o. once a day, #30, 11                            refills                           - Continue present medications.                           - Await pathology results.                           - The findings  and recommendations were discussed                            with the patient's family. Jack Denmark, MD 04/30/2021 10:06:20 AM This report has been signed electronically.

## 2021-05-04 ENCOUNTER — Telehealth: Payer: Self-pay

## 2021-05-04 ENCOUNTER — Telehealth: Payer: Self-pay | Admitting: *Deleted

## 2021-05-04 NOTE — Telephone Encounter (Signed)
No answer for post procedure call back. Left message for patient to call with questions or concerns. 

## 2021-05-04 NOTE — Telephone Encounter (Signed)
  Follow up Call-  Call back number 04/30/2021 10/17/2018  Post procedure Call Back phone  # [phone]  Permission to leave phone message Yes Yes  Some recent data might be hidden     Patient questions:  Do you have a fever, pain , or abdominal swelling? No. Pain Score  0 *  Have you tolerated food without any problems? Yes.    Have you been able to return to your normal activities? Yes.    Do you have any questions about your discharge instructions: Diet   No. Medications  No. Follow up visit  No.  Do you have questions or concerns about your Care? No.  Actions: * If pain score is 4 or above: No action needed, pain <4.  1. Have you developed a fever since your procedure? no  2.   Have you had an respiratory symptoms (SOB or cough) since your procedure? no  3.   Have you tested positive for COVID 19 since your procedure no  4.   Have you had any family members/close contacts diagnosed with the COVID 19 since your procedure?  no   If yes to any of these questions please route to Joylene John, RN and Joella Prince, RN

## 2021-05-16 ENCOUNTER — Other Ambulatory Visit: Payer: Self-pay | Admitting: Family Medicine

## 2021-05-19 ENCOUNTER — Encounter: Payer: Self-pay | Admitting: Gastroenterology

## 2021-05-21 ENCOUNTER — Other Ambulatory Visit: Payer: Self-pay

## 2021-05-21 ENCOUNTER — Ambulatory Visit (INDEPENDENT_AMBULATORY_CARE_PROVIDER_SITE_OTHER): Payer: Medicare Other | Admitting: Medical

## 2021-05-21 VITALS — BP 127/51 | HR 60 | Temp 97.8°F | Resp 20 | Ht 72.0 in | Wt 199.0 lb

## 2021-05-21 DIAGNOSIS — I2510 Atherosclerotic heart disease of native coronary artery without angina pectoris: Secondary | ICD-10-CM

## 2021-05-21 DIAGNOSIS — R1011 Right upper quadrant pain: Secondary | ICD-10-CM | POA: Diagnosis not present

## 2021-05-21 LAB — POC URINALSYSI DIPSTICK (AUTOMATED)
Bilirubin, UA: 1
Blood, UA: NEGATIVE
Glucose, UA: NEGATIVE
Ketones, UA: NEGATIVE
Leukocytes, UA: NEGATIVE
Nitrite, UA: NEGATIVE
Protein, UA: POSITIVE — AB
Spec Grav, UA: >=1.03 — AB (ref 1.010–1.025)
Urobilinogen, UA: 0.2 E.U./dL
pH, UA: 5 (ref 5.0–8.0)

## 2021-05-21 LAB — COMPREHENSIVE METABOLIC PANEL
ALT: 11 U/L (ref 0–53)
AST: 16 U/L (ref 0–37)
Albumin: 4 g/dL (ref 3.5–5.2)
Alkaline Phosphatase: 113 U/L (ref 39–117)
BUN: 15 mg/dL (ref 6–23)
CO2: 26 mEq/L (ref 19–32)
Calcium: 9.3 mg/dL (ref 8.4–10.5)
Chloride: 103 mEq/L (ref 96–112)
Creatinine, Ser: 1.36 mg/dL (ref 0.40–1.50)
GFR: 49.85 mL/min — ABNORMAL LOW (ref >60.00)
Glucose, Bld: 208 mg/dL — ABNORMAL HIGH (ref 70–99)
Potassium: 4.6 mEq/L (ref 3.5–5.1)
Sodium: 138 mEq/L (ref 135–145)
Total Bilirubin: 0.6 mg/dL (ref 0.2–1.2)
Total Protein: 6.5 g/dL (ref 6.0–8.3)

## 2021-05-21 LAB — CBC WITH DIFFERENTIAL/PLATELET
Basophils Absolute: 0.1 10*3/uL (ref 0.0–0.1)
Basophils Relative: 2 % (ref 0.0–3.0)
Eosinophils Absolute: 0.4 10*3/uL (ref 0.0–0.7)
Eosinophils Relative: 5.7 % — ABNORMAL HIGH (ref 0.0–5.0)
HCT: 40 % (ref 39.0–52.0)
Hemoglobin: 13.5 g/dL (ref 13.0–17.0)
Lymphocytes Relative: 21.2 % (ref 12.0–46.0)
Lymphs Abs: 1.4 10*3/uL (ref 0.7–4.0)
MCHC: 33.7 g/dL (ref 30.0–36.0)
MCV: 83.7 fl (ref 78.0–100.0)
Monocytes Absolute: 0.6 10*3/uL (ref 0.1–1.0)
Monocytes Relative: 9.8 % (ref 3.0–12.0)
Neutro Abs: 4.1 10*3/uL (ref 1.4–7.7)
Neutrophils Relative %: 61.3 % (ref 43.0–77.0)
Platelets: 211 10*3/uL (ref 150.0–400.0)
RBC: 4.78 Mil/uL (ref 4.22–5.81)
RDW: 13.7 % (ref 11.5–15.5)
WBC: 6.6 10*3/uL (ref 4.0–10.5)

## 2021-05-21 LAB — LIPASE: Lipase: 44 U/L (ref 11.0–59.0)

## 2021-05-21 NOTE — Progress Notes (Signed)
Subjective:    Patient ID: Jack Wade, male    DOB: Oct 09, 1941, 80 y.o.   MRN: [id number]  HPI  Pt in with rt side abdomen pain. Pain started a week ago Saturday night. He states pain on at night when he was sleeping. Pain was  severe initially for 15 minutes. Pt states pain is gradually getting better. He felt skin night pain started and skin felt warm but no longer warm   States first night 10 level pain. Next morning after onset level 8. Since then gradual decrease. Now pain only tender to touch. Now 1-2 level pain.   No pain after eating. No fever, no chills or sweating. No nausea or vomiting. Last bm was Tuesday. Pt has some intermittent constipation. For long time pt has had 2 bm's a week and has to use prune juice. No excess flutulence.   Pt has gallbladder. Appendectomy 80 years of age.  No back pain. Does report remote history of kidney stone about 4-5 years ago.  Pt last ate this morning.   Review of Systems  Constitutional: Negative for chills, fatigue and fever.  Respiratory: Negative for cough, chest tightness, shortness of breath and wheezing.   Cardiovascular: Negative for chest pain and palpitations.  Gastrointestinal: Positive for abdominal pain. Negative for constipation.  Genitourinary: Negative for dysuria, flank pain and frequency.  Musculoskeletal: Negative for back pain.  Skin: Negative for rash.  Hematological: Negative for adenopathy. Does not bruise/bleed easily.  Psychiatric/Behavioral: Negative for behavioral problems, confusion and sleep disturbance. The patient is not nervous/anxious and is not hyperactive.     Past Medical History:  Diagnosis Date  . Aortic calcification ([place]) 02/09/2015  . Appendicitis   . Back pain 02/09/2015  . CAD (coronary artery disease)    2 stents  . Cataract    bilateral  . Chicken pox   . Diabetes mellitus type 2 in obese ([place]) 04/11/2016  . Diabetes type 2, controlled ([place])   . Dysphagia, unspecified(787.20)  03/20/2014  . Hereditary and idiopathic peripheral neuropathy 02/09/2015  . Hyperlipidemia   . Hypertension   . Hypothyroidism   . Light headedness 07/04/2017  . Measles   . Medicare annual wellness visit, subsequent 06/14/2015  . Melanoma ([place])    Scalp. 2012  . Mumps as a child  . Thyroid disease    [place] Hyperthyroidism     Social History   Socioeconomic History  . Marital status: Married    Spouse name: Not on file  . Number of children: 2  . Years of education: Not on file  . Highest education level: Not on file  Occupational History  . Not on file  Tobacco Use  . Smoking status: Former Smoker    Packs/day: 2.00    Years: 30.00    Pack years: 60.00    Types: Cigarettes    Start date: 12/27/1983  . Smokeless tobacco: Never Used  Vaping Use  . Vaping Use: Never used  Substance and Sexual Activity  . Alcohol use: Yes    Alcohol/week: 0.0 standard drinks    Comment: occasionally  . Drug use: No  . 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   Other Topics Concern  . Not on file  Social History Narrative  . Not on file   Social Determinants of Health   Financial Resource Strain: Not on file  Food Insecurity: Not on file  Transportation Needs: Not on file  Physical  Activity: Not on file  Stress: Not on file  Social Connections: Not on file  Intimate Partner Violence: Not on file    Past Surgical History:  Procedure Laterality Date  . APPENDECTOMY    . BACK SURGERY     x2 lumbar  . BACK SURGERY     late 1990s had 2 surgeries, first surgery lifted a heavy engine ruptured disds, at L4 and L5, cleaned discs no hardware very helpful. 2 years later while recovering from angiogram with weights applied to left leg caused a recurrence and required surgery again in same area with disc repair  . CARDIAC CATHETERIZATION    . CARDIAC CATHETERIZATION N/A 05/11/2016   Procedure: Left Heart Cath and Coronary  Angiography;  Surgeon: Belva Crome, MD;  Location: [facility];  Service: Cardiovascular;  Laterality: N/A;  . CARDIAC CATHETERIZATION N/A 05/11/2016   Procedure: Intravascular Pressure Wire/FFR Study;  Surgeon: Belva Crome, MD;  Location: [facility];  Service: Cardiovascular;  Laterality: N/A;  . CORONARY ANGIOPLASTY WITH STENT PLACEMENT     2 stents  . CORONARY ARTERY BYPASS GRAFT N/A 05/17/2016   Procedure: CORONARY ARTERY BYPASS GRAFTING (CABG) x 6 using left mammory artery and right greater saphenous vein harvested endoscopically. Story to LAD, SVG to Diagonal, SVG Sequential to OM2 and ramus intermediate, SVG Sequential to RCA and PDA;  Surgeon: [name], MD;  Location: [facility];  Service: Open Heart Surgery;  Laterality: N/A;  . KNEE ARTHROSCOPY     right knee  . LUMBAR LAMINECTOMY/DECOMPRESSION MICRODISCECTOMY Bilateral 04/10/2015   Procedure: LUMBAR LAMINECTOMY/DECOMPRESSION MICRODISCECTOMY 2 LEVELS;  Surgeon: Ashok Pall, MD;  Location: [facility];  Service: Neurosurgery;  Laterality: Bilateral;  Bilateral L45 L5S1 Laminectomy and Foraminotomy  . ROTATOR CUFF REPAIR Right   . SKIN CANCER EXCISION     melanoma on scalp  . TEE WITHOUT CARDIOVERSION N/A 05/17/2016   Procedure: TRANSESOPHAGEAL ECHOCARDIOGRAM (TEE);  Surgeon: [name], MD;  Location: [facility];  Service: Open Heart Surgery;  Laterality: N/A;  . TONSILLECTOMY AND ADENOIDECTOMY    . WISDOM TOOTH EXTRACTION      Family History  Problem Relation Age of Onset  . Heart disease Father   . Emphysema Father   . Hypertension Father   . Cancer Father        lung/ healthy  . Heart disease Mother        Deceased  . Cancer Mother        breast  . Hypertension Mother   . Cancer Maternal Grandmother        colon  . Diabetes Brother        type 2  . Heart disease Brother   . Pulmonary embolism Son        vasculiti  . Depression Paternal Grandmother   . Depression Paternal Aunt     No Known  Allergies  Current Outpatient Medications on File Prior to Visit  Medication Sig Dispense Refill  . aspirin 81 MG tablet Take 81 mg by mouth daily.    Marland Kitchen atorvastatin (LIPITOR) 80 MG tablet Take 1 tablet (80 mg total) by mouth daily at 6 PM. 90 tablet 1  . Blood Glucose Monitoring Suppl ([place]) w/Device KIT USE TO CHECK BLOOD SUGAR ONCE A DAY AND AS NEEDED. DX CODE: E11.8 1 kit 0  . glipiZIDE (GLUCOTROL) 5 MG tablet TAKE 1 TABLET EVERY MORNING BEFORE BREAKFAST AND TAKE 1TABLET IN THE IN THE  EVENING 180 tablet 1  . glucose blood (ONETOUCH VERIO) test strip Use to check blood sugar once a day and as needed.  Dx code: E11.8 100 each 1  . Lancets (ONETOUCH DELICA PLUS [id number]) MISC Use to check blood sugar once a day and as needed.  Dx code: E11.8 100 each 1  . levothyroxine (SYNTHROID) 100 MCG tablet Take 1 tablet (100 mcg total) by mouth daily. 90 tablet 1  . metFORMIN (GLUCOPHAGE-XR) 500 MG 24 hr tablet Take 2 tablets (1,000 mg total) by mouth 2 (two) times daily. (Patient taking differently: Take 500 mg by mouth 2 (two) times daily.) 360 tablet 1  . pantoprazole (PROTONIX) 40 MG tablet Take 1 tablet (40 mg total) by mouth daily. 30 tablet 11   Current Facility-Administered Medications on File Prior to Visit  Medication Dose Route Frequency Provider Last Rate Last Admin  . 0.9 %  sodium chloride infusion  500 mL Intravenous Once Jackquline Denmark, MD        BP (!) 127/51   Pulse 60   Temp 97.8 F (36.6 C)   Resp 20   Ht 6' (1.829 m)   Wt 199 lb (90.3 kg)   SpO2 99%   BMI 26.99 kg/m      Objective:   Physical Exam   General Mental Status- Alert. General Appearance- Not in acute distress.   Skin General: Color- Normal Color. Moisture- Normal Moisture.  Neck Carotid Arteries- Normal color. Moisture- Normal Moisture. No carotid bruits. No JVD.  Chest and Lung Exam Auscultation: Breath Sounds:-Normal.  Cardiovascular Auscultation:Rythm- Regular. Murmurs &  Other Heart Sounds:Auscultation of the heart reveals- No Murmurs.  Abdomen Inspection:-Inspeection Normal. Palpation/Percussion:Note:No mass. Palpation and Percussion of the abdomen reveal- faint low level Tender(pt told me almost nonexistent on palpation), Non Distended + BS, no rebound or guarding.   Neurologic Cranial Nerve exam:- CN III-XII intact(No nystagmus), symmetric smile. Strength:- 5/5 equal and symmetric strength both upper and lower extremities.     Assessment & Plan:  You do have some recent right upper quadrant pain over the last week.  At times pain level was as high as 10 out of 10.  Most recently minimal pain.  Placed order for abdomen ultrasound.  Want to evaluate gallbladder.  Discussed possible differential diagnoses today.  Will get CBC, CMP and lipase.  We did urinalysis today as well.  Hoping that you get scheduled for ultrasound this weekend and asking for stat results read.  If before you get ultrasound done you have recurrent severe type pain as discussed then recommend ED evaluation.  Follow-up in 7 days or as needed.  Mackie Pai, PA-C

## 2021-05-21 NOTE — Patient Instructions (Addendum)
You do have some recent right upper quadrant pain over the last week.  At times pain level was as high as 10 out of 10.  Most recently minimal pain.  Placed order for abdomen ultrasound.  Want to evaluate gallbladder.  Discussed possible differential diagnoses today.  Will get CBC, CMP and lipase.  We did urinalysis today as well.  Hoping that you get scheduled for ultrasound this weekend and asking for stat results read.  If before you get ultrasound done you have recurrent severe type pain as discussed then recommend ED evaluation.  Follow-up in 7 days or as needed.  If ultrasound negative but having increasingly severe pain than consider CT imaging.

## 2021-05-26 ENCOUNTER — Other Ambulatory Visit: Payer: Self-pay

## 2021-05-26 ENCOUNTER — Ambulatory Visit (HOSPITAL_BASED_OUTPATIENT_CLINIC_OR_DEPARTMENT_OTHER)
Admission: RE | Admit: 2021-05-26 | Discharge: 2021-05-26 | Disposition: A | Discharge status: Home / Self Care | Payer: Medicare Other | Source: Ambulatory Visit | Attending: Medical | Admitting: Medical

## 2021-05-26 DIAGNOSIS — R1011 Right upper quadrant pain: Secondary | ICD-10-CM | POA: Insufficient documentation

## 2021-05-29 ENCOUNTER — Encounter (HOSPITAL_BASED_OUTPATIENT_CLINIC_OR_DEPARTMENT_OTHER): Payer: Self-pay

## 2021-05-29 ENCOUNTER — Emergency Department (HOSPITAL_BASED_OUTPATIENT_CLINIC_OR_DEPARTMENT_OTHER)
Admission: EM | Admit: 2021-05-29 | Discharge: 2021-05-29 | Disposition: A | Discharge status: Home / Self Care | Payer: Medicare Other | Attending: Emergency Medicine | Admitting: Emergency Medicine

## 2021-05-29 ENCOUNTER — Emergency Department (HOSPITAL_BASED_OUTPATIENT_CLINIC_OR_DEPARTMENT_OTHER): Payer: Medicare Other

## 2021-05-29 ENCOUNTER — Other Ambulatory Visit: Payer: Self-pay

## 2021-05-29 DIAGNOSIS — I129 Hypertensive chronic kidney disease with stage 1 through stage 4 chronic kidney disease, or unspecified chronic kidney disease: Secondary | ICD-10-CM | POA: Diagnosis not present

## 2021-05-29 DIAGNOSIS — N183 Chronic kidney disease, stage 3 unspecified: Secondary | ICD-10-CM | POA: Insufficient documentation

## 2021-05-29 DIAGNOSIS — Z79899 Other long term (current) drug therapy: Secondary | ICD-10-CM | POA: Diagnosis not present

## 2021-05-29 DIAGNOSIS — E039 Hypothyroidism, unspecified: Secondary | ICD-10-CM | POA: Insufficient documentation

## 2021-05-29 DIAGNOSIS — M722 Plantar fascial fibromatosis: Secondary | ICD-10-CM | POA: Insufficient documentation

## 2021-05-29 DIAGNOSIS — I2510 Atherosclerotic heart disease of native coronary artery without angina pectoris: Secondary | ICD-10-CM | POA: Diagnosis not present

## 2021-05-29 DIAGNOSIS — Z87891 Personal history of nicotine dependence: Secondary | ICD-10-CM | POA: Diagnosis not present

## 2021-05-29 DIAGNOSIS — Z7984 Long term (current) use of oral hypoglycemic drugs: Secondary | ICD-10-CM | POA: Diagnosis not present

## 2021-05-29 DIAGNOSIS — Z7982 Long term (current) use of aspirin: Secondary | ICD-10-CM | POA: Insufficient documentation

## 2021-05-29 DIAGNOSIS — Z951 Presence of aortocoronary bypass graft: Secondary | ICD-10-CM | POA: Insufficient documentation

## 2021-05-29 DIAGNOSIS — M79672 Pain in left foot: Secondary | ICD-10-CM | POA: Diagnosis not present

## 2021-05-29 DIAGNOSIS — E1122 Type 2 diabetes mellitus with diabetic chronic kidney disease: Secondary | ICD-10-CM | POA: Insufficient documentation

## 2021-05-29 MED ORDER — IBUPROFEN 400 MG PO TABS: 400.0000 mg | ORAL_TABLET | Freq: Three times a day (TID) | ORAL | 0 refills | Status: DC | PRN

## 2021-05-29 MED ORDER — IBUPROFEN 400 MG PO TABS
400.0000 mg | ORAL_TABLET | Freq: Once | ORAL | Status: AC
  Administered 2021-05-29: 400 mg via ORAL
  Filled 2021-05-29: qty 1

## 2021-05-29 NOTE — Discharge Instructions (Signed)
Take medications as needed for pain.  Consider following up with Dr. Raeford Razor sports medicine if the symptoms persist.  Please review the discharge instructions regarding treatment for plantar fasciitis

## 2021-05-29 NOTE — ED Provider Notes (Signed)
[facility] Provider Note   CSN: [id number] Arrival date & time: 05/29/21  1943     History Chief Complaint  Patient presents with  . Foot Pain    Jack Wade is a 80 y.o. male.  HPI   Patient presented with complaints of left foot pain.  Patient states he noticed the symptoms when he woke up this morning.  His foot was stiff and sore.  Tends to increase when he sitting for long period of time.  Hurts when he gets up and starts walking although does tend to get all the better.  He has not had any fevers or chills.  No recent strenuous activity.  No fevers or chills.  No known trauma.  Past Medical History:  Diagnosis Date  . Aortic calcification ([place]) 02/09/2015  . Appendicitis   . Back pain 02/09/2015  . CAD (coronary artery disease)    2 stents  . Cataract    bilateral  . Chicken pox   . Diabetes mellitus type 2 in obese ([place]) 04/11/2016  . Diabetes type 2, controlled ([place])   . Dysphagia, unspecified(787.20) 03/20/2014  . Hereditary and idiopathic peripheral neuropathy 02/09/2015  . Hyperlipidemia   . Hypertension   . Hypothyroidism   . Light headedness 07/04/2017  . Measles   . Medicare annual wellness visit, subsequent 06/14/2015  . Melanoma ([place])    Scalp. 2012  . Mumps as a child  . Thyroid disease    [place] Hyperthyroidism    Patient Active Problem List   Diagnosis Date Noted  . Constipation 09/18/2018  . Nocturia 03/07/2018  . Proteinuria 03/07/2018  . Left foot pain 12/07/2017  . Dystrophic nail 12/07/2017  . Light headedness 07/04/2017  . Fatigue 06/06/2016  . Chronic renal disease, stage III ([place]) 06/06/2016  . Angina decubitus ([place])   . Type 2 diabetes with complication ([place]) 49/17/9150  . Left arm pain 07/14/2015  . Medicare annual wellness visit, subsequent 06/14/2015  . HNP (herniated nucleus pulposus), lumbar 04/10/2015  . Peripheral vascular disease ([place]) 03/18/2015  . Hereditary and idiopathic peripheral  neuropathy 02/09/2015  . Back pain 02/09/2015  . Aortic calcification ([place]) 02/09/2015  . Hx of CABG 05/17/16 03/23/2014  . HTN (hypertension) 03/23/2014  . H/O tobacco use, presenting hazards to health 03/23/2014  . Preventative health care 03/23/2014  . Pain in joint, ankle and foot 03/23/2014  . Dysphagia, unspecified(787.20) 03/20/2014  . Melanoma ([place])   . Hypothyroidism   . Hyperlipidemia 12/22/2013    Past Surgical History:  Procedure Laterality Date  . APPENDECTOMY    . BACK SURGERY     x2 lumbar  . BACK SURGERY     late 1990s had 2 surgeries, first surgery lifted a heavy engine ruptured disds, at L4 and L5, cleaned discs no hardware very helpful. 2 years later while recovering from angiogram with weights applied to left leg caused a recurrence and required surgery again in same area with disc repair  . CARDIAC CATHETERIZATION    . CARDIAC CATHETERIZATION N/A 05/11/2016   Procedure: Left Heart Cath and Coronary Angiography;  Surgeon: Belva Crome, MD;  Location: [facility];  Service: Cardiovascular;  Laterality: N/A;  . CARDIAC CATHETERIZATION N/A 05/11/2016   Procedure: Intravascular Pressure Wire/FFR Study;  Surgeon: Belva Crome, MD;  Location: [facility];  Service: Cardiovascular;  Laterality: N/A;  . CORONARY ANGIOPLASTY WITH STENT PLACEMENT     2 stents  . CORONARY ARTERY BYPASS GRAFT  N/A 05/17/2016   Procedure: CORONARY ARTERY BYPASS GRAFTING (CABG) x 6 using left mammory artery and right greater saphenous vein harvested endoscopically. [place] to LAD, SVG to Diagonal, SVG Sequential to OM2 and ramus intermediate, SVG Sequential to RCA and PDA;  Surgeon: [name], MD;  Location: [place] [facility];  Service: Open Heart Surgery;  Laterality: N/A;  . KNEE ARTHROSCOPY     right knee  . LUMBAR LAMINECTOMY/DECOMPRESSION MICRODISCECTOMY Bilateral 04/10/2015   Procedure: LUMBAR LAMINECTOMY/DECOMPRESSION MICRODISCECTOMY 2 LEVELS;  Surgeon: Ashok Pall, MD;  Location: [place] [facility];  Service: Neurosurgery;  Laterality: Bilateral;  Bilateral L45 L5S1 Laminectomy and Foraminotomy  . ROTATOR CUFF REPAIR Right   . SKIN CANCER EXCISION     melanoma on scalp  . TEE WITHOUT CARDIOVERSION N/A 05/17/2016   Procedure: TRANSESOPHAGEAL ECHOCARDIOGRAM (TEE);  Surgeon: [name], MD;  Location: [place] [facility];  Service: Open Heart Surgery;  Laterality: N/A;  . TONSILLECTOMY AND ADENOIDECTOMY    . WISDOM TOOTH EXTRACTION         Family History  Problem Relation Age of Onset  . Heart disease Father   . Emphysema Father   . Hypertension Father   . Cancer Father        lung/ healthy  . Heart disease Mother        Deceased  . Cancer Mother        breast  . Hypertension Mother   . Cancer Maternal Grandmother        colon  . Diabetes Brother        type 2  . Heart disease Brother   . Pulmonary embolism Son        vasculiti  . Depression Paternal Grandmother   . Depression Paternal Aunt     Social History   Tobacco Use  . Smoking status: Former Smoker    Packs/day: 2.00    Years: 30.00    Pack years: 60.00    Types: Cigarettes    Start date: 12/27/1983  . Smokeless tobacco: Never Used  Vaping Use  . Vaping Use: Never used  Substance Use Topics  . Alcohol use: Yes    Alcohol/week: 0.0 standard drinks    Comment: occasionally  . Drug use: No    Home Medications Prior to Admission medications   Medication Sig Start Date End Date Taking? Authorizing Provider  ibuprofen (ADVIL) 400 MG tablet Take 1 tablet (400 mg total) by mouth every 8 (eight) hours as needed. 05/29/21  Yes Dorie Rank, MD  aspirin 81 MG tablet Take 81 mg by mouth daily.    [provider]  atorvastatin (LIPITOR) 80 MG tablet Take 1 tablet (80 mg total) by mouth daily at 6 PM. 01/07/21   Mosie Lukes, MD  Blood Glucose Monitoring Suppl ([place]) w/Device KIT USE TO CHECK BLOOD SUGAR ONCE A DAY AND AS NEEDED. DX CODE: E11.8 05/17/21   Mosie Lukes, MD   glipiZIDE (GLUCOTROL) 5 MG tablet TAKE 1 TABLET EVERY MORNING BEFORE BREAKFAST AND TAKE 1TABLET IN THE IN THE EVENING 01/04/21   Mosie Lukes, MD  glucose blood (ONETOUCH VERIO) test strip Use to check blood sugar once a day and as needed.  Dx code: E11.8 04/06/21   Mosie Lukes, MD  Lancets (ONETOUCH DELICA PLUS [id number]) MISC Use to check blood sugar once a day and as needed.  Dx code: E11.8 04/06/21   Mosie Lukes, MD  levothyroxine (SYNTHROID) 100 MCG tablet Take  1 tablet (100 mcg total) by mouth daily. 01/07/21   Mosie Lukes, MD  metFORMIN (GLUCOPHAGE-XR) 500 MG 24 hr tablet Take 2 tablets (1,000 mg total) by mouth 2 (two) times daily. Patient taking differently: Take 500 mg by mouth 2 (two) times daily. 01/07/21   Mosie Lukes, MD  pantoprazole (PROTONIX) 40 MG tablet Take 1 tablet (40 mg total) by mouth daily. 04/30/21   Jackquline Denmark, MD    Allergies    Patient has no known allergies.  Review of Systems   Review of Systems  All other systems reviewed and are negative.   Physical Exam Updated Vital Signs BP (!) 173/78   Pulse 66   Temp 98.4 F (36.9 C) (Oral)   Resp 16   Ht 1.829 m (6')   Wt 88.9 kg   SpO2 100%   BMI 26.58 kg/m   Physical Exam Vitals and nursing note reviewed.  Constitutional:      General: He is not in acute distress.    Appearance: He is well-developed.  HENT:     Head: Normocephalic and atraumatic.     Right Ear: External ear normal.     Left Ear: External ear normal.  Eyes:     General: No scleral icterus.       Right eye: No discharge.        Left eye: No discharge.     Conjunctiva/sclera: Conjunctivae normal.  Neck:     Trachea: No tracheal deviation.  Cardiovascular:     Rate and Rhythm: Normal rate.  Pulmonary:     Effort: Pulmonary effort is normal. No respiratory distress.     Breath sounds: No stridor.  Abdominal:     General: There is no distension.  Musculoskeletal:        General: Tenderness present. No swelling  or deformity.     Cervical back: Neck supple.     Comments: Mild tenderness palpation plantar aspect of left foot, no erythema or edema, extremities warm and well perfused, cap refill is brisk  Skin:    General: Skin is warm and dry.     Findings: No rash.  Neurological:     Mental Status: He is alert.     Cranial Nerves: Cranial nerve deficit: no gross deficits.     ED Results / Procedures / Treatments   Labs (all labs ordered are listed, but only abnormal results are displayed) Labs Reviewed - No data to display  EKG None  Radiology DG Foot Complete Left  Result Date: 05/29/2021 CLINICAL DATA:  Pain.  No known injury. EXAM: LEFT FOOT - COMPLETE 3+ VIEW COMPARISON:  None. FINDINGS: Osseous alignment is normal. Bone mineralization is normal. No fracture line or displaced fracture fragment. No focal cortical irregularity or osseous lesion. No significant degenerative change. Vascular calcifications within the soft tissues about the ankle. Soft tissues about the LEFT foot are otherwise unremarkable. IMPRESSION: 1. No acute findings. No osseous fracture or dislocation. 2. Vascular calcifications. Electronically Signed   By: Franki Cabot M.D.   On: 05/29/2021 21:41    Procedures Procedures   Medications Ordered in ED Medications  ibuprofen (ADVIL) tablet 400 mg (400 mg Oral Given 05/29/21 2230)    ED Course  I have reviewed the triage vital signs and the nursing notes.  Pertinent labs & imaging results that were available during my care of the patient were reviewed by me and considered in my medical decision making (see chart for details).  MDM Rules/Calculators/A&P                          Patient presents with atraumatic foot pain.  Exam is reassuring.  No signs of vascular compromise.  No signs of infection.  He does have tenderness in the plantar fascia area.  Suspect plantar fasciitis.  We will treat with NSAIDs.  Will provide instructions regarding stretching exercises.   Discussed outpatient follow-up with PCP or sports medicine for further treatment Final Clinical Impression(s) / ED Diagnoses Final diagnoses:  Plantar fasciitis    Rx / DC Orders ED Discharge Orders         Ordered    ibuprofen (ADVIL) 400 MG tablet  Every 8 hours PRN        05/29/21 2212           Dorie Rank, MD 05/30/21 1503

## 2021-05-29 NOTE — ED Triage Notes (Signed)
Pt states he woke up this morning and his left foot was stiff and hurting. Gets better with movement and walking but gets stiff and sore when he sits for long periods. Ambulated to triage without difficulty.

## 2021-06-23 ENCOUNTER — Encounter: Payer: Self-pay | Admitting: Family Medicine

## 2021-07-08 ENCOUNTER — Other Ambulatory Visit: Payer: Self-pay

## 2021-07-08 ENCOUNTER — Other Ambulatory Visit (INDEPENDENT_AMBULATORY_CARE_PROVIDER_SITE_OTHER): Payer: Medicare Other

## 2021-07-08 DIAGNOSIS — E7800 Pure hypercholesterolemia, unspecified: Secondary | ICD-10-CM | POA: Diagnosis not present

## 2021-07-08 DIAGNOSIS — E118 Type 2 diabetes mellitus with unspecified complications: Secondary | ICD-10-CM | POA: Diagnosis not present

## 2021-07-08 DIAGNOSIS — I10 Essential (primary) hypertension: Secondary | ICD-10-CM

## 2021-07-08 LAB — COMPREHENSIVE METABOLIC PANEL
ALT: 13 U/L (ref 0–53)
AST: 19 U/L (ref 0–37)
Albumin: 4.2 g/dL (ref 3.5–5.2)
Alkaline Phosphatase: 106 U/L (ref 39–117)
BUN: 24 mg/dL — ABNORMAL HIGH (ref 6–23)
CO2: 26 mEq/L (ref 19–32)
Calcium: 9.2 mg/dL (ref 8.4–10.5)
Chloride: 103 mEq/L (ref 96–112)
Creatinine, Ser: 1.42 mg/dL (ref 0.40–1.50)
GFR: 47.29 mL/min — ABNORMAL LOW (ref >60.00)
Glucose, Bld: 198 mg/dL — ABNORMAL HIGH (ref 70–99)
Potassium: 4.8 mEq/L (ref 3.5–5.1)
Sodium: 137 mEq/L (ref 135–145)
Total Bilirubin: 0.6 mg/dL (ref 0.2–1.2)
Total Protein: 6.6 g/dL (ref 6.0–8.3)

## 2021-07-08 LAB — CBC
HCT: 41.3 % (ref 39.0–52.0)
Hemoglobin: 13.9 g/dL (ref 13.0–17.0)
MCHC: 33.5 g/dL (ref 30.0–36.0)
MCV: 83.7 fl (ref 78.0–100.0)
Platelets: 226 10*3/uL (ref 150.0–400.0)
RBC: 4.94 Mil/uL (ref 4.22–5.81)
RDW: 13.8 % (ref 11.5–15.5)
WBC: 6.5 10*3/uL (ref 4.0–10.5)

## 2021-07-08 LAB — LIPID PANEL
Cholesterol: 98 mg/dL (ref 0–200)
HDL: 36.6 mg/dL — ABNORMAL LOW (ref >39.00)
LDL Cholesterol: 35 mg/dL (ref 0–99)
NonHDL: 61.62
Total CHOL/HDL Ratio: 3
Triglycerides: 134 mg/dL (ref 0.0–149.0)
VLDL: 26.8 mg/dL (ref 0.0–40.0)

## 2021-07-08 LAB — HEMOGLOBIN A1C: Hgb A1c MFr Bld: 7.9 % — ABNORMAL HIGH (ref 4.6–6.5)

## 2021-07-08 LAB — TSH: TSH: 6.12 u[IU]/mL — ABNORMAL HIGH (ref 0.35–5.50)

## 2021-07-12 ENCOUNTER — Other Ambulatory Visit: Payer: Self-pay | Admitting: Family Medicine

## 2021-07-12 DIAGNOSIS — E785 Hyperlipidemia, unspecified: Secondary | ICD-10-CM

## 2021-07-12 DIAGNOSIS — E118 Type 2 diabetes mellitus with unspecified complications: Secondary | ICD-10-CM

## 2021-07-15 ENCOUNTER — Ambulatory Visit (INDEPENDENT_AMBULATORY_CARE_PROVIDER_SITE_OTHER): Payer: Medicare Other | Admitting: Family Medicine

## 2021-07-15 ENCOUNTER — Encounter: Payer: Self-pay | Admitting: Family Medicine

## 2021-07-15 ENCOUNTER — Other Ambulatory Visit: Payer: Self-pay

## 2021-07-15 VITALS — BP 110/58 | HR 61 | Temp 97.4°F | Resp 12 | Wt 202.6 lb

## 2021-07-15 DIAGNOSIS — I10 Essential (primary) hypertension: Secondary | ICD-10-CM | POA: Diagnosis not present

## 2021-07-15 DIAGNOSIS — E118 Type 2 diabetes mellitus with unspecified complications: Secondary | ICD-10-CM | POA: Diagnosis not present

## 2021-07-15 DIAGNOSIS — E7800 Pure hypercholesterolemia, unspecified: Secondary | ICD-10-CM

## 2021-07-15 DIAGNOSIS — E038 Other specified hypothyroidism: Secondary | ICD-10-CM

## 2021-07-15 DIAGNOSIS — N183 Chronic kidney disease, stage 3 unspecified: Secondary | ICD-10-CM | POA: Diagnosis not present

## 2021-07-15 DIAGNOSIS — I2510 Atherosclerotic heart disease of native coronary artery without angina pectoris: Secondary | ICD-10-CM | POA: Diagnosis not present

## 2021-07-15 MED ORDER — LEVOTHYROXINE SODIUM 112 MCG PO TABS: 112.0000 ug | ORAL_TABLET | Freq: Every day | ORAL | 1 refills | Status: DC

## 2021-07-15 NOTE — Patient Instructions (Addendum)
Shingrix is the new shingles shot, 2 shots over 2-6 months, confirm coverage with insurance and document, then can return here for shots with nurse appt or at pharmacy    Paxlovid or Molnupiravir is the new COVID medication we can give you if you get COVID so make sure you test if you have symptoms because we have to treat by day 5 of symptoms for it to be effective. If you are positive let us know so we can treat. If a home test is negative and your symptoms are persistent get a PCR test. Can check testing locations at [email or web address] If you are positive we will make an appointment with [place] and we will send in Paxlovid if you would like it. Check with your pharmacy before we meet to confirm they have it in stock, if they do not then we can get the prescription at the [facility]

## 2021-07-16 ENCOUNTER — Other Ambulatory Visit (INDEPENDENT_AMBULATORY_CARE_PROVIDER_SITE_OTHER): Payer: Medicare Other

## 2021-07-16 DIAGNOSIS — E7800 Pure hypercholesterolemia, unspecified: Secondary | ICD-10-CM | POA: Diagnosis not present

## 2021-07-16 DIAGNOSIS — I10 Essential (primary) hypertension: Secondary | ICD-10-CM

## 2021-07-16 DIAGNOSIS — E118 Type 2 diabetes mellitus with unspecified complications: Secondary | ICD-10-CM

## 2021-07-16 LAB — COMPREHENSIVE METABOLIC PANEL
ALT: 13 U/L (ref 0–53)
AST: 16 U/L (ref 0–37)
Albumin: 4.1 g/dL (ref 3.5–5.2)
Alkaline Phosphatase: 108 U/L (ref 39–117)
BUN: 16 mg/dL (ref 6–23)
CO2: 27 mEq/L (ref 19–32)
Calcium: 9.2 mg/dL (ref 8.4–10.5)
Chloride: 103 mEq/L (ref 96–112)
Creatinine, Ser: 1.42 mg/dL (ref 0.40–1.50)
GFR: 47.28 mL/min — ABNORMAL LOW (ref >60.00)
Glucose, Bld: 177 mg/dL — ABNORMAL HIGH (ref 70–99)
Potassium: 4.3 mEq/L (ref 3.5–5.1)
Sodium: 137 mEq/L (ref 135–145)
Total Bilirubin: 0.6 mg/dL (ref 0.2–1.2)
Total Protein: 6.6 g/dL (ref 6.0–8.3)

## 2021-07-16 LAB — CBC
HCT: 41.8 % (ref 39.0–52.0)
Hemoglobin: 13.9 g/dL (ref 13.0–17.0)
MCHC: 33.1 g/dL (ref 30.0–36.0)
MCV: 84.1 fl (ref 78.0–100.0)
Platelets: 235 10*3/uL (ref 150.0–400.0)
RBC: 4.98 Mil/uL (ref 4.22–5.81)
RDW: 13.8 % (ref 11.5–15.5)
WBC: 7 10*3/uL (ref 4.0–10.5)

## 2021-07-16 LAB — LIPID PANEL
Cholesterol: 89 mg/dL (ref 0–200)
HDL: 38.1 mg/dL — ABNORMAL LOW (ref >39.00)
LDL Cholesterol: 27 mg/dL (ref 0–99)
NonHDL: 50.82
Total CHOL/HDL Ratio: 2
Triglycerides: 118 mg/dL (ref 0.0–149.0)
VLDL: 23.6 mg/dL (ref 0.0–40.0)

## 2021-07-16 LAB — MICROALBUMIN / CREATININE URINE RATIO
Creatinine,U: 166.1 mg/dL
Microalb Creat Ratio: 0.4 mg/g (ref 0.0–30.0)
Microalb, Ur: <0.7 mg/dL (ref 0.0–1.9)

## 2021-07-16 LAB — TSH: TSH: 6.12 u[IU]/mL — ABNORMAL HIGH (ref 0.35–5.50)

## 2021-07-18 NOTE — Assessment & Plan Note (Signed)
Hydrate and monitor 

## 2021-07-18 NOTE — Assessment & Plan Note (Signed)
hgba1c acceptable at 7.9, minimize simple carbs. Increase exercise as tolerated. Continue current meds for now and he is interested in a referral to Endocrinology to discuss options. He is hesitant to try newer agents secondary to financial constraints. Will continue current meds until he needs sees them

## 2021-07-18 NOTE — Assessment & Plan Note (Signed)
Tolerating statin, encouraged heart healthy diet, avoid trans fats, minimize simple carbs and saturated fats. Increase exercise as tolerated 

## 2021-07-18 NOTE — Assessment & Plan Note (Signed)
Well controlled, no changes to meds. Encouraged heart healthy diet such as the DASH diet and exercise as tolerated.  °

## 2021-07-18 NOTE — Progress Notes (Signed)
Subjective:    Patient ID: Jack Wade, male    DOB: 11-14-1941, 80 y.o.   MRN: [id number]  Chief Complaint  Patient presents with   Follow-up    HPI Patient is in today for follow up on chronic medical concerns including diabetes and hypothyroidism. No recent febrile illness or hospitalizations. No complaints of polyuria or polydipsia. He notes his blood sugar continues to run high 100s into 200s. He is eating well and staying active. Denies CP/palp/SOB/HA/congestion/fevers/GI or GU c/o. Taking meds as prescribed   Past Medical History:  Diagnosis Date   Aortic calcification ([place]) 02/09/2015   Appendicitis    Back pain 02/09/2015   CAD (coronary artery disease)    2 stents   Cataract    bilateral   Chicken pox    Diabetes mellitus type 2 in obese ([place]) 04/11/2016   Diabetes type 2, controlled ([place])    Dysphagia, unspecified(787.20) 03/20/2014   Hereditary and idiopathic peripheral neuropathy 02/09/2015   Hyperlipidemia    Hypertension    Hypothyroidism    Light headedness 07/04/2017   Measles    Medicare annual wellness visit, subsequent 06/14/2015   Melanoma ([place])    Scalp. 2012   Mumps as a child   Thyroid disease    Beoming Hyperthyroidism    Past Surgical History:  Procedure Laterality Date   APPENDECTOMY     BACK SURGERY     x2 lumbar   BACK SURGERY     late 1990s had 2 surgeries, first surgery lifted a heavy engine ruptured disds, at L4 and L5, cleaned discs no hardware very helpful. 2 years later while recovering from angiogram with weights applied to left leg caused a recurrence and required surgery again in same area with disc repair   [place] N/A 05/11/2016   Procedure: Left Heart Cath and Coronary Angiography;  Surgeon: Belva Crome, MD;  Location: [facility];  Service: Cardiovascular;  Laterality: N/A;   CARDIAC CATHETERIZATION N/A 05/11/2016   Procedure: Intravascular Pressure Wire/FFR Study;   Surgeon: Belva Crome, MD;  Location: [facility];  Service: Cardiovascular;  Laterality: N/A;   CORONARY ANGIOPLASTY WITH STENT PLACEMENT     2 stents   CORONARY ARTERY BYPASS GRAFT N/A 05/17/2016   Procedure: CORONARY ARTERY BYPASS GRAFTING (CABG) x 6 using left mammory artery and right greater saphenous vein harvested endoscopically. [place] to LAD, SVG to Diagonal, SVG Sequential to OM2 and ramus intermediate, SVG Sequential to RCA and PDA;  Surgeon: [name], MD;  [facility];  Service: Open Heart Surgery;  Laterality: N/A;   KNEE ARTHROSCOPY     right knee   LUMBAR LAMINECTOMY/DECOMPRESSION MICRODISCECTOMY Bilateral 04/10/2015   Procedure: LUMBAR LAMINECTOMY/DECOMPRESSION MICRODISCECTOMY 2 LEVELS;  Surgeon: Ashok Pall, MD;  Location: [facility];  Service: Neurosurgery;  Laterality: Bilateral;  Bilateral L45 L5S1 Laminectomy and Foraminotomy   ROTATOR CUFF REPAIR Right    SKIN CANCER EXCISION     melanoma on scalp   TEE WITHOUT CARDIOVERSION N/A 05/17/2016   Procedure: TRANSESOPHAGEAL ECHOCARDIOGRAM (TEE);  Surgeon: [name], MD;  Location: [facility];  Service: Open Heart Surgery;  Laterality: N/A;   TONSILLECTOMY AND ADENOIDECTOMY     WISDOM TOOTH EXTRACTION      Family History  Problem Relation Age of Onset   Heart disease Father    Emphysema Father    Hypertension Father    Cancer Father  lung/ healthy   Heart disease Mother        Deceased   Cancer Mother        breast   Hypertension Mother    Cancer Maternal Grandmother        colon   Diabetes Brother        type 2   Heart disease Brother    Pulmonary embolism Son        vasculiti   Depression Paternal Grandmother    Depression Paternal Aunt     Social History   Socioeconomic History   Marital status: Married    Spouse name: Not on file   Number of children: 2   Years of education: Not on file   Highest education level: Not on file  Occupational History   Not on file  Tobacco  Use   Smoking status: Former    Packs/day: 2.00    Years: 30.00    Pack years: 60.00    Types: Cigarettes    Start date: 12/27/1983   Smokeless tobacco: Never  Vaping Use   Vaping Use: Never used  Substance and Sexual Activity   Alcohol use: Yes    Alcohol/week: 0.0 standard drinks    Comment: occasionally   Drug use: No   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   Other Topics Concern   Not on file  Social History Narrative   Not on file   Social Determinants of Health   Financial Resource Strain: Not on file  Food Insecurity: Not on file  Transportation Needs: Not on file  Physical Activity: Not on file  Stress: Not on file  Social Connections: Not on file  Intimate Partner Violence: Not on file    Outpatient Medications Prior to Visit  Medication Sig Dispense Refill   aspirin 81 MG tablet Take 81 mg by mouth daily.     atorvastatin (LIPITOR) 80 MG tablet TAKE 1 TABLET BY MOUTH DAILY AT 6 PM. 90 tablet 1   Blood Glucose Monitoring Suppl ([facility]) w/Device KIT USE TO CHECK BLOOD SUGAR ONCE A DAY AND AS NEEDED. DX CODE: E11.8 1 kit 0   glipiZIDE (GLUCOTROL) 5 MG tablet TAKE 1 TABLET EVERY MORNING BEFORE BREAKFAST AND TAKE 1/2 TABLET IN THE IN THE EVENING 135 tablet 1   glucose blood (ONETOUCH VERIO) test strip Use to check blood sugar once a day and as needed.  Dx code: E11.8 100 each 1   Lancets (ONETOUCH DELICA PLUS [id number]) MISC Use to check blood sugar once a day and as needed.  Dx code: E11.8 100 each 1   metFORMIN (GLUCOPHAGE-XR) 500 MG 24 hr tablet Take 2 tablets (1,000 mg total) by mouth 2 (two) times daily. (Patient taking differently: Take 500 mg by mouth 2 (two) times daily.) 360 tablet 1   pantoprazole (PROTONIX) 40 MG tablet Take 1 tablet (40 mg total) by mouth daily. 30 tablet 11   levothyroxine (SYNTHROID) 100 MCG tablet Take 1 tablet (100 mcg total) by mouth daily. 90 tablet 1    ibuprofen (ADVIL) 400 MG tablet Take 1 tablet (400 mg total) by mouth every 8 (eight) hours as needed. 21 tablet 0   Facility-Administered Medications Prior to Visit  Medication Dose Route Frequency Provider Last Rate Last Admin   0.9 %  sodium chloride infusion  500 mL Intravenous Once Jackquline Denmark, MD        No Known Allergies  Review of Systems  Constitutional:  Negative for fever and malaise/fatigue.  HENT:  Negative for congestion.   Eyes:  Negative for blurred vision.  Respiratory:  Negative for shortness of breath.   Cardiovascular:  Negative for chest pain, palpitations and leg swelling.  Gastrointestinal:  Negative for abdominal pain, blood in stool and nausea.  Genitourinary:  Negative for dysuria and frequency.  Musculoskeletal:  Negative for falls.  Skin:  Negative for rash.  Neurological:  Negative for dizziness, loss of consciousness and headaches.  Endo/Heme/Allergies:  Negative for environmental allergies.  Psychiatric/Behavioral:  Negative for depression. The patient is not nervous/anxious.       Objective:    Physical Exam Constitutional:      General: He is not in acute distress.    Appearance: Normal appearance. He is not ill-appearing or toxic-appearing.  HENT:     Head: Normocephalic and atraumatic.     Right Ear: External ear normal.     Left Ear: External ear normal.     Nose: Nose normal.  Eyes:     General:        Right eye: No discharge.        Left eye: No discharge.     Conjunctiva/sclera: Conjunctivae normal.  Cardiovascular:     Rate and Rhythm: Normal rate.  Pulmonary:     Effort: Pulmonary effort is normal.  Abdominal:     General: Bowel sounds are normal.     Palpations: Abdomen is soft.     Tenderness: There is no abdominal tenderness.  Skin:    Findings: No rash.  Neurological:     Mental Status: He is alert and oriented to person, place, and time.  Psychiatric:        Behavior: Behavior normal.    BP (!) 110/58 (BP  Location: Left Arm, Cuff Size: Normal)   Pulse 61   Temp (!) 97.4 F (36.3 C) (Oral)   Resp 12   Wt 202 lb 9.6 oz (91.9 kg)   SpO2 98%   BMI 27.48 kg/m  Wt Readings from Last 3 Encounters:  07/15/21 202 lb 9.6 oz (91.9 kg)  05/29/21 196 lb (88.9 kg)  05/21/21 199 lb (90.3 kg)    Diabetic Foot Exam - Simple   No data filed    Lab Results  Component Value Date   WBC 7.0 07/16/2021   HGB 13.9 07/16/2021   HCT 41.8 07/16/2021   PLT 235.0 07/16/2021   GLUCOSE 177 (H) 07/16/2021   CHOL 89 07/16/2021   TRIG 118.0 07/16/2021   HDL 38.10 (L) 07/16/2021   LDLDIRECT 43.0 04/11/2016   LDLCALC 27 07/16/2021   ALT 13 07/16/2021   AST 16 07/16/2021   NA 137 07/16/2021   K 4.3 07/16/2021   CL 103 07/16/2021   CREATININE 1.42 07/16/2021   BUN 16 07/16/2021   CO2 27 07/16/2021   TSH 6.12 (H) 07/16/2021   PSA 0.91 09/24/2020   INR 1.61 (H) 05/17/2016   HGBA1C 7.9 (H) 07/08/2021   MICROALBUR <0.7 07/16/2021    Lab Results  Component Value Date   TSH 6.12 (H) 07/16/2021   Lab Results  Component Value Date   WBC 7.0 07/16/2021   HGB 13.9 07/16/2021   HCT 41.8 07/16/2021   MCV 84.1 07/16/2021   PLT 235.0 07/16/2021   Lab Results  Component Value Date   NA 137 07/16/2021   K 4.3 07/16/2021   CO2 27 07/16/2021   GLUCOSE 177 (H) 07/16/2021  BUN 16 07/16/2021   CREATININE 1.42 07/16/2021   BILITOT 0.6 07/16/2021   ALKPHOS 108 07/16/2021   AST 16 07/16/2021   ALT 13 07/16/2021   PROT 6.6 07/16/2021   ALBUMIN 4.1 07/16/2021   CALCIUM 9.2 07/16/2021   ANIONGAP 7 10/01/2016   GFR 47.28 (L) 07/16/2021   Lab Results  Component Value Date   CHOL 89 07/16/2021   Lab Results  Component Value Date   HDL 38.10 (L) 07/16/2021   Lab Results  Component Value Date   LDLCALC 27 07/16/2021   Lab Results  Component Value Date   TRIG 118.0 07/16/2021   Lab Results  Component Value Date   CHOLHDL 2 07/16/2021   Lab Results  Component Value Date   HGBA1C 7.9 (H)  07/08/2021       Assessment & Plan:   Problem List Items Addressed This Visit     Hyperlipidemia    Tolerating statin, encouraged heart healthy diet, avoid trans fats, minimize simple carbs and saturated fats. Increase exercise as tolerated       Relevant Orders   Lipid panel (Completed)   Hypothyroidism    On Levothyroxine, continue to monitor, increase Levothyroxine to 112 mcg       Relevant Medications   levothyroxine (SYNTHROID) 112 MCG tablet   HTN (hypertension) - Primary    Well controlled, no changes to meds. Encouraged heart healthy diet such as the DASH diet and exercise as tolerated.        Relevant Orders   CBC (Completed)   Comprehensive metabolic panel (Completed)   TSH (Completed)   Type 2 diabetes with complication (HCC)    [id number] acceptable at 7.9, minimize simple carbs. Increase exercise as tolerated. Continue current meds for now and he is interested in a referral to Endocrinology to discuss options. He is hesitant to try newer agents secondary to financial constraints. Will continue current meds until he needs sees them       Relevant Orders   Microalbumin / creatinine urine ratio (Completed)   Ambulatory referral to Endocrinology   Chronic renal disease, stage III ([place])    Hydrate and monitor        I have discontinued Gwyndolyn Saxon C. Sargeant "Bill"'s levothyroxine and ibuprofen. I am also having him start on levothyroxine. Additionally, I am having him maintain his aspirin, metFORMIN, OneTouch Delica Plus [id number], OneTouch Verio, pantoprazole, OneTouch Verio Flex System, atorvastatin, and glipiZIDE. We will continue to administer sodium chloride.  Meds ordered this encounter  Medications   levothyroxine (SYNTHROID) 112 MCG tablet    Sig: Take 1 tablet (112 mcg total) by mouth daily.    Dispense:  90 tablet    Refill:  1     Penni Homans, MD

## 2021-07-18 NOTE — Assessment & Plan Note (Addendum)
On Levothyroxine, continue to monitor, increase Levothyroxine to 112 mcg

## 2021-07-26 ENCOUNTER — Encounter: Payer: Self-pay | Admitting: Family Medicine

## 2021-07-26 ENCOUNTER — Telehealth (INDEPENDENT_AMBULATORY_CARE_PROVIDER_SITE_OTHER): Payer: Medicare Other | Admitting: Family Medicine

## 2021-07-26 DIAGNOSIS — U071 COVID-19: Secondary | ICD-10-CM | POA: Insufficient documentation

## 2021-07-26 DIAGNOSIS — I2510 Atherosclerotic heart disease of native coronary artery without angina pectoris: Secondary | ICD-10-CM

## 2021-07-26 MED ORDER — MOLNUPIRAVIR EUA 200MG CAPSULE: 4.0000 | ORAL_CAPSULE | Freq: Two times a day (BID) | ORAL | 0 refills | Status: AC

## 2021-07-26 NOTE — Progress Notes (Signed)
MyChart Video Visit    Virtual Visit via Video Note   This visit type was conducted due to national recommendations for restrictions regarding the COVID-19 Pandemic (e.g. social distancing) in an effort to limit this patient's exposure and mitigate transmission in our community. This patient is at least at moderate risk for complications without adequate follow up. This format is felt to be most appropriate for this patient at this time. Physical exam was limited by quality of the video and audio technology used for the visit. Alinda Dooms was able to get the patient set up on a video visit.  Patient location: home alone  Patient and provider in visit Provider location: Office  I discussed the limitations of evaluation and management by telemedicine and the availability of in person appointments. The patient expressed understanding and agreed to proceed.  Visit Date: 07/26/2021  Today's healthcare provider: Ann Held, DO   H  Subjective:    Patient ID: Jack Wade, male    DOB: [birth year], 80 y.o.   MRN: [id number]  Chief Complaint  Patient presents with   Covid Positive    HPI Patient is in today for + covid this am and symptoms started last night, + sore throat, no fever, no body aches,  no cough today  he did have a lot of coughing last night,   no other complaints   Past Medical History:  Diagnosis Date   Aortic calcification ([place]) 02/09/2015   Appendicitis    Back pain 02/09/2015   CAD (coronary artery disease)    2 stents   Cataract    bilateral   Chicken pox    Diabetes mellitus type 2 in obese ([place]) 04/11/2016   Diabetes type 2, controlled ([place])    Dysphagia, unspecified(787.20) 03/20/2014   Hereditary and idiopathic peripheral neuropathy 02/09/2015   Hyperlipidemia    Hypertension    Hypothyroidism    Light headedness 07/04/2017   Measles    Medicare annual wellness visit, subsequent 06/14/2015   Melanoma ([place])    Scalp. 2012   Mumps  as a child   Thyroid disease    Beoming Hyperthyroidism    Past Surgical History:  Procedure Laterality Date   APPENDECTOMY     BACK SURGERY     x2 lumbar   BACK SURGERY     late 1990s had 2 surgeries, first surgery lifted a heavy engine ruptured disds, at L4 and L5, cleaned discs no hardware very helpful. 2 years later while recovering from angiogram with weights applied to left leg caused a recurrence and required surgery again in same area with disc repair   [place] N/A 05/11/2016   Procedure: Left Heart Cath and Coronary Angiography;  Surgeon: Belva Crome, MD;  Location: [facility];  Service: Cardiovascular;  Laterality: N/A;   CARDIAC CATHETERIZATION N/A 05/11/2016   Procedure: Intravascular Pressure Wire/FFR Study;  Surgeon: Belva Crome, MD;  Location: [facility];  Service: Cardiovascular;  Laterality: N/A;   CORONARY ANGIOPLASTY WITH STENT PLACEMENT     2 stents   CORONARY ARTERY BYPASS GRAFT N/A 05/17/2016   Procedure: CORONARY ARTERY BYPASS GRAFTING (CABG) x 6 using left mammory artery and right greater saphenous vein harvested endoscopically. [place] to LAD, SVG to Diagonal, SVG Sequential to OM2 and ramus intermediate, SVG Sequential to RCA and PDA;  Surgeon: [name], MD;  Location: [facility];  Service: Open Heart Surgery;  Laterality:  N/A;   KNEE ARTHROSCOPY     right knee   LUMBAR LAMINECTOMY/DECOMPRESSION MICRODISCECTOMY Bilateral 04/10/2015   Procedure: LUMBAR LAMINECTOMY/DECOMPRESSION MICRODISCECTOMY 2 LEVELS;  Surgeon: Ashok Pall, MD;  Location: [facility];  Service: Neurosurgery;  Laterality: Bilateral;  Bilateral L45 L5S1 Laminectomy and Foraminotomy   ROTATOR CUFF REPAIR Right    SKIN CANCER EXCISION     melanoma on scalp   TEE WITHOUT CARDIOVERSION N/A 05/17/2016   Procedure: TRANSESOPHAGEAL ECHOCARDIOGRAM (TEE);  Surgeon: [name], MD;  Location: [facility];  Service: Open Heart Surgery;   Laterality: N/A;   TONSILLECTOMY AND ADENOIDECTOMY     WISDOM TOOTH EXTRACTION      Family History  Problem Relation Age of Onset   Heart disease Father    Emphysema Father    Hypertension Father    Cancer Father        lung/ healthy   Heart disease Mother        Deceased   Cancer Mother        breast   Hypertension Mother    Cancer Maternal Grandmother        colon   Diabetes Brother        type 2   Heart disease Brother    Pulmonary embolism Son        vasculiti   Depression Paternal Grandmother    Depression Paternal Aunt     Social History   Socioeconomic History   Marital status: Married    Spouse name: Not on file   Number of children: 2   Years of education: Not on file   Highest education level: Not on file  Occupational History   Not on file  Tobacco Use   Smoking status: Former    Packs/day: 2.00    Years: 30.00    Pack years: 60.00    Types: Cigarettes    Start date: 12/27/1983   Smokeless tobacco: Never  Vaping Use   Vaping Use: Never used  Substance and Sexual Activity   Alcohol use: Yes    Alcohol/week: 0.0 standard drinks    Comment: occasionally   Drug use: No   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   Other Topics Concern   Not on file  Social History Narrative   Not on file   Social Determinants of Health   Financial Resource Strain: Not on file  Food Insecurity: Not on file  Transportation Needs: Not on file  Physical Activity: Not on file  Stress: Not on file  Social Connections: Not on file  Intimate Partner Violence: Not on file    Outpatient Medications Prior to Visit  Medication Sig Dispense Refill   aspirin 81 MG tablet Take 81 mg by mouth daily.     atorvastatin (LIPITOR) 80 MG tablet TAKE 1 TABLET BY MOUTH DAILY AT 6 PM. 90 tablet 1   Blood Glucose Monitoring Suppl ([facility]) w/Device KIT USE TO CHECK BLOOD SUGAR ONCE A DAY AND AS NEEDED. DX  CODE: E11.8 1 kit 0   glipiZIDE (GLUCOTROL) 5 MG tablet TAKE 1 TABLET EVERY MORNING BEFORE BREAKFAST AND TAKE 1/2 TABLET IN THE IN THE EVENING 135 tablet 1   glucose blood (ONETOUCH VERIO) test strip Use to check blood sugar once a day and as needed.  Dx code: E11.8 100 each 1   Lancets (ONETOUCH DELICA PLUS [id number]) MISC Use to check blood sugar once a day and  as needed.  Dx code: E11.8 100 each 1   levothyroxine (SYNTHROID) 112 MCG tablet Take 1 tablet (112 mcg total) by mouth daily. 90 tablet 1   metFORMIN (GLUCOPHAGE-XR) 500 MG 24 hr tablet Take 2 tablets (1,000 mg total) by mouth 2 (two) times daily. (Patient taking differently: Take 500 mg by mouth 2 (two) times daily.) 360 tablet 1   pantoprazole (PROTONIX) 40 MG tablet Take 1 tablet (40 mg total) by mouth daily. 30 tablet 11   Facility-Administered Medications Prior to Visit  Medication Dose Route Frequency Provider Last Rate Last Admin   0.9 %  sodium chloride infusion  500 mL Intravenous Once Jackquline Denmark, MD        No Known Allergies  Review of Systems  Constitutional:  Positive for malaise/fatigue. Negative for chills and fever.  HENT:  Positive for sore throat. Negative for congestion and hearing loss.   Eyes:  Negative for discharge.  Respiratory:  Positive for cough. Negative for sputum production and shortness of breath.   Cardiovascular:  Negative for chest pain, palpitations and leg swelling.  Gastrointestinal:  Negative for abdominal pain, blood in stool, constipation, diarrhea, heartburn, nausea and vomiting.  Genitourinary:  Negative for dysuria, frequency, hematuria and urgency.  Musculoskeletal:  Negative for back pain, falls and myalgias.  Skin:  Negative for rash.  Neurological:  Negative for dizziness, sensory change, loss of consciousness, weakness and headaches.  Endo/Heme/Allergies:  Negative for environmental allergies. Does not bruise/bleed easily.  Psychiatric/Behavioral:  Negative for depression and  suicidal ideas. The patient is not nervous/anxious and does not have insomnia.       Objective:    Physical Exam Vitals and nursing note reviewed.  Pulmonary:     Effort: Pulmonary effort is normal.  Psychiatric:        Mood and Affect: Mood normal.        Behavior: Behavior normal.        Thought Content: Thought content normal.    There were no vitals taken for this visit. Wt Readings from Last 3 Encounters:  07/15/21 202 lb 9.6 oz (91.9 kg)  05/29/21 196 lb (88.9 kg)  05/21/21 199 lb (90.3 kg)    Diabetic Foot Exam - Simple   No data filed    Lab Results  Component Value Date   WBC 7.0 07/16/2021   HGB 13.9 07/16/2021   HCT 41.8 07/16/2021   PLT 235.0 07/16/2021   GLUCOSE 177 (H) 07/16/2021   CHOL 89 07/16/2021   TRIG 118.0 07/16/2021   HDL 38.10 (L) 07/16/2021   LDLDIRECT 43.0 04/11/2016   LDLCALC 27 07/16/2021   ALT 13 07/16/2021   AST 16 07/16/2021   NA 137 07/16/2021   K 4.3 07/16/2021   CL 103 07/16/2021   CREATININE 1.42 07/16/2021   BUN 16 07/16/2021   CO2 27 07/16/2021   TSH 6.12 (H) 07/16/2021   PSA 0.91 09/24/2020   INR 1.61 (H) 05/17/2016   HGBA1C 7.9 (H) 07/08/2021   MICROALBUR <0.7 07/16/2021    Lab Results  Component Value Date   TSH 6.12 (H) 07/16/2021   Lab Results  Component Value Date   WBC 7.0 07/16/2021   HGB 13.9 07/16/2021   HCT 41.8 07/16/2021   MCV 84.1 07/16/2021   PLT 235.0 07/16/2021   Lab Results  Component Value Date   NA 137 07/16/2021   K 4.3 07/16/2021   CO2 27 07/16/2021   GLUCOSE 177 (H) 07/16/2021   BUN 16 07/16/2021  CREATININE 1.42 07/16/2021   BILITOT 0.6 07/16/2021   ALKPHOS 108 07/16/2021   AST 16 07/16/2021   ALT 13 07/16/2021   PROT 6.6 07/16/2021   ALBUMIN 4.1 07/16/2021   CALCIUM 9.2 07/16/2021   ANIONGAP 7 10/01/2016   GFR 47.28 (L) 07/16/2021   Lab Results  Component Value Date   CHOL 89 07/16/2021   Lab Results  Component Value Date   HDL 38.10 (L) 07/16/2021   Lab Results   Component Value Date   LDLCALC 27 07/16/2021   Lab Results  Component Value Date   TRIG 118.0 07/16/2021   Lab Results  Component Value Date   CHOLHDL 2 07/16/2021   Lab Results  Component Value Date   HGBA1C 7.9 (H) 07/08/2021       Assessment & Plan:   Problem List Items Addressed This Visit       Unprioritized   COVID-19 - Primary    aniti viral sent in otc cough med Quarantine for 5 days --- if symptoms are improving he can leave with a mask on  Call or rto prn        Relevant Medications   molnupiravir EUA 200 mg CAPS   Other Relevant Orders   MyChart COVID-19 home monitoring program   Temperature monitoring    I am having [employer]" start on [employer]. I am also having him maintain his aspirin, metFORMIN, OneTouch Delica Plus [id number], OneTouch Verio, pantoprazole, OneTouch Verio Flex System, atorvastatin, glipiZIDE, and levothyroxine. We will continue to administer sodium chloride.  Meds ordered this encounter  Medications   molnupiravir EUA 200 mg CAPS    Sig: Take 4 capsules (800 mg total) by mouth 2 (two) times daily for 5 days.    Dispense:  40 capsule    Refill:  0    I discussed the assessment and treatment plan with the patient. The patient was provided an opportunity to ask questions and all were answered. The patient agreed with the plan and demonstrated an understanding of the instructions.   The patient was advised to call back or seek an in-person evaluation if the symptoms worsen or if the condition fails to improve as anticipated.     Ann Held, DO [place] [phone] (phone) [phone] (fax)  [place]

## 2021-07-26 NOTE — Assessment & Plan Note (Signed)
aniti viral sent in otc cough med Quarantine for 5 days --- if symptoms are improving he can leave with a mask on  Call or rto prn

## 2021-08-10 DIAGNOSIS — U071 COVID-19: Secondary | ICD-10-CM | POA: Diagnosis not present

## 2021-09-22 ENCOUNTER — Encounter: Payer: Self-pay | Admitting: Family Medicine

## 2021-09-22 DIAGNOSIS — H04123 Dry eye syndrome of bilateral lacrimal glands: Secondary | ICD-10-CM | POA: Diagnosis not present

## 2021-09-22 DIAGNOSIS — Z961 Presence of intraocular lens: Secondary | ICD-10-CM | POA: Diagnosis not present

## 2021-09-22 DIAGNOSIS — H11043 Peripheral pterygium, stationary, bilateral: Secondary | ICD-10-CM | POA: Diagnosis not present

## 2021-09-22 DIAGNOSIS — E113291 Type 2 diabetes mellitus with mild nonproliferative diabetic retinopathy without macular edema, right eye: Secondary | ICD-10-CM | POA: Diagnosis not present

## 2021-10-01 ENCOUNTER — Other Ambulatory Visit: Payer: Self-pay | Admitting: Family Medicine

## 2021-10-07 DIAGNOSIS — L814 Other melanin hyperpigmentation: Secondary | ICD-10-CM | POA: Diagnosis not present

## 2021-10-07 DIAGNOSIS — D1801 Hemangioma of skin and subcutaneous tissue: Secondary | ICD-10-CM | POA: Diagnosis not present

## 2021-10-07 DIAGNOSIS — Z85820 Personal history of malignant melanoma of skin: Secondary | ICD-10-CM | POA: Diagnosis not present

## 2021-10-07 DIAGNOSIS — L738 Other specified follicular disorders: Secondary | ICD-10-CM | POA: Diagnosis not present

## 2021-10-07 DIAGNOSIS — L570 Actinic keratosis: Secondary | ICD-10-CM | POA: Diagnosis not present

## 2021-10-13 ENCOUNTER — Other Ambulatory Visit: Payer: Self-pay | Admitting: Family Medicine

## 2021-10-17 DIAGNOSIS — Z23 Encounter for immunization: Secondary | ICD-10-CM | POA: Diagnosis not present

## 2021-10-19 ENCOUNTER — Encounter: Payer: Self-pay | Admitting: Internal Medicine

## 2021-10-19 ENCOUNTER — Ambulatory Visit (INDEPENDENT_AMBULATORY_CARE_PROVIDER_SITE_OTHER): Payer: Medicare Other | Admitting: Internal Medicine

## 2021-10-19 ENCOUNTER — Other Ambulatory Visit: Payer: Self-pay

## 2021-10-19 VITALS — BP 140/76 | HR 62 | Ht 72.0 in | Wt 207.2 lb

## 2021-10-19 DIAGNOSIS — E1159 Type 2 diabetes mellitus with other circulatory complications: Secondary | ICD-10-CM | POA: Diagnosis not present

## 2021-10-19 DIAGNOSIS — E118 Type 2 diabetes mellitus with unspecified complications: Secondary | ICD-10-CM

## 2021-10-19 DIAGNOSIS — E039 Hypothyroidism, unspecified: Secondary | ICD-10-CM | POA: Diagnosis not present

## 2021-10-19 DIAGNOSIS — E1142 Type 2 diabetes mellitus with diabetic polyneuropathy: Secondary | ICD-10-CM

## 2021-10-19 DIAGNOSIS — N183 Chronic kidney disease, stage 3 unspecified: Secondary | ICD-10-CM

## 2021-10-19 LAB — GLUCOSE, POCT (MANUAL RESULT ENTRY): POC Glucose: 173 mg/dl — AB (ref 70–99)

## 2021-10-19 MED ORDER — GLIPIZIDE 5 MG PO TABS
5.0000 mg | ORAL_TABLET | Freq: Two times a day (BID) | ORAL | 1 refills | Status: DC
Start: 2021-10-19 — End: 2022-04-25

## 2021-10-19 MED ORDER — METFORMIN HCL ER 500 MG PO TB24: 500.0000 mg | ORAL_TABLET | Freq: Two times a day (BID) | ORAL | 1 refills | Status: DC

## 2021-10-19 MED ORDER — DAPAGLIFLOZIN PROPANEDIOL 5 MG PO TABS: 5.0000 mg | ORAL_TABLET | Freq: Every day | ORAL | 6 refills | Status: DC

## 2021-10-19 NOTE — Patient Instructions (Signed)
Start Farxiga 5 mg, 1 tablet in the morning  Continue Glipizide 5 mg 1 tablet Before Breakfast and Supper  Continue Metformin 500 mg ,1 tablet twice a day   HOW TO TREAT LOW BLOOD SUGARS (Blood sugar LESS THAN 70 MG/DL) Please follow the RULE OF 15 for the treatment of hypoglycemia treatment (when your (blood sugars are less than 70 mg/dL)   STEP 1: Take 15 grams of carbohydrates when your blood sugar is low, which includes:  3-4 GLUCOSE TABS  OR 3-4 OZ OF JUICE OR REGULAR SODA OR ONE TUBE OF GLUCOSE GEL    STEP 2: RECHECK blood sugar in 15 MINUTES STEP 3: If your blood sugar is still low at the 15 minute recheck --> then, go back to STEP 1 and treat AGAIN with another 15 grams of carbohydrates.

## 2021-10-19 NOTE — Progress Notes (Signed)
Name: Jack Wade  MRN/ DOB: [id number], [birth year]   Age/ Sex: 80 y.o., male    PCP: Mosie Lukes, MD   Reason for Endocrinology Evaluation: Type 2 Diabetes Mellitus     Date of Initial Endocrinology Visit: 10/19/2021     PATIENT IDENTIFIER: Jack Wade is a 80 y.o. male with a past medical history of T2DM, Dyslipidemia and GERD . The patient presented for initial endocrinology clinic visit on 10/19/2021 for consultative assistance with his diabetes management.    HPI: Jack Wade was    Diagnosed with DM years ago  Prior Medications tried/Intolerance: as listed  Currently checking blood sugars occasionally  Hypoglycemia episodes : no               Hemoglobin A1c has ranged from 7.1% in 2019, peaking at 7.9% in 2022. Patient required assistance for hypoglycemia: no Patient has required hospitalization within the last 1 year from hyper or hypoglycemia: no  In terms of diet, the patient eats 3 meals, snacks in the evening . The pt drinks sugar sweetened beverages     HOME DIABETES REGIMEN: Glipizide 5 mg 1 tablet in the morning and half a tablet in the evening  Metformin 500 mg 1 tabs BID    Statin: yes ACE-I/ARB: no    METER DOWNLOAD SUMMARY: Unable to download    DIABETIC COMPLICATIONS: Microvascular complications:  CKD III, ? retinopathy ( Right eye - stable ) , neuropathy  Denies: neuropathy  Last eye exam: Completed 08/2021  Macrovascular complications:  CAD  Denies: PVD, CVA   PAST HISTORY: Past Medical History:  Past Medical History:  Diagnosis Date   Aortic calcification ([place]) 02/09/2015   Appendicitis    Back pain 02/09/2015   CAD (coronary artery disease)    2 stents   Cataract    bilateral   Chicken pox    Diabetes mellitus type 2 in obese ([place]) 04/11/2016   Diabetes type 2, controlled ([place])    Dysphagia, unspecified(787.20) 03/20/2014   Hereditary and idiopathic peripheral neuropathy 02/09/2015   Hyperlipidemia     Hypertension    Hypothyroidism    Light headedness 07/04/2017   Measles    Medicare annual wellness visit, subsequent 06/14/2015   Melanoma ([place])    Scalp. 2012   Mumps as a child   Thyroid disease    [place] Hyperthyroidism   Past Surgical History:  Past Surgical History:  Procedure Laterality Date   APPENDECTOMY     BACK SURGERY     x2 lumbar   BACK SURGERY     late 1990s had 2 surgeries, first surgery lifted a heavy engine ruptured disds, at L4 and L5, cleaned discs no hardware very helpful. 2 years later while recovering from angiogram with weights applied to left leg caused a recurrence and required surgery again in same area with disc repair   [place] N/A 05/11/2016   Procedure: Left Heart Cath and Coronary Angiography;  Surgeon: Belva Crome, MD;  Location: [facility];  Service: Cardiovascular;  Laterality: N/A;   CARDIAC CATHETERIZATION N/A 05/11/2016   Procedure: Intravascular Pressure Wire/FFR Study;  Surgeon: Belva Crome, MD;  Location: [facility];  Service: Cardiovascular;  Laterality: N/A;   CORONARY ANGIOPLASTY WITH STENT PLACEMENT     2 stents   CORONARY ARTERY BYPASS GRAFT N/A 05/17/2016   Procedure: CORONARY ARTERY BYPASS GRAFTING (CABG) x 6 using left mammory artery and right greater  saphenous vein harvested endoscopically. [place] to LAD, SVG to Diagonal, SVG Sequential to OM2 and ramus intermediate, SVG Sequential to RCA and PDA;  Surgeon: [name], MD;  Location: [facility];  Service: Open Heart Surgery;  Laterality: N/A;   KNEE ARTHROSCOPY     right knee   LUMBAR LAMINECTOMY/DECOMPRESSION MICRODISCECTOMY Bilateral 04/10/2015   Procedure: LUMBAR LAMINECTOMY/DECOMPRESSION MICRODISCECTOMY 2 LEVELS;  Surgeon: Ashok Pall, MD;  Location: [facility];  Service: Neurosurgery;  Laterality: Bilateral;  Bilateral L45 L5S1 Laminectomy and Foraminotomy   ROTATOR CUFF REPAIR Right    SKIN CANCER EXCISION      melanoma on scalp   TEE WITHOUT CARDIOVERSION N/A 05/17/2016   Procedure: TRANSESOPHAGEAL ECHOCARDIOGRAM (TEE);  Surgeon: [name], MD;  Location: [facility];  Service: Open Heart Surgery;  Laterality: N/A;   TONSILLECTOMY AND ADENOIDECTOMY     WISDOM TOOTH EXTRACTION      Social History:  reports that he has quit smoking. His smoking use included cigarettes. He started smoking about 37 years ago. He has a 60.00 pack-year smoking history. He has never used smokeless tobacco. He reports current alcohol use. He reports that he does not use drugs. Family History:  Family History  Problem Relation Age of Onset   Heart disease Father    Emphysema Father    Hypertension Father    Cancer Father        lung/ healthy   Heart disease Mother        Deceased   Cancer Mother        breast   Hypertension Mother    Cancer Maternal Grandmother        colon   Diabetes Brother        type 2   Heart disease Brother    Pulmonary embolism Son        vasculiti   Depression Paternal Grandmother    Depression Paternal Aunt      HOME MEDICATIONS: Allergies as of 10/19/2021   No Known Allergies      Medication List        Accurate as of October 19, 2021  3:05 PM. If you have any questions, ask your nurse or doctor.          aspirin 81 MG tablet Take 81 mg by mouth daily.   atorvastatin 80 MG tablet Commonly known as: LIPITOR TAKE 1 TABLET BY MOUTH DAILY AT 6 PM.   glipiZIDE 5 MG tablet Commonly known as: GLUCOTROL TAKE 1 TABLET EVERY MORNING BEFORE BREAKFAST AND TAKE 1/2 TABLET IN THE IN THE EVENING What changed: additional instructions   levothyroxine 112 MCG tablet Commonly known as: SYNTHROID Take 1 tablet (112 mcg total) by mouth daily.   metFORMIN 500 MG 24 hr tablet Commonly known as: GLUCOPHAGE-XR Take 2 tablets (1,000 mg total) by mouth 2 (two) times daily. What changed:  how much to take additional instructions   OneTouch Delica Plus [id number] Misc Use to  check blood sugar once a day and as needed.  Dx code: E11.8   OneTouch Verio Flex System w/Device Kit USE TO CHECK BLOOD SUGAR ONCE A DAY AND AS NEEDED. DX CODE: E11.8   OneTouch Verio test strip Generic drug: glucose blood USE TO CHECK BLOOD SUGAR ONCE A DAY AND AS NEEDED. DX CODE: E11.8   pantoprazole 40 MG tablet Commonly known as: PROTONIX Take 1 tablet (40 mg total) by mouth daily.         ALLERGIES: No Known Allergies  REVIEW OF SYSTEMS: A comprehensive ROS was conducted with the patient and is negative except as per HPI and below:  Review of Systems  Gastrointestinal:  Negative for diarrhea, nausea and vomiting.  Genitourinary:  Negative for dysuria.  Musculoskeletal:  Positive for joint pain.     OBJECTIVE:   VITAL SIGNS: BP 140/76 (BP Location: Left Arm, Patient Position: Sitting, Cuff Size: Small)   Pulse 62   Ht 6' (1.829 m)   Wt 207 lb 3.2 oz (94 kg)   SpO2 97%   BMI 28.10 kg/m    PHYSICAL EXAM:  General: Pt appears well and is in NAD  Neck: General: Supple without adenopathy or carotid bruits. Thyroid: Thyroid size normal.  No goiter or nodules appreciated.   Lungs: Clear with good BS bilat with no rales, rhonchi, or wheezes  Heart: RRR with normal S1 and S2 and no gallops; no murmurs; no rub  Abdomen: Normoactive bowel sounds, soft, nontender, without masses or organomegaly palpable  Extremities:  Lower extremities - No pretibial edema. No lesions.  Neuro: MS is good with appropriate affect, pt is alert and Ox3    DATA REVIEWED:  Lab Results  Component Value Date   HGBA1C 7.9 (H) 07/08/2021   HGBA1C 7.8 (H) 04/02/2021   HGBA1C 7.8 (H) 01/01/2021   Lab Results  Component Value Date   MICROALBUR <0.7 07/16/2021   LDLCALC 27 07/16/2021   CREATININE 1.42 07/16/2021   Lab Results  Component Value Date   MICRALBCREAT 0.4 07/16/2021    Lab Results  Component Value Date   CHOL 89 07/16/2021   HDL 38.10 (L) 07/16/2021   LDLCALC 27  07/16/2021   LDLDIRECT 43.0 04/11/2016   TRIG 118.0 07/16/2021   CHOLHDL 2 07/16/2021        ASSESSMENT / PLAN / RECOMMENDATIONS:   1) Type 2 Diabetes Mellitus, Sub-Optimally controlled, With CKD III, Neuropathic, retinopathic and macrovascular complications - Most recent A1c of 7.9 %. Goal A1c < 7.5%.    Plan: GENERAL: I have discussed with the patient the pathophysiology of diabetes. We went over the natural progression of the disease. We talked about both insulin resistance and insulin deficiency. We stressed the importance of lifestyle changes including diet and exercise. I explained the complications associated with diabetes including retinopathy, nephropathy, neuropathy as well as increased risk of cardiovascular disease. We went over the benefit seen with glycemic control.  Historically he has been limited due to high cost of medications We discussed various glycemic agents, the mechanism of action, side effects and benefits, patient agreed to try an SGLT2 inhibitors, he is concerned about the cost.  We discussed about prescribing this to his pharmacy but if this is cost prohibitive he is going to fill patient assistance forms that were provided to him today We discussed high risk of hypoglycemia with glipizide Limited glycemic agents due to CKD III  MEDICATIONS: Start Farxiga 5 mg 1 tablet daily Continue glipizide 5 mg, 1 tablet before breakfast and supper Continue metformin 500 mg 1 tablet twice a day  EDUCATION / INSTRUCTIONS: BG monitoring instructions: Patient is instructed to check his blood sugars 1 times a day, fasting. Call [facility] if: BG persistently < 70  I reviewed the Rule of 15 for the treatment of hypoglycemia in detail with the patient. Literature supplied.   2) Diabetic complications:  Eye: Does  have known diabetic retinopathy.  Neuro/ Feet: Does  have known diabetic peripheral neuropathy. Renal: Patient does  have known baseline  CKD. He  is not on an ACEI/ARB at present.  3) CKD III:    -He is currently not on an ACE inhibitor nor an ARB, we discussed the benefits of these medications and slowing down kidney disease progression.  We will consider initiating therapy on next visit, since I am starting him on Farxiga I did not want him to be on too many new medications in case he develops side effects   4) Hypothyroidism   -Patient is clinically euthyroid -No local neck symptoms -He occasionally has fatigue, as per TSH was elevated, repeat TSH today is normal  Medication Continue levothyroxine 112 MCG daily  Follow-up in 3 months Signed electronically by: Mack Guise, MD  [facility] End[facility]  [facility] [address] [place], [place] [address] Phone: [phone] FAX: [phone]   CC: Mosie Lukes, MD [address] [place] [address] Phone: [phone]  Fax: [phone]    Return to Endocrinology clinic as below: No future appointments.

## 2021-10-20 DIAGNOSIS — E119 Type 2 diabetes mellitus without complications: Secondary | ICD-10-CM | POA: Insufficient documentation

## 2021-10-20 DIAGNOSIS — E1142 Type 2 diabetes mellitus with diabetic polyneuropathy: Secondary | ICD-10-CM | POA: Insufficient documentation

## 2021-10-20 LAB — TSH: TSH: 2.33 u[IU]/mL (ref 0.35–5.50)

## 2021-10-20 MED ORDER — LEVOTHYROXINE SODIUM 112 MCG PO TABS: 112.0000 ug | ORAL_TABLET | Freq: Every day | ORAL | 3 refills | Status: DC

## 2021-10-25 DIAGNOSIS — Z23 Encounter for immunization: Secondary | ICD-10-CM | POA: Diagnosis not present

## 2021-10-26 ENCOUNTER — Telehealth: Payer: Self-pay | Admitting: Internal Medicine

## 2021-10-26 NOTE — Telephone Encounter (Signed)
Pt dropped off paperwork for provider to see and have filled out (documents in a white envelope) Document put at front office tray under providers name.

## 2021-10-26 NOTE — Telephone Encounter (Signed)
Paperwork placed on provider desk to signed

## 2021-10-26 NOTE — Telephone Encounter (Signed)
Paperwork faxed to [employer]

## 2021-11-01 DIAGNOSIS — H10012 Acute follicular conjunctivitis, left eye: Secondary | ICD-10-CM | POA: Diagnosis not present

## 2021-11-01 DIAGNOSIS — H02055 Trichiasis without entropian left lower eyelid: Secondary | ICD-10-CM | POA: Diagnosis not present

## 2021-11-17 ENCOUNTER — Telehealth: Payer: Self-pay | Admitting: Family Medicine

## 2021-11-17 NOTE — Telephone Encounter (Signed)
Left message for patient to call back and schedule Medicare Annual Wellness Visit (AWV) in office.   If not able to come in office, please offer to do virtually or by telephone.  Left office number and my jabber [phone].  Last AWV:08/24/2018  Please schedule at anytime with Nurse Health Advisor.

## 2021-11-23 ENCOUNTER — Telehealth: Payer: Self-pay

## 2021-11-23 NOTE — Telephone Encounter (Signed)
I have called and spoke to patient, patient states that he does not want to do a repeat colonoscopy.

## 2021-11-26 ENCOUNTER — Ambulatory Visit (INDEPENDENT_AMBULATORY_CARE_PROVIDER_SITE_OTHER): Payer: Medicare Other | Admitting: Family

## 2021-11-26 ENCOUNTER — Encounter: Payer: Self-pay | Admitting: Family

## 2021-11-26 VITALS — BP 110/60 | HR 78 | Temp 97.5°F | Resp 16 | Ht 72.0 in | Wt 200.4 lb

## 2021-11-26 DIAGNOSIS — J329 Chronic sinusitis, unspecified: Secondary | ICD-10-CM | POA: Insufficient documentation

## 2021-11-26 DIAGNOSIS — I2510 Atherosclerotic heart disease of native coronary artery without angina pectoris: Secondary | ICD-10-CM

## 2021-11-26 MED ORDER — AMOXICILLIN-POT CLAVULANATE 875-125 MG PO TABS: 1.0000 | ORAL_TABLET | Freq: Two times a day (BID) | ORAL | 0 refills | Status: DC

## 2021-11-26 NOTE — Patient Instructions (Addendum)
Please begin augmentin for sinus infection. You may use nasal saline rinse as needed as well as mucinex as needed. Call if symptoms worsen or if symptoms do not improve.

## 2021-11-26 NOTE — Progress Notes (Signed)
Subjective:   By signing my name below, I, Jack Wade, attest that this documentation has been prepared under the direction and in the presence of Debbrah Alar, NP  11/26/2021    Patient ID: Jack Wade, male    DOB: [birth year], 80 y.o.   MRN: [id number]  Chief Complaint  Patient presents with   Nasal Congestion    X2 weeks, pt states productive cough, headache, and sinus pain, no fever, Pt states Negative COVID tests on Monday and Wed.     HPI Patient is in today for an office visit.  Sinus congestion- He reports that for the past couple of weeks, he has been having sinus pain, congestion and some swelling at the sides of his throat. He thinks he is feeling better today. He tested negative for Covid-19 twice and was negative. Has been using mucinex which provides little relief. He reports that the coughing is worse at night.   Past Medical History:  Diagnosis Date   Aortic calcification ([place]) 02/09/2015   Appendicitis    Back pain 02/09/2015   CAD (coronary artery disease)    2 stents   Cataract    bilateral   Chicken pox    Diabetes mellitus type 2 in obese ([place]) 04/11/2016   Diabetes type 2, controlled ([place])    Dysphagia, unspecified(787.20) 03/20/2014   Hereditary and idiopathic peripheral neuropathy 02/09/2015   Hyperlipidemia    Hypertension    Hypothyroidism    Light headedness 07/04/2017   Measles    Medicare annual wellness visit, subsequent 06/14/2015   Melanoma ([place])    Scalp. 2012   Mumps as a child   Thyroid disease    [place] Hyperthyroidism    Past Surgical History:  Procedure Laterality Date   APPENDECTOMY     BACK SURGERY     x2 lumbar   BACK SURGERY     late 1990s had 2 surgeries, first surgery lifted a heavy engine ruptured disds, at L4 and L5, cleaned discs no hardware very helpful. 2 years later while recovering from angiogram with weights applied to left leg caused a recurrence and required surgery again in same area with disc repair    [place] N/A 05/11/2016   Procedure: Left Heart Cath and Coronary Angiography;  Surgeon: Belva Crome, MD;  Location: [facility];  Service: Cardiovascular;  Laterality: N/A;   CARDIAC CATHETERIZATION N/A 05/11/2016   Procedure: Intravascular Pressure Wire/FFR Study;  Surgeon: Belva Crome, MD;  Location: [facility];  Service: Cardiovascular;  Laterality: N/A;   CORONARY ANGIOPLASTY WITH STENT PLACEMENT     2 stents   CORONARY ARTERY BYPASS GRAFT N/A 05/17/2016   Procedure: CORONARY ARTERY BYPASS GRAFTING (CABG) x 6 using left mammory artery and right greater saphenous vein harvested endoscopically. [place] to LAD, SVG to Diagonal, SVG Sequential to OM2 and ramus intermediate, SVG Sequential to RCA and PDA;  Surgeon: [name], MD;  Location: [facility];  Service: Open Heart Surgery;  Laterality: N/A;   KNEE ARTHROSCOPY     right knee   LUMBAR LAMINECTOMY/DECOMPRESSION MICRODISCECTOMY Bilateral 04/10/2015   Procedure: LUMBAR LAMINECTOMY/DECOMPRESSION MICRODISCECTOMY 2 LEVELS;  Surgeon: Ashok Pall, MD;  Location: [facility];  Service: Neurosurgery;  Laterality: Bilateral;  Bilateral L45 L5S1 Laminectomy and Foraminotomy   ROTATOR CUFF REPAIR Right    SKIN CANCER EXCISION     melanoma on scalp   TEE WITHOUT CARDIOVERSION N/A 05/17/2016   Procedure:  TRANSESOPHAGEAL ECHOCARDIOGRAM (TEE);  Surgeon: [name], MD;  Location: [facility];  Service: Open Heart Surgery;  Laterality: N/A;   TONSILLECTOMY AND ADENOIDECTOMY     WISDOM TOOTH EXTRACTION      Family History  Problem Relation Age of Onset   Heart disease Father    Emphysema Father    Hypertension Father    Cancer Father        lung/ healthy   Heart disease Mother        Deceased   Cancer Mother        breast   Hypertension Mother    Cancer Maternal Grandmother        colon   Diabetes Brother        type 2   Heart disease Brother    Pulmonary embolism Son         vasculiti   Depression Paternal Grandmother    Depression Paternal Aunt     Social History   Socioeconomic History   Marital status: Married    Spouse name: Not on file   Number of children: 2   Years of education: Not on file   Highest education level: Not on file  Occupational History   Not on file  Tobacco Use   Smoking status: Former    Packs/day: 2.00    Years: 30.00    Pack years: 60.00    Types: Cigarettes    Start date: 12/27/1983   Smokeless tobacco: Never  Vaping Use   Vaping Use: Never used  Substance and Sexual Activity   Alcohol use: Yes    Alcohol/week: 0.0 standard drinks    Comment: occasionally   Drug use: No   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   Other Topics Concern   Not on file  Social History Narrative   Not on file   Social Determinants of Health   Financial Resource Strain: Not on file  Food Insecurity: Not on file  Transportation Needs: Not on file  Physical Activity: Not on file  Stress: Not on file  Social Connections: Not on file  Intimate Partner Violence: Not on file    Outpatient Medications Prior to Visit  Medication Sig Dispense Refill   aspirin 81 MG tablet Take 81 mg by mouth daily.     atorvastatin (LIPITOR) 80 MG tablet TAKE 1 TABLET BY MOUTH DAILY AT 6 PM. 90 tablet 1   Blood Glucose Monitoring Suppl ([place]) w/Device KIT USE TO CHECK BLOOD SUGAR ONCE A DAY AND AS NEEDED. DX CODE: E11.8 1 kit 0   dapagliflozin propanediol (FARXIGA) 5 MG TABS tablet Take 1 tablet (5 mg total) by mouth daily before breakfast. 30 tablet 6   glipiZIDE (GLUCOTROL) 5 MG tablet Take 1 tablet (5 mg total) by mouth 2 (two) times daily before a meal. TAKE 1 TABLET EVERY MORNING BEFORE BREAKFAST AND TAKE 1/2 TABLET IN THE IN THE EVENING 180 tablet 1   Lancets (ONETOUCH DELICA PLUS [id number]) MISC Use to check blood sugar once a day and as needed.  Dx code: E11.8 100 each  1   levothyroxine (SYNTHROID) 112 MCG tablet Take 1 tablet (112 mcg total) by mouth daily. 90 tablet 3   metFORMIN (GLUCOPHAGE-XR) 500 MG 24 hr tablet Take 1 tablet (500 mg total) by mouth in the morning and at bedtime. 180 tablet 1   ONETOUCH VERIO test strip USE TO CHECK BLOOD SUGAR ONCE  A DAY AND AS NEEDED. DX CODE: E11.8 100 strip 1   pantoprazole (PROTONIX) 40 MG tablet Take 1 tablet (40 mg total) by mouth daily. 30 tablet 11   Facility-Administered Medications Prior to Visit  Medication Dose Route Frequency Provider Last Rate Last Admin   0.9 %  sodium chloride infusion  500 mL Intravenous Once Jackquline Denmark, MD        No Known Allergies  Review of Systems  Constitutional:  Negative for fever.  HENT:  Positive for congestion and sinus pain.        (+) rhinorrhea  Eyes:  Negative for blurred vision.  Cardiovascular:  Negative for chest pain and leg swelling.  Gastrointestinal:  Negative for blood in stool, diarrhea, nausea and vomiting.  Genitourinary:  Negative for dysuria and frequency.  Musculoskeletal:  Negative for joint pain and myalgias.  Skin:  Negative for rash.  Neurological:  Negative for headaches.  Psychiatric/Behavioral:  Negative for depression. The patient is not nervous/anxious.       Objective:    Physical Exam Constitutional:      General: He is not in acute distress.    Appearance: Normal appearance.  HENT:     Head: Normocephalic and atraumatic.     Right Ear: Tympanic membrane and ear canal normal.     Left Ear: Tympanic membrane and ear canal normal.  Cardiovascular:     Rate and Rhythm: Normal rate and regular rhythm.     Heart sounds: No murmur heard. Pulmonary:     Effort: No respiratory distress.     Breath sounds: Normal breath sounds. No wheezing or rales.  Skin:    General: Skin is warm and dry.  Neurological:     Mental Status: He is alert and oriented to person, place, and time.  Psychiatric:        Behavior: Behavior normal.         Thought Content: Thought content normal.    BP 110/60 (BP Location: Left Arm, Patient Position: Sitting, Cuff Size: Normal)   Pulse 78   Temp (!) 97.5 F (36.4 C) (Oral)   Resp 16   Ht 6' (1.829 m)   Wt 200 lb 6.4 oz (90.9 kg)   SpO2 98%   BMI 27.18 kg/m  Wt Readings from Last 3 Encounters:  11/26/21 200 lb 6.4 oz (90.9 kg)  10/19/21 207 lb 3.2 oz (94 kg)  07/15/21 202 lb 9.6 oz (91.9 kg)       Assessment & Plan:   Problem List Items Addressed This Visit       Unprioritized   Sinusitis - Primary    New. Pt is advised as follows:  Please begin augmentin for sinus infection. You may use nasal saline rinse as needed as well as mucinex as needed. Call if symptoms worsen or if symptoms do not improve.       Relevant Medications   amoxicillin-clavulanate (AUGMENTIN) 875-125 MG tablet   Meds ordered this encounter  Medications   amoxicillin-clavulanate (AUGMENTIN) 875-125 MG tablet    Sig: Take 1 tablet by mouth 2 (two) times daily.    Dispense:  20 tablet    Refill:  0    Order Specific Question:   Supervising Provider    Answer:   Penni Homans A [[id number]]    I,Jack Wade,acting as a scribe for Nance Pear, NP.,have documented all relevant documentation on the behalf of Nance Pear, NP,as directed by  Nance Pear, NP while in  the presence of Nance Pear, NP.   I, Debbrah Alar, NP, personally preformed the services described in this documentation.  All medical record entries made by the scribe were at my direction and in my presence.  I have reviewed the chart and discharge instructions (if applicable) and agree that the record reflects my personal performance and is accurate and complete. 11/26/2021

## 2021-11-26 NOTE — Assessment & Plan Note (Signed)
New. Pt is advised as follows:  Please begin augmentin for sinus infection. You may use nasal saline rinse as needed as well as mucinex as needed. Call if symptoms worsen or if symptoms do not improve.

## 2022-01-03 ENCOUNTER — Encounter: Payer: Self-pay | Admitting: Gastroenterology

## 2022-01-12 ENCOUNTER — Telehealth: Payer: Self-pay

## 2022-01-12 NOTE — Telephone Encounter (Signed)
Error

## 2022-01-26 ENCOUNTER — Encounter: Payer: Self-pay | Admitting: Family Medicine

## 2022-01-27 ENCOUNTER — Ambulatory Visit (INDEPENDENT_AMBULATORY_CARE_PROVIDER_SITE_OTHER): Payer: Medicare Other | Admitting: Family Medicine

## 2022-01-27 ENCOUNTER — Encounter: Payer: Self-pay | Admitting: Family Medicine

## 2022-01-27 VITALS — BP 131/39 | HR 64 | Temp 97.5°F | Ht 72.0 in | Wt 201.8 lb

## 2022-01-27 DIAGNOSIS — J0120 Acute ethmoidal sinusitis, unspecified: Secondary | ICD-10-CM | POA: Diagnosis not present

## 2022-01-27 MED ORDER — DOXYCYCLINE HYCLATE 100 MG PO TABS: 100.0000 mg | ORAL_TABLET | Freq: Two times a day (BID) | ORAL | 0 refills | Status: AC

## 2022-01-27 MED ORDER — FLUTICASONE PROPIONATE 50 MCG/ACT NA SUSP: 2.0000 | Freq: Every day | NASAL | 0 refills | Status: DC

## 2022-01-27 NOTE — Progress Notes (Signed)
Acute Office Visit  Subjective:    Patient ID: Jack Wade, male    DOB: 1941/07/07, 81 y.o.   MRN: [id number]  Chief Complaint  Patient presents with   Sinusitis    HPI Patient is in today for sinusitis.  For the past 2 weeks, patient has had sore throat, increased mucus, post nasal drainage, rhinorrhea, occasional cough, sinus pressure. He got some initial relief with Mucinex, but none recently. He denies any ear pain, chest pain, dyspnea, nausea, vomiting, diarrhea, fevers.     Past Medical History:  Diagnosis Date   Aortic calcification ([place]) 02/09/2015   Appendicitis    Back pain 02/09/2015   CAD (coronary artery disease)    2 stents   Cataract    bilateral   Chicken pox    Diabetes mellitus type 2 in obese ([place]) 04/11/2016   Diabetes type 2, controlled ([place])    Dysphagia, unspecified(787.20) 03/20/2014   Hereditary and idiopathic peripheral neuropathy 02/09/2015   Hyperlipidemia    Hypertension    Hypothyroidism    Light headedness 07/04/2017   Measles    Medicare annual wellness visit, subsequent 06/14/2015   Melanoma ([place])    Scalp. 2012   Mumps as a child   Thyroid disease    [place] Hyperthyroidism    Past Surgical History:  Procedure Laterality Date   APPENDECTOMY     BACK SURGERY     x2 lumbar   BACK SURGERY     late 1990s had 2 surgeries, first surgery lifted a heavy engine ruptured disds, at L4 and L5, cleaned discs no hardware very helpful. 2 years later while recovering from angiogram with weights applied to left leg caused a recurrence and required surgery again in same area with disc repair   [place] N/A 05/11/2016   Procedure: Left Heart Cath and Coronary Angiography;  Surgeon: Belva Crome, MD;  Location: [facility];  Service: Cardiovascular;  Laterality: N/A;   CARDIAC CATHETERIZATION N/A 05/11/2016   Procedure: Intravascular Pressure Wire/FFR Study;  Surgeon: Belva Crome, MD;   Location: [facility];  Service: Cardiovascular;  Laterality: N/A;   CORONARY ANGIOPLASTY WITH STENT PLACEMENT     2 stents   CORONARY ARTERY BYPASS GRAFT N/A 05/17/2016   Procedure: CORONARY ARTERY BYPASS GRAFTING (CABG) x 6 using left mammory artery and right greater saphenous vein harvested endoscopically. [place] to LAD, SVG to Diagonal, SVG Sequential to OM2 and ramus intermediate, SVG Sequential to RCA and PDA;  Surgeon: [name], MD;  Location: [facility];  Service: Open Heart Surgery;  Laterality: N/A;   KNEE ARTHROSCOPY     right knee   LUMBAR LAMINECTOMY/DECOMPRESSION MICRODISCECTOMY Bilateral 04/10/2015   Procedure: LUMBAR LAMINECTOMY/DECOMPRESSION MICRODISCECTOMY 2 LEVELS;  Surgeon: Ashok Pall, MD;  Location: [facility];  Service: Neurosurgery;  Laterality: Bilateral;  Bilateral L45 L5S1 Laminectomy and Foraminotomy   ROTATOR CUFF REPAIR Right    SKIN CANCER EXCISION     melanoma on scalp   TEE WITHOUT CARDIOVERSION N/A 05/17/2016   Procedure: TRANSESOPHAGEAL ECHOCARDIOGRAM (TEE);  Surgeon: [name], MD;  Location: [facility];  Service: Open Heart Surgery;  Laterality: N/A;   TONSILLECTOMY AND ADENOIDECTOMY     WISDOM TOOTH EXTRACTION      Family History  Problem Relation Age of Onset   Heart disease Father    Emphysema Father    Hypertension Father    Cancer Father  lung/ healthy   Heart disease Mother        Deceased   Cancer Mother        breast   Hypertension Mother    Cancer Maternal Grandmother        colon   Diabetes Brother        type 2   Heart disease Brother    Pulmonary embolism Son        vasculiti   Depression Paternal Grandmother    Depression Paternal Aunt     Social History   Socioeconomic History   Marital status: Married    Spouse name: Not on file   Number of children: 2   Years of education: Not on file   Highest education level: Not on file  Occupational History   Not on file  Tobacco Use   Smoking status: Former     Packs/day: 2.00    Years: 30.00    Pack years: 60.00    Types: Cigarettes    Start date: 12/27/1983   Smokeless tobacco: Never  Vaping Use   Vaping Use: Never used  Substance and Sexual Activity   Alcohol use: Yes    Alcohol/week: 0.0 standard drinks    Comment: occasionally   Drug use: No   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   Other Topics Concern   Not on file  Social History Narrative   Not on file   Social Determinants of Health   Financial Resource Strain: Not on file  Food Insecurity: Not on file  Transportation Needs: Not on file  Physical Activity: Not on file  Stress: Not on file  Social Connections: Not on file  Intimate Partner Violence: Not on file    Outpatient Medications Prior to Visit  Medication Sig Dispense Refill   aspirin 81 MG tablet Take 81 mg by mouth daily.     atorvastatin (LIPITOR) 80 MG tablet TAKE 1 TABLET BY MOUTH DAILY AT 6 PM. 90 tablet 1   Blood Glucose Monitoring Suppl ([place]) w/Device KIT USE TO CHECK BLOOD SUGAR ONCE A DAY AND AS NEEDED. DX CODE: E11.8 1 kit 0   dapagliflozin propanediol (FARXIGA) 5 MG TABS tablet Take 1 tablet (5 mg total) by mouth daily before breakfast. 30 tablet 6   glipiZIDE (GLUCOTROL) 5 MG tablet Take 1 tablet (5 mg total) by mouth 2 (two) times daily before a meal. TAKE 1 TABLET EVERY MORNING BEFORE BREAKFAST AND TAKE 1/2 TABLET IN THE IN THE EVENING 180 tablet 1   Lancets (ONETOUCH DELICA PLUS [id number]) MISC Use to check blood sugar once a day and as needed.  Dx code: E11.8 100 each 1   levothyroxine (SYNTHROID) 112 MCG tablet Take 1 tablet (112 mcg total) by mouth daily. 90 tablet 3   metFORMIN (GLUCOPHAGE-XR) 500 MG 24 hr tablet Take 1 tablet (500 mg total) by mouth in the morning and at bedtime. 180 tablet 1   ONETOUCH VERIO test strip USE TO CHECK BLOOD SUGAR ONCE A DAY AND AS NEEDED. DX CODE: E11.8 100 strip 1    pantoprazole (PROTONIX) 40 MG tablet Take 1 tablet (40 mg total) by mouth daily. 30 tablet 11   amoxicillin-clavulanate (AUGMENTIN) 875-125 MG tablet Take 1 tablet by mouth 2 (two) times daily. 20 tablet 0   Facility-Administered Medications Prior to Visit  Medication Dose Route Frequency Provider Last Rate Last Admin   0.9 %  sodium chloride  infusion  500 mL Intravenous Once Jackquline Denmark, MD        No Known Allergies  Review of Systems All review of systems negative except what is listed in the HPI     Objective:    Physical Exam Vitals reviewed.  Constitutional:      Appearance: Normal appearance. He is normal weight.  HENT:     Head: Normocephalic and atraumatic.     Right Ear: Tympanic membrane normal.     Left Ear: Tympanic membrane normal.     Nose: Congestion present.     Mouth/Throat:     Mouth: Mucous membranes are moist.     Pharynx: Oropharynx is clear.  Eyes:     Extraocular Movements: Extraocular movements intact.     Conjunctiva/sclera: Conjunctivae normal.  Cardiovascular:     Rate and Rhythm: Normal rate and regular rhythm.     Pulses: Normal pulses.  Pulmonary:     Effort: Pulmonary effort is normal.     Breath sounds: Normal breath sounds.  Musculoskeletal:     Cervical back: Normal range of motion and neck supple. No tenderness.  Lymphadenopathy:     Cervical: No cervical adenopathy.  Skin:    General: Skin is warm and dry.  Neurological:     General: No focal deficit present.     Mental Status: He is alert and oriented to person, place, and time. Mental status is at baseline.  Psychiatric:        Mood and Affect: Mood normal.        Behavior: Behavior normal.        Thought Content: Thought content normal.        Judgment: Judgment normal.    BP (!) 131/39    Pulse 64    Temp (!) 97.5 F (36.4 C)    Ht 6' (1.829 m)    Wt 201 lb 12.8 oz (91.5 kg)    SpO2 98%    BMI 27.37 kg/m  Wt Readings from Last 3 Encounters:  01/27/22 201 lb 12.8 oz  (91.5 kg)  11/26/21 200 lb 6.4 oz (90.9 kg)  10/19/21 207 lb 3.2 oz (94 kg)    Health Maintenance Due  Topic Date Due   Pneumonia Vaccine 11+ Years old (2 - PCV) 10/31/2014   FOOT EXAM  09/16/2017   COLONOSCOPY (Pts 45-2yr Insurance coverage will need to be confirmed)  10/17/2021   COVID-19 Vaccine (6 - Booster for Moderna series) 12/20/2021   HEMOGLOBIN A1C  01/08/2022    There are no preventive care reminders to display for this patient.   Lab Results  Component Value Date   TSH 2.33 10/19/2021   Lab Results  Component Value Date   WBC 7.0 07/16/2021   HGB 13.9 07/16/2021   HCT 41.8 07/16/2021   MCV 84.1 07/16/2021   PLT 235.0 07/16/2021   Lab Results  Component Value Date   NA 137 07/16/2021   K 4.3 07/16/2021   CO2 27 07/16/2021   GLUCOSE 177 (H) 07/16/2021   BUN 16 07/16/2021   CREATININE 1.42 07/16/2021   BILITOT 0.6 07/16/2021   ALKPHOS 108 07/16/2021   AST 16 07/16/2021   ALT 13 07/16/2021   PROT 6.6 07/16/2021   ALBUMIN 4.1 07/16/2021   CALCIUM 9.2 07/16/2021   ANIONGAP 7 10/01/2016   GFR 47.28 (L) 07/16/2021   Lab Results  Component Value Date   CHOL 89 07/16/2021   Lab Results  Component Value Date   HDL  38.10 (L) 07/16/2021   Lab Results  Component Value Date   LDLCALC 27 07/16/2021   Lab Results  Component Value Date   TRIG 118.0 07/16/2021   Lab Results  Component Value Date   CHOLHDL 2 07/16/2021   Lab Results  Component Value Date   HGBA1C 7.9 (H) 07/08/2021       Assessment & Plan:   1. Acute non-recurrent ethmoidal sinusitis Start antibiotics and Flonase in addition to your Mucinex.  Continue supportive measures including rest, hydration, humidifier use, steam showers, warm compresses to sinuses, warm liquids with lemon and honey, and over-the-counter cough, cold, and analgesics as needed.   Please contact office for follow-up if symptoms do not improve or worsen. Seek emergency care if symptoms become severe.  -  doxycycline (VIBRA-TABS) 100 MG tablet; Take 1 tablet (100 mg total) by mouth 2 (two) times daily for 7 days.  Dispense: 14 tablet; Refill: 0 - fluticasone (FLONASE) 50 MCG/ACT nasal spray; Place 2 sprays into both nostrils daily.  Dispense: 1 g; Refill: 0   Follow-up if symptoms worsen or fail to improve.   Terrilyn Saver, NP

## 2022-01-27 NOTE — Patient Instructions (Signed)
Start antibiotics and Flonase in addition to your Mucinex.  Continue supportive measures including rest, hydration, humidifier use, steam showers, warm compresses to sinuses, warm liquids with lemon and honey, and over-the-counter cough, cold, and analgesics as needed.   Please contact office for follow-up if symptoms do not improve or worsen. Seek emergency care if symptoms become severe.

## 2022-01-27 NOTE — Telephone Encounter (Signed)
Scheduled for today.

## 2022-01-28 ENCOUNTER — Encounter: Payer: Self-pay | Admitting: Family Medicine

## 2022-02-06 ENCOUNTER — Other Ambulatory Visit: Payer: Self-pay | Admitting: Family Medicine

## 2022-02-06 DIAGNOSIS — E785 Hyperlipidemia, unspecified: Secondary | ICD-10-CM

## 2022-03-01 ENCOUNTER — Encounter: Payer: Self-pay | Admitting: Internal Medicine

## 2022-03-01 ENCOUNTER — Other Ambulatory Visit: Payer: Self-pay

## 2022-03-01 ENCOUNTER — Ambulatory Visit (INDEPENDENT_AMBULATORY_CARE_PROVIDER_SITE_OTHER): Payer: Medicare Other | Admitting: Internal Medicine

## 2022-03-01 VITALS — BP 140/76 | HR 66 | Ht 72.0 in | Wt 200.6 lb

## 2022-03-01 DIAGNOSIS — E1142 Type 2 diabetes mellitus with diabetic polyneuropathy: Secondary | ICD-10-CM | POA: Diagnosis not present

## 2022-03-01 DIAGNOSIS — E1165 Type 2 diabetes mellitus with hyperglycemia: Secondary | ICD-10-CM

## 2022-03-01 DIAGNOSIS — E1159 Type 2 diabetes mellitus with other circulatory complications: Secondary | ICD-10-CM

## 2022-03-01 DIAGNOSIS — R739 Hyperglycemia, unspecified: Secondary | ICD-10-CM

## 2022-03-01 DIAGNOSIS — E1122 Type 2 diabetes mellitus with diabetic chronic kidney disease: Secondary | ICD-10-CM | POA: Diagnosis not present

## 2022-03-01 DIAGNOSIS — N1831 Chronic kidney disease, stage 3a: Secondary | ICD-10-CM | POA: Diagnosis not present

## 2022-03-01 LAB — BASIC METABOLIC PANEL
BUN: 20 mg/dL (ref 6–23)
CO2: 26 mEq/L (ref 19–32)
Calcium: 9.4 mg/dL (ref 8.4–10.5)
Chloride: 102 mEq/L (ref 96–112)
Creatinine, Ser: 1.4 mg/dL (ref 0.40–1.50)
GFR: 47.89 mL/min — ABNORMAL LOW (ref >60.00)
Glucose, Bld: 157 mg/dL — ABNORMAL HIGH (ref 70–99)
Potassium: 4.6 mEq/L (ref 3.5–5.1)
Sodium: 137 mEq/L (ref 135–145)

## 2022-03-01 LAB — POCT GLYCOSYLATED HEMOGLOBIN (HGB A1C): Hemoglobin A1C: 7.1 % — AB (ref 4.0–5.6)

## 2022-03-01 NOTE — Progress Notes (Signed)
Name: Jack Wade  MRN/ DOB: [id number], [birth year]   Age/ Sex: 81 y.o., male    PCP: Mosie Lukes, MD   Reason for Endocrinology Evaluation: Type 2 Diabetes Mellitus     Date of Initial Endocrinology Visit: 10/19/2021    PATIENT IDENTIFIER: Jack Wade is a 81 y.o. male with a past medical history of T2DM, Dyslipidemia and GERD . The patient presented for initial endocrinology clinic visit on 10/19/2021 for consultative assistance with his diabetes management.    HPI: Jack Wade was    Diagnosed with DM years ago  Prior Medications tried/Intolerance: as listed  Currently checking blood sugars occasionally  Hypoglycemia episodes : no               Hemoglobin A1c has ranged from 7.1% in 2019, peaking at 7.9% in 2022. Patient required assistance for hypoglycemia: no Patient has required hospitalization within the last 1 year from hyper or hypoglycemia: no  In terms of diet, the patient eats 3 meals, snacks in the evening . The pt drinks sugar sweetened beverages    SUBJECTIVE:   During the last visit (10/19/2021): A1c 7.9% continued glipizide and metformin and started [place]      Today (03/01/22): Jack Wade  is here for a follow up on diabetes management . He  checks his blood sugars 1 times daily. The patient has not had hypoglycemic episodes since the last clinic visit.  Has noted frequency with [place] but no fever or dysuria  Denies nausea, vomiting or diarrhea    HOME DIABETES REGIMEN: Glipizide 5 mg 1 tablet BID  Metformin 500 mg 1 tabs BID  Farxiga 5 mg daily - through ot assistance    Statin: yes ACE-I/ARB: no    METER DOWNLOAD SUMMARY: 2/22-03/01/2022 Fingerstick Blood Glucose Tests = 6 Overall Mean FS Glucose = 123 Standard Deviation = 4  BG Ranges: Low = 118 High = 129    Hypoglycemic Events/30 Days: BG < 50 = 0 Episodes of symptomatic severe hypoglycemia = 0    DIABETIC COMPLICATIONS: Microvascular  complications:  CKD III, ? retinopathy ( Right eye - stable ) , neuropathy  Denies: neuropathy  Last eye exam: Completed 08/2021  Macrovascular complications:  CAD  Denies: PVD, CVA   PAST HISTORY: Past Medical History:  Past Medical History:  Diagnosis Date   Aortic calcification ([place]) 02/09/2015   Appendicitis    Back pain 02/09/2015   CAD (coronary artery disease)    2 stents   Cataract    bilateral   Chicken pox    Diabetes mellitus type 2 in obese ([place]) 04/11/2016   Diabetes type 2, controlled ([place])    Dysphagia, unspecified(787.20) 03/20/2014   Hereditary and idiopathic peripheral neuropathy 02/09/2015   Hyperlipidemia    Hypertension    Hypothyroidism    Light headedness 07/04/2017   Measles    Medicare annual wellness visit, subsequent 06/14/2015   Melanoma ([place])    Scalp. 2012   Mumps as a child   Thyroid disease    [place] Hyperthyroidism   Past Surgical History:  Past Surgical History:  Procedure Laterality Date   APPENDECTOMY     BACK SURGERY     x2 lumbar   BACK SURGERY     late 1990s had 2 surgeries, first surgery lifted a heavy engine ruptured disds, at L4 and L5, cleaned discs no hardware very helpful. 2 years later while recovering from angiogram with weights applied to left leg caused a recurrence  and required surgery again in same area with disc repair   CARDIAC CATHETERIZATION     CARDIAC CATHETERIZATION N/A 05/11/2016   Procedure: Left Heart Cath and Coronary Angiography;  Surgeon: Belva Crome, MD;  Location: [facility];  Service: Cardiovascular;  Laterality: N/A;   CARDIAC CATHETERIZATION N/A 05/11/2016   Procedure: Intravascular Pressure Wire/FFR Study;  Surgeon: Belva Crome, MD;  Location: [facility];  Service: Cardiovascular;  Laterality: N/A;   CORONARY ANGIOPLASTY WITH STENT PLACEMENT     2 stents   CORONARY ARTERY BYPASS GRAFT N/A 05/17/2016   Procedure: CORONARY ARTERY BYPASS GRAFTING (CABG) x 6 using left mammory artery and  right greater saphenous vein harvested endoscopically. [place] to LAD, SVG to Diagonal, SVG Sequential to OM2 and ramus intermediate, SVG Sequential to RCA and PDA;  Surgeon: [name], MD;  Location: [facility];  Service: Open Heart Surgery;  Laterality: N/A;   KNEE ARTHROSCOPY     right knee   LUMBAR LAMINECTOMY/DECOMPRESSION MICRODISCECTOMY Bilateral 04/10/2015   Procedure: LUMBAR LAMINECTOMY/DECOMPRESSION MICRODISCECTOMY 2 LEVELS;  Surgeon: Ashok Pall, MD;  Location: [facility];  Service: Neurosurgery;  Laterality: Bilateral;  Bilateral L45 L5S1 Laminectomy and Foraminotomy   ROTATOR CUFF REPAIR Right    SKIN CANCER EXCISION     melanoma on scalp   TEE WITHOUT CARDIOVERSION N/A 05/17/2016   Procedure: TRANSESOPHAGEAL ECHOCARDIOGRAM (TEE);  Surgeon: [name], MD;  Location: [facility];  Service: Open Heart Surgery;  Laterality: N/A;   TONSILLECTOMY AND ADENOIDECTOMY     WISDOM TOOTH EXTRACTION      Social History:  reports that he has quit smoking. His smoking use included cigarettes. He started smoking about 38 years ago. He has a 60.00 pack-year smoking history. He has never used smokeless tobacco. He reports current alcohol use. He reports that he does not use drugs. Family History:  Family History  Problem Relation Age of Onset   Heart disease Father    Emphysema Father    Hypertension Father    Cancer Father        lung/ healthy   Heart disease Mother        Deceased   Cancer Mother        breast   Hypertension Mother    Cancer Maternal Grandmother        colon   Diabetes Brother        type 2   Heart disease Brother    Pulmonary embolism Son        vasculiti   Depression Paternal Grandmother    Depression Paternal Aunt      HOME MEDICATIONS: Allergies as of 03/01/2022   No Known Allergies      Medication List        Accurate as of March 01, 2022  9:07 AM. If you have any questions, ask your nurse or doctor.          aspirin 81 MG tablet Take 81 mg  by mouth daily.   atorvastatin 80 MG tablet Commonly known as: LIPITOR TAKE 1 TABLET BY MOUTH DAILY AT 6 PM.   dapagliflozin propanediol 5 MG Tabs tablet Commonly known as: Farxiga Take 1 tablet (5 mg total) by mouth daily before breakfast.   fluticasone 50 MCG/ACT nasal spray Commonly known as: FLONASE Place 2 sprays into both nostrils daily.   glipiZIDE 5 MG tablet Commonly known as: GLUCOTROL Take 1 tablet (5 mg total) by mouth 2 (two) times daily before  a meal. TAKE 1 TABLET EVERY MORNING BEFORE BREAKFAST AND TAKE 1/2 TABLET IN THE IN THE EVENING   levothyroxine 112 MCG tablet Commonly known as: SYNTHROID Take 1 tablet (112 mcg total) by mouth daily.   metFORMIN 500 MG 24 hr tablet Commonly known as: GLUCOPHAGE-XR Take 1 tablet (500 mg total) by mouth in the morning and at bedtime.   OneTouch Delica Plus [id number] Misc Use to check blood sugar once a day and as needed.  Dx code: E11.8   OneTouch Verio Flex System w/Device Kit USE TO CHECK BLOOD SUGAR ONCE A DAY AND AS NEEDED. DX CODE: E11.8   OneTouch Verio test strip Generic drug: glucose blood USE TO CHECK BLOOD SUGAR ONCE A DAY AND AS NEEDED. DX CODE: E11.8   pantoprazole 40 MG tablet Commonly known as: PROTONIX Take 1 tablet (40 mg total) by mouth daily.         ALLERGIES: No Known Allergies      OBJECTIVE:   VITAL SIGNS: BP 140/76 (BP Location: Left Arm, Patient Position: Sitting, Cuff Size: Small)    Pulse 66    Ht 6' (1.829 m)    Wt 200 lb 9.6 oz (91 kg)    BMI 27.21 kg/m    PHYSICAL EXAM:  General: Pt appears well and is in NAD  Neck: General: Supple without adenopathy or carotid bruits. Thyroid: Thyroid size normal.  No goiter or nodules appreciated.   Lungs: Clear with good BS bilat with no rales, rhonchi, or wheezes  Heart: RRR with normal S1 and S2 and no gallops; no murmurs; no rub  Abdomen: Normoactive bowel sounds, soft, nontender, without masses or organomegaly palpable  Extremities:   Lower extremities - No pretibial edema. No lesions.  Neuro: MS is good with appropriate affect, pt is alert and Ox3     DM Foot Exam 03/01/2022   The skin of the feet is intact without sores or ulcerations. The pedal pulses are undetectable  The sensation is decreased  to a screening 5.07, 10 gram monofilament bilaterally   DATA REVIEWED:  Lab Results  Component Value Date   HGBA1C 7.1 (A) 03/01/2022   HGBA1C 7.9 (H) 07/08/2021   HGBA1C 7.8 (H) 04/02/2021   Lab Results  Component Value Date   MICROALBUR <0.7 07/16/2021   LDLCALC 27 07/16/2021   CREATININE 1.42 07/16/2021   Lab Results  Component Value Date   MICRALBCREAT 0.4 07/16/2021    Lab Results  Component Value Date   CHOL 89 07/16/2021   HDL 38.10 (L) 07/16/2021   LDLCALC 27 07/16/2021   LDLDIRECT 43.0 04/11/2016   TRIG 118.0 07/16/2021   CHOLHDL 2 07/16/2021        Latest Reference Range & Units 03/01/22 09:26  Sodium 135 - 145 mEq/L 137  Potassium 3.5 - 5.1 mEq/L 4.6  Chloride 96 - 112 mEq/L 102  CO2 19 - 32 mEq/L 26  Glucose 70 - 99 mg/dL 157 (H)  BUN 6 - 23 mg/dL 20  Creatinine 0.40 - 1.50 mg/dL 1.40  Calcium 8.4 - 10.5 mg/dL 9.4    Latest Reference Range & Units 03/01/22 09:26  GFR >60.00 mL/min 47.89 (L)  (L): Data is abnormally low   ASSESSMENT / PLAN / RECOMMENDATIONS:   1) Type 2 Diabetes Mellitus, Sub-Optimally controlled, With CKD III, Neuropathic, retinopathic and macrovascular complications - Most recent A1c of 7.1 %. Goal A1c < 7.5%.    -I have praised the patient on improved glycemic control, A1c down from 7.9%  to 7.1% -BMP shows stable GFR -No changes -Limited glycemic agents due to CKD III -He has been approved through patient assistance program to receive [place]  MEDICATIONS: Continue Farxiga 5 mg 1 tablet daily Continue glipizide 5 mg, 1 tablet before breakfast and supper Continue metformin 500 mg 1 tablet twice a day  EDUCATION / INSTRUCTIONS: BG monitoring  instructions: Patient is instructed to check his blood sugars 1 times a day, fasting. Call [facility] if: BG persistently < 70  I reviewed the Rule of 15 for the treatment of hypoglycemia in detail with the patient. Literature supplied.   2) Diabetic complications:  Eye: Does  have known diabetic retinopathy.  Neuro/ Feet: Does  have known diabetic peripheral neuropathy. Renal: Patient does  have known baseline CKD. He is not on an ACEI/ARB at present.    Follow-up in 6 months    Signed electronically by: Mack Guise, MD  [facility]  [facility] [address] [place], [place] [address] Phone: [phone] FAX: [phone]   CC: Mosie Lukes, [address] [place] [address] Phone: [phone]  Fax: [phone]    Return to Endocrinology clinic as below: No future appointments.

## 2022-03-01 NOTE — Patient Instructions (Signed)
Continue Farxiga 5 mg, 1 tablet in the morning  Continue Glipizide 5 mg 1 tablet Before Breakfast and Supper  Continue Metformin 500 mg ,1 tablet twice a day   HOW TO TREAT LOW BLOOD SUGARS (Blood sugar LESS THAN 70 MG/DL) Please follow the RULE OF 15 for the treatment of hypoglycemia treatment (when your (blood sugars are less than 70 mg/dL)   STEP 1: Take 15 grams of carbohydrates when your blood sugar is low, which includes:  3-4 GLUCOSE TABS  OR 3-4 OZ OF JUICE OR REGULAR SODA OR ONE TUBE OF GLUCOSE GEL    STEP 2: RECHECK blood sugar in 15 MINUTES STEP 3: If your blood sugar is still low at the 15 minute recheck --> then, go back to STEP 1 and treat AGAIN with another 15 grams of carbohydrates. 

## 2022-03-15 ENCOUNTER — Encounter (HOSPITAL_BASED_OUTPATIENT_CLINIC_OR_DEPARTMENT_OTHER): Payer: Self-pay

## 2022-03-15 ENCOUNTER — Emergency Department (HOSPITAL_BASED_OUTPATIENT_CLINIC_OR_DEPARTMENT_OTHER)
Admission: EM | Admit: 2022-03-15 | Discharge: 2022-03-15 | Disposition: A | Discharge status: Home / Self Care | Payer: Medicare Other | Attending: Emergency Medicine | Admitting: Emergency Medicine

## 2022-03-15 ENCOUNTER — Emergency Department (HOSPITAL_BASED_OUTPATIENT_CLINIC_OR_DEPARTMENT_OTHER): Payer: Medicare Other

## 2022-03-15 ENCOUNTER — Other Ambulatory Visit: Payer: Self-pay

## 2022-03-15 ENCOUNTER — Other Ambulatory Visit: Payer: Self-pay | Admitting: Gastroenterology

## 2022-03-15 DIAGNOSIS — Z79899 Other long term (current) drug therapy: Secondary | ICD-10-CM | POA: Diagnosis not present

## 2022-03-15 DIAGNOSIS — K219 Gastro-esophageal reflux disease without esophagitis: Secondary | ICD-10-CM | POA: Diagnosis not present

## 2022-03-15 DIAGNOSIS — R9431 Abnormal electrocardiogram [ECG] [EKG]: Secondary | ICD-10-CM | POA: Diagnosis not present

## 2022-03-15 DIAGNOSIS — K5730 Diverticulosis of large intestine without perforation or abscess without bleeding: Secondary | ICD-10-CM | POA: Diagnosis not present

## 2022-03-15 DIAGNOSIS — Z7984 Long term (current) use of oral hypoglycemic drugs: Secondary | ICD-10-CM | POA: Insufficient documentation

## 2022-03-15 DIAGNOSIS — I129 Hypertensive chronic kidney disease with stage 1 through stage 4 chronic kidney disease, or unspecified chronic kidney disease: Secondary | ICD-10-CM | POA: Insufficient documentation

## 2022-03-15 DIAGNOSIS — Z951 Presence of aortocoronary bypass graft: Secondary | ICD-10-CM | POA: Insufficient documentation

## 2022-03-15 DIAGNOSIS — N183 Chronic kidney disease, stage 3 unspecified: Secondary | ICD-10-CM | POA: Diagnosis not present

## 2022-03-15 DIAGNOSIS — R1319 Other dysphagia: Secondary | ICD-10-CM

## 2022-03-15 DIAGNOSIS — Z7982 Long term (current) use of aspirin: Secondary | ICD-10-CM | POA: Diagnosis not present

## 2022-03-15 DIAGNOSIS — R1032 Left lower quadrant pain: Secondary | ICD-10-CM

## 2022-03-15 DIAGNOSIS — E1122 Type 2 diabetes mellitus with diabetic chronic kidney disease: Secondary | ICD-10-CM | POA: Diagnosis not present

## 2022-03-15 DIAGNOSIS — K8020 Calculus of gallbladder without cholecystitis without obstruction: Secondary | ICD-10-CM | POA: Diagnosis not present

## 2022-03-15 LAB — URINALYSIS, ROUTINE W REFLEX MICROSCOPIC
Bilirubin Urine: NEGATIVE
Glucose, UA: >=500 mg/dL — AB
Hgb urine dipstick: NEGATIVE
Ketones, ur: NEGATIVE mg/dL
Leukocytes,Ua: NEGATIVE
Nitrite: NEGATIVE
Protein, ur: NEGATIVE mg/dL
Specific Gravity, Urine: 1.01 (ref 1.005–1.030)
pH: 5 (ref 5.0–8.0)

## 2022-03-15 LAB — CBC WITH DIFFERENTIAL/PLATELET
Abs Immature Granulocytes: 0.03 10*3/uL (ref 0.00–0.07)
Basophils Absolute: 0.1 10*3/uL (ref 0.0–0.1)
Basophils Relative: 1 %
Eosinophils Absolute: 0.3 10*3/uL (ref 0.0–0.5)
Eosinophils Relative: 3 %
HCT: 45.3 % (ref 39.0–52.0)
Hemoglobin: 14.6 g/dL (ref 13.0–17.0)
Immature Granulocytes: 0 %
Lymphocytes Relative: 14 %
Lymphs Abs: 1.3 10*3/uL (ref 0.7–4.0)
MCH: 26.8 pg (ref 26.0–34.0)
MCHC: 32.2 g/dL (ref 30.0–36.0)
MCV: 83.3 fL (ref 80.0–100.0)
Monocytes Absolute: 1 10*3/uL (ref 0.1–1.0)
Monocytes Relative: 11 %
Neutro Abs: 6.8 10*3/uL (ref 1.7–7.7)
Neutrophils Relative %: 71 %
Platelets: 233 10*3/uL (ref 150–400)
RBC: 5.44 MIL/uL (ref 4.22–5.81)
RDW: 13.2 % (ref 11.5–15.5)
WBC: 9.5 10*3/uL (ref 4.0–10.5)
nRBC: 0 % (ref 0.0–0.2)

## 2022-03-15 LAB — I-STAT CHEM 8, ED
BUN: 21 mg/dL (ref 8–23)
Calcium, Ion: 1.25 mmol/L (ref 1.15–1.40)
Chloride: 103 mmol/L (ref 98–111)
Creatinine, Ser: 1.6 mg/dL — ABNORMAL HIGH (ref 0.61–1.24)
Glucose, Bld: 158 mg/dL — ABNORMAL HIGH (ref 70–99)
HCT: 46 % (ref 39.0–52.0)
Hemoglobin: 15.6 g/dL (ref 13.0–17.0)
Potassium: 4.5 mmol/L (ref 3.5–5.1)
Sodium: 137 mmol/L (ref 135–145)
TCO2: 24 mmol/L (ref 22–32)

## 2022-03-15 LAB — COMPREHENSIVE METABOLIC PANEL
ALT: 15 U/L (ref 0–44)
AST: 19 U/L (ref 15–41)
Albumin: 4.4 g/dL (ref 3.5–5.0)
Alkaline Phosphatase: 121 U/L (ref 38–126)
Anion gap: 10 (ref 5–15)
BUN: 21 mg/dL (ref 8–23)
CO2: 23 mmol/L (ref 22–32)
Calcium: 10 mg/dL (ref 8.9–10.3)
Chloride: 103 mmol/L (ref 98–111)
Creatinine, Ser: 1.44 mg/dL — ABNORMAL HIGH (ref 0.61–1.24)
GFR, Estimated: 49 mL/min — ABNORMAL LOW (ref >60)
Glucose, Bld: 157 mg/dL — ABNORMAL HIGH (ref 70–99)
Potassium: 4.4 mmol/L (ref 3.5–5.1)
Sodium: 136 mmol/L (ref 135–145)
Total Bilirubin: 0.8 mg/dL (ref 0.3–1.2)
Total Protein: 7.5 g/dL (ref 6.5–8.1)

## 2022-03-15 LAB — LIPASE, BLOOD: Lipase: 40 U/L (ref 11–51)

## 2022-03-15 LAB — TROPONIN I (HIGH SENSITIVITY): Troponin I (High Sensitivity): 4 ng/L (ref <18)

## 2022-03-15 LAB — URINALYSIS, MICROSCOPIC (REFLEX)

## 2022-03-15 MED ORDER — IOHEXOL 300 MG/ML  SOLN
100.0000 mL | Freq: Once | INTRAMUSCULAR | Status: AC | PRN
  Administered 2022-03-15: 80 mL via INTRAVENOUS

## 2022-03-15 MED ORDER — PANTOPRAZOLE SODIUM 40 MG IV SOLR
40.0000 mg | Freq: Once | INTRAVENOUS | Status: AC
  Administered 2022-03-15: 40 mg via INTRAVENOUS
  Filled 2022-03-15: qty 10

## 2022-03-15 MED ORDER — SODIUM CHLORIDE 0.9 % IV BOLUS
500.0000 mL | Freq: Once | INTRAVENOUS | Status: AC
  Administered 2022-03-15: 500 mL via INTRAVENOUS

## 2022-03-15 MED ORDER — ONDANSETRON HCL 4 MG/2ML IJ SOLN
4.0000 mg | Freq: Once | INTRAMUSCULAR | Status: AC
  Administered 2022-03-15: 4 mg via INTRAVENOUS
  Filled 2022-03-15: qty 2

## 2022-03-15 MED ORDER — PANTOPRAZOLE SODIUM 40 MG PO TBEC: 40.0000 mg | DELAYED_RELEASE_TABLET | Freq: Every day | ORAL | 0 refills | Status: DC

## 2022-03-15 NOTE — ED Triage Notes (Signed)
C/o LLQ tenderness since Friday night. Last BM Sunday. States reflux when lying flat, nausea yesterday.  ?

## 2022-03-15 NOTE — Discharge Instructions (Addendum)
You should have 1 remaining refill left on your pantoprazole at [facility] on [address] at [facility]. ? ?Recheck with your doctor this week, return to the ER for fevers, worsening pain or other concerning symptoms.  ?

## 2022-03-15 NOTE — ED Provider Notes (Signed)
[name]ENC[facility] DEPARTMENT ?Provider Note ? ? ?CSN: [id number] ?Arrival date & time: 03/15/22  4128 ? ?  ? ?History ? ?Chief Complaint  ?Patient presents with  ? Abdominal Pain  ? ? ?Jack Wade is a 81 y.o. male. ? ?81 year old male with past medical history of diabetes, GERD, hypertension, hyperlipidemia presents with complaint of left lower quadrant abdominal pain x4 days.  Pain is dull in nature, does not radiate, nothing makes his pain better or worse, is intermittent.  Last bowel movement was on Sunday, nonbloody, states that Jack Wade does not have bowel movements daily.  Reports associated nausea without vomiting.  Denies fevers or chills, changes in bladder habits.  States that Jack Wade also has this indigestion sort of feeling with his nausea and that Jack Wade ran out of his Protonix last week. ?Prior abdominal surgery includes appendectomy.  States Jack Wade is up-to-date with his colonoscopy, no history of diverticula or diverticulitis. ? ? ?  ? ?Home Medications ?Prior to Admission medications   ?Medication Sig Start Date End Date Taking? Authorizing Provider  ?aspirin 81 MG tablet Take 81 mg by mouth daily.    [provider]  ?atorvastatin (LIPITOR) 80 MG tablet TAKE 1 TABLET BY MOUTH DAILY AT 6 PM. 02/07/22   Mosie Lukes, MD  ?Blood Glucose Monitoring Suppl ([place]) w/Device KIT USE TO CHECK BLOOD SUGAR ONCE A DAY AND AS NEEDED. DX CODE: E11.8 05/17/21   Mosie Lukes, MD  ?dapagliflozin propanediol (FARXIGA) 5 MG TABS tablet Take 1 tablet (5 mg total) by mouth daily before breakfast. 10/19/21   Shamleffer, Melanie Crazier, MD  ?fluticasone (FLONASE) 50 MCG/ACT nasal spray Place 2 sprays into both nostrils daily. 01/27/22 01/27/23  Terrilyn Saver, NP  ?glipiZIDE (GLUCOTROL) 5 MG tablet Take 1 tablet (5 mg total) by mouth 2 (two) times daily before a meal. TAKE 1 TABLET EVERY MORNING BEFORE BREAKFAST AND TAKE 1/2 TABLET IN THE IN THE EVENING 10/19/21   Shamleffer, Melanie Crazier, MD  ?Lancets (ONETOUCH DELICA PLUS [id number]) MISC Use to check blood sugar once a day and as needed.  Dx code: E11.8 04/06/21   Mosie Lukes, MD  ?levothyroxine (SYNTHROID) 112 MCG tablet Take 1 tablet (112 mcg total) by mouth daily. 10/20/21   Shamleffer, Melanie Crazier, MD  ?metFORMIN (GLUCOPHAGE-XR) 500 MG 24 hr tablet Take 1 tablet (500 mg total) by mouth in the morning and at bedtime. 10/19/21   Shamleffer, Melanie Crazier, MD  ?Glory [place] VERIO test strip USE TO CHECK BLOOD SUGAR ONCE A DAY AND AS NEEDED. DX CODE: E11.8 10/13/21   Mosie Lukes, MD  ?pantoprazole (PROTONIX) 40 MG tablet Take 1 tablet (40 mg total) by mouth daily. 03/15/22   Tacy Learn, PA-C  ?   ? ?Allergies    ?Patient has no known allergies.   ? ?Review of Systems   ?Review of Systems ?Negative except as per HPI ?Physical Exam ?Updated Vital Signs ?BP 121/72   Pulse (!) 59   Temp 97.7 ?F (36.5 ?C) (Oral)   Resp 18   Ht 6' (1.829 m)   Wt 86.6 kg   SpO2 100%   BMI 25.90 kg/m?  ?Physical Exam ?Vitals and nursing note reviewed.  ?Constitutional:   ?   General: Jack Wade is not in acute distress. ?   Appearance: Jack Wade is well-developed. Jack Wade is not diaphoretic.  ?HENT:  ?   Head: Normocephalic and atraumatic.  ?Cardiovascular:  ?   Rate and Rhythm:  Normal rate and regular rhythm.  ?   Heart sounds: Normal heart sounds.  ?Pulmonary:  ?   Effort: Pulmonary effort is normal.  ?   Breath sounds: Normal breath sounds.  ?Abdominal:  ?   General: Bowel sounds are normal.  ?   Palpations: Abdomen is soft.  ?   Tenderness: There is abdominal tenderness in the left lower quadrant. There is no right CVA tenderness or left CVA tenderness.  ?Skin: ?   General: Skin is warm and dry.  ?   Findings: No erythema or rash.  ?Neurological:  ?   Mental Status: Jack Wade is alert and oriented to person, place, and time.  ?Psychiatric:     ?   Behavior: Behavior normal.  ? ? ?ED Results / Procedures / Treatments   ?Labs ?(all labs ordered are listed, but only  abnormal results are displayed) ?Labs Reviewed  ?COMPREHENSIVE METABOLIC PANEL - Abnormal; Notable for the following components:  ?    Result Value  ? Glucose, Bld 157 (*)   ? Creatinine, Ser 1.44 (*)   ? GFR, Estimated 49 (*)   ? All other components within normal limits  ?URINALYSIS, ROUTINE W REFLEX MICROSCOPIC - Abnormal; Notable for the following components:  ? Glucose, UA >=500 (*)   ? All other components within normal limits  ?URINALYSIS, MICROSCOPIC (REFLEX) - Abnormal; Notable for the following components:  ? Bacteria, UA RARE (*)   ? All other components within normal limits  ?I-STAT CHEM 8, ED - Abnormal; Notable for the following components:  ? Creatinine, Ser 1.60 (*)   ? Glucose, Bld 158 (*)   ? All other components within normal limits  ?CBC WITH DIFFERENTIAL/PLATELET  ?LIPASE, BLOOD  ?TROPONIN I (HIGH SENSITIVITY)  ? ? ?EKG ?EKG Interpretation ? ?Date/Time:  Tuesday March 15 2022 12:41:44 EDT ?Ventricular Rate:  61 ?PR Interval:  245 ?QRS Duration: 141 ?QT Interval:  439 ?QTC Calculation: 443 ?R Axis:   78 ?Text Interpretation: Sinus rhythm Ventricular premature complex Prolonged PR interval Probable left atrial enlargement Right bundle branch block No significant change since last tracing Confirmed by Gareth Morgan [phone]) on 03/15/2022 2:27:48 PM ? ?Radiology ?CT Abdomen Pelvis W Contrast ? ?Result Date: 03/15/2022 ?CLINICAL DATA:  81 year old male with history of left-sided abdominal pain for the past several days. EXAM: CT ABDOMEN AND PELVIS WITH CONTRAST TECHNIQUE: Multidetector CT imaging of the abdomen and pelvis was performed using the standard protocol following bolus administration of intravenous contrast. RADIATION DOSE REDUCTION: This exam was performed according to the departmental dose-optimization program which includes automated exposure control, adjustment of the mA and/or kV according to patient size and/or use of iterative reconstruction technique. CONTRAST:  33m OMNIPAQUE  IOHEXOL 300 MG/ML  SOLN COMPARISON:  CT of the abdomen and pelvis 10/01/2016. FINDINGS: Lower chest: Atherosclerotic calcifications in the descending thoracic aorta as well as the right coronary artery. Small hiatal hernia. Hepatobiliary: No suspicious cystic or solid hepatic lesions. No intra or extrahepatic biliary ductal dilatation. Calcified gallstone measuring 5 mm in the gallbladder. No findings to suggest an acute cholecystitis are noted at this time. Pancreas: No pancreatic mass. No pancreatic ductal dilatation. No pancreatic or peripancreatic fluid collections or inflammatory changes. Spleen: Unremarkable. Adrenals/Urinary Tract: Bilateral kidneys and adrenal glands are normal in appearance. No hydroureteronephrosis. Urinary bladder is unremarkable in appearance. Stomach/Bowel: Normal appearance of the stomach. No pathologic dilatation of small bowel or colon. A few scattered colonic diverticulae are noted, without surrounding inflammatory changes to suggest an  acute diverticulitis at this time. The appendix is not confidently identified and may be surgically absent. Regardless, there are no inflammatory changes noted adjacent to the cecum to suggest the presence of an acute appendicitis at this time. Vascular/Lymphatic: Aortic atherosclerosis, without evidence of aneurysm or dissection in the abdominal or pelvic vasculature. No lymphadenopathy noted in the abdomen or pelvis. Reproductive: Prostate gland and seminal vesicles are unremarkable in appearance. Other: No significant volume of ascites.  No pneumoperitoneum. Musculoskeletal: There are no aggressive appearing lytic or blastic lesions noted in the visualized portions of the skeleton. IMPRESSION: 1. There are no acute findings noted in the abdomen or pelvis to account for the patient's symptoms. 2. Cholelithiasis without evidence of acute cholecystitis at this time. 3. Mild colonic diverticulosis without evidence of acute diverticulitis at this time.  4. Aortic atherosclerosis. 5. Small hiatal hernia. Electronically Signed   By: Vinnie Langton M.D.   On: 03/15/2022 10:21   ? ?Procedures ?Procedures  ? ? ?Medications Ordered in ED ?Medications  ?ondansetron (ZOFRAN)

## 2022-04-22 ENCOUNTER — Other Ambulatory Visit: Payer: Self-pay | Admitting: Family Medicine

## 2022-04-23 ENCOUNTER — Other Ambulatory Visit: Payer: Self-pay | Admitting: Internal Medicine

## 2022-04-23 DIAGNOSIS — E118 Type 2 diabetes mellitus with unspecified complications: Secondary | ICD-10-CM

## 2022-06-09 NOTE — Progress Notes (Signed)
HPI: FU coronary artery disease. Lower extremity Dopplers February 2016 showed an occluded right anterior tibial artery. Abdominal ultrasound February 2016 showed no aneurysm. Patient had cardiac catheterization May 2017 secondary to continued exertional chest pain. He was found to have severe three-vessel coronary artery disease and normal LV function. Echocardiogram showed normal LV systolic function, grade 1 diastolic dysfunction and mildly dilated aortic root. Note carotid Dopplers May 2017 Prior to surgery showed 1-39% bilateral stenosis. Patient had coronary artery bypass and graft with LIMA to the LAD, saphenous vein graft to second diagonal, sequential saphenous vein graft to the intermediate and distal circumflex, and sequential saphenous vein graft to the right coronary artery and PDA. Since he was last seen, he does not see an increase in dyspnea with activities but is still able to well.  He denies recurrent chest pain.  No orthopnea, PND or pedal edema.  No syncope.  Current Outpatient Medications  Medication Sig Dispense Refill   aspirin 81 MG tablet Take 81 mg by mouth daily.     atorvastatin (LIPITOR) 80 MG tablet TAKE 1 TABLET BY MOUTH DAILY AT 6 PM. 90 tablet 1   Blood Glucose Monitoring Suppl ([place]) w/Device KIT USE TO CHECK BLOOD SUGAR ONCE A DAY AND AS NEEDED. DX CODE: E11.8 1 kit 0   dapagliflozin propanediol (FARXIGA) 5 MG TABS tablet Take 1 tablet (5 mg total) by mouth daily before breakfast. 30 tablet 6   glipiZIDE (GLUCOTROL) 5 MG tablet TAKE 1 TABLET (5 MG TOTAL) BY MOUTH 2 (TWO) TIMES DAILY BEFORE A MEAL. TAKE 1 TABLET EVERY MORNING BEFORE BREAKFAST AND TAKE 1/2 TABLET IN THE IN THE EVENING 180 tablet 1   Lancets (ONETOUCH DELICA PLUS [id number]) MISC Use to check blood sugar once a day and as needed.  Dx code: E11.8 100 each 1   levothyroxine (SYNTHROID) 112 MCG tablet Take 1 tablet (112 mcg total) by mouth daily. 90 tablet 3   metFORMIN  (GLUCOPHAGE-XR) 500 MG 24 hr tablet TAKE 1 TABLET (500 MG TOTAL) BY MOUTH IN THE MORNING AND AT BEDTIME. 180 tablet 1   ONETOUCH VERIO test strip USE TO CHECK BLOOD SUGAR ONCE A DAY AND AS NEEDED. DX CODE: E11.8 100 strip 1   Current Facility-Administered Medications  Medication Dose Route Frequency Provider Last Rate Last Admin   0.9 %  sodium chloride infusion  500 mL Intravenous Once Jackquline Denmark, MD         Past Medical History:  Diagnosis Date   Aortic calcification ([place]) 02/09/2015   Appendicitis    Back pain 02/09/2015   CAD (coronary artery disease)    2 stents   Cataract    bilateral   Chicken pox    Diabetes mellitus type 2 in obese ([place]) 04/11/2016   Diabetes type 2, controlled ([place])    Dysphagia, unspecified(787.20) 03/20/2014   Hereditary and idiopathic peripheral neuropathy 02/09/2015   Hyperlipidemia    Hypertension    Hypothyroidism    Light headedness 07/04/2017   Measles    Medicare annual wellness visit, subsequent 06/14/2015   Melanoma ([place])    Scalp. 2012   Mumps as a child   Thyroid disease    [place] Hyperthyroidism    Past Surgical History:  Procedure Laterality Date   APPENDECTOMY     BACK SURGERY     x2 lumbar   BACK SURGERY     late 1990s had 2 surgeries, first surgery lifted a heavy engine ruptured  disds, at L4 and L5, cleaned discs no hardware very helpful. 2 years later while recovering from angiogram with weights applied to left leg caused a recurrence and required surgery again in same area with disc repair   [place] N/A 05/11/2016   Procedure: Left Heart Cath and Coronary Angiography;  Surgeon: Belva Crome, MD;  Location: [facility];  Service: Cardiovascular;  Laterality: N/A;   CARDIAC CATHETERIZATION N/A 05/11/2016   Procedure: Intravascular Pressure Wire/FFR Study;  Surgeon: Belva Crome, MD;  Location: [facility];  Service: Cardiovascular;  Laterality: N/A;   CORONARY  ANGIOPLASTY WITH STENT PLACEMENT     2 stents   CORONARY ARTERY BYPASS GRAFT N/A 05/17/2016   Procedure: CORONARY ARTERY BYPASS GRAFTING (CABG) x 6 using left mammory artery and right greater saphenous vein harvested endoscopically. [place] to LAD, SVG to Diagonal, SVG Sequential to OM2 and ramus intermediate, SVG Sequential to RCA and PDA;  Surgeon: [name], MD;  Location: [facility];  Service: Open Heart Surgery;  Laterality: N/A;   KNEE ARTHROSCOPY     right knee   LUMBAR LAMINECTOMY/DECOMPRESSION MICRODISCECTOMY Bilateral 04/10/2015   Procedure: LUMBAR LAMINECTOMY/DECOMPRESSION MICRODISCECTOMY 2 LEVELS;  Surgeon: Ashok Pall, MD;  Location: [facility];  Service: Neurosurgery;  Laterality: Bilateral;  Bilateral L45 L5S1 Laminectomy and Foraminotomy   ROTATOR CUFF REPAIR Right    SKIN CANCER EXCISION     melanoma on scalp   TEE WITHOUT CARDIOVERSION N/A 05/17/2016   Procedure: TRANSESOPHAGEAL ECHOCARDIOGRAM (TEE);  Surgeon: [name], MD;  Location: [facility];  Service: Open Heart Surgery;  Laterality: N/A;   TONSILLECTOMY AND ADENOIDECTOMY     WISDOM TOOTH EXTRACTION      Social History   Socioeconomic History   Marital status: Married    Spouse name: Not on file   Number of children: 2   Years of education: Not on file   Highest education level: Not on file  Occupational History   Not on file  Tobacco Use   Smoking status: Former    Packs/day: 2.00    Years: 30.00    Total pack years: 60.00    Types: Cigarettes    Start date: 12/27/1983   Smokeless tobacco: Never  Vaping Use   Vaping Use: Never used  Substance and Sexual Activity   Alcohol use: Yes    Alcohol/week: 0.0 standard drinks of alcohol    Comment: occasionally   Drug use: No   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   Other Topics Concern   Not on file  Social History Narrative   Not on file   Social Determinants of Health   Financial  Resource Strain: Not on file  Food Insecurity: Not on file  Transportation Needs: Not on file  Physical Activity: Not on file  Stress: Not on file  Social Connections: Not on file  Intimate Partner Violence: Not on file    Family History  Problem Relation Age of Onset   Heart disease Father    Emphysema Father    Hypertension Father    Cancer Father        lung/ healthy   Heart disease Mother        Deceased   Cancer Mother        breast   Hypertension Mother    Cancer Maternal Grandmother        colon  Diabetes Brother        type 2   Heart disease Brother    Pulmonary embolism Son        vasculiti   Depression Paternal Grandmother    Depression Paternal Aunt     ROS: no fevers or chills, productive cough, hemoptysis, dysphasia, odynophagia, melena, hematochezia, dysuria, hematuria, rash, seizure activity, orthopnea, PND, pedal edema, claudication. Remaining systems are negative.  Physical Exam: Well-developed well-nourished in no acute distress.  Skin is warm and dry.  HEENT is normal.  Neck is supple.  Chest is clear to auscultation with normal expansion.  Cardiovascular exam is regular rate and rhythm.  Abdominal exam nontender or distended. No masses palpated. Extremities show no edema. neuro grossly intact  A/P  1 coronary artery disease-patient denies chest pain.  Continue aspirin and statin.  2 hyperlipidemia-continue statin.  3 history of orthostatic hypotension-no recent symptoms  4 peripheral vascular disease-no claudication at present.  Continue aspirin and statin.  Kirk Ruths, MD

## 2022-06-22 ENCOUNTER — Encounter: Payer: Self-pay | Admitting: Cardiology

## 2022-06-22 ENCOUNTER — Ambulatory Visit (INDEPENDENT_AMBULATORY_CARE_PROVIDER_SITE_OTHER): Payer: Medicare Other | Admitting: Cardiology

## 2022-06-22 VITALS — BP 128/64 | HR 73 | Ht 72.0 in | Wt 194.0 lb

## 2022-06-22 DIAGNOSIS — E7800 Pure hypercholesterolemia, unspecified: Secondary | ICD-10-CM

## 2022-06-22 DIAGNOSIS — I2510 Atherosclerotic heart disease of native coronary artery without angina pectoris: Secondary | ICD-10-CM | POA: Diagnosis not present

## 2022-06-22 DIAGNOSIS — I739 Peripheral vascular disease, unspecified: Secondary | ICD-10-CM | POA: Diagnosis not present

## 2022-06-22 NOTE — Patient Instructions (Signed)

## 2022-07-27 ENCOUNTER — Telehealth: Payer: Self-pay

## 2022-07-27 NOTE — Telephone Encounter (Signed)
Attempted to return the patient's call regarding his appt date and time. LVM informing him of the date and time.

## 2022-08-13 ENCOUNTER — Other Ambulatory Visit: Payer: Self-pay | Admitting: Family Medicine

## 2022-08-13 DIAGNOSIS — E785 Hyperlipidemia, unspecified: Secondary | ICD-10-CM

## 2022-08-26 ENCOUNTER — Encounter: Payer: Self-pay | Admitting: Gastroenterology

## 2022-08-26 DIAGNOSIS — Z23 Encounter for immunization: Secondary | ICD-10-CM | POA: Diagnosis not present

## 2022-09-01 ENCOUNTER — Encounter: Payer: Self-pay | Admitting: Internal Medicine

## 2022-09-01 ENCOUNTER — Ambulatory Visit (INDEPENDENT_AMBULATORY_CARE_PROVIDER_SITE_OTHER): Payer: Medicare Other | Admitting: Internal Medicine

## 2022-09-01 VITALS — BP 124/68 | HR 68 | Ht 72.0 in | Wt 186.0 lb

## 2022-09-01 DIAGNOSIS — E1159 Type 2 diabetes mellitus with other circulatory complications: Secondary | ICD-10-CM | POA: Diagnosis not present

## 2022-09-01 LAB — POCT GLYCOSYLATED HEMOGLOBIN (HGB A1C): Hemoglobin A1C: 7.1 % — AB (ref 4.0–5.6)

## 2022-09-01 MED ORDER — ONETOUCH DELICA LANCETS 30G MISC: 1.0000 | Freq: Every day | 3 refills | Status: AC

## 2022-09-01 NOTE — Progress Notes (Signed)
Name: Jack Wade  MRN/ DOB: [id number], 1941-08-19   Age/ Sex: 81 y.o., male    PCP: Mosie Lukes, MD   Reason for Endocrinology Evaluation: Type 2 Diabetes Mellitus     Date of Initial Endocrinology Visit: 10/19/2021    PATIENT IDENTIFIER: [name] is a 81 y.o. male with a past medical history of T2DM, Dyslipidemia and GERD . The patient presented for initial endocrinology clinic visit on 10/19/2021 for consultative assistance with his diabetes management.    HPI: [name] was    Diagnosed with DM years ago  Prior Medications tried/Intolerance: as listed  Currently checking blood sugars occasionally  Hypoglycemia episodes : no               Hemoglobin A1c has ranged from 7.1% in 2019, peaking at 7.9% in 2022. Patient required assistance for hypoglycemia: no Patient has required hospitalization within the last 1 year from hyper or hypoglycemia: no  In terms of diet, the patient eats 3 meals, snacks in the evening . The pt drinks sugar sweetened beverages    SUBJECTIVE:   During the last visit (03/01/2022): A1c 7.1% continued glipizide and metformin and started [place]      Today (09/01/22): [name]  is here for a follow up on diabetes management . He  checks his blood sugars 1 times daily. The patient has not had hypoglycemic episodes since the last clinic visit.  He has subjective abnormal feeling during the day and he feels this may be related to hypoglycemia but no evidence of hypoglycemia on meter  Patient had a follow-up with cardiology 06/14/2022 He presented to the ED for left lower quadrant abdominal pain, no evidence of UTI, CT showed colonic diverticulosis  He is on patient assistance program for [name]   Denies nausea, vomiting but has constipation , will be seeing Dr. Lyndel Safe as well as dysphagia  Wife is upset about weight loss   The lancing device is small and he is having difficulty obtaining fingersticks He is  interested in CGM technology  HOME DIABETES REGIMEN: Glipizide 5 mg 1 tablet BID  Metformin 500 mg 1 tabs BID  Farxiga 5 mg daily - through ot assistance       Statin: yes ACE-I/ARB: no    METER DOWNLOAD SUMMARY: unable to download 107 - 207  mg/dL     DIABETIC COMPLICATIONS: Microvascular complications:  CKD III, ? retinopathy ( Right eye - stable ) , neuropathy  Denies: neuropathy  Last eye exam: Completed 08/2021  Macrovascular complications:  CAD , PVD Denies: CVA   PAST HISTORY: Past Medical History:  Past Medical History:  Diagnosis Date   Aortic calcification ([place]) 02/09/2015   Appendicitis    Back pain 02/09/2015   CAD (coronary artery disease)    2 stents   Cataract    bilateral   Chicken pox    Diabetes mellitus type 2 in obese ([place]) 04/11/2016   Diabetes type 2, controlled ([place])    Dysphagia, unspecified(787.20) 03/20/2014   Hereditary and idiopathic peripheral neuropathy 02/09/2015   Hyperlipidemia    Hypertension    Hypothyroidism    Light headedness 07/04/2017   Measles    Medicare annual wellness visit, subsequent 06/14/2015   Melanoma ([place])    Scalp. 2012   Mumps as a child   Thyroid disease    Beoming Hyperthyroidism   Past Surgical History:  Past Surgical History:  Procedure Laterality Date   APPENDECTOMY  BACK SURGERY     x2 lumbar   BACK SURGERY     late 1990s had 2 surgeries, first surgery lifted a heavy engine ruptured disds, at L4 and L5, cleaned discs no hardware very helpful. 2 years later while recovering from angiogram with weights applied to left leg caused a recurrence and required surgery again in same area with disc repair   [place] N/A 05/11/2016   Procedure: Left Heart Cath and Coronary Angiography;  Surgeon: Belva Crome, MD;  Location: [facility];  Service: Cardiovascular;  Laterality: N/A;   CARDIAC CATHETERIZATION N/A 05/11/2016   Procedure: Intravascular Pressure  Wire/FFR Study;  Surgeon: Belva Crome, MD;  Location: [facility];  Service: Cardiovascular;  Laterality: N/A;   CORONARY ANGIOPLASTY WITH STENT PLACEMENT     2 stents   CORONARY ARTERY BYPASS GRAFT N/A 05/17/2016   Procedure: CORONARY ARTERY BYPASS GRAFTING (CABG) x 6 using left mammory artery and right greater saphenous vein harvested endoscopically. [place] to LAD, SVG to Diagonal, SVG Sequential to OM2 and ramus intermediate, SVG Sequential to RCA and PDA;  Surgeon: [name], MD;  Location: [facility];  Service: Open Heart Surgery;  Laterality: N/A;   KNEE ARTHROSCOPY     right knee   LUMBAR LAMINECTOMY/DECOMPRESSION MICRODISCECTOMY Bilateral 04/10/2015   Procedure: LUMBAR LAMINECTOMY/DECOMPRESSION MICRODISCECTOMY 2 LEVELS;  Surgeon: Ashok Pall, MD;  Location: [facility];  Service: Neurosurgery;  Laterality: Bilateral;  Bilateral L45 L5S1 Laminectomy and Foraminotomy   ROTATOR CUFF REPAIR Right    SKIN CANCER EXCISION     melanoma on scalp   TEE WITHOUT CARDIOVERSION N/A 05/17/2016   Procedure: TRANSESOPHAGEAL ECHOCARDIOGRAM (TEE);  Surgeon: [name], MD;  Location: [facility];  Service: Open Heart Surgery;  Laterality: N/A;   TONSILLECTOMY AND ADENOIDECTOMY     WISDOM TOOTH EXTRACTION      Social History:  reports that he has quit smoking. His smoking use included cigarettes. He started smoking about 38 years ago. He has a 60.00 pack-year smoking history. He has never used smokeless tobacco. He reports current alcohol use. He reports that he does not use drugs. Family History:  Family History  Problem Relation Age of Onset   Heart disease Father    Emphysema Father    Hypertension Father    Cancer Father        lung/ healthy   Heart disease Mother        Deceased   Cancer Mother        breast   Hypertension Mother    Cancer Maternal Grandmother        colon   Diabetes Brother        type 2   Heart disease Brother    Pulmonary embolism Son        vasculiti    Depression Paternal Grandmother    Depression Paternal Aunt      HOME MEDICATIONS: Allergies as of 09/01/2022   No Known Allergies      Medication List        Accurate as of September 01, 2022  8:53 AM. If you have any questions, ask your nurse or doctor.          aspirin 81 MG tablet Take 81 mg by mouth daily.   atorvastatin 80 MG tablet Commonly known as: LIPITOR Take 1 tablet (80 mg total) by mouth at bedtime.   dapagliflozin propanediol 5 MG Tabs tablet  Commonly known as: Farxiga Take 1 tablet (5 mg total) by mouth daily before breakfast.   glipiZIDE 5 MG tablet Commonly known as: GLUCOTROL TAKE 1 TABLET (5 MG TOTAL) BY MOUTH 2 (TWO) TIMES DAILY BEFORE A MEAL. TAKE 1 TABLET EVERY MORNING BEFORE BREAKFAST AND TAKE 1/2 TABLET IN THE IN THE EVENING   levothyroxine 112 MCG tablet Commonly known as: SYNTHROID Take 1 tablet (112 mcg total) by mouth daily.   metFORMIN 500 MG 24 hr tablet Commonly known as: GLUCOPHAGE-XR TAKE 1 TABLET (500 MG TOTAL) BY MOUTH IN THE MORNING AND AT BEDTIME.   OneTouch Delica Plus [id number] Misc Use to check blood sugar once a day and as needed.  Dx code: E11.8   OneTouch Verio Flex System w/Device Kit USE TO CHECK BLOOD SUGAR ONCE A DAY AND AS NEEDED. DX CODE: E11.8   OneTouch Verio test strip Generic drug: glucose blood USE TO CHECK BLOOD SUGAR ONCE A DAY AND AS NEEDED. DX CODE: E11.8         ALLERGIES: No Known Allergies      OBJECTIVE:   VITAL SIGNS: BP 124/68 (BP Location: Left Arm, Patient Position: Sitting, Cuff Size: Small)   Pulse 68   Ht 6' (1.829 m)   Wt 186 lb (84.4 kg)   SpO2 99%   BMI 25.23 kg/m    PHYSICAL EXAM:  General: Pt appears well and is in NAD  Neck: General: Supple without adenopathy or carotid bruits. Thyroid: Thyroid size normal.  No goiter or nodules appreciated.   Lungs: Clear with good BS bilat with no rales, rhonchi, or wheezes  Heart: RRR   Extremities:  Lower extremities - No  pretibial edema  Neuro: MS is good with appropriate affect, pt is alert and Ox3     DM Foot Exam 03/01/2022   The skin of the feet is intact without sores or ulcerations. The pedal pulses are undetectable  The sensation is decreased  to a screening 5.07, 10 gram monofilament bilaterally   DATA REVIEWED:  Lab Results  Component Value Date   HGBA1C 7.1 (A) 09/01/2022   HGBA1C 7.1 (A) 03/01/2022   HGBA1C 7.9 (H) 07/08/2021   Lab Results  Component Value Date   MICROALBUR <0.7 07/16/2021   LDLCALC 27 07/16/2021   CREATININE 1.60 (H) 03/15/2022   Lab Results  Component Value Date   MICRALBCREAT 0.4 07/16/2021    Lab Results  Component Value Date   CHOL 89 07/16/2021   HDL 38.10 (L) 07/16/2021   LDLCALC 27 07/16/2021   LDLDIRECT 43.0 04/11/2016   TRIG 118.0 07/16/2021   CHOLHDL 2 07/16/2021        Latest Reference Range & Units 03/01/22 09:26  Sodium 135 - 145 mEq/L 137  Potassium 3.5 - 5.1 mEq/L 4.6  Chloride 96 - 112 mEq/L 102  CO2 19 - 32 mEq/L 26  Glucose 70 - 99 mg/dL 157 (H)  BUN 6 - 23 mg/dL 20  Creatinine 0.40 - 1.50 mg/dL 1.40  Calcium 8.4 - 10.5 mg/dL 9.4    Latest Reference Range & Units 03/01/22 09:26  GFR >60.00 mL/min 47.89 (L)    ASSESSMENT / PLAN / RECOMMENDATIONS:   1) Type 2 Diabetes Mellitus, Optimally controlled, With CKD III, Neuropathic, retinopathic and macrovascular complications - Most recent A1c of 7.1 %. Goal A1c < 7.5%.    -A1c stable at 7.1%  -BMP shows stable GFR -Limited glycemic agents due to CKD III -He has been approved through patient assistance program to  receive [name], he was given a new form today as he received an email stating they are missing information -We discussed that [name] will cause weight loss, I did discuss the cardiovascular and the renal benefits with Farxiga versus the weight loss, his BMI is normal at 25.  The patient will have to make that decision if he wants to discontinue it, if he does discontinue  [place], we will have to increase his glipizide -I have changed his lancing device from 33-gauge to 30-gauge to see if that would help with fingersticks -He is interested in CGM technology, I did explain to the patient that with basic Medicare and lack of insulin injections he would not qualify, but I did offer prescribing freestyle libre and the cost is about $75 a month but this is cost prohibitive at this time  MEDICATIONS: Continue Farxiga 5 mg 1 tablet daily Continue glipizide 5 mg, 1 tablet before breakfast and supper Continue metformin 500 mg 1 tablet twice a day  EDUCATION / INSTRUCTIONS: BG monitoring instructions: Patient is instructed to check his blood sugars 1 times a day, fasting. Call [facility] if: BG persistently < 70  I reviewed the Rule of 15 for the treatment of hypoglycemia in detail with the patient. Literature supplied.   2) Diabetic complications:  Eye: Does  have known diabetic retinopathy.  Neuro/ Feet: Does  have known diabetic peripheral neuropathy. Renal: Patient does  have known baseline CKD. He is not on an ACEI/ARB at present.    Follow-up in 6 months  I spent 25 minutes preparing to see the patient by review of recent labs, imaging and procedures, obtaining and reviewing separately obtained history, communicating with the patient, ordering medications, and documenting clinical information in the EHR including the differential Dx, treatment, and any further evaluation and other management    Signed electronically by: Mack Guise, MD  [facility]  [address] [place], [place] [address] Phone: [phone] FAX: [phone]   CC: Mosie Lukes, MD [address] [place] [address] Phone: [phone]  Fax: [phone]    Return to Endocrinology clinic as below: Future Appointments  Date Time Provider [place]  10/20/2022  9:00 AM  Noralyn Pick, NP [facility]

## 2022-09-01 NOTE — Patient Instructions (Signed)
Continue Farxiga 5 mg, 1 tablet in the morning  Continue Glipizide 5 mg 1 tablet Before Breakfast and Supper  Continue Metformin 500 mg ,1 tablet twice a day   HOW TO TREAT LOW BLOOD SUGARS (Blood sugar LESS THAN 70 MG/DL) Please follow the RULE OF 15 for the treatment of hypoglycemia treatment (when your (blood sugars are less than 70 mg/dL)   STEP 1: Take 15 grams of carbohydrates when your blood sugar is low, which includes:  3-4 GLUCOSE TABS  OR 3-4 OZ OF JUICE OR REGULAR SODA OR ONE TUBE OF GLUCOSE GEL    STEP 2: RECHECK blood sugar in 15 MINUTES STEP 3: If your blood sugar is still low at the 15 minute recheck --> then, go back to STEP 1 and treat AGAIN with another 15 grams of carbohydrates.

## 2022-09-16 ENCOUNTER — Telehealth: Payer: Self-pay | Admitting: Family Medicine

## 2022-09-16 NOTE — Telephone Encounter (Signed)
Patient would like recommendation for a provider to treat carpal tunnel. Please call to advise.

## 2022-09-26 DIAGNOSIS — M65331 Trigger finger, right middle finger: Secondary | ICD-10-CM | POA: Diagnosis not present

## 2022-09-26 DIAGNOSIS — M79642 Pain in left hand: Secondary | ICD-10-CM | POA: Diagnosis not present

## 2022-09-29 ENCOUNTER — Ambulatory Visit: Payer: Medicare Other | Admitting: Nurse Practitioner

## 2022-10-11 DIAGNOSIS — D1801 Hemangioma of skin and subcutaneous tissue: Secondary | ICD-10-CM | POA: Diagnosis not present

## 2022-10-11 DIAGNOSIS — Z85820 Personal history of malignant melanoma of skin: Secondary | ICD-10-CM | POA: Diagnosis not present

## 2022-10-11 DIAGNOSIS — D485 Neoplasm of uncertain behavior of skin: Secondary | ICD-10-CM | POA: Diagnosis not present

## 2022-10-11 DIAGNOSIS — E663 Overweight: Secondary | ICD-10-CM | POA: Diagnosis not present

## 2022-10-11 DIAGNOSIS — L814 Other melanin hyperpigmentation: Secondary | ICD-10-CM | POA: Diagnosis not present

## 2022-10-11 DIAGNOSIS — X32XXXS Exposure to sunlight, sequela: Secondary | ICD-10-CM | POA: Diagnosis not present

## 2022-10-11 DIAGNOSIS — L820 Inflamed seborrheic keratosis: Secondary | ICD-10-CM | POA: Diagnosis not present

## 2022-10-18 DIAGNOSIS — Z961 Presence of intraocular lens: Secondary | ICD-10-CM | POA: Diagnosis not present

## 2022-10-18 DIAGNOSIS — H04123 Dry eye syndrome of bilateral lacrimal glands: Secondary | ICD-10-CM | POA: Diagnosis not present

## 2022-10-18 DIAGNOSIS — E119 Type 2 diabetes mellitus without complications: Secondary | ICD-10-CM | POA: Diagnosis not present

## 2022-10-18 DIAGNOSIS — H02055 Trichiasis without entropian left lower eyelid: Secondary | ICD-10-CM | POA: Diagnosis not present

## 2022-10-18 DIAGNOSIS — H11043 Peripheral pterygium, stationary, bilateral: Secondary | ICD-10-CM | POA: Diagnosis not present

## 2022-10-19 NOTE — Progress Notes (Unsigned)
10/19/2022 Jack Wade [id number] 01-04-1941   Chief Complaint: Difficulty swallowing, constipation   History of Present Illness: Jack Wade is a 81 year old male with a past medical history of hypertension, hyperlipidemia, coronary artery disease s/p DES x 2 s/p 6 vessel CABG 04/2016, DM type II, neuropathy, malignant melanoma 2012, dysphagia and colon polyps. He is known by Dr. Lyndel Safe.  He presents today with complaints of recurrent dysphagia and he is also experiencing constipation.  He has a history of dysphagia and underwent an EGD by Dr. Lyndel Safe 04/30/2021 which showed a benign esophageal stenosis which was dilated, a 2 cm hiatal hernia and presbyesophagus.  Distal esophageal biopsy showed reflux without evidence of Barrett's esophagus.  He took Pantoprazole 40 mg once daily for a few months then stopped taking it as he did not know he had further refills.  He stated his dysphagia symptoms improved for 6 to 8 months post EGD with esophageal dilatation then gradually recurred.  He describes having difficulty swallowing bulky foods such as meat or shredded wheat which gets hung in his throat or upper esophagus then triggers a response after few bites which includes feeling foggy in his head like he might pass out but does not.  He drinks a few sips of water and the stuck food passes down.  No heartburn or upper abdominal pain.  He underwent a barium swallow study 03/30/2012 which was normal.  He takes ASA 81 mg daily.  No other NSAIDs.  No alcohol use.  He complains of having constipation.  He can go 1 week without passing a bowel movement.  No lower abdominal pain.  He drinks warm prune juice and takes 2 senna laxative tablets when he feels desperate which results in emptying out a large amount of stool.  No rectal bleeding or black stools.  He denies taking any fiber supplement.  His most recent colonoscopy was 10/17/2018 and 2 tubular adenomatous polyps and one hyperplastic polyp were  removed from the colon.  A few diverticula were in the sigmoid colon and ascending colon.  He was advised by Dr. Lyndel Safe to repeat a colonoscopy in 3 years.  Maternal grandmother with history of colon cancer.    History of coronary artery disease s/p DES x 2 s/p 6 vessel CABG 04/2016.  He was last seen by his cardiologist Dr. Stanford Breed 06/22/2022.  At that time, his cardiac status was stable and he was instructed to continue aspirin and a statin.     Latest Ref Rng & Units 03/15/2022    9:32 AM 03/15/2022    9:18 AM 07/16/2021    8:33 AM  CBC  WBC 4.0 - 10.5 K/uL  9.5  7.0   Hemoglobin 13.0 - 17.0 g/dL 15.6  14.6  13.9   Hematocrit 39.0 - 52.0 % 46.0  45.3  41.8   Platelets 150 - 400 K/uL  233  235.0        Latest Ref Rng & Units 03/15/2022    9:32 AM 03/15/2022    9:18 AM 03/01/2022    9:26 AM  CMP  Glucose 70 - 99 mg/dL 158  157  157   BUN 8 - 23 mg/dL '21  21  20   ' Creatinine 0.61 - 1.24 mg/dL 1.60  1.44  1.40   Sodium 135 - 145 mmol/L 137  136  137   Potassium 3.5 - 5.1 mmol/L 4.5  4.4  4.6   Chloride 98 - 111 mmol/L 103  103  102   CO2 22 - 32 mmol/L  23  26   Calcium 8.9 - 10.3 mg/dL  10.0  9.4   Total Protein 6.5 - 8.1 g/dL  7.5    Total Bilirubin 0.3 - 1.2 mg/dL  0.8    Alkaline Phos 38 - 126 U/L  121    AST 15 - 41 U/L  19    ALT 0 - 44 U/L  15       EGD 04/30/2021: - Benign-appearing esophageal stenosis. Dilated. Biopsied. - 2 cm hiatal hernia. - Presbyesophagus Surgical [P], distal esophagus - GASTROESOPHAGEAL MUCOSA WITH INFLAMMATION CONSISTENT WITH REFLUX. - NO INTESTINAL METAPLASIA, DYSPLASIA, OR CARCINOMA.  Colonoscopy 10/17/2018: A 8 mm polyp was found in the mid ascending colon. The polyp was sessile. The polyp was removed with a cold snare. Resection and retrieval were complete. Estimated blood loss: none. - Two sessile polyps were found in the descending colon. The polyps were 6 to 8 mm in size. These polyps were removed with a cold snare. Resection and retrieval  were complete. Estimated blood loss was minimal. - A few small-mouthed diverticula were found in the sigmoid colon and ascending colon. - Non-bleeding internal hemorrhoids were found during retroflexion. The hemorrhoids were small. - The exam was otherwise without abnormality on direct and retroflexion views. -Colonic polyps status post polypectomy -Mild pancolonic diverticulosis -Otherwise normal colonoscopy. - 3 year colonoscopy recall Path report: 1. Surgical [P], ascending, polyp - TUBULAR ADENOMA(S). - HIGH GRADE DYSPLASIA IS NOT IDENTIFIED. 2. Surgical [P], descending, polyp (2) - TUBULAR ADENOMA(S). - HYPERPLASTIC POLYP(S). - HIGH GRADE DYSPLASIA IS NOT IDENTIFIED  Past Medical History:  Diagnosis Date   Aortic calcification ([place]) 02/09/2015   Appendicitis    Back pain 02/09/2015   CAD (coronary artery disease)    2 stents   Cataract    bilateral   Chicken pox    Diabetes mellitus type 2 in obese ([place]) 04/11/2016   Diabetes type 2, controlled ([place])    Dysphagia, unspecified(787.20) 03/20/2014   Hereditary and idiopathic peripheral neuropathy 02/09/2015   Hyperlipidemia    Hypertension    Hypothyroidism    Light headedness 07/04/2017   Measles    Medicare annual wellness visit, subsequent 06/14/2015   Melanoma ([place])    Scalp. 2012   Mumps as a child   Thyroid disease    [place] Hyperthyroidism   Past Surgical History:  Procedure Laterality Date   APPENDECTOMY     BACK SURGERY     x2 lumbar   BACK SURGERY     late 1990s had 2 surgeries, first surgery lifted a heavy engine ruptured disds, at L4 and L5, cleaned discs no hardware very helpful. 2 years later while recovering from angiogram with weights applied to left leg caused a recurrence and required surgery again in same area with disc repair   [place] N/A 05/11/2016   Procedure: Left Heart Cath and Coronary Angiography;  Surgeon: Belva Crome, MD;  Location: [facility];  Service: Cardiovascular;  Laterality: N/A;   CARDIAC CATHETERIZATION N/A 05/11/2016   Procedure: Intravascular Pressure Wire/FFR Study;  Surgeon: Belva Crome, MD;  Location: [facility];  Service: Cardiovascular;  Laterality: N/A;   CORONARY ANGIOPLASTY WITH STENT PLACEMENT     2 stents   CORONARY ARTERY BYPASS GRAFT N/A 05/17/2016   Procedure: CORONARY ARTERY BYPASS GRAFTING (CABG) x 6 using left mammory artery and right greater saphenous  vein harvested endoscopically. [place] to LAD, SVG to Diagonal, SVG Sequential to OM2 and ramus intermediate, SVG Sequential to RCA and PDA;  Surgeon: [name], MD;  Location: [facility];  Service: Open Heart Surgery;  Laterality: N/A;   KNEE ARTHROSCOPY     right knee   LUMBAR LAMINECTOMY/DECOMPRESSION MICRODISCECTOMY Bilateral 04/10/2015   Procedure: LUMBAR LAMINECTOMY/DECOMPRESSION MICRODISCECTOMY 2 LEVELS;  Surgeon: Ashok Pall, MD;  Location: [facility];  Service: Neurosurgery;  Laterality: Bilateral;  Bilateral L45 L5S1 Laminectomy and Foraminotomy   ROTATOR CUFF REPAIR Right    SKIN CANCER EXCISION     melanoma on scalp   TEE WITHOUT CARDIOVERSION N/A 05/17/2016   Procedure: TRANSESOPHAGEAL ECHOCARDIOGRAM (TEE);  Surgeon: [name], MD;  Location: [facility];  Service: Open Heart Surgery;  Laterality: N/A;   TONSILLECTOMY AND ADENOIDECTOMY     WISDOM TOOTH EXTRACTION     Current Outpatient Medications on File Prior to Visit  Medication Sig Dispense Refill   aspirin 81 MG tablet Take 81 mg by mouth daily.     atorvastatin (LIPITOR) 80 MG tablet Take 1 tablet (80 mg total) by mouth at bedtime. 90 tablet 0   Blood Glucose Monitoring Suppl ([place]) w/Device KIT USE TO CHECK BLOOD SUGAR ONCE A DAY AND AS NEEDED. DX CODE: E11.8 1 kit 0   dapagliflozin propanediol (FARXIGA) 5 MG TABS tablet Take 1 tablet (5 mg total) by mouth daily before breakfast. 30 tablet 6   glipiZIDE (GLUCOTROL) 5 MG tablet TAKE 1 TABLET (5 MG  TOTAL) BY MOUTH 2 (TWO) TIMES DAILY BEFORE A MEAL. TAKE 1 TABLET EVERY MORNING BEFORE BREAKFAST AND TAKE 1/2 TABLET IN THE IN THE EVENING 180 tablet 1   levothyroxine (SYNTHROID) 112 MCG tablet Take 1 tablet (112 mcg total) by mouth daily. 90 tablet 3   metFORMIN (GLUCOPHAGE-XR) 500 MG 24 hr tablet TAKE 1 TABLET (500 MG TOTAL) BY MOUTH IN THE MORNING AND AT BEDTIME. 180 tablet 1   OneTouch Delica Lancets 29J MISC 1 Device by Does not apply route daily in the afternoon. 100 each 3   ONETOUCH VERIO test strip USE TO CHECK BLOOD SUGAR ONCE A DAY AND AS NEEDED. DX CODE: E11.8 100 strip 1   Current Facility-Administered Medications on File Prior to Visit  Medication Dose Route Frequency Provider Last Rate Last Admin   0.9 %  sodium chloride infusion  500 mL Intravenous Once Jackquline Denmark, MD       No Known Allergies  Current Medications, Allergies, Past Medical History, Past Surgical History, Family History and Social History were reviewed in [employer] record.  Review of Systems:   Constitutional: Negative for fever, sweats, chills or weight loss.  Respiratory: Negative for shortness of breath.   Cardiovascular: Negative for chest pain, palpitations and leg swelling.  Gastrointestinal: See HPI.  Musculoskeletal: + Back pain.  Neurological: Negative for dizziness, headaches or paresthesias.   Physical Exam: BP 114/76   Pulse 64   Ht 6' (1.829 m)   Wt 185 lb (83.9 kg)   BMI 25.09 kg/m   Wt Readings from Last 3 Encounters:  10/20/22 185 lb (83.9 kg)  09/01/22 186 lb (84.4 kg)  06/22/22 194 lb (88 kg)    General: 81 year old male in no acute distress. Head: Normocephalic and atraumatic. Eyes: No scleral icterus. Conjunctiva pink . Ears: Normal auditory acuity. Mouth: Dentition intact. No ulcers or lesions.  Lungs: Clear throughout to auscultation. Heart: Regular rate and rhythm, no  murmur. Abdomen: Soft, nontender and nondistended. No masses or hepatomegaly.  Normal bowel sounds x 4 quadrants.  Rectal: Deferred. Musculoskeletal: Symmetrical with no gross deformities. Extremities: No edema. Neurological: Alert oriented x 4. No focal deficits.  Psychological: Alert and cooperative. Normal mood and affect  Assessment and Recommendations:  39) 81 year old male with a benign esophageal stenosis s/p dilatation per EGD 04/30/2021 with recurrent dysphagia. Episodes of dysphagia possibly triggering vasovagal response as patient feels foggy headed and sometimes near faint during these episodes. Patient stated his current symptoms are similar to the symptoms he had prior to his 04/2021 EGD which improved for 6 to 8 months post esophageal dilatation. He is no longer taking PPI. -EGD with possible esophageal dilatation benefits and risks discussed including risk with sedation, risk of bleeding, perforation and infection  -Consider future barium swallow study with tablet to further evaluate for esophageal dysmotility -Protonix 40 mg once daily -Patient instructed not to eat while driving a car -Patient to contact office if his symptoms worsen prior to his EGD date  2) History of tubular adenomatous and hyperplastic colon polyps.  His most recent colonoscopy 10/17/2018 identified 2 tubular adenomatous polyps and 1 hyperplastic polyp removed from the ascending and descending colon.  A repeat colonoscopy in 3 years was recommended.  Maternal grandmother with history of colon cancer. -Colonoscopy benefits and risks discussed including risk with sedation, risk of bleeding, perforation and infection .  Dr. Lyndel Safe to further verify if a colonoscopy warranted at the age of 5. ADDENDUM: Patient to proceed with colonoscopy at time of EGD as verified by Dr. Lyndel Safe.   3) Constipation -MiraLAX nightly  4) Coronary artery disease s/p DES x 2 s/p 6 vessel 04/2016 ASA.  No chest pain, palpitations, dizziness or SOB.   5) DM II

## 2022-10-20 ENCOUNTER — Ambulatory Visit (INDEPENDENT_AMBULATORY_CARE_PROVIDER_SITE_OTHER): Payer: Medicare Other | Admitting: Nurse Practitioner

## 2022-10-20 ENCOUNTER — Encounter: Payer: Self-pay | Admitting: Nurse Practitioner

## 2022-10-20 VITALS — BP 114/76 | HR 64 | Ht 72.0 in | Wt 185.0 lb

## 2022-10-20 DIAGNOSIS — Z86010 Personal history of colonic polyps: Secondary | ICD-10-CM | POA: Diagnosis not present

## 2022-10-20 DIAGNOSIS — K5900 Constipation, unspecified: Secondary | ICD-10-CM | POA: Diagnosis not present

## 2022-10-20 DIAGNOSIS — R1310 Dysphagia, unspecified: Secondary | ICD-10-CM | POA: Diagnosis not present

## 2022-10-20 MED ORDER — NA SULFATE-K SULFATE-MG SULF 17.5-3.13-1.6 GM/177ML PO SOLN: 1.0000 | Freq: Once | ORAL | 0 refills | Status: AC

## 2022-10-20 MED ORDER — PANTOPRAZOLE SODIUM 40 MG PO TBEC: 40.0000 mg | DELAYED_RELEASE_TABLET | Freq: Every day | ORAL | 2 refills | Status: DC

## 2022-10-20 NOTE — Patient Instructions (Addendum)
You have been scheduled for an endoscopy and colonoscopy. Please follow the written instructions given to you at your visit today. Please pick up your prep supplies at the pharmacy within the next 1-3 days. If you use inhalers (even only as needed), please bring them with you on the day of your procedure.   We have sent the following medications to your pharmacy for you to pick up at your convenience: Protonix once daily 30 minutes before breakfast  Miralax- take 1 capful mixed in 8 ounces of water once daily as tolerated.  Due to recent changes in healthcare laws, you may see the results of your imaging and laboratory studies on MyChart before your provider has had a chance to review them.  We understand that in some cases there may be results that are confusing or concerning to you. Not all laboratory results come back in the same time frame and the provider may be waiting for multiple results in order to interpret others.  Please give [place] 48 hours in order for your provider to thoroughly review all the results before contacting the office for clarification of your results.    It was a pleasure to see you today!  Thank you for trusting me with your gastrointestinal care!

## 2022-10-26 ENCOUNTER — Telehealth: Payer: Self-pay | Admitting: *Deleted

## 2022-10-26 NOTE — Chronic Care Management (AMB) (Signed)
  Care Coordination  Outreach Note  10/26/2022 Name: Jack Wade MRN: [id number] DOB: [birth year]   Care Coordination Outreach Attempts: An unsuccessful telephone outreach was attempted today to offer the patient information about available care coordination services as a benefit of their health plan.   Follow Up Plan:  Additional outreach attempts will be made to offer the patient care coordination information and services.   Encounter Outcome:  No Answer  Julian Hy, [place] Direct Dial: [phone]

## 2022-10-30 NOTE — Progress Notes (Signed)
Agree with assessment/plan.  Raj Addysen Louth, MD [facility] [phone]  

## 2022-11-10 ENCOUNTER — Other Ambulatory Visit: Payer: Self-pay | Admitting: Family Medicine

## 2022-11-10 DIAGNOSIS — E785 Hyperlipidemia, unspecified: Secondary | ICD-10-CM

## 2022-11-15 ENCOUNTER — Other Ambulatory Visit: Payer: Self-pay | Admitting: Internal Medicine

## 2022-11-15 DIAGNOSIS — E118 Type 2 diabetes mellitus with unspecified complications: Secondary | ICD-10-CM

## 2022-11-26 ENCOUNTER — Telehealth: Payer: Self-pay | Admitting: *Deleted

## 2022-11-26 NOTE — Telephone Encounter (Signed)
Dr. Lyndel Safe,   This pt is scheduled with you on December 11.  He is a documented difficult intubation and his procedure will need to be done at the hospital.   Thanks,  Osvaldo Angst

## 2022-11-28 ENCOUNTER — Encounter: Payer: Self-pay | Admitting: Certified Registered Nurse Anesthetist

## 2022-11-29 ENCOUNTER — Telehealth: Payer: Self-pay | Admitting: *Deleted

## 2022-11-29 NOTE — Telephone Encounter (Signed)
Yesi,   This pt is a documented difficult intubation and his procedure will need to be done at the hospital.   Thanks,  Ringo Sherod 

## 2022-11-29 NOTE — Telephone Encounter (Signed)
Dr. Lyndel Safe,   This pt is a documented difficult intubation and his procedure will need to be done at the hospital.   Thanks,  Osvaldo Angst

## 2022-11-29 NOTE — Telephone Encounter (Signed)
EGD/colon at [facility] (for difficult intubation) RG

## 2022-11-30 NOTE — Telephone Encounter (Signed)
Pt made aware Dr. Lyndel Safe recommendations:  Pt notified that he would be placed on our wait list and as soon as we have availability then we would reach out to him with the date and time: EGD/Colon scheduled for the 12/05/2022 canceled "per provider": Pt verbalized understanding with all questions answered.

## 2022-12-05 ENCOUNTER — Encounter: Payer: Medicare Other | Admitting: Gastroenterology

## 2022-12-06 NOTE — Progress Notes (Signed)
  Care Coordination  Outreach Note  12/06/2022 Name: PAXTON BINNS MRN: [id number] DOB: 09-06-1941   Care Coordination Outreach Attempts: A third unsuccessful outreach was attempted today to offer the patient with information about available care coordination services as a benefit of their health plan.   Follow Up Plan:  No further outreach attempts will be made at this time. We have been unable to contact the patient to offer or enroll patient in care coordination services  Encounter Outcome:  No Answer  Julian Hy, [place] Direct Dial: [phone]

## 2022-12-07 ENCOUNTER — Other Ambulatory Visit: Payer: Self-pay

## 2022-12-07 ENCOUNTER — Telehealth: Payer: Self-pay

## 2022-12-07 DIAGNOSIS — R1319 Other dysphagia: Secondary | ICD-10-CM

## 2022-12-07 DIAGNOSIS — K5900 Constipation, unspecified: Secondary | ICD-10-CM

## 2022-12-07 DIAGNOSIS — Z86010 Personal history of colonic polyps: Secondary | ICD-10-CM

## 2022-12-07 DIAGNOSIS — R1310 Dysphagia, unspecified: Secondary | ICD-10-CM

## 2022-12-07 NOTE — Telephone Encounter (Signed)
Pt was made aware of the availability that Dr. Lyndel Safe  has on 12/13/2022 at [facility] to perform his EGD/Colon: Pt stated that he was available for that date: Pt was scheduled for EGD/ Colonoscopy on 12/13/2022 at [facility] with Dr. Lyndel Safe at 7:45 AM: Eartha Inch time 6:15 AM: Pt made aware: Prep instructions was sent to pt via my chart: Pt made aware Ambulatory referral sent to GI  Pt verbalized understanding with all questions answered.

## 2022-12-08 ENCOUNTER — Encounter (HOSPITAL_COMMUNITY): Payer: Self-pay | Admitting: Gastroenterology

## 2022-12-08 NOTE — Progress Notes (Signed)
Attempted to obtain medical history via telephone, unable to reach at this time. HIPAA compliant voicemail message left requesting return call to pre surgical testing department. 

## 2022-12-12 NOTE — Anesthesia Preprocedure Evaluation (Signed)
Anesthesia Evaluation  Patient identified by MRN, date of birth, ID band Patient awake    Reviewed: Allergy & Precautions, NPO status , Patient's Chart, lab work & pertinent test results  History of Anesthesia Complications Negative for: history of anesthetic complications  Airway Mallampati: I  TM Distance: <3 FB Neck ROM: Full    Dental   Pulmonary former smoker   Pulmonary exam normal        Cardiovascular hypertension, + CAD, + Cardiac Stents, + CABG (2017) and + Peripheral Vascular Disease  Normal cardiovascular exam  Echo 05/13/16: Normal LV systolic function; grade 1 diastolic dysfunction; trace AI.   Stable at last cardiology f/u June 2023   Neuro/Psych negative neurological ROS     GI/Hepatic Neg liver ROS,,,Dysphagia Colon polyps   Endo/Other  diabetes, Type 2, Oral Hypoglycemic AgentsHypothyroidism    Renal/GU CRFRenal disease  negative genitourinary   Musculoskeletal negative musculoskeletal ROS (+)    Abdominal   Peds  Hematology negative hematology ROS (+)   Anesthesia Other Findings   Reproductive/Obstetrics                             Anesthesia Physical Anesthesia Plan  ASA: 3  Anesthesia Plan: MAC   Post-op Pain Management: Minimal or no pain anticipated   Induction: Intravenous  PONV Risk Score and Plan: 1 and Propofol infusion, TIVA and Treatment may vary due to age or medical condition  Airway Management Planned: Natural Airway, Nasal Cannula and Simple Face Mask  Additional Equipment: None  Intra-op Plan:   Post-operative Plan:   Informed Consent: I have reviewed the patients History and Physical, chart, labs and discussed the procedure including the risks, benefits and alternatives for the proposed anesthesia with the patient or authorized representative who has indicated his/her understanding and acceptance.       Plan Discussed with:    Anesthesia Plan Comments:        Anesthesia Quick Evaluation

## 2022-12-13 ENCOUNTER — Ambulatory Visit (HOSPITAL_BASED_OUTPATIENT_CLINIC_OR_DEPARTMENT_OTHER): Payer: Medicare Other | Admitting: Anesthesiology

## 2022-12-13 ENCOUNTER — Ambulatory Visit (HOSPITAL_COMMUNITY): Payer: Medicare Other | Admitting: Anesthesiology

## 2022-12-13 ENCOUNTER — Encounter (HOSPITAL_COMMUNITY): Payer: Self-pay | Admitting: Gastroenterology

## 2022-12-13 ENCOUNTER — Ambulatory Visit (HOSPITAL_COMMUNITY)
Admission: RE | Admit: 2022-12-13 | Discharge: 2022-12-13 | Disposition: A | Discharge status: Home / Self Care | Payer: Medicare Other | Attending: Gastroenterology | Admitting: Gastroenterology

## 2022-12-13 ENCOUNTER — Other Ambulatory Visit: Payer: Self-pay

## 2022-12-13 ENCOUNTER — Encounter (HOSPITAL_COMMUNITY)
Admission: RE | Disposition: A | Discharge status: Home / Self Care | Payer: Self-pay | Source: Home / Self Care | Attending: Gastroenterology

## 2022-12-13 DIAGNOSIS — Z09 Encounter for follow-up examination after completed treatment for conditions other than malignant neoplasm: Secondary | ICD-10-CM

## 2022-12-13 DIAGNOSIS — Z87891 Personal history of nicotine dependence: Secondary | ICD-10-CM | POA: Insufficient documentation

## 2022-12-13 DIAGNOSIS — I2510 Atherosclerotic heart disease of native coronary artery without angina pectoris: Secondary | ICD-10-CM | POA: Diagnosis not present

## 2022-12-13 DIAGNOSIS — I10 Essential (primary) hypertension: Secondary | ICD-10-CM | POA: Insufficient documentation

## 2022-12-13 DIAGNOSIS — K222 Esophageal obstruction: Secondary | ICD-10-CM | POA: Diagnosis not present

## 2022-12-13 DIAGNOSIS — K641 Second degree hemorrhoids: Secondary | ICD-10-CM | POA: Insufficient documentation

## 2022-12-13 DIAGNOSIS — K5900 Constipation, unspecified: Secondary | ICD-10-CM | POA: Diagnosis not present

## 2022-12-13 DIAGNOSIS — D125 Benign neoplasm of sigmoid colon: Secondary | ICD-10-CM

## 2022-12-13 DIAGNOSIS — Z951 Presence of aortocoronary bypass graft: Secondary | ICD-10-CM | POA: Diagnosis not present

## 2022-12-13 DIAGNOSIS — Z7984 Long term (current) use of oral hypoglycemic drugs: Secondary | ICD-10-CM | POA: Diagnosis not present

## 2022-12-13 DIAGNOSIS — K449 Diaphragmatic hernia without obstruction or gangrene: Secondary | ICD-10-CM | POA: Insufficient documentation

## 2022-12-13 DIAGNOSIS — Z1211 Encounter for screening for malignant neoplasm of colon: Secondary | ICD-10-CM | POA: Insufficient documentation

## 2022-12-13 DIAGNOSIS — Z86010 Personal history of colonic polyps: Secondary | ICD-10-CM | POA: Insufficient documentation

## 2022-12-13 DIAGNOSIS — R1319 Other dysphagia: Secondary | ICD-10-CM

## 2022-12-13 DIAGNOSIS — D122 Benign neoplasm of ascending colon: Secondary | ICD-10-CM | POA: Insufficient documentation

## 2022-12-13 DIAGNOSIS — K5730 Diverticulosis of large intestine without perforation or abscess without bleeding: Secondary | ICD-10-CM | POA: Diagnosis not present

## 2022-12-13 DIAGNOSIS — E1151 Type 2 diabetes mellitus with diabetic peripheral angiopathy without gangrene: Secondary | ICD-10-CM | POA: Diagnosis not present

## 2022-12-13 DIAGNOSIS — Z955 Presence of coronary angioplasty implant and graft: Secondary | ICD-10-CM | POA: Insufficient documentation

## 2022-12-13 DIAGNOSIS — Z794 Long term (current) use of insulin: Secondary | ICD-10-CM | POA: Insufficient documentation

## 2022-12-13 DIAGNOSIS — K635 Polyp of colon: Secondary | ICD-10-CM | POA: Diagnosis not present

## 2022-12-13 DIAGNOSIS — K209 Esophagitis, unspecified without bleeding: Secondary | ICD-10-CM | POA: Diagnosis not present

## 2022-12-13 DIAGNOSIS — K2100 Gastro-esophageal reflux disease with esophagitis, without bleeding: Secondary | ICD-10-CM

## 2022-12-13 DIAGNOSIS — R1310 Dysphagia, unspecified: Secondary | ICD-10-CM | POA: Diagnosis not present

## 2022-12-13 DIAGNOSIS — K2210 Ulcer of esophagus without bleeding: Secondary | ICD-10-CM | POA: Diagnosis not present

## 2022-12-13 DIAGNOSIS — E039 Hypothyroidism, unspecified: Secondary | ICD-10-CM | POA: Insufficient documentation

## 2022-12-13 HISTORY — PX: COLONOSCOPY WITH PROPOFOL: SHX5780

## 2022-12-13 HISTORY — PX: POLYPECTOMY: SHX5525

## 2022-12-13 HISTORY — PX: ESOPHAGOGASTRODUODENOSCOPY (EGD) WITH PROPOFOL: SHX5813

## 2022-12-13 HISTORY — PX: BIOPSY: SHX5522

## 2022-12-13 HISTORY — PX: MALONEY DILATION: SHX5535

## 2022-12-13 LAB — GLUCOSE, CAPILLARY: Glucose-Capillary: 133 mg/dL — ABNORMAL HIGH (ref 70–99)

## 2022-12-13 SURGERY — COLONOSCOPY WITH PROPOFOL
Anesthesia: Monitor Anesthesia Care

## 2022-12-13 MED ORDER — PROPOFOL 500 MG/50ML IV EMUL
INTRAVENOUS | Status: DC | PRN
  Administered 2022-12-13: 150 ug/kg/min via INTRAVENOUS

## 2022-12-13 MED ORDER — PROPOFOL 10 MG/ML IV BOLUS
INTRAVENOUS | Status: AC
  Filled 2022-12-13: qty 20

## 2022-12-13 MED ORDER — SODIUM CHLORIDE 0.9 % IV SOLN: INTRAVENOUS | Status: DC

## 2022-12-13 MED ORDER — LACTATED RINGERS IV SOLN
INTRAVENOUS | Status: DC
  Administered 2022-12-13: 1000 mL via INTRAVENOUS

## 2022-12-13 MED ORDER — PROPOFOL 10 MG/ML IV BOLUS
INTRAVENOUS | Status: DC | PRN
  Administered 2022-12-13: 40 mg via INTRAVENOUS

## 2022-12-13 MED ORDER — LIDOCAINE HCL (CARDIAC) PF 100 MG/5ML IV SOSY
PREFILLED_SYRINGE | INTRAVENOUS | Status: DC | PRN
  Administered 2022-12-13: 60 mg via INTRAVENOUS

## 2022-12-13 MED ORDER — PROPOFOL 500 MG/50ML IV EMUL
INTRAVENOUS | Status: AC
  Filled 2022-12-13: qty 250

## 2022-12-13 SURGICAL SUPPLY — 25 items

## 2022-12-13 NOTE — Anesthesia Postprocedure Evaluation (Signed)
Anesthesia Post Note  Patient: Jack Wade  Procedure(s) Performed: COLONOSCOPY WITH PROPOFOL ESOPHAGOGASTRODUODENOSCOPY (EGD) WITH PROPOFOL BIOPSY Balloon dilation wire-guided [place] POLYPECTOMY     Patient location during evaluation: Endoscopy Anesthesia Type: MAC Level of consciousness: awake and alert Pain management: pain level controlled Vital Signs Assessment: post-procedure vital signs reviewed and stable Respiratory status: spontaneous breathing, nonlabored ventilation and respiratory function stable Cardiovascular status: blood pressure returned to baseline and stable Postop Assessment: no apparent nausea or vomiting Anesthetic complications: no   No notable events documented.  Last Vitals:  Vitals:   12/13/22 0851 12/13/22 0857  BP: 134/77 137/80  Pulse: (!) 58 (!) 58  Resp: 18 16  Temp:    SpO2: 100% 100%    Last Pain:  Vitals:   12/13/22 0857  TempSrc:   PainSc: 0-No pain                 Lidia Collum

## 2022-12-13 NOTE — Transfer of Care (Signed)
Immediate Anesthesia Transfer of Care Note  Patient: Jack Wade  Procedure(s) Performed: COLONOSCOPY WITH PROPOFOL ESOPHAGOGASTRODUODENOSCOPY (EGD) WITH PROPOFOL BIOPSY Balloon dilation wire-guided [place] DILATION POLYPECTOMY  Patient Location: PACU  Anesthesia Type:MAC  Level of Consciousness: drowsy  Airway & Oxygen Therapy: Patient Spontanous Breathing and Patient connected to face mask oxygen  Post-op Assessment: Report given to RN, Post -op Vital signs reviewed and stable, and Patient moving all extremities X 4  Post vital signs: Reviewed and stable  Last Vitals:  Vitals Value Taken Time  BP 127/74 12/13/22 0833  Temp 36.3 C 12/13/22 0833  Pulse 66 12/13/22 0834  Resp 16 12/13/22 0834  SpO2 99 % 12/13/22 0834  Vitals shown include unvalidated device data.  Last Pain:  Vitals:   12/13/22 0833  TempSrc: Temporal  PainSc:          Complications: No notable events documented.

## 2022-12-13 NOTE — H&P (Signed)
10/19/2022 Jack Wade [id number] [birth year]     Chief Complaint: Difficulty swallowing, constipation    History of Present Illness: Jack Wade. Jack Wade is a 81 year old male with a past medical history of hypertension, hyperlipidemia, coronary artery disease s/p DES x 2 s/p 6 vessel CABG 04/2016, DM type II, neuropathy, malignant melanoma 2012, dysphagia and colon polyps. He is known by Dr. Lyndel Safe.  He presents today with complaints of recurrent dysphagia and he is also experiencing constipation.  He has a history of dysphagia and underwent an EGD by Dr. Lyndel Safe 04/30/2021 which showed a benign esophageal stenosis which was dilated, a 2 cm hiatal hernia and presbyesophagus.  Distal esophageal biopsy showed reflux without evidence of Barrett's esophagus.  He took Pantoprazole 40 mg once daily for a few months then stopped taking it as he did not know he had further refills.  He stated his dysphagia symptoms improved for 6 to 8 months post EGD with esophageal dilatation then gradually recurred.  He describes having difficulty swallowing bulky foods such as meat or shredded wheat which gets hung in his throat or upper esophagus then triggers a response after few bites which includes feeling foggy in his head like he might pass out but does not.  He drinks a few sips of water and the stuck food passes down.  No heartburn or upper abdominal pain.  He underwent a barium swallow study 03/30/2012 which was normal.  He takes ASA 81 mg daily.  No other NSAIDs.  No alcohol use.  He complains of having constipation.  He can go 1 week without passing a bowel movement.  No lower abdominal pain.  He drinks warm prune juice and takes 2 senna laxative tablets when he feels desperate which results in emptying out a large amount of stool.  No rectal bleeding or black stools.  He denies taking any fiber supplement.  His most recent colonoscopy was 10/17/2018 and 2 tubular adenomatous polyps and one hyperplastic polyp  were removed from the colon.  A few diverticula were in the sigmoid colon and ascending colon.  He was advised by Dr. Lyndel Safe to repeat a colonoscopy in 3 years.  Maternal grandmother with history of colon cancer.     History of coronary artery disease s/p DES x 2 s/p 6 vessel CABG 04/2016.  He was last seen by his cardiologist Dr. Stanford Breed 06/22/2022.  At that time, his cardiac status was stable and he was instructed to continue aspirin and a statin.       Latest Ref Rng & Units 03/15/2022    9:32 AM 03/15/2022    9:18 AM 07/16/2021    8:33 AM  CBC  WBC 4.0 - 10.5 K/uL   9.5  7.0   Hemoglobin 13.0 - 17.0 g/dL 15.6  14.6  13.9   Hematocrit 39.0 - 52.0 % 46.0  45.3  41.8   Platelets 150 - 400 K/uL   233  235.0         Latest Ref Rng & Units 03/15/2022    9:32 AM 03/15/2022    9:18 AM 03/01/2022    9:26 AM  CMP  Glucose 70 - 99 mg/dL 158  157  157   BUN 8 - 23 mg/dL _0 Creatinine 0.61 - 1.24 mg/dL 1.60  1.44  1.40   Sodium 135 - 145 mmol/L 137  136  137   Potassium 3.5 - 5.1 mmol/L 4.5  4.4  4.6   Chloride 98 - 111 mmol/L 103  103  102   CO2 22 - 32 mmol/L   23  26   Calcium 8.9 - 10.3 mg/dL   10.0  9.4   Total Protein 6.5 - 8.1 g/dL   7.5     Total Bilirubin 0.3 - 1.2 mg/dL   0.8     Alkaline Phos 38 - 126 U/L   121     AST 15 - 41 U/L   19     ALT 0 - 44 U/L   15         EGD 04/30/2021: - Benign-appearing esophageal stenosis. Dilated. Biopsied. - 2 cm hiatal hernia. - Presbyesophagus Surgical [P], distal esophagus - GASTROESOPHAGEAL MUCOSA WITH INFLAMMATION CONSISTENT WITH REFLUX. - NO INTESTINAL METAPLASIA, DYSPLASIA, OR CARCINOMA.   Colonoscopy 10/17/2018: A 8 mm polyp was found in the mid ascending colon. The polyp was sessile. The polyp was removed with a cold snare. Resection and retrieval were complete. Estimated blood loss: none. - Two sessile polyps were found in the descending colon. The polyps were 6 to 8 mm in size. These polyps were removed with a cold  snare. Resection and retrieval were complete. Estimated blood loss was minimal. - A few small-mouthed diverticula were found in the sigmoid colon and ascending colon. - Non-bleeding internal hemorrhoids were found during retroflexion. The hemorrhoids were small. - The exam was otherwise without abnormality on direct and retroflexion views. -Colonic polyps status post polypectomy -Mild pancolonic diverticulosis -Otherwise normal colonoscopy. - 3 year colonoscopy recall Path report: 1. Surgical [P], ascending, polyp - TUBULAR ADENOMA(S). - HIGH GRADE DYSPLASIA IS NOT IDENTIFIED. 2. Surgical [P], descending, polyp (2) - TUBULAR ADENOMA(S). - HYPERPLASTIC POLYP(S). - HIGH GRADE DYSPLASIA IS NOT IDENTIFIED       Past Medical History:  Diagnosis Date   Aortic calcification ([place]) 02/09/2015   Appendicitis     Back pain 02/09/2015   CAD (coronary artery disease)      2 stents   Cataract      bilateral   Chicken pox     Diabetes mellitus type 2 in obese ([place]) 04/11/2016   Diabetes type 2, controlled ([place])     Dysphagia, unspecified(787.20) 03/20/2014   Hereditary and idiopathic peripheral neuropathy 02/09/2015   Hyperlipidemia     Hypertension     Hypothyroidism     Light headedness 07/04/2017   Measles     Medicare annual wellness visit, subsequent 06/14/2015   Melanoma ([place])      Scalp. 2012   Mumps as a child   Thyroid disease      [place] Hyperthyroidism         Past Surgical History:  Procedure Laterality Date   APPENDECTOMY       BACK SURGERY        x2 lumbar   BACK SURGERY        late 1990s had 2 surgeries, first surgery lifted a heavy engine ruptured disds, at L4 and L5, cleaned discs no hardware very helpful. 2 years later while recovering from angiogram with weights applied to left leg caused a recurrence and required surgery again in same area with disc repair   [place] N/A 05/11/2016    Procedure: Left Heart Cath and  Coronary Angiography;  Surgeon: Belva Crome, MD;  Location: [place] [facility];  Service: Cardiovascular;  Laterality: N/A;   CARDIAC CATHETERIZATION N/A 05/11/2016  Procedure: Intravascular Pressure Wire/FFR Study;  Surgeon: Belva Crome, MD;  Location: [place] [facility];  Service: Cardiovascular;  Laterality: N/A;   CORONARY ANGIOPLASTY WITH STENT PLACEMENT        2 stents   CORONARY ARTERY BYPASS GRAFT N/A 05/17/2016    Procedure: CORONARY ARTERY BYPASS GRAFTING (CABG) x 6 using left mammory artery and right greater saphenous vein harvested endoscopically. [place] to LAD, SVG to Diagonal, SVG Sequential to OM2 and ramus intermediate, SVG Sequential to RCA and PDA;  Surgeon: [name], MD;  Location: [place];  Service: Open Heart Surgery;  Laterality: N/A;   KNEE ARTHROSCOPY        right knee   LUMBAR LAMINECTOMY/DECOMPRESSION MICRODISCECTOMY Bilateral 04/10/2015    Procedure: LUMBAR LAMINECTOMY/DECOMPRESSION MICRODISCECTOMY 2 LEVELS;  Surgeon: Ashok Pall, MD;  Location: [place] [facility];  Service: Neurosurgery;  Laterality: Bilateral;  Bilateral L45 L5S1 Laminectomy and Foraminotomy   ROTATOR CUFF REPAIR Right     SKIN CANCER EXCISION        melanoma on scalp   TEE WITHOUT CARDIOVERSION N/A 05/17/2016    Procedure: TRANSESOPHAGEAL ECHOCARDIOGRAM (TEE);  Surgeon: [name], MD;  Location: [place];  Service: Open Heart Surgery;  Laterality: N/A;   TONSILLECTOMY AND ADENOIDECTOMY       WISDOM TOOTH EXTRACTION              Current Outpatient Medications on File Prior to Visit  Medication Sig Dispense Refill   aspirin 81 MG tablet Take 81 mg by mouth daily.       atorvastatin (LIPITOR) 80 MG tablet Take 1 tablet (80 mg total) by mouth at bedtime. 90 tablet 0   Blood Glucose Monitoring Suppl ([place]) w/Device KIT USE TO CHECK BLOOD SUGAR ONCE A DAY AND AS NEEDED. DX CODE: E11.8 1 kit 0   dapagliflozin propanediol (FARXIGA) 5 MG TABS tablet Take 1 tablet (5 mg total)  by mouth daily before breakfast. 30 tablet 6   glipiZIDE (GLUCOTROL) 5 MG tablet TAKE 1 TABLET (5 MG TOTAL) BY MOUTH 2 (TWO) TIMES DAILY BEFORE A MEAL. TAKE 1 TABLET EVERY MORNING BEFORE BREAKFAST AND TAKE 1/2 TABLET IN THE IN THE EVENING 180 tablet 1   levothyroxine (SYNTHROID) 112 MCG tablet Take 1 tablet (112 mcg total) by mouth daily. 90 tablet 3   metFORMIN (GLUCOPHAGE-XR) 500 MG 24 hr tablet TAKE 1 TABLET (500 MG TOTAL) BY MOUTH IN THE MORNING AND AT BEDTIME. 180 tablet 1   OneTouch Delica Lancets 03U MISC 1 Device by Does not apply route daily in the afternoon. 100 each 3   ONETOUCH VERIO test strip USE TO CHECK BLOOD SUGAR ONCE A DAY AND AS NEEDED. DX CODE: E11.8 100 strip 1             Current Facility-Administered Medications on File Prior to Visit  Medication Dose Route Frequency Provider Last Rate Last Admin   0.9 %  sodium chloride infusion  500 mL Intravenous Once Jackquline Denmark, MD        No Known Allergies   Current Medications, Allergies, Past Medical History, Past Surgical History, Family History and Social History were reviewed in [employer] record.   Review of Systems:   Constitutional: Negative for fever, sweats, chills or weight loss.  Respiratory: Negative for shortness of breath.   Cardiovascular: Negative for chest pain, palpitations and leg swelling.  Gastrointestinal: See HPI.  Musculoskeletal: + Back pain.  Neurological: Negative for dizziness,  headaches or paresthesias.    Physical Exam: BP 114/76   Pulse 64   Ht 6' (1.829 m)   Wt 185 lb (83.9 kg)   BMI 25.09 kg/m       Wt Readings from Last 3 Encounters:  10/20/22 185 lb (83.9 kg)  09/01/22 186 lb (84.4 kg)  06/22/22 194 lb (88 kg)    General: 81 year old male in no acute distress. Head: Normocephalic and atraumatic. Eyes: No scleral icterus. Conjunctiva pink . Ears: Normal auditory acuity. Mouth: Dentition intact. No ulcers or lesions.  Lungs: Clear throughout to  auscultation. Heart: Regular rate and rhythm, no murmur. Abdomen: Soft, nontender and nondistended. No masses or hepatomegaly. Normal bowel sounds x 4 quadrants.  Rectal: Deferred. Musculoskeletal: Symmetrical with no gross deformities. Extremities: No edema. Neurological: Alert oriented x 4. No focal deficits.  Psychological: Alert and cooperative. Normal mood and affect   Assessment and Recommendations:   18) 81 year old male with a benign esophageal stenosis s/p dilatation per EGD 04/30/2021 with recurrent dysphagia. Episodes of dysphagia possibly triggering vasovagal response as patient feels foggy headed and sometimes near faint during these episodes. Patient stated his current symptoms are similar to the symptoms he had prior to his 04/2021 EGD which improved for 6 to 8 months post esophageal dilatation. He is no longer taking PPI. -EGD with possible esophageal dilatation benefits and risks discussed including risk with sedation, risk of bleeding, perforation and infection  -Consider future barium swallow study with tablet to further evaluate for esophageal dysmotility -Protonix 40 mg once daily -Patient instructed not to eat while driving a car -Patient to contact office if his symptoms worsen prior to his EGD date   2) History of tubular adenomatous and hyperplastic colon polyps.  His most recent colonoscopy 10/17/2018 identified 2 tubular adenomatous polyps and 1 hyperplastic polyp removed from the ascending and descending colon.  A repeat colonoscopy in 3 years was recommended.  Maternal grandmother with history of colon cancer. -Colonoscopy benefits and risks discussed including risk with sedation, risk of bleeding, perforation and infection .  Dr. Lyndel Safe to further verify if a colonoscopy warranted at the age of 41. ADDENDUM: Patient to proceed with colonoscopy at time of EGD as verified by Dr. Lyndel Safe.    3) Constipation -MiraLAX nightly   4) Coronary artery disease s/p DES x 2 s/p 6  vessel 04/2016 ASA.  No chest pain, palpitations, dizziness or SOB.    5) DM II     Attending physician's note   I have taken history, reviewed the chart and examined the patient. I performed a substantive portion of this encounter, including complete performance of at least one of the key components, in conjunction with the APP. I agree with the Advanced Practitioner's note, impression and recommendations.   For EGD with dil/colon today   Carmell Austria, MD [facility] [phone]

## 2022-12-13 NOTE — Op Note (Signed)
[facility] Patient Name: Jack Wade Procedure Date: 12/13/2022 MRN: [id number] Attending MD: Jackquline Denmark , MD, [id number] Date of Birth: December 20, 1941 CSN: [id number] Age: 81 Admit Type: Outpatient Procedure:                Colonoscopy Indications:              High risk colon cancer surveillance: Personal                            history of colonic polyps Providers:                Jackquline Denmark, MD, Dulcy Fanny, Darliss Cheney,                            Technician, April Carter, CRNA Referring MD:              Medicines:                Monitored Anesthesia Care Complications:            No immediate complications. Estimated Blood Loss:     Estimated blood loss: none. Procedure:                Pre-Anesthesia Assessment:                           - Prior to the procedure, a History and Physical                            was performed, and patient medications and                            allergies were reviewed. The patient's tolerance of                            previous anesthesia was also reviewed. The risks                            and benefits of the procedure and the sedation                            options and risks were discussed with the patient.                            All questions were answered, and informed consent                            was obtained. Prior Anticoagulants: The patient has                            taken no anticoagulant or antiplatelet agents. ASA                            Grade Assessment: III - A patient with severe  systemic disease. After reviewing the risks and                            benefits, the patient was deemed in satisfactory                            condition to undergo the procedure.                           After obtaining informed consent, the colonoscope                            was passed under direct vision. Throughout the                            procedure,  the patient's blood pressure, pulse, and                            oxygen saturations were monitored continuously. The                            PCF-HQ190L ([id number]) Olympus colonoscope was                            introduced through the anus and advanced to the the                            cecum, identified by appendiceal orifice and                            ileocecal valve. The colonoscopy was performed                            without difficulty. The patient tolerated the                            procedure well. The quality of the bowel                            preparation was adequate to identify polyps greater                            than 5 mm in size. There was some retained solid                            stool and vegetable material which would clog the                            suction channel of the scope. Aggressive suctioning                            and aspiration was performed. Overall over 90-95%  of the colonic mucosa was visualized                            satisfactorily. The ileocecal valve, appendiceal                            orifice, and rectum were photographed. Scope In: 8:13:28 AM Scope Out: 8:28:15 AM Scope Withdrawal Time: 0 hours 12 minutes 7 seconds  Total Procedure Duration: 0 hours 14 minutes 47 seconds  Findings:      Two sessile polyps were found in the distal sigmoid colon and mid       ascending colon. The polyps were 4 to 8 mm in size. These polyps were       removed with a cold snare. Resection and retrieval were complete.      A few medium-mouthed diverticula were found in the sigmoid colon,       transverse colon and ascending colon.      Non-bleeding internal hemorrhoids were found during retroflexion. The       hemorrhoids were moderate and Grade II (internal hemorrhoids that       prolapse but reduce spontaneously).      The exam was otherwise without abnormality on direct and retroflexion        views. Impression:               - Two 4 to 8 mm polyps in the distal sigmoid colon                            and in the mid ascending colon, removed with a cold                            snare. Resected and retrieved.                           - Mild pancolonic diverticulosis.                           - Non-bleeding internal hemorrhoids.                           - The examination was otherwise normal on direct                            and retroflexion views. Moderate Sedation:      Not Applicable - Patient had care per Anesthesia. Recommendation:           - Patient has a contact number available for                            emergencies. The signs and symptoms of potential                            delayed complications were discussed with the                            patient. Return to normal activities tomorrow.  Written discharge instructions were provided to the                            patient.                           - Resume previous diet.                           - Continue present medications.                           - Await pathology results.                           - Repeat colonoscopy is not recommended for                            screening purposes.                           - Return to GI clinic PRN. Procedure Code(s):        --- Professional ---                           [phone], Colonoscopy, flexible; with removal of                            tumor(s), polyp(s), or other lesion(s) by snare                            technique Diagnosis Code(s):        --- Professional ---                           Z86.010, Personal history of colonic polyps                           D12.5, Benign neoplasm of sigmoid colon                           D12.2, Benign neoplasm of ascending colon                           K64.1, Second degree hemorrhoids                           K57.30, Diverticulosis of large intestine without                             perforation or abscess without bleeding CPT copyright 2022 [employer]. All rights reserved. The codes documented in this report are preliminary and upon coder review may  be revised to meet current compliance requirements. Jackquline Denmark, MD 12/13/2022 8:34:22 AM This report has been signed electronically. Number of Addenda: 0

## 2022-12-13 NOTE — Op Note (Signed)
[facility] Patient Name: Jack Wade Procedure Date: 12/13/2022 MRN: [id number] Attending MD: Jackquline Denmark , MD, [id number] Date of Birth: 05-23-1941 CSN: [id number] Age: 81 Admit Type: Outpatient Procedure:                Upper GI endoscopy Indications:              Dysphagia Providers:                Jackquline Denmark, MD, Dulcy Fanny, Darliss Cheney,                            Technician, April Carter, CRNA Referring MD:              Medicines:                Monitored Anesthesia Care Complications:            No immediate complications. Estimated Blood Loss:     Estimated blood loss: none. Procedure:                Pre-Anesthesia Assessment:                           - Prior to the procedure, a History and Physical                            was performed, and patient medications and                            allergies were reviewed. The patient's tolerance of                            previous anesthesia was also reviewed. The risks                            and benefits of the procedure and the sedation                            options and risks were discussed with the patient.                            All questions were answered, and informed consent                            was obtained. Prior Anticoagulants: The patient has                            taken no anticoagulant or antiplatelet agents. ASA                            Grade Assessment: III - A patient with severe                            systemic disease. After reviewing the risks and  benefits, the patient was deemed in satisfactory                            condition to undergo the procedure.                           After obtaining informed consent, the endoscope was                            passed under direct vision. Throughout the                            procedure, the patient's blood pressure, pulse, and                            oxygen  saturations were monitored continuously. The                            [id number]-H190 ([id number]) Olympus endoscope was introduced                            through the mouth, and advanced to the second part                            of duodenum. The upper GI endoscopy was                            accomplished without difficulty. The patient                            tolerated the procedure well. Scope In: Scope Out: Findings:      The esophagus was mildly tortuous in the distal one third of the       esophagus. One benign-appearing, intrinsic moderate (circumferential       scarring or stenosis; an endoscope may pass) stenosis was found 40 cm       from the incisors. This stenosis measured 1.2 cm (inner diameter) x less       than one cm (in length). The stenosis was traversed. A TTS dilator was       passed through the scope. Dilation with a 15-16.5-18 mm balloon dilator       was performed to 16.5 mm. The scope was withdrawn. Dilation was       performed with a Maloney dilator with mild resistance at 52 Fr. The       dilation site was examined and showed moderate mucosal disruption and       moderate improvement in luminal narrowing. Biopsies were taken with a       cold forceps for histology.      LA Grade C (one or more mucosal breaks continuous between tops of 2 or       more mucosal folds, less than 75% circumference) esophagitis with no       bleeding was found 39 to 40 cm from the incisors. Biopsies were taken       with a cold forceps for histology.      A 2 cm hiatal hernia was present.  The entire examined stomach was normal. Biopsies were taken with a cold       forceps for histology.      The examined duodenum was normal. Impression:               - Benign-appearing esophageal stenosis. Dilated.                            Biopsied.                           - LA Grade C reflux esophagitis with no bleeding.                            Biopsied.                            - 2 cm hiatal hernia.                           - Normal stomach. Biopsied.                           - Normal examined duodenum. Moderate Sedation:      Not Applicable - Patient had care per Anesthesia. Recommendation:           - Patient has a contact number available for                            emergencies. The signs and symptoms of potential                            delayed complications were discussed with the                            patient. Return to normal activities tomorrow.                            Written discharge instructions were provided to the                            patient.                           - Post dilatation diet.                           - Continue present medications including Protonix                            40 mg p.o. daily.                           - Await pathology results.                           - Recommend repeating EGD in 12 weeks with 3  biopsying distal esophagus to rule out underlying                            Barrett's esophagus and repeat dilatation if                            needed. Earlier, if with symptoms. Would also                            consider barium swallow prior to EGD if he had                            continued symptoms.                           - The findings and recommendations were discussed                            with the patient's family. Procedure Code(s):        --- Professional ---                           [phone], Esophagogastroduodenoscopy, flexible,                            transoral; with transendoscopic balloon dilation of                            esophagus (less than 30 mm diameter)                           43239, 59, Esophagogastroduodenoscopy, flexible,                            transoral; with biopsy, single or multiple Diagnosis Code(s):        --- Professional ---                           K22.2, Esophageal obstruction                            K21.00, Gastro-esophageal reflux disease with                            esophagitis, without bleeding                           K44.9, Diaphragmatic hernia without obstruction or                            gangrene                           R13.10, Dysphagia, unspecified CPT copyright 2022 American [employer] Association. All rights reserved. The codes documented in this report are preliminary and upon coder review may  be revised to meet current compliance requirements. Jackquline Denmark, MD 12/13/2022 8:10:46  AM This report has been signed electronically. Number of Addenda: 0

## 2022-12-13 NOTE — Discharge Instructions (Addendum)
Add miralax 17g daily.  YOU HAD AN ENDOSCOPIC PROCEDURE TODAY: Refer to the procedure report and other information in the discharge instructions given to you for any specific questions about what was found during the examination. If this information does not answer your questions, please call [place] office at [phone] to clarify.   YOU SHOULD EXPECT: Some feelings of bloating in the abdomen. Passage of more gas than usual. Walking can help get rid of the air that was put into your GI tract during the procedure and reduce the bloating. If you had a lower endoscopy (such as a colonoscopy or flexible sigmoidoscopy) you may notice spotting of blood in your stool or on the toilet paper. Some abdominal soreness may be present for a day or two, also.  DIET: Your first meal following the procedure should be a light meal and then it is ok to progress to your normal diet. A half-sandwich or bowl of soup is an example of a good first meal. Heavy or fried foods are harder to digest and may make you feel nauseous or bloated. Drink plenty of fluids but you should avoid alcoholic beverages for 24 hours. If you had a esophageal dilation, please see attached instructions for diet.    ACTIVITY: Your care partner should take you home directly after the procedure. You should plan to take it easy, moving slowly for the rest of the day. You can resume normal activity the day after the procedure however YOU SHOULD NOT DRIVE, use power tools, machinery or perform tasks that involve climbing or major physical exertion for 24 hours (because of the sedation medicines used during the test).   SYMPTOMS TO REPORT IMMEDIATELY: A gastroenterologist can be reached at any hour. Please call [phone]  for any of the following symptoms:  Following lower endoscopy (colonoscopy, flexible sigmoidoscopy) Excessive amounts of blood in the stool  Significant tenderness, worsening of abdominal pains  Swelling of the abdomen that is  new, acute  Fever of 100 or higher  Following upper endoscopy (EGD, EUS, ERCP, esophageal dilation) Vomiting of blood or coffee ground material  New, significant abdominal pain  New, significant chest pain or pain under the shoulder blades  Painful or persistently difficult swallowing  New shortness of breath  Black, tarry-looking or red, bloody stools  FOLLOW UP:  If any biopsies were taken you will be contacted by phone or by letter within the next 1-3 weeks. Call [phone]  if you have not heard about the biopsies in 3 weeks.  Please also call with any specific questions about appointments or follow up tests.

## 2022-12-13 NOTE — Anesthesia Procedure Notes (Signed)
Procedure Name: MAC Date/Time: 12/13/2022 8:45 AM  Performed by: Lieutenant Diego, CRNAPre-anesthesia Checklist: Patient identified, Patient being monitored and Timeout performed Patient Re-evaluated:Patient Re-evaluated prior to induction Oxygen Delivery Method: Nasal cannula Preoxygenation: Pre-oxygenation with 100% oxygen Induction Type: IV induction

## 2022-12-14 LAB — SURGICAL PATHOLOGY

## 2022-12-19 ENCOUNTER — Encounter (HOSPITAL_COMMUNITY): Payer: Self-pay | Admitting: Gastroenterology

## 2022-12-20 ENCOUNTER — Encounter: Payer: Self-pay | Admitting: Gastroenterology

## 2023-02-03 ENCOUNTER — Other Ambulatory Visit: Payer: Self-pay | Admitting: Internal Medicine

## 2023-02-03 DIAGNOSIS — E118 Type 2 diabetes mellitus with unspecified complications: Secondary | ICD-10-CM

## 2023-02-09 ENCOUNTER — Other Ambulatory Visit: Payer: Self-pay | Admitting: Family Medicine

## 2023-02-09 DIAGNOSIS — E785 Hyperlipidemia, unspecified: Secondary | ICD-10-CM

## 2023-02-10 ENCOUNTER — Other Ambulatory Visit: Payer: Self-pay

## 2023-02-10 ENCOUNTER — Telehealth: Payer: Self-pay | Admitting: Family Medicine

## 2023-02-10 DIAGNOSIS — E785 Hyperlipidemia, unspecified: Secondary | ICD-10-CM

## 2023-02-10 MED ORDER — ATORVASTATIN CALCIUM 80 MG PO TABS: ORAL_TABLET | ORAL | 0 refills | Status: DC

## 2023-02-10 NOTE — Telephone Encounter (Signed)
Prescription Request  02/10/2023  Is this a "Controlled Substance" medicine? No  LOV: Visit date not found  What is the name of the medication or equipment?  atorvastatin (LIPITOR) 80 MG tablet   Have you contacted your pharmacy to request a refill? No   Which pharmacy would you like this sent to?  [id number] [place], [place] - [address] [place] [address]Phone: [phone]: [phone]   Patient notified that their request is being sent to the clinical staff for review and that they should receive a response within 2 business days.   Please advise at Mobile [phone](mobile)

## 2023-02-10 NOTE — Telephone Encounter (Signed)
Medication sent..

## 2023-02-23 ENCOUNTER — Other Ambulatory Visit: Payer: Self-pay

## 2023-02-23 MED ORDER — METFORMIN HCL ER 500 MG PO TB24: 500.0000 mg | ORAL_TABLET | Freq: Two times a day (BID) | ORAL | 1 refills | Status: DC

## 2023-02-24 ENCOUNTER — Telehealth: Payer: Self-pay | Admitting: Internal Medicine

## 2023-02-24 ENCOUNTER — Telehealth: Payer: Self-pay

## 2023-02-24 NOTE — Telephone Encounter (Signed)
I just become aware of the situation. The patient's wife sent a message:  patient has been sick x 6 days, tested positive for COVID. Unable to come to the office (per message from the patient's wife) No recent renal function. Recommend conservative treatment with rest, fluids, Tylenol, Mucinex.  However if the patient feels really unwell he needs to seek immediate attention in the emergency room.

## 2023-02-24 NOTE — Telephone Encounter (Signed)
Called pt and spoke with wife regarding him testing positive For Covid Was advised per Dr.Paz he sent medication to Treat Covid. Pt was advised if symptom worsens  Will need to go to ER. Pt wife  stated understand.

## 2023-03-06 NOTE — Progress Notes (Unsigned)
Name: Jack Wade  MRN/ DOB: [id number], [birth year]   Age/ Sex: 82 y.o., male    PCP: Mosie Lukes, MD   Reason for Endocrinology Evaluation: Type 2 Diabetes Mellitus     Date of Initial Endocrinology Visit: 10/19/2021    PATIENT IDENTIFIER: [name] is a 82 y.o. male with a past medical history of T2DM, Dyslipidemia and GERD . The patient presented for initial endocrinology clinic visit on 10/19/2021 for consultative assistance with his diabetes management.    HPI: [name] was    Diagnosed with DM years ago  Prior Medications tried/Intolerance: as listed  Currently checking blood sugars occasionally  Hypoglycemia episodes : no               Hemoglobin A1c has ranged from 7.1% in 2019, peaking at 7.9% in 2022. Patient required assistance for hypoglycemia: no Patient has required hospitalization within the last 1 year from hyper or hypoglycemia: no  In terms of diet, the patient eats 3 meals, snacks in the evening . The pt drinks sugar sweetened beverages    SUBJECTIVE:   During the last visit (09/01/2022): A1c 7.1%     Today (03/07/23): Jack Wade  is here for a follow up on diabetes management . He  checks his blood sugars 1 times daily. The patient has not had hypoglycemic episodes since the last clinic visit.   He is S/P EGD and colonoscopy 11/2022 Follows with Cardiology for CAD He is on patient assistance program for Farxiga Weight stable   Denies palpitations  Denies diarrhea but has constipation   HOME DIABETES REGIMEN: Glipizide 5 mg 1 tablet BID  Metformin 500 mg 1 tabs BID  Farxiga 5 mg daily - through ot assistance       Statin: yes ACE-I/ARB: no    METER DOWNLOAD SUMMARY: unable to download 83-199 mg/dL     DIABETIC COMPLICATIONS: Microvascular complications:  CKD III, ? retinopathy ( Right eye - stable ) , neuropathy  Denies: neuropathy  Last eye exam: Completed 08/2022  Macrovascular complications:   CAD , PVD Denies: CVA   PAST HISTORY: Past Medical History:  Past Medical History:  Diagnosis Date   Aortic calcification ([place]) 02/09/2015   Appendicitis    Back pain 02/09/2015   CAD (coronary artery disease)    2 stents   CAD, multiple vessel 05/17/2016   CABG x6 vessel   Cataract    bilateral   Chicken pox    Diabetes mellitus type 2 in obese ([place]) 04/11/2016   Diabetes type 2, controlled ([place])    Dysphagia, unspecified(787.20) 03/20/2014   Hereditary and idiopathic peripheral neuropathy 02/09/2015   Hyperlipidemia    Hypertension    Hypothyroidism    Light headedness 07/04/2017   Measles    Medicare annual wellness visit, subsequent 06/14/2015   Melanoma ([place])    Scalp. 2012   Mumps as a child   Thyroid disease    [place] Hyperthyroidism   Past Surgical History:  Past Surgical History:  Procedure Laterality Date   APPENDECTOMY     BACK SURGERY     x2 lumbar   BACK SURGERY     late 1990s had 2 surgeries, first surgery lifted a heavy engine ruptured disds, at L4 and L5, cleaned discs no hardware very helpful. 2 years later while recovering from angiogram with weights applied to left leg caused a recurrence and required surgery again in same area with disc repair   BIOPSY  12/13/2022  Procedure: BIOPSY;  Surgeon: Jackquline Denmark, MD;  Location: [facility];  Service: Gastroenterology;;   CARDIAC CATHETERIZATION     CARDIAC CATHETERIZATION N/A 05/11/2016   Procedure: Left Heart Cath and Coronary Angiography;  Surgeon: Belva Crome, MD;  Location: [facility];  Service: Cardiovascular;  Laterality: N/A;   CARDIAC CATHETERIZATION N/A 05/11/2016   Procedure: Intravascular Pressure Wire/FFR Study;  Surgeon: Belva Crome, MD;  Location: [facility];  Service: Cardiovascular;  Laterality: N/A;   COLONOSCOPY WITH PROPOFOL N/A 12/13/2022   Procedure: COLONOSCOPY WITH PROPOFOL;  Surgeon: Jackquline Denmark, MD;  Location: [facility];  Service: Gastroenterology;   Laterality: N/A;   CORONARY ANGIOPLASTY WITH STENT PLACEMENT     2 stents   CORONARY ARTERY BYPASS GRAFT N/A 05/17/2016   Procedure: CORONARY ARTERY BYPASS GRAFTING (CABG) x 6 using left mammory artery and right greater saphenous vein harvested endoscopically. [place] to LAD, SVG to Diagonal, SVG Sequential to OM2 and ramus intermediate, SVG Sequential to RCA and PDA;  Surgeon: [name], MD;  Location: [facility];  Service: Open Heart Surgery;  Laterality: N/A;   ESOPHAGOGASTRODUODENOSCOPY (EGD) WITH PROPOFOL N/A 12/13/2022   Procedure: ESOPHAGOGASTRODUODENOSCOPY (EGD) WITH PROPOFOL;  Surgeon: Jackquline Denmark, MD;  Location: [facility];  Service: Gastroenterology;  Laterality: N/A;   KNEE ARTHROSCOPY     right knee   LUMBAR LAMINECTOMY/DECOMPRESSION MICRODISCECTOMY Bilateral 04/10/2015   Procedure: LUMBAR LAMINECTOMY/DECOMPRESSION MICRODISCECTOMY 2 LEVELS;  Surgeon: Ashok Pall, MD;  Location: [facility];  Service: Neurosurgery;  Laterality: Bilateral;  Bilateral L45 L5S1 Laminectomy and Foraminotomy   MALONEY DILATION  12/13/2022   Procedure: MALONEY DILATION;  Surgeon: Jackquline Denmark, MD;  Location: [facility];  Service: Gastroenterology;;   POLYPECTOMY  12/13/2022   Procedure: POLYPECTOMY;  Surgeon: Jackquline Denmark, MD;  Location: [facility];  Service: Gastroenterology;;   ROTATOR CUFF REPAIR Right    SKIN CANCER EXCISION     melanoma on scalp   TEE WITHOUT CARDIOVERSION N/A 05/17/2016   Procedure: TRANSESOPHAGEAL ECHOCARDIOGRAM (TEE);  Surgeon: [name], MD;  Location: [facility];  Service: Open Heart Surgery;  Laterality: N/A;   TONSILLECTOMY AND ADENOIDECTOMY     WISDOM TOOTH EXTRACTION      Social History:  reports that he has quit smoking. His smoking use included cigarettes. He started smoking about 39 years ago. He has a 60.00 pack-year smoking history. He has never used smokeless tobacco. He reports current alcohol use. He reports that he does not use drugs. Family History:   Family History  Problem Relation Age of Onset   Heart disease Father    Emphysema Father    Hypertension Father    Cancer Father        lung/ healthy   Heart disease Mother        Deceased   Cancer Mother        breast   Hypertension Mother    Cancer Maternal Grandmother        colon   Diabetes Brother        type 2   Heart disease Brother    Pulmonary embolism Son        vasculiti   Depression Paternal Grandmother    Depression Paternal Aunt      HOME MEDICATIONS: Allergies as of 03/07/2023   No Known Allergies      Medication List        Accurate as of March 07, 2023  9:06 AM. If you have any questions, ask  your nurse or doctor.          aspirin 81 MG tablet Take 81 mg by mouth daily.   atorvastatin 80 MG tablet Commonly known as: LIPITOR TAKE 1 TABLET BY MOUTH EVERYDAY AT BEDTIME   dapagliflozin propanediol 5 MG Tabs tablet Commonly known as: Farxiga Take 1 tablet (5 mg total) by mouth daily before breakfast.   glipiZIDE 5 MG tablet Commonly known as: GLUCOTROL 1 tablet before breakfast and supper   levothyroxine 112 MCG tablet Commonly known as: SYNTHROID TAKE 1 TABLET BY MOUTH EVERY DAY What changed: when to take this   metFORMIN 500 MG 24 hr tablet Commonly known as: GLUCOPHAGE-XR Take 1 tablet (500 mg total) by mouth in the morning and at bedtime.   OneTouch Delica Lancets 99991111 Misc 1 Device by Does not apply route daily in the afternoon.   OneTouch Verio Flex System w/Device Kit USE TO CHECK BLOOD SUGAR ONCE A DAY AND AS NEEDED. DX CODE: E11.8   OneTouch Verio test strip Generic drug: glucose blood USE TO CHECK BLOOD SUGAR ONCE A DAY AND AS NEEDED. DX CODE: E11.8   pantoprazole 40 MG tablet Commonly known as: PROTONIX Take 1 tablet (40 mg total) by mouth daily. Take 30 minutes before breakfast.         ALLERGIES: No Known Allergies      OBJECTIVE:   VITAL SIGNS: BP 120/80 (BP Location: Left Arm, Patient Position:  Sitting, Cuff Size: Large)   Pulse 64   Ht 6' (1.829 m)   Wt 191 lb (86.6 kg)   SpO2 94%   BMI 25.90 kg/m    PHYSICAL EXAM:  General: Pt appears well and is in NAD  Neck: General: Supple without adenopathy or carotid bruits. Thyroid: Thyroid size normal.  No goiter or nodules appreciated.   Lungs: Clear with good BS bilat with no rales, rhonchi, or wheezes  Heart: RRR   Extremities:  Lower extremities - No pretibial edema  Neuro: MS is good with appropriate affect, pt is alert and Ox3     DM Foot Exam 03/07/2023   The skin of the feet is intact without sores or ulcerations. The pedal pulses are undetectable  The sensation is mostly absent to a screening 5.07, 10 gram monofilament bilaterally   DATA REVIEWED:  Lab Results  Component Value Date   HGBA1C 7.7 (A) 03/07/2023   HGBA1C 7.1 (A) 09/01/2022   HGBA1C 7.1 (A) 03/01/2022    Latest Reference Range & Units 03/07/23 09:38  Sodium 135 - 145 mEq/L 137  Potassium 3.5 - 5.1 mEq/L 3.8  Chloride 96 - 112 mEq/L 104  CO2 19 - 32 mEq/L 23  Glucose 70 - 99 mg/dL 169 (H)  BUN 6 - 23 mg/dL 19  Creatinine 0.40 - 1.50 mg/dL 1.35  Calcium 8.4 - 10.5 mg/dL 9.6  Alkaline Phosphatase 39 - 117 U/L 103  Albumin 3.5 - 5.2 g/dL 4.1  AST 0 - 37 U/L 19  ALT 0 - 53 U/L 15  Total Protein 6.0 - 8.3 g/dL 7.0  Total Bilirubin 0.2 - 1.2 mg/dL 0.7  GFR >60.00 mL/min 49.67 (L)  Total CHOL/HDL Ratio  3  Cholesterol 0 - 200 mg/dL 102  HDL Cholesterol >39.00 mg/dL 39.00 (L)  LDL (calc) 0 - 99 mg/dL 30  MICROALB/CREAT RATIO 0.0 - 30.0 mg/g 1.1  NonHDL  63.09  Triglycerides 0.0 - 149.0 mg/dL 167.0 (H)  VLDL 0.0 - 40.0 mg/dL 33.4    Latest Reference Range &  Units 03/07/23 09:38  Creatinine,U mg/dL 64.3  Microalb, Ur 0.0 - 1.9 mg/dL <0.7  MICROALB/CREAT RATIO 0.0 - 30.0 mg/g 1.1        ASSESSMENT / PLAN / RECOMMENDATIONS:   1) Type 2 Diabetes Mellitus, Optimally controlled, With CKD III, Neuropathic, retinopathic and macrovascular  complications - Most recent A1c of 7.7 %. Goal A1c < 7.5%.    -A1c slightly increased to 7.7% , pt has been snacking on candy at night  -BMP shows stable GFR -Limited glycemic agents due to CKD III -He has been approved through patient assistance program to receive Farxiga, unable to increase the dose as the patient does not want any further weight loss -Patient has been snacking at night, we again discussed the importance of avoiding snacks and limiting CHO intake -I will increase his morning glipizide, caution against hypoglycemia especially with increased activity, reviewed the rule of 15 and to notify us  MEDICATIONS: Continue Farxiga 5 mg 1 tablet daily Increase Glipizide 5 mg, 2 tablet before breakfast and 1 tablet before Supper Continue metformin 500 mg 1 tablet twice a day  EDUCATION / INSTRUCTIONS: BG monitoring instructions: Patient is instructed to check his blood sugars 1 times a day, fasting. Call [facility] if: BG persistently < 70  I reviewed the Rule of 15 for the treatment of hypoglycemia in detail with the patient. Literature supplied.   2) Diabetic complications:  Eye: Does  have known diabetic retinopathy.  Neuro/ Feet: Does  have known diabetic peripheral neuropathy. Renal: Patient does  have known baseline CKD. He is not on an ACEI/ARB at present.   3) Dyslipidemia:  -LDL at goal, TG slightly elevated  Medication Continue atorvastatin 80 mg daily Follow-up in 6 months   Signed electronically by: Mack Guise, MD  [facility]  [facility] [address] [place], [place] [address] Phone: [phone] FAX: [phone]   CC: Mosie Lukes, [address] [place] [address] Phone: [phone]  Fax: [phone]    Return to Endocrinology clinic as below: Future Appointments  Date Time Provider [place]  03/07/2023  9:10 AM [place] Fickling, Melanie Crazier,  MD [id number] None

## 2023-03-07 ENCOUNTER — Encounter: Payer: Self-pay | Admitting: Internal Medicine

## 2023-03-07 ENCOUNTER — Other Ambulatory Visit: Payer: Self-pay

## 2023-03-07 ENCOUNTER — Ambulatory Visit (INDEPENDENT_AMBULATORY_CARE_PROVIDER_SITE_OTHER): Payer: Medicare Other | Admitting: Internal Medicine

## 2023-03-07 VITALS — BP 120/80 | HR 64 | Ht 72.0 in | Wt 191.0 lb

## 2023-03-07 DIAGNOSIS — E785 Hyperlipidemia, unspecified: Secondary | ICD-10-CM

## 2023-03-07 DIAGNOSIS — E1142 Type 2 diabetes mellitus with diabetic polyneuropathy: Secondary | ICD-10-CM

## 2023-03-07 DIAGNOSIS — E1159 Type 2 diabetes mellitus with other circulatory complications: Secondary | ICD-10-CM | POA: Diagnosis not present

## 2023-03-07 DIAGNOSIS — E1122 Type 2 diabetes mellitus with diabetic chronic kidney disease: Secondary | ICD-10-CM

## 2023-03-07 DIAGNOSIS — E118 Type 2 diabetes mellitus with unspecified complications: Secondary | ICD-10-CM

## 2023-03-07 DIAGNOSIS — N1831 Chronic kidney disease, stage 3a: Secondary | ICD-10-CM

## 2023-03-07 LAB — COMPREHENSIVE METABOLIC PANEL
ALT: 15 U/L (ref 0–53)
AST: 19 U/L (ref 0–37)
Albumin: 4.1 g/dL (ref 3.5–5.2)
Alkaline Phosphatase: 103 U/L (ref 39–117)
BUN: 19 mg/dL (ref 6–23)
CO2: 23 mEq/L (ref 19–32)
Calcium: 9.6 mg/dL (ref 8.4–10.5)
Chloride: 104 mEq/L (ref 96–112)
Creatinine, Ser: 1.35 mg/dL (ref 0.40–1.50)
GFR: 49.67 mL/min — ABNORMAL LOW (ref >60.00)
Glucose, Bld: 169 mg/dL — ABNORMAL HIGH (ref 70–99)
Potassium: 3.8 mEq/L (ref 3.5–5.1)
Sodium: 137 mEq/L (ref 135–145)
Total Bilirubin: 0.7 mg/dL (ref 0.2–1.2)
Total Protein: 7 g/dL (ref 6.0–8.3)

## 2023-03-07 LAB — LIPID PANEL
Cholesterol: 102 mg/dL (ref 0–200)
HDL: 39 mg/dL — ABNORMAL LOW (ref >39.00)
LDL Cholesterol: 30 mg/dL (ref 0–99)
NonHDL: 63.09
Total CHOL/HDL Ratio: 3
Triglycerides: 167 mg/dL — ABNORMAL HIGH (ref 0.0–149.0)
VLDL: 33.4 mg/dL (ref 0.0–40.0)

## 2023-03-07 LAB — MICROALBUMIN / CREATININE URINE RATIO
Creatinine,U: 64.3 mg/dL
Microalb Creat Ratio: 1.1 mg/g (ref 0.0–30.0)
Microalb, Ur: <0.7 mg/dL (ref 0.0–1.9)

## 2023-03-07 LAB — POCT GLYCOSYLATED HEMOGLOBIN (HGB A1C): Hemoglobin A1C: 7.7 % — AB (ref 4.0–5.6)

## 2023-03-07 MED ORDER — ATORVASTATIN CALCIUM 80 MG PO TABS: ORAL_TABLET | ORAL | 3 refills | Status: DC

## 2023-03-07 MED ORDER — GLIPIZIDE 5 MG PO TABS: ORAL_TABLET | ORAL | 3 refills | Status: DC

## 2023-03-07 NOTE — Patient Instructions (Addendum)
Increase Glipizide 5 mg, 2 tablets before Breakfast and 1 tablet before Supper  Continue Farxiga 5 mg, 1 tablet in the morning  Continue Metformin 500 mg ,1 tablet twice a day   HOW TO TREAT LOW BLOOD SUGARS (Blood sugar LESS THAN 70 MG/DL) Please follow the RULE OF 15 for the treatment of hypoglycemia treatment (when your (blood sugars are less than 70 mg/dL)   STEP 1: Take 15 grams of carbohydrates when your blood sugar is low, which includes:  3-4 GLUCOSE TABS  OR 3-4 OZ OF JUICE OR REGULAR SODA OR ONE TUBE OF GLUCOSE GEL    STEP 2: RECHECK blood sugar in 15 MINUTES STEP 3: If your blood sugar is still low at the 15 minute recheck --> then, go back to STEP 1 and treat AGAIN with another 15 grams of carbohydrates.

## 2023-07-18 ENCOUNTER — Ambulatory Visit (INDEPENDENT_AMBULATORY_CARE_PROVIDER_SITE_OTHER): Payer: Medicare Other | Admitting: Family Medicine

## 2023-07-18 ENCOUNTER — Encounter: Payer: Self-pay | Admitting: Family Medicine

## 2023-07-18 VITALS — BP 120/72 | HR 73 | Temp 97.7°F | Resp 16 | Ht 72.0 in | Wt 189.0 lb

## 2023-07-18 DIAGNOSIS — E038 Other specified hypothyroidism: Secondary | ICD-10-CM

## 2023-07-18 DIAGNOSIS — E7800 Pure hypercholesterolemia, unspecified: Secondary | ICD-10-CM

## 2023-07-18 DIAGNOSIS — I10 Essential (primary) hypertension: Secondary | ICD-10-CM

## 2023-07-18 DIAGNOSIS — E118 Type 2 diabetes mellitus with unspecified complications: Secondary | ICD-10-CM

## 2023-07-18 DIAGNOSIS — R1310 Dysphagia, unspecified: Secondary | ICD-10-CM

## 2023-07-18 DIAGNOSIS — K219 Gastro-esophageal reflux disease without esophagitis: Secondary | ICD-10-CM

## 2023-07-18 DIAGNOSIS — N183 Chronic kidney disease, stage 3 unspecified: Secondary | ICD-10-CM

## 2023-07-18 DIAGNOSIS — K449 Diaphragmatic hernia without obstruction or gangrene: Secondary | ICD-10-CM | POA: Diagnosis not present

## 2023-07-18 LAB — CBC WITH DIFFERENTIAL/PLATELET
Basophils Absolute: 0.1 10*3/uL (ref 0.0–0.1)
Basophils Relative: 1.7 % (ref 0.0–3.0)
Eosinophils Absolute: 0.2 10*3/uL (ref 0.0–0.7)
Eosinophils Relative: 3.7 % (ref 0.0–5.0)
HCT: 45.3 % (ref 39.0–52.0)
Hemoglobin: 14.6 g/dL (ref 13.0–17.0)
Lymphocytes Relative: 17.2 % (ref 12.0–46.0)
Lymphs Abs: 1.1 10*3/uL (ref 0.7–4.0)
MCHC: 32.2 g/dL (ref 30.0–36.0)
MCV: 82.6 fl (ref 78.0–100.0)
Monocytes Absolute: 0.7 10*3/uL (ref 0.1–1.0)
Monocytes Relative: 11.3 % (ref 3.0–12.0)
Neutro Abs: 4.4 10*3/uL (ref 1.4–7.7)
Neutrophils Relative %: 66.1 % (ref 43.0–77.0)
Platelets: 218 10*3/uL (ref 150.0–400.0)
RBC: 5.48 Mil/uL (ref 4.22–5.81)
RDW: 14.3 % (ref 11.5–15.5)
WBC: 6.6 10*3/uL (ref 4.0–10.5)

## 2023-07-18 LAB — LIPID PANEL
Cholesterol: 92 mg/dL (ref 0–200)
HDL: 37.8 mg/dL — ABNORMAL LOW (ref >39.00)
LDL Cholesterol: 25 mg/dL (ref 0–99)
NonHDL: 53.97
Total CHOL/HDL Ratio: 2
Triglycerides: 144 mg/dL (ref 0.0–149.0)
VLDL: 28.8 mg/dL (ref 0.0–40.0)

## 2023-07-18 LAB — COMPREHENSIVE METABOLIC PANEL
ALT: 14 U/L (ref 0–53)
AST: 19 U/L (ref 0–37)
Albumin: 4.1 g/dL (ref 3.5–5.2)
Alkaline Phosphatase: 109 U/L (ref 39–117)
BUN: 18 mg/dL (ref 6–23)
CO2: 26 mEq/L (ref 19–32)
Calcium: 9.3 mg/dL (ref 8.4–10.5)
Chloride: 104 mEq/L (ref 96–112)
Creatinine, Ser: 1.44 mg/dL (ref 0.40–1.50)
GFR: 45.85 mL/min — ABNORMAL LOW (ref >60.00)
Glucose, Bld: 179 mg/dL — ABNORMAL HIGH (ref 70–99)
Potassium: 4.4 mEq/L (ref 3.5–5.1)
Sodium: 137 mEq/L (ref 135–145)
Total Bilirubin: 0.6 mg/dL (ref 0.2–1.2)
Total Protein: 6.9 g/dL (ref 6.0–8.3)

## 2023-07-18 LAB — HEMOGLOBIN A1C: Hgb A1c MFr Bld: 8.1 % — ABNORMAL HIGH (ref 4.6–6.5)

## 2023-07-18 LAB — TSH: TSH: 0.51 u[IU]/mL (ref 0.35–5.50)

## 2023-07-18 NOTE — Assessment & Plan Note (Signed)
Recent endoscopy confirmed Hiatal hernia with esophagitis

## 2023-07-18 NOTE — Assessment & Plan Note (Signed)
Well controlled, no changes to meds. Encouraged heart healthy diet such as the DASH diet and exercise as tolerated.  °

## 2023-07-18 NOTE — Assessment & Plan Note (Signed)
Avoid offending foods, start probiotics. Do not eat large meals in late evening and consider raising head of bed.  

## 2023-07-18 NOTE — Progress Notes (Unsigned)
Subjective:    Patient ID: Jack Wade, male    DOB: 1941/01/30, 82 y.o.   MRN: [id number]  No chief complaint on file.   HPI Discussed the use of AI scribe software for clinical note transcription with the patient, who gave verbal consent to proceed.  History of Present Illness     Patient is an 78 you male in today for follow up on chronic medical concerns. No recent febrile illness or hospitalizations. Denies CP/palp/SOB/HA/congestion/fevers/GI or GU c/o. Taking meds as prescribed        Past Medical History:  Diagnosis Date  . Aortic calcification (HCC) 02/09/2015  . Appendicitis   . Back pain 02/09/2015  . CAD (coronary artery disease)    2 stents  . CAD, multiple vessel 05/17/2016   CABG x6 vessel  . Cataract    bilateral  . Chicken pox   . Diabetes mellitus type 2 in obese (HCC) 04/11/2016  . Diabetes type 2, controlled (HCC)   . Dysphagia, unspecified(787.20) 03/20/2014  . Hereditary and idiopathic peripheral neuropathy 02/09/2015  . Hyperlipidemia   . Hypertension   . Hypothyroidism   . Light headedness 07/04/2017  . Measles   . Medicare annual wellness visit, subsequent 06/14/2015  . Melanoma (HCC)    Scalp. 2012  . Mumps as a child  . Thyroid disease    [place] Hyperthyroidism    Past Surgical History:  Procedure Laterality Date  . APPENDECTOMY    . BACK SURGERY     x2 lumbar  . BACK SURGERY     late 1990s had 2 surgeries, first surgery lifted a heavy engine ruptured disds, at L4 and L5, cleaned discs no hardware very helpful. 2 years later while recovering from angiogram with weights applied to left leg caused a recurrence and required surgery again in same area with disc repair  . BIOPSY  12/13/2022   Procedure: BIOPSY;  Surgeon: Lynann Bologna, MD;  Location: [facility];  Service: Gastroenterology;;  . CARDIAC CATHETERIZATION    . CARDIAC CATHETERIZATION N/A 05/11/2016   Procedure: Left Heart Cath and Coronary Angiography;  Surgeon:  Lyn Records, MD;  Location: [facility] INVASIVE CV LAB;  Service: Cardiovascular;  Laterality: N/A;  . CARDIAC CATHETERIZATION N/A 05/11/2016   Procedure: Intravascular Pressure Wire/FFR Study;  Surgeon: Lyn Records, MD;  Location: [facility] INVASIVE CV LAB;  Service: Cardiovascular;  Laterality: N/A;  . COLONOSCOPY WITH PROPOFOL N/A 12/13/2022   Procedure: COLONOSCOPY WITH PROPOFOL;  Surgeon: Lynann Bologna, MD;  Location: [facility];  Service: Gastroenterology;  Laterality: N/A;  . CORONARY ANGIOPLASTY WITH STENT PLACEMENT     2 stents  . CORONARY ARTERY BYPASS GRAFT N/A 05/17/2016   Procedure: CORONARY ARTERY BYPASS GRAFTING (CABG) x 6 using left mammory artery and right greater saphenous vein harvested endoscopically. [place]ima to LAD, SVG to Diagonal, SVG Sequential to OM2 and ramus intermediate, SVG Sequential to RCA and PDA;  Surgeon: Delight Ovens, MD;  Location: [facility] OR;  Service: Open Heart Surgery;  Laterality: N/A;  . ESOPHAGOGASTRODUODENOSCOPY (EGD) WITH PROPOFOL N/A 12/13/2022   Procedure: ESOPHAGOGASTRODUODENOSCOPY (EGD) WITH PROPOFOL;  Surgeon: Lynann Bologna, MD;  Location: [facility];  Service: Gastroenterology;  Laterality: N/A;  . KNEE ARTHROSCOPY     right knee  . LUMBAR LAMINECTOMY/DECOMPRESSION MICRODISCECTOMY Bilateral 04/10/2015   Procedure: LUMBAR LAMINECTOMY/DECOMPRESSION MICRODISCECTOMY 2 LEVELS;  Surgeon: Coletta Memos, MD;  Location: [facility];  Service: Neurosurgery;  Laterality: Bilateral;  Bilateral L45 L5S1 Laminectomy and Foraminotomy  . MALONEY DILATION  12/13/2022   Procedure: Elease Hashimoto DILATION;  Surgeon: Lynann Bologna, MD;  Location: [facility];  Service: Gastroenterology;;  . POLYPECTOMY  12/13/2022   Procedure: POLYPECTOMY;  Surgeon: Lynann Bologna, MD;  Location: [facility];  Service: Gastroenterology;;  . Mora Appl CUFF REPAIR Right   . SKIN CANCER EXCISION     melanoma on scalp  . TEE WITHOUT CARDIOVERSION N/A 05/17/2016   Procedure: TRANSESOPHAGEAL ECHOCARDIOGRAM  (TEE);  Surgeon: Delight Ovens, MD;  Location: [facility] OR;  Service: Open Heart Surgery;  Laterality: N/A;  . TONSILLECTOMY AND ADENOIDECTOMY    . WISDOM TOOTH EXTRACTION      Family History  Problem Relation Age of Onset  . Heart disease Father   . Emphysema Father   . Hypertension Father   . Cancer Father        lung/ healthy  . Heart disease Mother        Deceased  . Cancer Mother        breast  . Hypertension Mother   . Cancer Maternal Grandmother        colon  . Diabetes Brother        type 2  . Heart disease Brother   . Pulmonary embolism Son        vasculiti  . Depression Paternal Grandmother   . Depression Paternal Aunt     Social History   Socioeconomic History  . Marital status: Married    Spouse name: Not on file  . Number of children: 2  . Years of education: Not on file  . Highest education level: Not on file  Occupational History  . Occupation: retired  Tobacco Use  . Smoking status: Former    Current packs/day: 2.00    Average packs/day: 2.0 packs/day for 39.6 years (79.1 ttl pk-yrs)    Types: Cigarettes    Start date: 12/27/1983  . Smokeless tobacco: Never  Vaping Use  . Vaping status: Never Used  Substance and Sexual Activity  . Alcohol use: Yes    Alcohol/week: 0.0 standard drinks of alcohol    Comment: occasionally  . Drug use: No  . Sexual activity: Never    Comment: lives with wife. retired, no dietary restrictions, Public relations account executive.   Other Topics Concern  . Not on file  Social History Narrative  . Not on file   Social Determinants of Health   Financial Resource Strain: Not on file  Food Insecurity: Not on file  Transportation Needs: Not on file  Physical Activity: Not on file  Stress: Not on file  Social Connections: Not on file  Intimate Partner Violence: Not on file    Outpatient Medications Prior to Visit  Medication Sig Dispense Refill  . aspirin 81 MG tablet Take 81 mg by mouth daily.    Marland Kitchen atorvastatin (LIPITOR) 80 MG tablet  TAKE 1 TABLET BY MOUTH EVERYDAY AT BEDTIME 90 tablet 3  . Blood Glucose Monitoring Suppl (ONETOUCH VERIO FLEX SYSTEM) w/Device KIT USE TO CHECK BLOOD SUGAR ONCE A DAY AND AS NEEDED. DX CODE: E11.8 1 kit 0  . dapagliflozin propanediol (FARXIGA) 5 MG TABS tablet Take 1 tablet (5 mg total) by mouth daily before breakfast. 30 tablet 6  . glipiZIDE (GLUCOTROL) 5 MG tablet Take 2 tablets (10 mg total) by mouth daily before breakfast AND 1 tablet (5 mg total) daily before supper. 1 tablet before breakfast and supper. 270 tablet 3  . levothyroxine (SYNTHROID) 112 MCG tablet TAKE 1 TABLET BY MOUTH EVERY DAY (Patient taking differently:  Take 112 mcg by mouth daily before breakfast.) 90 tablet 3  . metFORMIN (GLUCOPHAGE-XR) 500 MG 24 hr tablet Take 1 tablet (500 mg total) by mouth in the morning and at bedtime. 180 tablet 1  . OneTouch Delica Lancets 30G MISC 1 Device by Does not apply route daily in the afternoon. 100 each 3  . ONETOUCH VERIO test strip USE TO CHECK BLOOD SUGAR ONCE A DAY AND AS NEEDED. DX CODE: E11.8 100 strip 1  . pantoprazole (PROTONIX) 40 MG tablet Take 1 tablet (40 mg total) by mouth daily. Take 30 minutes before breakfast. 90 tablet 2   Facility-Administered Medications Prior to Visit  Medication Dose Route Frequency Provider Last Rate Last Admin  . 0.9 %  sodium chloride infusion  500 mL Intravenous Once Lynann Bologna, MD        No Known Allergies  Review of Systems  Constitutional:  Negative for fever and malaise/fatigue.  HENT:  Negative for congestion.   Eyes:  Negative for blurred vision.  Respiratory:  Negative for shortness of breath.   Cardiovascular:  Negative for chest pain, palpitations and leg swelling.  Gastrointestinal:  Positive for heartburn. Negative for abdominal pain, blood in stool and nausea.  Genitourinary:  Negative for dysuria and frequency.  Musculoskeletal:  Positive for back pain. Negative for falls.  Skin:  Negative for rash.  Neurological:   Negative for dizziness, loss of consciousness and headaches.  Endo/Heme/Allergies:  Negative for environmental allergies.  Psychiatric/Behavioral:  Negative for depression. The patient is not nervous/anxious.       Objective:    Physical Exam Vitals reviewed.  Constitutional:      Appearance: Normal appearance. He is not ill-appearing.  HENT:     Head: Normocephalic and atraumatic.     Nose: Nose normal.  Eyes:     Conjunctiva/sclera: Conjunctivae normal.  Cardiovascular:     Rate and Rhythm: Normal rate.     Pulses: Normal pulses.     Heart sounds: Normal heart sounds. No murmur heard. Pulmonary:     Effort: Pulmonary effort is normal.     Breath sounds: Normal breath sounds. No wheezing.  Abdominal:     Palpations: Abdomen is soft. There is no mass.     Tenderness: There is no abdominal tenderness.  Musculoskeletal:     Cervical back: Normal range of motion.     Right lower leg: No edema.     Left lower leg: No edema.  Skin:    General: Skin is warm and dry.  Neurological:     General: No focal deficit present.     Mental Status: He is alert and oriented to person, place, and time.  Psychiatric:        Mood and Affect: Mood normal.   There were no vitals taken for this visit. Wt Readings from Last 3 Encounters:  03/07/23 191 lb (86.6 kg)  12/13/22 186 lb (84.4 kg)  10/20/22 185 lb (83.9 kg)    Diabetic Foot Exam - Simple   No data filed    Lab Results  Component Value Date   WBC 9.5 03/15/2022   HGB 15.6 03/15/2022   HCT 46.0 03/15/2022   PLT 233 03/15/2022   GLUCOSE 169 (H) 03/07/2023   CHOL 102 03/07/2023   TRIG 167.0 (H) 03/07/2023   HDL 39.00 (L) 03/07/2023   LDLDIRECT 43.0 04/11/2016   LDLCALC 30 03/07/2023   ALT 15 03/07/2023   AST 19 03/07/2023   NA 137 03/07/2023  K 3.8 03/07/2023   CL 104 03/07/2023   CREATININE 1.35 03/07/2023   BUN 19 03/07/2023   CO2 23 03/07/2023   TSH 2.33 10/19/2021   PSA 0.91 09/24/2020   INR 1.61 (H)  05/17/2016   HGBA1C 7.7 (A) 03/07/2023   MICROALBUR <0.7 03/07/2023    Lab Results  Component Value Date   TSH 2.33 10/19/2021   Lab Results  Component Value Date   WBC 9.5 03/15/2022   HGB 15.6 03/15/2022   HCT 46.0 03/15/2022   MCV 83.3 03/15/2022   PLT 233 03/15/2022   Lab Results  Component Value Date   NA 137 03/07/2023   K 3.8 03/07/2023   CO2 23 03/07/2023   GLUCOSE 169 (H) 03/07/2023   BUN 19 03/07/2023   CREATININE 1.35 03/07/2023   BILITOT 0.7 03/07/2023   ALKPHOS 103 03/07/2023   AST 19 03/07/2023   ALT 15 03/07/2023   PROT 7.0 03/07/2023   ALBUMIN 4.1 03/07/2023   CALCIUM 9.6 03/07/2023   ANIONGAP 10 03/15/2022   GFR 49.67 (L) 03/07/2023   Lab Results  Component Value Date   CHOL 102 03/07/2023   Lab Results  Component Value Date   HDL 39.00 (L) 03/07/2023   Lab Results  Component Value Date   LDLCALC 30 03/07/2023   Lab Results  Component Value Date   TRIG 167.0 (H) 03/07/2023   Lab Results  Component Value Date   CHOLHDL 3 03/07/2023   Lab Results  Component Value Date   HGBA1C 7.7 (A) 03/07/2023       Assessment & Plan:  Pure hypercholesterolemia  Other specified hypothyroidism Assessment & Plan: On Levothyroxine, continue to monitor   Primary hypertension Assessment & Plan: Well controlled, no changes to meds. Encouraged heart healthy diet such as the DASH diet and exercise as tolerated.    Type 2 diabetes with complication [facility]) Assessment & Plan: hgba1c acceptable at 7.9, minimize simple carbs. Increase exercise as tolerated. Continue current meds for now   Stage 3 chronic kidney disease, unspecified whether stage 3a or 3b CKD (HCC) Assessment & Plan: Hydrate and monitor   Dysphagia, unspecified type Assessment & Plan: Recent endoscopy confirmed Hiatal hernia with esophagitis   Hiatal hernia with GERD Assessment & Plan: Avoid offending foods, start probiotics. Do not eat large meals in late evening and  consider raising head of bed.       Assessment and Plan              Danise Edge, MD

## 2023-07-18 NOTE — Assessment & Plan Note (Signed)
hgba1c acceptable at 7.9, minimize simple carbs. Increase exercise as tolerated. Continue current meds for now

## 2023-07-18 NOTE — Assessment & Plan Note (Signed)
Hydrate and monitor 

## 2023-07-18 NOTE — Patient Instructions (Addendum)
RSV, Respiratory Syncitial Virus Vaccine, Arexvy at pharmacy   Hypertension, Adult High blood pressure (hypertension) is when the force of blood pumping through the arteries is too strong. The arteries are the blood vessels that carry blood from the heart throughout the body. Hypertension forces the heart to work harder to pump blood and may cause arteries to become narrow or stiff. Untreated or uncontrolled hypertension can lead to a heart attack, heart failure, a stroke, kidney disease, and other problems. A blood pressure reading consists of a higher number over a lower number. Ideally, your blood pressure should be below 120/80. The first ("top") number is called the systolic pressure. It is a measure of the pressure in your arteries as your heart beats. The second ("bottom") number is called the diastolic pressure. It is a measure of the pressure in your arteries as the heart relaxes. What are the causes? The exact cause of this condition is not known. There are some conditions that result in high blood pressure. What increases the risk? Certain factors may make you more likely to develop high blood pressure. Some of these risk factors are under your control, including: Smoking. Not getting enough exercise or physical activity. Being overweight. Having too much fat, sugar, calories, or salt (sodium) in your diet. Drinking too much alcohol. Other risk factors include: Having a personal history of heart disease, diabetes, high cholesterol, or kidney disease. Stress. Having a family history of high blood pressure and high cholesterol. Having obstructive sleep apnea. Age. The risk increases with age. What are the signs or symptoms? High blood pressure may not cause symptoms. Very high blood pressure (hypertensive crisis) may cause: Headache. Fast or irregular heartbeats (palpitations). Shortness of breath. Nosebleed. Nausea and vomiting. Vision changes. Severe chest pain, dizziness, and  seizures. How is this diagnosed? This condition is diagnosed by measuring your blood pressure while you are seated, with your arm resting on a flat surface, your legs uncrossed, and your feet flat on the floor. The cuff of the blood pressure monitor will be placed directly against the skin of your upper arm at the level of your heart. Blood pressure should be measured at least twice using the same arm. Certain conditions can cause a difference in blood pressure between your right and left arms. If you have a high blood pressure reading during one visit or you have normal blood pressure with other risk factors, you may be asked to: Return on a different day to have your blood pressure checked again. Monitor your blood pressure at home for 1 week or longer. If you are diagnosed with hypertension, you may have other blood or imaging tests to help your health care provider understand your overall risk for other conditions. How is this treated? This condition is treated by making healthy lifestyle changes, such as eating healthy foods, exercising more, and reducing your alcohol intake. You may be referred for counseling on a healthy diet and physical activity. Your health care provider may prescribe medicine if lifestyle changes are not enough to get your blood pressure under control and if: Your systolic blood pressure is above 130. Your diastolic blood pressure is above 80. Your personal target blood pressure may vary depending on your medical conditions, your age, and other factors. Follow these instructions at home: Eating and drinking  Eat a diet that is high in fiber and potassium, and low in sodium, added sugar, and fat. An example of this eating plan is called the DASH diet. DASH stands for  Dietary Approaches to Stop Hypertension. To eat this way: Eat plenty of fresh fruits and vegetables. Try to fill one half of your plate at each meal with fruits and vegetables. Eat whole grains, such as  whole-wheat pasta, brown rice, or whole-grain bread. Fill about one fourth of your plate with whole grains. Eat or drink low-fat dairy products, such as skim milk or low-fat yogurt. Avoid fatty cuts of meat, processed or cured meats, and poultry with skin. Fill about one fourth of your plate with lean proteins, such as fish, chicken without skin, beans, eggs, or tofu. Avoid pre-made and processed foods. These tend to be higher in sodium, added sugar, and fat. Reduce your daily sodium intake. Many people with hypertension should eat less than 1,500 mg of sodium a day. Do not drink alcohol if: Your health care provider tells you not to drink. You are pregnant, may be pregnant, or are planning to become pregnant. If you drink alcohol: Limit how much you have to: 0-1 drink a day for women. 0-2 drinks a day for men. Know how much alcohol is in your drink. In the [place] one drink equals one 12 oz bottle of beer (355 mL), one 5 oz glass of wine (148 mL), or one 1 oz glass of hard liquor (44 mL). Lifestyle  Work with your health care provider to maintain a healthy body weight or to lose weight. Ask what an ideal weight is for you. Get at least 30 minutes of exercise that causes your heart to beat faster (aerobic exercise) most days of the week. Activities may include walking, swimming, or biking. Include exercise to strengthen your muscles (resistance exercise), such as Pilates or lifting weights, as part of your weekly exercise routine. Try to do these types of exercises for 30 minutes at least 3 days a week. Do not use any products that contain nicotine or tobacco. These products include cigarettes, chewing tobacco, and vaping devices, such as e-cigarettes. If you need help quitting, ask your health care provider. Monitor your blood pressure at home as told by your health care provider. Keep all follow-up visits. This is important. Medicines Take over-the-counter and prescription medicines only as  told by your health care provider. Follow directions carefully. Blood pressure medicines must be taken as prescribed. Do not skip doses of blood pressure medicine. Doing this puts you at risk for problems and can make the medicine less effective. Ask your health care provider about side effects or reactions to medicines that you should watch for. Contact a health care provider if you: Think you are having a reaction to a medicine you are taking. Have headaches that keep coming back (recurring). Feel dizzy. Have swelling in your ankles. Have trouble with your vision. Get help right away if you: Develop a severe headache or confusion. Have unusual weakness or numbness. Feel faint. Have severe pain in your chest or abdomen. Vomit repeatedly. Have trouble breathing. These symptoms may be an emergency. Get help right away. Call 911. Do not wait to see if the symptoms will go away. Do not drive yourself to the hospital. Summary Hypertension is when the force of blood pumping through your arteries is too strong. If this condition is not controlled, it may put you at risk for serious complications. Your personal target blood pressure may vary depending on your medical conditions, your age, and other factors. For most people, a normal blood pressure is less than 120/80. Hypertension is treated with lifestyle changes, medicines, or a combination  of both. Lifestyle changes include losing weight, eating a healthy, low-sodium diet, exercising more, and limiting alcohol. This information is not intended to replace advice given to you by your health care provider. Make sure you discuss any questions you have with your health care provider. Document Revised: 10/19/2021 Document Reviewed: 10/19/2021 Elsevier Patient Education  2024 [employer].

## 2023-07-18 NOTE — Assessment & Plan Note (Signed)
On Levothyroxine, continue to monitor 

## 2023-07-22 ENCOUNTER — Other Ambulatory Visit: Payer: Self-pay | Admitting: Internal Medicine

## 2023-08-17 NOTE — Progress Notes (Signed)
HPI: FU coronary artery disease. Lower extremity Dopplers February 2016 showed an occluded right anterior tibial artery. Abdominal ultrasound February 2016 showed no aneurysm. Patient had cardiac catheterization May 2017 secondary to continued exertional chest pain. He was found to have severe three-vessel coronary artery disease and normal LV function. Echocardiogram showed normal LV systolic function, grade 1 diastolic dysfunction and mildly dilated aortic root. Note carotid Dopplers May 2017 prior to surgery showed 1-39% bilateral stenosis. Patient had coronary artery bypass and graft with LIMA to the LAD, saphenous vein graft to second diagonal, sequential saphenous vein graft to the intermediate and distal circumflex, and sequential saphenous vein graft to the right coronary artery and PDA. Since he was last seen, patient denies dyspnea, chest pain or syncope.  Occasional dizziness with standing which is unchanged.  He also notes discoloration in his right foot occasionally and is concerned about peripheral vascular disease given his diabetes mellitus.  Current Outpatient Medications  Medication Sig Dispense Refill   aspirin 81 MG tablet Take 81 mg by mouth daily.     atorvastatin (LIPITOR) 80 MG tablet TAKE 1 TABLET BY MOUTH EVERYDAY AT BEDTIME 90 tablet 3   Blood Glucose Monitoring Suppl (ONETOUCH VERIO FLEX SYSTEM) w/Device KIT USE TO CHECK BLOOD SUGAR ONCE A DAY AND AS NEEDED. DX CODE: E11.8 1 kit 0   dapagliflozin propanediol (FARXIGA) 5 MG TABS tablet Take 1 tablet (5 mg total) by mouth daily before breakfast. 30 tablet 6   glipiZIDE (GLUCOTROL) 5 MG tablet Take 2 tablets (10 mg total) by mouth daily before breakfast AND 1 tablet (5 mg total) daily before supper. 1 tablet before breakfast and supper. (Patient taking differently: Take 2 tablets (10 mg total) by mouth daily before breakfast AND 1 tablet (5 mg total) daily before supper. ) 270 tablet 3   levothyroxine (SYNTHROID) 112 MCG tablet  TAKE 1 TABLET BY MOUTH EVERY DAY (Patient taking differently: Take 112 mcg by mouth daily before breakfast.) 90 tablet 3   metFORMIN (GLUCOPHAGE-XR) 500 MG 24 hr tablet Take 1 tablet (500 mg total) by mouth in the morning and at bedtime. 180 tablet 1   OneTouch Delica Lancets 30G MISC 1 Device by Does not apply route daily in the afternoon. 100 each 3   ONETOUCH VERIO test strip USE TO CHECK BLOOD SUGAR ONCE A DAY AND AS NEEDED. DX CODE: E11.8 100 strip 1   pantoprazole (PROTONIX) 40 MG tablet Take 1 tablet (40 mg total) by mouth daily. Take 30 minutes before breakfast. 90 tablet 2   Current Facility-Administered Medications  Medication Dose Route Frequency Provider Last Rate Last Admin   0.9 %  sodium chloride infusion  500 mL Intravenous Once Lynann Bologna, MD         Past Medical History:  Diagnosis Date   Aortic calcification (HCC) 02/09/2015   Appendicitis    Back pain 02/09/2015   CAD (coronary artery disease)    2 stents   CAD, multiple vessel 05/17/2016   CABG x6 vessel   Cataract    bilateral   Chicken pox    Diabetes mellitus type 2 in obese 04/11/2016   Diabetes type 2, controlled (HCC)    Dysphagia, unspecified(787.20) 03/20/2014   Hereditary and idiopathic peripheral neuropathy 02/09/2015   Hyperlipidemia    Hypertension    Hypothyroidism    Light headedness 07/04/2017   Measles    Medicare annual wellness visit, subsequent 06/14/2015   Melanoma (HCC)    Scalp. 2012   Mumps  as a child   Thyroid disease    [place] Hyperthyroidism    Past Surgical History:  Procedure Laterality Date   APPENDECTOMY     BACK SURGERY     x2 lumbar   BACK SURGERY     late 1990s had 2 surgeries, first surgery lifted a heavy engine ruptured disds, at L4 and L5, cleaned discs no hardware very helpful. 2 years later while recovering from angiogram with weights applied to left leg caused a recurrence and required surgery again in same area with disc repair   BIOPSY  12/13/2022    Procedure: BIOPSY;  Surgeon: Lynann Bologna, MD;  Location: [facility];  Service: Gastroenterology;;   CARDIAC CATHETERIZATION     CARDIAC CATHETERIZATION N/A 05/11/2016   Procedure: Left Heart Cath and Coronary Angiography;  Surgeon: Lyn Records, MD;  Location[facility] INVASIVE CV LAB;  Service: Cardiovascular;  Laterality: N/A;   CARDIAC CATHETERIZATION N/A 05/11/2016   Procedure: Intravascular Pressure Wire/FFR Study;  Surgeon: Lyn Records, MD;  Location: [facility] INVASIVE CV LAB;  Service: Cardiovascular;  Laterality: N/A;   COLONOSCOPY WITH PROPOFOL N/A 12/13/2022   Procedure: COLONOSCOPY WITH PROPOFOL;  Surgeon: Lynann Bologna, MD;  Location: [facility];  Service: Gastroenterology;  Laterality: N/A;   CORONARY ANGIOPLASTY WITH STENT PLACEMENT     2 stents   CORONARY ARTERY BYPASS GRAFT N/A 05/17/2016   Procedure: CORONARY ARTERY BYPASS GRAFTING (CABG) x 6 using left mammory artery and right greater saphenous vein harvested endoscopically. [place] to LAD, SVG to Diagonal, SVG Sequential to OM2 and ramus intermediate, SVG Sequential to RCA and PDA;  Surgeon: Delight Ovens, MD;  Location: [facility] OR;  Service: Open Heart Surgery;  Laterality: N/A;   ESOPHAGOGASTRODUODENOSCOPY (EGD) WITH PROPOFOL N/A 12/13/2022   Procedure: ESOPHAGOGASTRODUODENOSCOPY (EGD) WITH PROPOFOL;  Surgeon: Lynann Bologna, MD;  Location: [facility];  Service: Gastroenterology;  Laterality: N/A;   KNEE ARTHROSCOPY     right knee   LUMBAR LAMINECTOMY/DECOMPRESSION MICRODISCECTOMY Bilateral 04/10/2015   Procedure: LUMBAR LAMINECTOMY/DECOMPRESSION MICRODISCECTOMY 2 LEVELS;  Surgeon: Coletta Memos, MD;  Location: [facility];  Service: Neurosurgery;  Laterality: Bilateral;  Bilateral L45 L5S1 Laminectomy and Foraminotomy   MALONEY DILATION  12/13/2022   Procedure: MALONEY DILATION;  Surgeon: Lynann Bologna, MD;  Location: [facility];  Service: Gastroenterology;;   POLYPECTOMY  12/13/2022   Procedure: POLYPECTOMY;  Surgeon: Lynann Bologna, MD;  Location: [facility];  Service: Gastroenterology;;   ROTATOR CUFF REPAIR Right    SKIN CANCER EXCISION     melanoma on scalp   TEE WITHOUT CARDIOVERSION N/A 05/17/2016   Procedure: TRANSESOPHAGEAL ECHOCARDIOGRAM (TEE);  Surgeon: Delight Ovens, MD;  Location: [facility] OR;  Service: Open Heart Surgery;  Laterality: N/A;   TONSILLECTOMY AND ADENOIDECTOMY     WISDOM TOOTH EXTRACTION      Social History   Socioeconomic History   Marital status: Married    Spouse name: Not on file   Number of children: 2   Years of education: Not on file   Highest education level: Not on file  Occupational History   Occupation: retired  Tobacco Use   Smoking status: Former    Current packs/day: 2.00    Average packs/day: 2.0 packs/day for 39.7 years (79.3 ttl pk-yrs)    Types: Cigarettes    Start date: 12/27/1983   Smokeless tobacco: Never  Vaping Use   Vaping status: Never Used  Substance and Sexual Activity   Alcohol use: Yes    Alcohol/week: 0.0 standard drinks  of alcohol    Comment: occasionally   Drug use: No   Sexual activity: Never    Comment: lives with wife. retired, no dietary restrictions, Public relations account executive.   Other Topics Concern   Not on file  Social History Narrative   Not on file   Social Determinants of Health   Financial Resource Strain: Not on file  Food Insecurity: Not on file  Transportation Needs: Not on file  Physical Activity: Not on file  Stress: Not on file  Social Connections: Not on file  Intimate Partner Violence: Not on file    Family History  Problem Relation Age of Onset   Heart disease Father    Emphysema Father    Hypertension Father    Cancer Father        lung/ healthy   Heart disease Mother        Deceased   Cancer Mother        breast   Hypertension Mother    Cancer Maternal Grandmother        colon   Diabetes Brother        type 2   Heart disease Brother    Pulmonary embolism Son        vasculiti   Depression Paternal  Grandmother    Depression Paternal Aunt     ROS: no fevers or chills, productive cough, hemoptysis, dysphasia, odynophagia, melena, hematochezia, dysuria, hematuria, rash, seizure activity, orthopnea, PND, pedal edema, claudication. Remaining systems are negative.  Physical Exam: Well-developed well-nourished in no acute distress.  Skin is warm and dry.  HEENT is normal.  Neck is supple.  Chest is clear to auscultation with normal expansion.  Cardiovascular exam is regular rate and rhythm.  Abdominal exam nontender or distended. No masses palpated. Extremities show no edema. neuro grossly intact  Electrocardiogram today personally reviewed and shows sinus rhythm with occasional PVC, first-degree AV block, right bundle branch block, left anterior fascicular block.   A/P  1 coronary artery disease-continue aspirin and statin.  Patient denies chest pain.  2 hyperlipidemia-continue statin.  3 peripheral vascular disease-Continue medical therapy with aspirin and statin.  Patient has occasional erythema in his right foot and is concerned about progression of disease.  Will arrange ABIs with Doppler.  He is diabetic.  4 orthostasis-patient has had no recent symptoms.  Continue hydration.  Olga Millers, MD

## 2023-08-22 DIAGNOSIS — D1801 Hemangioma of skin and subcutaneous tissue: Secondary | ICD-10-CM | POA: Diagnosis not present

## 2023-08-22 DIAGNOSIS — D692 Other nonthrombocytopenic purpura: Secondary | ICD-10-CM | POA: Diagnosis not present

## 2023-08-22 DIAGNOSIS — Z129 Encounter for screening for malignant neoplasm, site unspecified: Secondary | ICD-10-CM | POA: Diagnosis not present

## 2023-08-22 DIAGNOSIS — Z85820 Personal history of malignant melanoma of skin: Secondary | ICD-10-CM | POA: Diagnosis not present

## 2023-08-22 DIAGNOSIS — L570 Actinic keratosis: Secondary | ICD-10-CM | POA: Diagnosis not present

## 2023-08-22 DIAGNOSIS — L814 Other melanin hyperpigmentation: Secondary | ICD-10-CM | POA: Diagnosis not present

## 2023-08-23 ENCOUNTER — Encounter: Payer: Self-pay | Admitting: Cardiology

## 2023-08-23 ENCOUNTER — Ambulatory Visit: Payer: Medicare Other | Attending: Cardiology | Admitting: Cardiology

## 2023-08-23 VITALS — BP 126/73 | HR 67 | Ht 72.0 in | Wt 190.4 lb

## 2023-08-23 DIAGNOSIS — R9431 Abnormal electrocardiogram [ECG] [EKG]: Secondary | ICD-10-CM | POA: Insufficient documentation

## 2023-08-23 DIAGNOSIS — E7800 Pure hypercholesterolemia, unspecified: Secondary | ICD-10-CM | POA: Diagnosis not present

## 2023-08-23 DIAGNOSIS — I739 Peripheral vascular disease, unspecified: Secondary | ICD-10-CM | POA: Diagnosis not present

## 2023-08-23 DIAGNOSIS — I2510 Atherosclerotic heart disease of native coronary artery without angina pectoris: Secondary | ICD-10-CM | POA: Insufficient documentation

## 2023-08-23 NOTE — Patient Instructions (Signed)
Medication Instructions:   Your physician recommends that you continue on your current medications as directed. Please refer to the Current Medication list given to you today.   *If you need a refill on your cardiac medications before your next appointment, please call your pharmacy*   Lab Work:  None ordered.  If you have labs (blood work) drawn today and your tests are completely normal, you will receive your results only by: MyChart Message (if you have MyChart) OR A paper copy in the mail If you have any lab test that is abnormal or we need to change your treatment, we will call you to review the results.   Testing/Procedures:  Your physician has requested that you have a lower extremity arterial exercise duplex. During this test, exercise and ultrasound are used to evaluate arterial blood flow in the legs. Allow one hour for this exam. There are no restrictions or special instructions. Your physician has requested that you have an ankle brachial index (ABI). During this test an ultrasound and blood pressure cuff are used to evaluate the arteries that supply the arms and legs with blood. Allow thirty minutes for this exam. There are no restrictions or special instructions.    Follow-Up: At [facility], you and your health needs are our priority.  As part of our continuing mission to provide you with exceptional heart care, we have created designated Provider Care Teams.  These Care Teams include your primary Cardiologist (physician) and Advanced Practice Providers (APPs -  Physician Assistants and Nurse Practitioners) who all work together to provide you with the care you need, when you need it.  We recommend signing up for the patient portal called "MyChart".  Sign up information is provided on this After Visit Summary.  MyChart is used to connect with patients for Virtual Visits (Telemedicine).  Patients are able to view lab/test results, encounter notes, upcoming  appointments, etc.  Non-urgent messages can be sent to your provider as well.   To learn more about what you can do with MyChart, go to [email or web address].    Your next appointment:   1 year(s)  Provider:   Olga Millers, MD    Other Instructions  Your physician wants you to follow-up in: 1 year.  You will receive a reminder letter in the mail two months in advance. If you don't receive a letter, please call our office to schedule the follow-up appointment.

## 2023-08-24 ENCOUNTER — Ambulatory Visit (HOSPITAL_BASED_OUTPATIENT_CLINIC_OR_DEPARTMENT_OTHER)
Admission: RE | Admit: 2023-08-24 | Discharge: 2023-08-24 | Disposition: A | Discharge status: Home / Self Care | Payer: Medicare Other | Source: Ambulatory Visit | Attending: Cardiology | Admitting: Cardiology

## 2023-08-24 ENCOUNTER — Other Ambulatory Visit: Payer: Self-pay | Admitting: Cardiology

## 2023-08-24 DIAGNOSIS — I739 Peripheral vascular disease, unspecified: Secondary | ICD-10-CM | POA: Insufficient documentation

## 2023-08-24 DIAGNOSIS — E7800 Pure hypercholesterolemia, unspecified: Secondary | ICD-10-CM

## 2023-08-24 DIAGNOSIS — I2510 Atherosclerotic heart disease of native coronary artery without angina pectoris: Secondary | ICD-10-CM

## 2023-08-24 DIAGNOSIS — R9431 Abnormal electrocardiogram [ECG] [EKG]: Secondary | ICD-10-CM

## 2023-08-25 NOTE — Telephone Encounter (Signed)
Patient's wife is calling requesting a callback regarding these results.   Please advise.

## 2023-09-08 ENCOUNTER — Ambulatory Visit (INDEPENDENT_AMBULATORY_CARE_PROVIDER_SITE_OTHER): Payer: Medicare Other | Admitting: Internal Medicine

## 2023-09-08 ENCOUNTER — Encounter: Payer: Self-pay | Admitting: Internal Medicine

## 2023-09-08 VITALS — BP 118/68 | HR 66 | Ht 72.0 in | Wt 185.4 lb

## 2023-09-08 DIAGNOSIS — E1159 Type 2 diabetes mellitus with other circulatory complications: Secondary | ICD-10-CM

## 2023-09-08 DIAGNOSIS — Z7984 Long term (current) use of oral hypoglycemic drugs: Secondary | ICD-10-CM | POA: Insufficient documentation

## 2023-09-08 DIAGNOSIS — N1831 Chronic kidney disease, stage 3a: Secondary | ICD-10-CM | POA: Diagnosis not present

## 2023-09-08 DIAGNOSIS — E1122 Type 2 diabetes mellitus with diabetic chronic kidney disease: Secondary | ICD-10-CM | POA: Diagnosis not present

## 2023-09-08 DIAGNOSIS — E1142 Type 2 diabetes mellitus with diabetic polyneuropathy: Secondary | ICD-10-CM | POA: Diagnosis not present

## 2023-09-08 DIAGNOSIS — E119 Type 2 diabetes mellitus without complications: Secondary | ICD-10-CM | POA: Insufficient documentation

## 2023-09-08 DIAGNOSIS — E1165 Type 2 diabetes mellitus with hyperglycemia: Secondary | ICD-10-CM

## 2023-09-08 LAB — POCT GLYCOSYLATED HEMOGLOBIN (HGB A1C): Hemoglobin A1C: 7.5 % — AB (ref 4.0–5.6)

## 2023-09-08 LAB — POCT GLUCOSE (DEVICE FOR HOME USE): POC Glucose: 126 mg/dL — AB (ref 70–99)

## 2023-09-08 NOTE — Progress Notes (Signed)
Name: Jack Wade  MRN/ DOB: [id number], [birth year]   Age/ Sex: 82 y.o., male    PCP: Bradd Canary, MD   Reason for Endocrinology Evaluation: Type 2 Diabetes Mellitus     Date of Initial Endocrinology Visit: 10/19/2021    PATIENT IDENTIFIER: Jack Wade is a 82 y.o. male with a past medical history of T2DM, Dyslipidemia and GERD . The patient presented for initial endocrinology clinic visit on 10/19/2021 for consultative assistance with his diabetes management.    HPI: Jack Wade was    Diagnosed with DM years ago  Prior Medications tried/Intolerance: as listed  Currently checking blood sugars occasionally  Hypoglycemia episodes : no               Hemoglobin A1c has ranged from 7.1% in 2019, peaking at 7.9% in 2022. Patient required assistance for hypoglycemia: no Patient has required hospitalization within the last 1 year from hyper or hypoglycemia: no  In terms of diet, the patient eats 3 meals, snacks in the evening . The pt drinks sugar sweetened beverages    SUBJECTIVE:   During the last visit (03/07/2023): A1c 7.7%     Today (09/08/23): Jack Wade  is here for a follow up on diabetes management . Jack Wade  checks his blood sugars occasionally  times daily. The patient has not had hypoglycemic episodes since the last clinic visit.    Follows with Cardiology for CAD Jack Wade is on patient assistance program for Farxiga  Weight has been fluctuating  Has chronic constipation but no  diarrhea  Denies  nausea or vomiting  Patient did scratch his left great toe against the wall, which resulted in bleeding, Jack Wade did not have sensation  HOME DIABETES REGIMEN: Glipizide 5 mg , 2 tablets before breakfast and 1 tablet before supper Metformin 500 mg 1 tabs BID  Farxiga 5 mg daily - through ot assistance       Statin: yes ACE-I/ARB: no    METER DOWNLOAD SUMMARY: unable to download N/a    DIABETIC COMPLICATIONS: Microvascular complications:   CKD III, ? retinopathy ( Right eye - stable ) , neuropathy  Denies: neuropathy  Last eye exam: Completed 08/2022  Macrovascular complications:  CAD , PVD Denies: CVA   PAST HISTORY: Past Medical History:  Past Medical History:  Diagnosis Date   Aortic calcification (HCC) 02/09/2015   Appendicitis    Back pain 02/09/2015   CAD (coronary artery disease)    2 stents   CAD, multiple vessel 05/17/2016   CABG x6 vessel   Cataract    bilateral   Chicken pox    Diabetes mellitus type 2 in obese 04/11/2016   Diabetes type 2, controlled (HCC)    Dysphagia, unspecified(787.20) 03/20/2014   Hereditary and idiopathic peripheral neuropathy 02/09/2015   Hyperlipidemia    Hypertension    Hypothyroidism    Light headedness 07/04/2017   Measles    Medicare annual wellness visit, subsequent 06/14/2015   Melanoma (HCC)    Scalp. 2012   Mumps as a child   Thyroid disease    [place] Hyperthyroidism   Past Surgical History:  Past Surgical History:  Procedure Laterality Date   APPENDECTOMY     BACK SURGERY     x2 lumbar   BACK SURGERY     late 1990s had 2 surgeries, first surgery lifted a heavy engine ruptured disds, at L4 and L5, cleaned discs no hardware very helpful. 2 years later while recovering from angiogram with  weights applied to left leg caused a recurrence and required surgery again in same area with disc repair   BIOPSY  12/13/2022   Procedure: BIOPSY;  Surgeon: Lynann Bologna, MD;  Location: [facility];  Service: Gastroenterology;;   CARDIAC CATHETERIZATION     CARDIAC CATHETERIZATION N/A 05/11/2016   Procedure: Left Heart Cath and Coronary Angiography;  Surgeon: Lyn Records, MD;  Location: [facility] INVASIVE CV LAB;  Service: Cardiovascular;  Laterality: N/A;   CARDIAC CATHETERIZATION N/A 05/11/2016   Procedure: Intravascular Pressure Wire/FFR Study;  Surgeon: Lyn Records, MD;  Location: [facility] INVASIVE CV LAB;  Service: Cardiovascular;  Laterality: N/A;   COLONOSCOPY WITH  PROPOFOL N/A 12/13/2022   Procedure: COLONOSCOPY WITH PROPOFOL;  Surgeon: Lynann Bologna, MD;  Location: [facility];  Service: Gastroenterology;  Laterality: N/A;   CORONARY ANGIOPLASTY WITH STENT PLACEMENT     2 stents   CORONARY ARTERY BYPASS GRAFT N/A 05/17/2016   Procedure: CORONARY ARTERY BYPASS GRAFTING (CABG) x 6 using left mammory artery and right greater saphenous vein harvested endoscopically. [place] to LAD, SVG to Diagonal, SVG Sequential to OM2 and ramus intermediate, SVG Sequential to RCA and PDA;  Surgeon: Delight Ovens, MD;  Location: [facility] OR;  Service: Open Heart Surgery;  Laterality: N/A;   ESOPHAGOGASTRODUODENOSCOPY (EGD) WITH PROPOFOL N/A 12/13/2022   Procedure: ESOPHAGOGASTRODUODENOSCOPY (EGD) WITH PROPOFOL;  Surgeon: Lynann Bologna, MD;  Location: [facility];  Service: Gastroenterology;  Laterality: N/A;   KNEE ARTHROSCOPY     right knee   LUMBAR LAMINECTOMY/DECOMPRESSION MICRODISCECTOMY Bilateral 04/10/2015   Procedure: LUMBAR LAMINECTOMY/DECOMPRESSION MICRODISCECTOMY 2 LEVELS;  Surgeon: Coletta Memos, MD;  Location: [facility];  Service: Neurosurgery;  Laterality: Bilateral;  Bilateral L45 L5S1 Laminectomy and Foraminotomy   MALONEY DILATION  12/13/2022   Procedure: MALONEY DILATION;  Surgeon: Lynann Bologna, MD;  Location: [facility];  Service: Gastroenterology;;   POLYPECTOMY  12/13/2022   Procedure: POLYPECTOMY;  Surgeon: Lynann Bologna, MD;  Location: [facility];  Service: Gastroenterology;;   ROTATOR CUFF REPAIR Right    SKIN CANCER EXCISION     melanoma on scalp   TEE WITHOUT CARDIOVERSION N/A 05/17/2016   Procedure: TRANSESOPHAGEAL ECHOCARDIOGRAM (TEE);  Surgeon: Delight Ovens, MD;  Location: [facility] OR;  Service: Open Heart Surgery;  Laterality: N/A;   TONSILLECTOMY AND ADENOIDECTOMY     WISDOM TOOTH EXTRACTION      Social History:  reports that Jack Wade has quit smoking. His smoking use included cigarettes. Jack Wade started smoking about 39 years ago. Jack Wade has a 79.4  pack-year smoking history. Jack Wade has never used smokeless tobacco. Jack Wade reports current alcohol use. Jack Wade reports that Jack Wade does not use drugs. Family History:  Family History  Problem Relation Age of Onset   Heart disease Father    Emphysema Father    Hypertension Father    Cancer Father        lung/ healthy   Heart disease Mother        Deceased   Cancer Mother        breast   Hypertension Mother    Cancer Maternal Grandmother        colon   Diabetes Brother        type 2   Heart disease Brother    Pulmonary embolism Son        vasculiti   Depression Paternal Grandmother    Depression Paternal Aunt      HOME MEDICATIONS: Allergies as of 09/08/2023   No Known Allergies  Medication List        Accurate as of September 08, 2023  9:47 AM. If you have any questions, ask your nurse or doctor.          aspirin 81 MG tablet Take 81 mg by mouth daily.   atorvastatin 80 MG tablet Commonly known as: LIPITOR TAKE 1 TABLET BY MOUTH EVERYDAY AT BEDTIME   dapagliflozin propanediol 5 MG Tabs tablet Commonly known as: Farxiga Take 1 tablet (5 mg total) by mouth daily before breakfast.   glipiZIDE 5 MG tablet Commonly known as: GLUCOTROL Take 2 tablets (10 mg total) by mouth daily before breakfast AND 1 tablet (5 mg total) daily before supper. 1 tablet before breakfast and supper. What changed: See the new instructions.   levothyroxine 112 MCG tablet Commonly known as: SYNTHROID TAKE 1 TABLET BY MOUTH EVERY DAY What changed: when to take this   metFORMIN 500 MG 24 hr tablet Commonly known as: GLUCOPHAGE-XR Take 1 tablet (500 mg total) by mouth in the morning and at bedtime.   OneTouch Delica Lancets 30G Misc 1 Device by Does not apply route daily in the afternoon.   OneTouch Verio Flex System w/Device Kit USE TO CHECK BLOOD SUGAR ONCE A DAY AND AS NEEDED. DX CODE: E11.8   OneTouch Verio test strip Generic drug: glucose blood USE TO CHECK BLOOD SUGAR ONCE A DAY AND  AS NEEDED. DX CODE: E11.8   pantoprazole 40 MG tablet Commonly known as: PROTONIX Take 1 tablet (40 mg total) by mouth daily. Take 30 minutes before breakfast.         ALLERGIES: No Known Allergies      OBJECTIVE:   VITAL SIGNS: BP 118/68 (BP Location: Left Arm, Patient Position: Sitting, Cuff Size: Small)   Pulse 66   Ht 6' (1.829 m)   Wt 185 lb 6.4 oz (84.1 kg)   BMI 25.14 kg/m    PHYSICAL EXAM:  General: Pt appears well and is in NAD  Neck: General: Supple without adenopathy or carotid bruits. Thyroid: Thyroid size normal.  No goiter or nodules appreciated.   Lungs: Clear with good BS bilat with no rales, rhonchi, or wheezes  Heart: RRR   Extremities:  Lower extremities - No pretibial edema  Neuro: MS is good with appropriate affect, pt is alert and Ox3     DM Foot Exam 09/08/2023   The skin of the feet shows left great toe bandage but no other ulcerations The pedal pulses are undetectable  The sensation is mostly absent to a screening 5.07, 10 gram monofilament bilaterally   DATA REVIEWED:  Lab Results  Component Value Date   HGBA1C 7.5 (A) 09/08/2023   HGBA1C 8.1 (H) 07/18/2023   HGBA1C 7.7 (A) 03/07/2023    Latest Reference Range & Units 07/18/23 10:19  Sodium 135 - 145 mEq/L 137  Potassium 3.5 - 5.1 mEq/L 4.4  Chloride 96 - 112 mEq/L 104  CO2 19 - 32 mEq/L 26  Glucose 70 - 99 mg/dL 440 (H)  BUN 6 - 23 mg/dL 18  Creatinine 3.47 - 4.25 mg/dL 9.56  Calcium 8.4 - 38.7 mg/dL 9.3  Alkaline Phosphatase 39 - 117 U/L 109  Albumin 3.5 - 5.2 g/dL 4.1  AST 0 - 37 U/L 19  ALT 0 - 53 U/L 14  Total Protein 6.0 - 8.3 g/dL 6.9  Total Bilirubin 0.2 - 1.2 mg/dL 0.6  GFR >56.43 mL/min 45.85 (L)    Latest Reference Range & Units 07/18/23 10:19  Total CHOL/HDL Ratio  2  Cholesterol 0 - 200 mg/dL 92  HDL Cholesterol >40.98 mg/dL 11.91 (L)  LDL (calc) 0 - 99 mg/dL 25  NonHDL  47.82  Triglycerides 0.0 - 149.0 mg/dL 956.2  VLDL 0.0 - 13.0 mg/dL 86.5     In  office BG 126 mg/dL        ASSESSMENT / PLAN / RECOMMENDATIONS:   1) Type 2 Diabetes Mellitus, Optimally controlled, With CKD III, Neuropathic, retinopathic and macrovascular complications - Most recent A1c of 7.5 %. Goal A1c < 7.5%.    -A1c at goal 7.5%, this has decreased from 8.1% -Patient declines glycemic agents that would cause weight loss, hence Jack Wade does not want to increase [place] -Due to advanced age Jack Wade is at high risk for developing hypoglycemia, no change at this time -Limited glycemic agents due to CKD III -Jack Wade has been approved through patient assistance program to receive Farxiga -We discussed the importance of avoiding snacks, we discussed low-carb snacks if necessary especially at night, I did explain to the patient the importance of optimizing glucose control to reduce any further damage to his kidneys as well as neuropathy -GFR remains at or above 45, no need to change metformin dose at this time     MEDICATIONS: Continue Farxiga 5 mg 1 tablet daily Continue glipizide 5 mg, 2 tablet before breakfast and 1 tablet before Supper Continue metformin 500 mg 1 tablet twice a day  EDUCATION / INSTRUCTIONS: BG monitoring instructions: Patient is instructed to check his blood sugars 1 times a day, fasting. Call [facility] if: BG persistently < 70  I reviewed the Rule of 15 for the treatment of hypoglycemia in detail with the patient. Literature supplied.   2) Diabetic complications:  Eye: Does  have known diabetic retinopathy.  Neuro/ Feet: Does  have known diabetic peripheral neuropathy. Renal: Patient does  have known baseline CKD. Jack Wade is not on an ACEI/ARB at present.   3) Dyslipidemia:  -On his most recent lipid panel has TG and LDL were at goal -Patient on atorvastatin 80 mg daily    Medication Continue atorvastatin 80 mg daily   4) CKD III:  -Patient not on ACEi, no ARB due to borderline low BP and risk of hypotension -Will continue to  monitor  5) Peripheral neuropathy :  -Patient was advised to continue to monitor left great wound and notify us with any signs of infection -I did advise the patient that due to neuropathy and mostly absent monofilament sensation, the patient will need to visually inspect his feet daily, to make sure there are no cuts or wounds in his feet, as this could prevent amputations in the future  Follow-up in 4 months  I spent 30 minutes preparing to see the patient by review of recent labs, imaging and procedures, obtaining and reviewing separately obtained history, communicating with the patient/family or caregiver, ordering medications, tests or procedures, and documenting clinical information in the EHR including the differential Dx, treatment, and any further evaluation and other management     Signed electronically by: Lyndle Herrlich, MD  [facility]  [facility] [address] [place] [place], [place] [address] Phone: [phone] FAX: [phone]   CC: Bradd Canary, MD [address] [place] [address] Phone: [phone]  Fax: [phone]    Return to Endocrinology clinic as below: Future Appointments  Date Time Provider Department Center  01/10/2024  9:50 AM Binnie Vonderhaar, Konrad Dolores,  MD [id number] None  01/18/2024  8:20 AM Bradd Canary, MD [facility]

## 2023-09-08 NOTE — Patient Instructions (Addendum)
Continue  Glipizide 5 mg, TWO tablets before Breakfast and 1 tablet before Supper  Continue Farxiga 5 mg, 1 tablet in the morning  Continue Metformin 500 mg ,1 tablet twice a day   HOW TO TREAT LOW BLOOD SUGARS (Blood sugar LESS THAN 70 MG/DL) Please follow the RULE OF 15 for the treatment of hypoglycemia treatment (when your (blood sugars are less than 70 mg/dL)   STEP 1: Take 15 grams of carbohydrates when your blood sugar is low, which includes:  3-4 GLUCOSE TABS  OR 3-4 OZ OF JUICE OR REGULAR SODA OR ONE TUBE OF GLUCOSE GEL    STEP 2: RECHECK blood sugar in 15 MINUTES STEP 3: If your blood sugar is still low at the 15 minute recheck --> then, go back to STEP 1 and treat AGAIN with another 15 grams of carbohydrates.

## 2023-09-11 ENCOUNTER — Other Ambulatory Visit: Payer: Self-pay | Admitting: Nurse Practitioner

## 2023-09-14 LAB — VAS US ABI WITH/WO TBI
Left ABI: 1.36
Right ABI: 1.16

## 2023-09-26 DIAGNOSIS — Z23 Encounter for immunization: Secondary | ICD-10-CM | POA: Diagnosis not present

## 2023-09-27 ENCOUNTER — Other Ambulatory Visit: Payer: Self-pay | Admitting: Internal Medicine

## 2023-09-27 DIAGNOSIS — E118 Type 2 diabetes mellitus with unspecified complications: Secondary | ICD-10-CM

## 2023-10-25 DIAGNOSIS — H11043 Peripheral pterygium, stationary, bilateral: Secondary | ICD-10-CM | POA: Diagnosis not present

## 2023-10-25 DIAGNOSIS — H04123 Dry eye syndrome of bilateral lacrimal glands: Secondary | ICD-10-CM | POA: Diagnosis not present

## 2023-10-25 DIAGNOSIS — E119 Type 2 diabetes mellitus without complications: Secondary | ICD-10-CM | POA: Diagnosis not present

## 2023-10-25 DIAGNOSIS — H02055 Trichiasis without entropian left lower eyelid: Secondary | ICD-10-CM | POA: Diagnosis not present

## 2023-10-25 LAB — HM DIABETES EYE EXAM

## 2023-11-02 ENCOUNTER — Other Ambulatory Visit: Payer: Self-pay

## 2023-11-02 MED ORDER — DAPAGLIFLOZIN PROPANEDIOL 5 MG PO TABS: 5.0000 mg | ORAL_TABLET | Freq: Every day | ORAL | 3 refills | Status: DC

## 2023-11-14 ENCOUNTER — Other Ambulatory Visit: Payer: Self-pay | Admitting: Internal Medicine

## 2023-12-13 DIAGNOSIS — H02055 Trichiasis without entropian left lower eyelid: Secondary | ICD-10-CM | POA: Diagnosis not present

## 2023-12-24 ENCOUNTER — Other Ambulatory Visit: Payer: Self-pay | Admitting: Internal Medicine

## 2023-12-29 ENCOUNTER — Other Ambulatory Visit: Payer: Self-pay

## 2023-12-29 MED ORDER — DAPAGLIFLOZIN PROPANEDIOL 5 MG PO TABS: 5.0000 mg | ORAL_TABLET | Freq: Every day | ORAL | 3 refills | Status: DC

## 2024-01-04 ENCOUNTER — Other Ambulatory Visit: Payer: Self-pay

## 2024-01-04 MED ORDER — DAPAGLIFLOZIN PROPANEDIOL 5 MG PO TABS: 5.0000 mg | ORAL_TABLET | Freq: Every day | ORAL | 3 refills | Status: DC

## 2024-01-10 ENCOUNTER — Ambulatory Visit (INDEPENDENT_AMBULATORY_CARE_PROVIDER_SITE_OTHER): Payer: Medicare Other | Admitting: Internal Medicine

## 2024-01-10 ENCOUNTER — Encounter: Payer: Self-pay | Admitting: Internal Medicine

## 2024-01-10 VITALS — BP 110/74 | Ht 72.0 in | Wt 199.0 lb

## 2024-01-10 DIAGNOSIS — Z7984 Long term (current) use of oral hypoglycemic drugs: Secondary | ICD-10-CM | POA: Diagnosis not present

## 2024-01-10 DIAGNOSIS — E1159 Type 2 diabetes mellitus with other circulatory complications: Secondary | ICD-10-CM | POA: Diagnosis not present

## 2024-01-10 DIAGNOSIS — N183 Chronic kidney disease, stage 3 unspecified: Secondary | ICD-10-CM | POA: Diagnosis not present

## 2024-01-10 DIAGNOSIS — E1122 Type 2 diabetes mellitus with diabetic chronic kidney disease: Secondary | ICD-10-CM | POA: Diagnosis not present

## 2024-01-10 LAB — POCT GLYCOSYLATED HEMOGLOBIN (HGB A1C): Hemoglobin A1C: 7.9 % — AB (ref 4.0–5.6)

## 2024-01-10 MED ORDER — GLIPIZIDE 10 MG PO TABS: 10.0000 mg | ORAL_TABLET | Freq: Two times a day (BID) | ORAL | 3 refills | Status: DC

## 2024-01-10 MED ORDER — DAPAGLIFLOZIN PROPANEDIOL 5 MG PO TABS: 5.0000 mg | ORAL_TABLET | Freq: Every day | ORAL | 3 refills | Status: DC

## 2024-01-10 MED ORDER — METFORMIN HCL ER 500 MG PO TB24: 500.0000 mg | ORAL_TABLET | Freq: Two times a day (BID) | ORAL | 3 refills | Status: DC

## 2024-01-10 NOTE — Progress Notes (Signed)
Name: Jack Wade  MRN/ DOB: [id number], 03-Nov-1941   Age/ Sex: 83 y.o., male    PCP: Bradd Canary, MD   Reason for Endocrinology Evaluation: Type 2 Diabetes Mellitus     Date of Initial Endocrinology Visit: 10/19/2021    PATIENT IDENTIFIER: Jack Wade is a 83 y.o. male with a past medical history of T2DM, Dyslipidemia and GERD . The patient presented for initial endocrinology clinic visit on 10/19/2021 for consultative assistance with his diabetes management.    HPI: Jack Wade was    Diagnosed with DM years ago  Prior Medications tried/Intolerance: as listed  Currently checking blood sugars occasionally  Hypoglycemia episodes : no               Hemoglobin A1c has ranged from 7.1% in 2019, peaking at 7.9% in 2022. Patient required assistance for hypoglycemia: no Patient has required hospitalization within the last 1 year from hyper or hypoglycemia: no  In terms of diet, the patient eats 3 meals, snacks in the evening . The pt drinks sugar sweetened beverages    SUBJECTIVE:   During the last visit (09/08/2023): A1c 7.5%     Today (01/10/24): Jack Wade  is here for a follow up on diabetes management . He  checks his blood sugars occasionally 1x daily. The patient has not had hypoglycemic episodes since the last clinic visit.    Follows with Cardiology for CAD He is on patient assistance program for Farxiga  Has chronic constipation but no  diarrhea  Denies  nausea or vomiting     HOME DIABETES REGIMEN: Glipizide 5 mg , 2 tablets before breakfast and 1 tablet before supper Metformin 500 mg 1 tabs BID  Farxiga 5 mg daily - through ot assistance     Statin: yes ACE-I/ARB: no    METER DOWNLOAD SUMMARY: 12/12/2023-01/10/2024 Fingerstick Blood Glucose Tests = 14 Average Number Tests/Day = 0 Overall Mean FS Glucose = 162 Standard Deviation = 24  BG Ranges: Low = 126 High = 206  BG Target % Results: % In target = 77 % Over  target = 29 % Under target = 0  Hypoglycemic Events/30 Days: BG < 50 = 0 Episodes of symptomatic severe hypoglycemia = 0     DIABETIC COMPLICATIONS: Microvascular complications:  CKD III, ? retinopathy ( Right eye - stable ) , neuropathy  Denies: neuropathy  Last eye exam: Completed 08/2022  Macrovascular complications:  CAD , PVD Denies: CVA   PAST HISTORY: Past Medical History:  Past Medical History:  Diagnosis Date   Aortic calcification (HCC) 02/09/2015   Appendicitis    Back pain 02/09/2015   CAD (coronary artery disease)    2 stents   CAD, multiple vessel 05/17/2016   CABG x6 vessel   Cataract    bilateral   Chicken pox    Diabetes mellitus type 2 in obese 04/11/2016   Diabetes type 2, controlled (HCC)    Dysphagia, unspecified(787.20) 03/20/2014   Hereditary and idiopathic peripheral neuropathy 02/09/2015   Hyperlipidemia    Hypertension    Hypothyroidism    Light headedness 07/04/2017   Measles    Medicare annual wellness visit, subsequent 06/14/2015   Melanoma (HCC)    Scalp. 2012   Mumps as a child   Thyroid disease    [place] Hyperthyroidism   Past Surgical History:  Past Surgical History:  Procedure Laterality Date   APPENDECTOMY     BACK SURGERY     x2 lumbar  BACK SURGERY     late 1990s had 2 surgeries, first surgery lifted a heavy engine ruptured disds, at L4 and L5, cleaned discs no hardware very helpful. 2 years later while recovering from angiogram with weights applied to left leg caused a recurrence and required surgery again in same area with disc repair   BIOPSY  12/13/2022   Procedure: BIOPSY;  Surgeon: Lynann Bologna, MD;  Location: [facility];  Service: Gastroenterology;;   CARDIAC CATHETERIZATION     CARDIAC CATHETERIZATION N/A 05/11/2016   Procedure: Left Heart Cath and Coronary Angiography;  Surgeon: Lyn Records, MD;  Location: [facility] INVASIVE CV LAB;  Service: Cardiovascular;  Laterality: N/A;   CARDIAC CATHETERIZATION N/A  05/11/2016   Procedure: Intravascular Pressure Wire/FFR Study;  Surgeon: Lyn Records, MD;  Location: [facility] INVASIVE CV LAB;  Service: Cardiovascular;  Laterality: N/A;   COLONOSCOPY WITH PROPOFOL N/A 12/13/2022   Procedure: COLONOSCOPY WITH PROPOFOL;  Surgeon: Lynann Bologna, MD;  Location: [facility];  Service: Gastroenterology;  Laterality: N/A;   CORONARY ANGIOPLASTY WITH STENT PLACEMENT     2 stents   CORONARY ARTERY BYPASS GRAFT N/A 05/17/2016   Procedure: CORONARY ARTERY BYPASS GRAFTING (CABG) x 6 using left mammory artery and right greater saphenous vein harvested endoscopically. [place] to LAD, SVG to Diagonal, SVG Sequential to OM2 and ramus intermediate, SVG Sequential to RCA and PDA;  Surgeon: Delight Ovens, MD;  Location: [facility];  Service: Open Heart Surgery;  Laterality: N/A;   ESOPHAGOGASTRODUODENOSCOPY (EGD) WITH PROPOFOL N/A 12/13/2022   Procedure: ESOPHAGOGASTRODUODENOSCOPY (EGD) WITH PROPOFOL;  Surgeon: Lynann Bologna, MD;  Location: [facility];  Service: Gastroenterology;  Laterality: N/A;   KNEE ARTHROSCOPY     right knee   LUMBAR LAMINECTOMY/DECOMPRESSION MICRODISCECTOMY Bilateral 04/10/2015   Procedure: LUMBAR LAMINECTOMY/DECOMPRESSION MICRODISCECTOMY 2 LEVELS;  Surgeon: Coletta Memos, MD;  Location: [facility];  Service: Neurosurgery;  Laterality: Bilateral;  Bilateral L45 L5S1 Laminectomy and Foraminotomy   MALONEY DILATION  12/13/2022   Procedure: MALONEY DILATION;  Surgeon: Lynann Bologna, MD;  Location: [facility];  Service: Gastroenterology;;   POLYPECTOMY  12/13/2022   Procedure: POLYPECTOMY;  Surgeon: Lynann Bologna, MD;  Location: [facility];  Service: Gastroenterology;;   ROTATOR CUFF REPAIR Right    SKIN CANCER EXCISION     melanoma on scalp   TEE WITHOUT CARDIOVERSION N/A 05/17/2016   Procedure: TRANSESOPHAGEAL ECHOCARDIOGRAM (TEE);  Surgeon: Delight Ovens, MD;  Location: [facility] OR;  Service: Open Heart Surgery;  Laterality: N/A;   TONSILLECTOMY AND  ADENOIDECTOMY     WISDOM TOOTH EXTRACTION      Social History:  reports that he has quit smoking. His smoking use included cigarettes. He started smoking about 40 years ago. He has a 80.1 pack-year smoking history. He has never used smokeless tobacco. He reports current alcohol use. He reports that he does not use drugs. Family History:  Family History  Problem Relation Age of Onset   Heart disease Father    Emphysema Father    Hypertension Father    Cancer Father        lung/ healthy   Heart disease Mother        Deceased   Cancer Mother        breast   Hypertension Mother    Cancer Maternal Grandmother        colon   Diabetes Brother        type 2   Heart disease Brother    Pulmonary embolism Son  vasculiti   Depression Paternal Grandmother    Depression Paternal Aunt      HOME MEDICATIONS: Allergies as of 01/10/2024   No Known Allergies      Medication List        Accurate as of January 10, 2024 10:02 AM. If you have any questions, ask your nurse or doctor.          aspirin 81 MG tablet Take 81 mg by mouth daily.   atorvastatin 80 MG tablet Commonly known as: LIPITOR TAKE 1 TABLET BY MOUTH EVERYDAY AT BEDTIME   dapagliflozin propanediol 5 MG Tabs tablet Commonly known as: Farxiga Take 1 tablet (5 mg total) by mouth daily before breakfast.   glipiZIDE 5 MG tablet Commonly known as: GLUCOTROL Take 2 tablets (10 mg total) by mouth daily before breakfast AND 1 tablet (5 mg total) daily before supper   levothyroxine 112 MCG tablet Commonly known as: SYNTHROID TAKE 1 TABLET BY MOUTH EVERY DAY   metFORMIN 500 MG 24 hr tablet Commonly known as: GLUCOPHAGE-XR TAKE 1 TABLET (500 MG TOTAL) BY MOUTH IN THE MORNING AND AT BEDTIME   OneTouch Delica Lancets 30G Misc 1 Device by Does not apply route daily in the afternoon.   OneTouch Verio Flex System w/Device Kit USE TO CHECK BLOOD SUGAR ONCE A DAY AND AS NEEDED. DX CODE: E11.8   OneTouch Verio test  strip Generic drug: glucose blood USE TO CHECK BLOOD SUGAR ONCE A DAY AND AS NEEDED. DX CODE: E11.8   pantoprazole 40 MG tablet Commonly known as: PROTONIX TAKE 1 TABLET (40 MG TOTAL) BY MOUTH DAILY TAKE 30 MINUTES BEFORE BREAKFAST         ALLERGIES: No Known Allergies      OBJECTIVE:   VITAL SIGNS: BP 110/74 (BP Location: Left Arm, Patient Position: Sitting, Cuff Size: Normal)   Ht 6' (1.829 m)   Wt 199 lb (90.3 kg)   BMI 26.99 kg/m    PHYSICAL EXAM:  General: Pt appears well and is in NAD  Lungs: Clear with good BS bilat with no rales, rhonchi, or wheezes  Heart: RRR   Extremities:  Lower extremities - No pretibial edema  Neuro: MS is good with appropriate affect, pt is alert and Ox3     DM Foot Exam 09/08/2023   The skin of the feet shows left great toe bandage but no other ulcerations The pedal pulses are undetectable  The sensation is mostly absent to a screening 5.07, 10 gram monofilament bilaterally   DATA REVIEWED:  Lab Results  Component Value Date   HGBA1C 7.5 (A) 09/08/2023   HGBA1C 8.1 (H) 07/18/2023   HGBA1C 7.7 (A) 03/07/2023    Latest Reference Range & Units 01/10/24 10:25  Microalb, Ur mg/dL <1.6  MICROALB/CREAT RATIO <30 mg/g creat NOTE  Creatinine, Urine 20 - 320 mg/dL 44     Latest Reference Range & Units 01/10/24 10:25  Sodium 135 - 146 mmol/L 139  Potassium 3.5 - 5.3 mmol/L 4.3  Chloride 98 - 110 mmol/L 104  CO2 20 - 32 mmol/L 26  Glucose 65 - 99 mg/dL 109 (H)  BUN 7 - 25 mg/dL 20  Creatinine 6.04 - 5.40 mg/dL 9.81 (H)  Calcium 8.6 - 10.3 mg/dL 9.6  BUN/Creatinine Ratio 6 - 22 (calc) 14       Latest Reference Range & Units 07/18/23 10:19  Sodium 135 - 145 mEq/L 137  Potassium 3.5 - 5.1 mEq/L 4.4  Chloride 96 - 112 mEq/L 104  CO2 19 - 32 mEq/L 26  Glucose 70 - 99 mg/dL 409 (H)  BUN 6 - 23 mg/dL 18  Creatinine 8.11 - 9.14 mg/dL 7.82  Calcium 8.4 - 95.6 mg/dL 9.3  Alkaline Phosphatase 39 - 117 U/L 109  Albumin 3.5 -  5.2 g/dL 4.1  AST 0 - 37 U/L 19  ALT 0 - 53 U/L 14  Total Protein 6.0 - 8.3 g/dL 6.9  Total Bilirubin 0.2 - 1.2 mg/dL 0.6  GFR >21.30 mL/min 45.85 (L)    Latest Reference Range & Units 07/18/23 10:19  Total CHOL/HDL Ratio  2  Cholesterol 0 - 200 mg/dL 92  HDL Cholesterol >86.57 mg/dL 84.69 (L)  LDL (calc) 0 - 99 mg/dL 25  NonHDL  62.95  Triglycerides 0.0 - 149.0 mg/dL 284.1  VLDL 0.0 - 32.4 mg/dL 40.1         ASSESSMENT / PLAN / RECOMMENDATIONS:   1) Type 2 Diabetes Mellitus, Sub- Optimally controlled, With CKD III, Neuropathic, retinopathic and macrovascular complications - Most recent A1c of 7.5 %. Goal A1c < 7.5%.    -A1c has trended up -Patient declines glycemic agents that would cause weight loss, hence he does not want to increase [place] -Limited glycemic agents due to CKD III -He has been approved through patient assistance program to receive Farxiga -Will increase glipizide as he has been out of postprandial hyperglycemia    MEDICATIONS: Continue Farxiga 5 mg 1 tablet daily Change glipizide 10 mg, 1 tablet before breakfast and 1 tablet before Supper Continue metformin 500 mg 1 tablet twice a day  EDUCATION / INSTRUCTIONS: BG monitoring instructions: Patient is instructed to check his blood sugars 1 times a day, fasting. Call [facility] if: BG persistently < 70  I reviewed the Rule of 15 for the treatment of hypoglycemia in detail with the patient. Literature supplied.   2) Diabetic complications:  Eye: Does  have known diabetic retinopathy.  Neuro/ Feet: Does  have known diabetic peripheral neuropathy. Renal: Patient does  have known baseline CKD. He is not on an ACEI/ARB at present.    3) CKD III:  -Patient not on ACEi, nor ARB due to borderline low BP and risk of hypotension -Will continue to monitor    Follow-up in 4 months    Signed electronically by: Lyndle Herrlich, MD  [facility] Endocrinology  [facility] [address] [place] [place], [place] [address] Phone: [phone] FAX: [phone]   CC: Bradd Canary, MD [address] [place] [address] Phone: [phone]  Fax: [phone]    Return to Endocrinology clinic as below: Future Appointments  Date Time Provider Department Center  01/18/2024  8:20 AM Bradd Canary, MD [id number]

## 2024-01-10 NOTE — Patient Instructions (Signed)
 Change  Glipizide  10 mg, 1 tablet before breakfast and 1 tablet before Supper  Continue Farxiga  5 mg, 1 tablet in the morning  Continue Metformin  500 mg ,1 tablet twice a day   HOW TO TREAT LOW BLOOD SUGARS (Blood sugar LESS THAN 70 MG/DL) Please follow the RULE OF 15 for the treatment of hypoglycemia treatment (when your (blood sugars are less than 70 mg/dL)   STEP 1: Take 15 grams of carbohydrates when your blood sugar is low, which includes:  3-4 GLUCOSE TABS  OR 3-4 OZ OF JUICE OR REGULAR SODA OR ONE TUBE OF GLUCOSE GEL    STEP 2: RECHECK blood sugar in 15 MINUTES STEP 3: If your blood sugar is still low at the 15 minute recheck --> then, go back to STEP 1 and treat AGAIN with another 15 grams of carbohydrates.

## 2024-01-11 LAB — BASIC METABOLIC PANEL
BUN/Creatinine Ratio: 14 (calc) (ref 6–22)
BUN: 20 mg/dL (ref 7–25)
CO2: 26 mmol/L (ref 20–32)
Calcium: 9.6 mg/dL (ref 8.6–10.3)
Chloride: 104 mmol/L (ref 98–110)
Creat: 1.4 mg/dL — ABNORMAL HIGH (ref 0.70–1.22)
Glucose, Bld: 197 mg/dL — ABNORMAL HIGH (ref 65–99)
Potassium: 4.3 mmol/L (ref 3.5–5.3)
Sodium: 139 mmol/L (ref 135–146)

## 2024-01-11 LAB — MICROALBUMIN / CREATININE URINE RATIO
Creatinine, Urine: 44 mg/dL (ref 20–320)
Microalb, Ur: <0.2 mg/dL

## 2024-01-14 NOTE — Assessment & Plan Note (Addendum)
Well controlled, no changes to meds. Encouraged heart healthy diet such as the DASH diet and exercise as tolerated.  Annual covid and flu boosters RSV and prevnar 20

## 2024-01-14 NOTE — Assessment & Plan Note (Signed)
Tolerating statin, encouraged heart healthy diet, avoid trans fats, minimize simple carbs and saturated fats. Increase exercise as tolerated 

## 2024-01-14 NOTE — Assessment & Plan Note (Signed)
On Levothyroxine, continue to monitor 

## 2024-01-14 NOTE — Assessment & Plan Note (Signed)
Asymptomatic, managing medically

## 2024-01-14 NOTE — Assessment & Plan Note (Signed)
hgba1c acceptable, minimize simple carbs. Increase exercise as tolerated. Continue current meds 

## 2024-01-14 NOTE — Assessment & Plan Note (Signed)
hgba1c mildly elevated continues to follow with endocrinology, minimize simple carbs. Increase exercise as tolerated. Continue current meds

## 2024-01-14 NOTE — Assessment & Plan Note (Signed)
Hydrate and monitor 

## 2024-01-18 ENCOUNTER — Ambulatory Visit (INDEPENDENT_AMBULATORY_CARE_PROVIDER_SITE_OTHER): Payer: Medicare Other | Admitting: Family Medicine

## 2024-01-18 VITALS — BP 132/66 | HR 80 | Temp 97.8°F | Resp 18 | Ht 72.0 in | Wt 199.6 lb

## 2024-01-18 DIAGNOSIS — E038 Other specified hypothyroidism: Secondary | ICD-10-CM | POA: Diagnosis not present

## 2024-01-18 DIAGNOSIS — N183 Chronic kidney disease, stage 3 unspecified: Secondary | ICD-10-CM | POA: Diagnosis not present

## 2024-01-18 DIAGNOSIS — E1122 Type 2 diabetes mellitus with diabetic chronic kidney disease: Secondary | ICD-10-CM

## 2024-01-18 DIAGNOSIS — I700 Atherosclerosis of aorta: Secondary | ICD-10-CM

## 2024-01-18 DIAGNOSIS — E7800 Pure hypercholesterolemia, unspecified: Secondary | ICD-10-CM | POA: Diagnosis not present

## 2024-01-18 DIAGNOSIS — E1142 Type 2 diabetes mellitus with diabetic polyneuropathy: Secondary | ICD-10-CM

## 2024-01-18 DIAGNOSIS — I10 Essential (primary) hypertension: Secondary | ICD-10-CM | POA: Diagnosis not present

## 2024-01-18 LAB — LIPID PANEL
Cholesterol: 98 mg/dL (ref 0–200)
HDL: 44.5 mg/dL (ref >39.00)
LDL Cholesterol: 28 mg/dL (ref 0–99)
NonHDL: 53.07
Total CHOL/HDL Ratio: 2
Triglycerides: 125 mg/dL (ref 0.0–149.0)
VLDL: 25 mg/dL (ref 0.0–40.0)

## 2024-01-18 LAB — CBC WITH DIFFERENTIAL/PLATELET
Basophils Absolute: 0.1 10*3/uL (ref 0.0–0.1)
Basophils Relative: 1 % (ref 0.0–3.0)
Eosinophils Absolute: 0.3 10*3/uL (ref 0.0–0.7)
Eosinophils Relative: 3.9 % (ref 0.0–5.0)
HCT: 44.9 % (ref 39.0–52.0)
Hemoglobin: 14.3 g/dL (ref 13.0–17.0)
Lymphocytes Relative: 15.7 % (ref 12.0–46.0)
Lymphs Abs: 1.3 10*3/uL (ref 0.7–4.0)
MCHC: 31.9 g/dL (ref 30.0–36.0)
MCV: 83.6 fL (ref 78.0–100.0)
Monocytes Absolute: 0.8 10*3/uL (ref 0.1–1.0)
Monocytes Relative: 10.4 % (ref 3.0–12.0)
Neutro Abs: 5.6 10*3/uL (ref 1.4–7.7)
Neutrophils Relative %: 69 % (ref 43.0–77.0)
Platelets: 243 10*3/uL (ref 150.0–400.0)
RBC: 5.37 Mil/uL (ref 4.22–5.81)
RDW: 14.8 % (ref 11.5–15.5)
WBC: 8.1 10*3/uL (ref 4.0–10.5)

## 2024-01-18 LAB — COMPREHENSIVE METABOLIC PANEL
ALT: 16 U/L (ref 0–53)
AST: 20 U/L (ref 0–37)
Albumin: 4.4 g/dL (ref 3.5–5.2)
Alkaline Phosphatase: 90 U/L (ref 39–117)
BUN: 22 mg/dL (ref 6–23)
CO2: 26 meq/L (ref 19–32)
Calcium: 9.7 mg/dL (ref 8.4–10.5)
Chloride: 105 meq/L (ref 96–112)
Creatinine, Ser: 1.52 mg/dL — ABNORMAL HIGH (ref 0.40–1.50)
GFR: 42.82 mL/min — ABNORMAL LOW (ref >60.00)
Glucose, Bld: 218 mg/dL — ABNORMAL HIGH (ref 70–99)
Potassium: 4.1 meq/L (ref 3.5–5.1)
Sodium: 139 meq/L (ref 135–145)
Total Bilirubin: 0.6 mg/dL (ref 0.2–1.2)
Total Protein: 7 g/dL (ref 6.0–8.3)

## 2024-01-18 LAB — TSH: TSH: 0.47 u[IU]/mL (ref 0.35–5.50)

## 2024-01-18 NOTE — Patient Instructions (Addendum)
RSV, Respiratory Syncitial Virus Vaccine, Arexvy vaccine at pharmacy   Prevnar 20 once at pharmacy

## 2024-01-18 NOTE — Progress Notes (Signed)
Subjective:    Patient ID: Jack Wade, male    DOB: 11-Sep-1941, 83 y.o.   MRN: [id number]  Chief Complaint  Patient presents with   Follow-up    6 month    HPI Patient is in today for follow up on chronic medical concerns. No recent febrile illness or hospitalizations. Denies CP/palp/SOB/HA/congestion/fevers/GI or GU c/o. Taking meds as prescribed. He continues to follow with endocrinology. He is trying to stay active and eat right. Denies CP/palp/SOB/HA/congestion/fevers/GI or GU c/o. Taking meds as prescribed. Does note he has to take a Docusate cap at night to manage his constipation and that is mostly helpful.   Past Medical History:  Diagnosis Date   Aortic calcification (HCC) 02/09/2015   Appendicitis    Back pain 02/09/2015   CAD (coronary artery disease)    2 stents   CAD, multiple vessel 05/17/2016   CABG x6 vessel   Cataract    bilateral   Chicken pox    Diabetes mellitus type 2 in obese 04/11/2016   Diabetes type 2, controlled (HCC)    Dysphagia, unspecified(787.20) 03/20/2014   Hereditary and idiopathic peripheral neuropathy 02/09/2015   Hyperlipidemia    Hypertension    Hypothyroidism    Light headedness 07/04/2017   Measles    Medicare annual wellness visit, subsequent 06/14/2015   Melanoma (HCC)    Scalp. 2012   Mumps as a child   Thyroid disease    Beoming Hyperthyroidism    Past Surgical History:  Procedure Laterality Date   APPENDECTOMY     BACK SURGERY     x2 lumbar   BACK SURGERY     late 1990s had 2 surgeries, first surgery lifted a heavy engine ruptured disds, at L4 and L5, cleaned discs no hardware very helpful. 2 years later while recovering from angiogram with weights applied to left leg caused a recurrence and required surgery again in same area with disc repair   BIOPSY  12/13/2022   Procedure: BIOPSY;  Surgeon: Lynann Bologna, MD;  Location: [facility];  Service: Gastroenterology;;   CARDIAC CATHETERIZATION     CARDIAC  CATHETERIZATION N/A 05/11/2016   Procedure: Left Heart Cath and Coronary Angiography;  Surgeon: Lyn Records, MD;  Location: [facility] INVASIVE CV LAB;  Service: Cardiovascular;  Laterality: N/A;   CARDIAC CATHETERIZATION N/A 05/11/2016   Procedure: Intravascular Pressure Wire/FFR Study;  Surgeon: Lyn Records, MD;  Location: [facility] INVASIVE CV LAB;  Service: Cardiovascular;  Laterality: N/A;   COLONOSCOPY WITH PROPOFOL N/A 12/13/2022   Procedure: COLONOSCOPY WITH PROPOFOL;  Surgeon: Lynann Bologna, MD;  [facility];  Service: Gastroenterology;  Laterality: N/A;   CORONARY ANGIOPLASTY WITH STENT PLACEMENT     2 stents   CORONARY ARTERY BYPASS GRAFT N/A 05/17/2016   Procedure: CORONARY ARTERY BYPASS GRAFTING (CABG) x 6 using left mammory artery and right greater saphenous vein harvested endoscopically. Lima to LAD, SVG to Diagonal, SVG Sequential to OM2 and ramus intermediate, SVG Sequential to RCA and PDA;  Surgeon: Delight Ovens, MD;  Location: [facility] OR;  Service: Open Heart Surgery;  Laterality: N/A;   ESOPHAGOGASTRODUODENOSCOPY (EGD) WITH PROPOFOL N/A 12/13/2022   Procedure: ESOPHAGOGASTRODUODENOSCOPY (EGD) WITH PROPOFOL;  Surgeon: Lynann Bologna, MD;  [facility];  Service: Gastroenterology;  Laterality: N/A;   KNEE ARTHROSCOPY     right knee   LUMBAR LAMINECTOMY/DECOMPRESSION MICRODISCECTOMY Bilateral 04/10/2015   Procedure: LUMBAR LAMINECTOMY/DECOMPRESSION MICRODISCECTOMY 2 LEVELS;  Surgeon: Coletta Memos, MD;  [facility];  Service: Neurosurgery;  Laterality: Bilateral;  Bilateral L45 L5S1 Laminectomy and Foraminotomy   MALONEY DILATION  12/13/2022   Procedure: MALONEY DILATION;  Surgeon: Lynann Bologna, MD;  [facility];  Service: Gastroenterology;;   POLYPECTOMY  12/13/2022   Procedure: POLYPECTOMY;  Surgeon: Lynann Bologna, MD;  [facility];  Service: Gastroenterology;;   ROTATOR CUFF REPAIR Right    SKIN CANCER EXCISION     melanoma on scalp    TEE WITHOUT CARDIOVERSION N/A 05/17/2016   Procedure: TRANSESOPHAGEAL ECHOCARDIOGRAM (TEE);  Surgeon: Delight Ovens, MD;  Location: [facility] OR;  Service: Open Heart Surgery;  Laterality: N/A;   TONSILLECTOMY AND ADENOIDECTOMY     WISDOM TOOTH EXTRACTION      Family History  Problem Relation Age of Onset   Heart disease Father    Emphysema Father    Hypertension Father    Cancer Father        lung/ healthy   Heart disease Mother        Deceased   Cancer Mother        breast   Hypertension Mother    Cancer Maternal Grandmother        colon   Diabetes Brother        type 2   Heart disease Brother    Pulmonary embolism Son        vasculiti   Depression Paternal Grandmother    Depression Paternal Aunt     Social History   Socioeconomic History   Marital status: Married    Spouse name: Not on file   Number of children: 2   Years of education: Not on file   Highest education level: Not on file  Occupational History   Occupation: retired  Tobacco Use   Smoking status: Former    Current packs/day: 2.00    Average packs/day: 2.0 packs/day for 40.1 years (80.1 ttl pk-yrs)    Types: Cigarettes    Start date: 12/27/1983   Smokeless tobacco: Never  Vaping Use   Vaping status: Never Used  Substance and Sexual Activity   Alcohol use: Yes    Alcohol/week: 0.0 standard drinks of alcohol    Comment: occasionally   Drug use: No   Sexual activity: Never    Comment: lives with wife. retired, no dietary restrictions, Public relations account executive.   Other Topics Concern   Not on file  Social History Narrative   Not on file   Social Drivers of Health   Financial Resource Strain: Not on file  Food Insecurity: Not on file  Transportation Needs: Not on file  Physical Activity: Not on file  Stress: Not on file  Social Connections: Not on file  Intimate Partner Violence: Not on file    Outpatient Medications Prior to Visit  Medication Sig Dispense Refill   aspirin 81 MG tablet Take 81 mg by  mouth daily.     atorvastatin (LIPITOR) 80 MG tablet TAKE 1 TABLET BY MOUTH EVERYDAY AT BEDTIME 90 tablet 3   Blood Glucose Monitoring Suppl (ONETOUCH VERIO FLEX SYSTEM) w/Device KIT USE TO CHECK BLOOD SUGAR ONCE A DAY AND AS NEEDED. DX CODE: E11.8 1 kit 0   dapagliflozin propanediol (FARXIGA) 5 MG TABS tablet Take 1 tablet (5 mg total) by mouth daily before breakfast. 90 tablet 3   glipiZIDE (GLUCOTROL) 10 MG tablet Take 1 tablet (10 mg total) by mouth 2 (two) times daily before a meal. 180 tablet 3   levothyroxine (SYNTHROID) 112 MCG tablet TAKE 1 TABLET BY MOUTH EVERY DAY  90 tablet 1   metFORMIN (GLUCOPHAGE-XR) 500 MG 24 hr tablet Take 1 tablet (500 mg total) by mouth in the morning and at bedtime. 180 tablet 3   OneTouch Delica Lancets 30G MISC 1 Device by Does not apply route daily in the afternoon. 100 each 3   ONETOUCH VERIO test strip USE TO CHECK BLOOD SUGAR ONCE A DAY AND AS NEEDED. DX CODE: E11.8 100 strip 1   pantoprazole (PROTONIX) 40 MG tablet TAKE 1 TABLET (40 MG TOTAL) BY MOUTH DAILY TAKE 30 MINUTES BEFORE BREAKFAST 90 tablet 1   Facility-Administered Medications Prior to Visit  Medication Dose Route Frequency Provider Last Rate Last Admin   0.9 %  sodium chloride infusion  500 mL Intravenous Once Lynann Bologna, MD        No Known Allergies  Review of Systems  Constitutional:  Negative for fever and malaise/fatigue.  HENT:  Negative for congestion.   Eyes:  Negative for blurred vision.  Respiratory:  Negative for shortness of breath.   Cardiovascular:  Negative for chest pain, palpitations and leg swelling.  Gastrointestinal:  Positive for constipation. Negative for abdominal pain, blood in stool and nausea.  Genitourinary:  Negative for dysuria and frequency.  Musculoskeletal:  Negative for falls.  Skin:  Negative for rash.  Neurological:  Negative for dizziness, loss of consciousness and headaches.  Endo/Heme/Allergies:  Negative for environmental allergies.   Psychiatric/Behavioral:  Negative for depression. The patient is nervous/anxious.        Objective:    Physical Exam Vitals reviewed.  Constitutional:      Appearance: Normal appearance. He is not ill-appearing.  HENT:     Head: Normocephalic and atraumatic.     Nose: Nose normal.  Eyes:     Conjunctiva/sclera: Conjunctivae normal.  Cardiovascular:     Rate and Rhythm: Normal rate.     Pulses: Normal pulses.     Heart sounds: Normal heart sounds. No murmur heard. Pulmonary:     Effort: Pulmonary effort is normal.     Breath sounds: Normal breath sounds. No wheezing.  Abdominal:     Palpations: Abdomen is soft. There is no mass.     Tenderness: There is no abdominal tenderness.  Musculoskeletal:     Cervical back: Normal range of motion.     Right lower leg: No edema.     Left lower leg: No edema.  Skin:    General: Skin is warm and dry.  Neurological:     General: No focal deficit present.     Mental Status: He is alert and oriented to person, place, and time.  Psychiatric:        Mood and Affect: Mood normal.     BP 132/66 (BP Location: Right Arm, Patient Position: Sitting, Cuff Size: Large)   Pulse 80   Temp 97.8 F (36.6 C) (Oral)   Resp 18   Ht 6' (1.829 m)   Wt 199 lb 9.6 oz (90.5 kg)   SpO2 95%   BMI 27.07 kg/m  Wt Readings from Last 3 Encounters:  01/18/24 199 lb 9.6 oz (90.5 kg)  01/10/24 199 lb (90.3 kg)  09/08/23 185 lb 6.4 oz (84.1 kg)    Diabetic Foot Exam - Simple   No data filed    Lab Results  Component Value Date   WBC 6.6 07/18/2023   HGB 14.6 07/18/2023   HCT 45.3 07/18/2023   PLT 218.0 07/18/2023   GLUCOSE 197 (H) 01/10/2024   CHOL 92 07/18/2023  TRIG 144.0 07/18/2023   HDL 37.80 (L) 07/18/2023   LDLDIRECT 43.0 04/11/2016   LDLCALC 25 07/18/2023   ALT 14 07/18/2023   AST 19 07/18/2023   NA 139 01/10/2024   K 4.3 01/10/2024   CL 104 01/10/2024   CREATININE 1.40 (H) 01/10/2024   BUN 20 01/10/2024   CO2 26 01/10/2024    TSH 0.51 07/18/2023   PSA 0.91 09/24/2020   INR 1.61 (H) 05/17/2016   HGBA1C 7.9 (A) 01/10/2024   MICROALBUR <0.2 01/10/2024    Lab Results  Component Value Date   TSH 0.51 07/18/2023   Lab Results  Component Value Date   WBC 6.6 07/18/2023   HGB 14.6 07/18/2023   HCT 45.3 07/18/2023   MCV 82.6 07/18/2023   PLT 218.0 07/18/2023   Lab Results  Component Value Date   NA 139 01/10/2024   K 4.3 01/10/2024   CO2 26 01/10/2024   GLUCOSE 197 (H) 01/10/2024   BUN 20 01/10/2024   CREATININE 1.40 (H) 01/10/2024   BILITOT 0.6 07/18/2023   ALKPHOS 109 07/18/2023   AST 19 07/18/2023   ALT 14 07/18/2023   PROT 6.9 07/18/2023   ALBUMIN 4.1 07/18/2023   CALCIUM 9.6 01/10/2024   ANIONGAP 10 03/15/2022   GFR 45.85 (L) 07/18/2023   Lab Results  Component Value Date   CHOL 92 07/18/2023   Lab Results  Component Value Date   HDL 37.80 (L) 07/18/2023   Lab Results  Component Value Date   LDLCALC 25 07/18/2023   Lab Results  Component Value Date   TRIG 144.0 07/18/2023   Lab Results  Component Value Date   CHOLHDL 2 07/18/2023   Lab Results  Component Value Date   HGBA1C 7.9 (A) 01/10/2024       Assessment & Plan:  Stage 3 chronic kidney disease, unspecified whether stage 3a or 3b CKD (HCC) Assessment & Plan: Hydrate and monitor   Aortic calcification (HCC) Assessment & Plan: Asymptomatic, managing medically   Type 2 diabetes mellitus with diabetic chronic kidney disease, unspecified CKD stage, unspecified whether long term insulin use (HCC) Assessment & Plan: hgba1c acceptable, minimize simple carbs. Increase exercise as tolerated. Continue current meds    Primary hypertension Assessment & Plan: Well controlled, no changes to meds. Encouraged heart healthy diet such as the DASH diet and exercise as tolerated.  Annual covid and flu boosters RSV and prevnar 20   Orders: -     CBC with Differential/Platelet -     Comprehensive metabolic panel -      TSH  Pure hypercholesterolemia Assessment & Plan: Tolerating statin, encouraged heart healthy diet, avoid trans fats, minimize simple carbs and saturated fats. Increase exercise as tolerated  Orders: -     Lipid panel  Other specified hypothyroidism Assessment & Plan: On Levothyroxine, continue to monitor   Type 2 diabetes mellitus with diabetic polyneuropathy, without long-term current use of insulin (HCC) Assessment & Plan: hgba1c mildly elevated continues to follow with endocrinology, minimize simple carbs. Increase exercise as tolerated. Continue current meds      Danise Edge, MD

## 2024-01-20 ENCOUNTER — Encounter: Payer: Self-pay | Admitting: Family Medicine

## 2024-02-12 ENCOUNTER — Other Ambulatory Visit: Payer: Self-pay | Admitting: Nurse Practitioner

## 2024-05-08 ENCOUNTER — Other Ambulatory Visit: Payer: Self-pay | Admitting: Internal Medicine

## 2024-05-08 DIAGNOSIS — E785 Hyperlipidemia, unspecified: Secondary | ICD-10-CM

## 2024-05-09 ENCOUNTER — Ambulatory Visit: Payer: Medicare Other | Admitting: Internal Medicine

## 2024-05-17 ENCOUNTER — Ambulatory Visit (INDEPENDENT_AMBULATORY_CARE_PROVIDER_SITE_OTHER): Payer: Medicare Other | Admitting: Internal Medicine

## 2024-05-17 ENCOUNTER — Encounter: Payer: Self-pay | Admitting: Internal Medicine

## 2024-05-17 VITALS — BP 118/70 | HR 67 | Ht 72.0 in | Wt 193.0 lb

## 2024-05-17 DIAGNOSIS — E1159 Type 2 diabetes mellitus with other circulatory complications: Secondary | ICD-10-CM | POA: Diagnosis not present

## 2024-05-17 DIAGNOSIS — Z7984 Long term (current) use of oral hypoglycemic drugs: Secondary | ICD-10-CM | POA: Diagnosis not present

## 2024-05-17 LAB — POCT GLUCOSE (DEVICE FOR HOME USE): POC Glucose: 216 mg/dL — AB (ref 70–99)

## 2024-05-17 LAB — POCT GLYCOSYLATED HEMOGLOBIN (HGB A1C): Hemoglobin A1C: 8.3 % — AB (ref 4.0–5.6)

## 2024-05-17 MED ORDER — SITAGLIPTIN PHOSPHATE 50 MG PO TABS: 50.0000 mg | ORAL_TABLET | Freq: Every day | ORAL | 3 refills | Status: DC

## 2024-05-17 MED ORDER — GLIPIZIDE 10 MG PO TABS: 20.0000 mg | ORAL_TABLET | Freq: Two times a day (BID) | ORAL | 3 refills | Status: DC

## 2024-05-17 NOTE — Progress Notes (Signed)
 Name: Jack Wade  MRN/ DOB: [id number], 04-29-1941   Age/ Sex: 83 y.o., male    PCP: Neda Balk, MD   Reason for Endocrinology Evaluation: Type 2 Diabetes Mellitus     Date of Initial Endocrinology Visit: 10/19/2021    PATIENT IDENTIFIER: Jack Wade is a 83 y.o. male with a past medical history of T2DM, Dyslipidemia and GERD . The patient presented for initial endocrinology clinic visit on 10/19/2021 for consultative assistance with his diabetes management.    HPI: Mr. Vermeer was    Diagnosed with DM years ago             Hemoglobin A1c has ranged from 7.1% in 2019, peaking at 7.9% in 2022.   In terms of diet, the patient eats 3 meals, snacks in the evening . The pt drinks sugar sweetened beverages    SUBJECTIVE:   During the last visit (01/10/2024): A1c 7.5%     Today (05/17/24): Linde Reveal  is here for a follow up on diabetes management . He  checks his blood sugars occasionally 0x daily. The patient has not had hypoglycemic episodes since the last clinic visit.    Follows with Cardiology for CAD He is on patient assistance program for Farxiga   Has chronic constipation but no diarrhea  Denies  nausea or vomiting     HOME DIABETES REGIMEN: Glipizide  10 mg , 2 tablets before breakfast and 2 tablet before supper Metformin  500 mg 1 tabs BID  Farxiga  5 mg daily - through ot assistance     Statin: yes ACE-I/ARB: no    METER DOWNLOAD SUMMARY: n/a      DIABETIC COMPLICATIONS: Microvascular complications:  CKD III, ? retinopathy ( Right eye - stable ) , neuropathy  Denies: neuropathy  Last eye exam: Completed 10/25/2023  Macrovascular complications:  CAD , PVD Denies: CVA   PAST HISTORY: Past Medical History:  Past Medical History:  Diagnosis Date   Aortic calcification (HCC) 02/09/2015   Appendicitis    Back pain 02/09/2015   CAD (coronary artery disease)    2 stents   CAD, multiple vessel 05/17/2016   CABG  x6 vessel   Cataract    bilateral   Chicken pox    Diabetes mellitus type 2 in obese 04/11/2016   Diabetes type 2, controlled (HCC)    Dysphagia, unspecified(787.20) 03/20/2014   Hereditary and idiopathic peripheral neuropathy 02/09/2015   Hyperlipidemia    Hypertension    Hypothyroidism    Light headedness 07/04/2017   Measles    Medicare annual wellness visit, subsequent 06/14/2015   Melanoma (HCC)    Scalp. 2012   Mumps as a child   Thyroid  disease    Beoming Hyperthyroidism   Past Surgical History:  Past Surgical History:  Procedure Laterality Date   APPENDECTOMY     BACK SURGERY     x2 lumbar   BACK SURGERY     late 1990s had 2 surgeries, first surgery lifted a heavy engine ruptured disds, at L4 and L5, cleaned discs no hardware very helpful. 2 years later while recovering from angiogram with weights applied to left leg caused a recurrence and required surgery again in same area with disc repair   BIOPSY  12/13/2022   Procedure: BIOPSY;  Surgeon: Lajuan Pila, MD;  Location: [facility];  Service: Gastroenterology;;   CARDIAC CATHETERIZATION     CARDIAC CATHETERIZATION N/A 05/11/2016   Procedure: Left Heart Cath and Coronary Angiography;  Surgeon: Arty Binning,  MD;  Location: [facility]VAS[facility] LAB;  Service: Cardiovascular;  Laterality: N/A;   CARDIAC CATHETERIZATION N/A 05/11/2016   Procedure: Intravascular Pressure Wire/FFR Study;  Surgeon: Arty Binning, MD;  Location: [facility] INVASIVE CV LAB;  Service: Cardiovascular;  Laterality: N/A;   COLONOSCOPY WITH PROPOFOL  N/A 12/13/2022   Procedure: COLONOSCOPY WITH PROPOFOL ;  Surgeon: Lajuan Pila, MD;  Location: [facility];  Service: Gastroenterology;  Laterality: N/A;   CORONARY ANGIOPLASTY WITH STENT PLACEMENT     2 stents   CORONARY ARTERY BYPASS GRAFT N/A 05/17/2016   Procedure: CORONARY ARTERY BYPASS GRAFTING (CABG) x 6 using left mammory artery and right greater saphenous vein harvested endoscopically. Lima to LAD, SVG to  Diagonal, SVG Sequential to OM2 and ramus intermediate, SVG Sequential to RCA and PDA;  Surgeon: Norita Beauvais, MD;  Location: [facility] OR;  Service: Open Heart Surgery;  Laterality: N/A;   ESOPHAGOGASTRODUODENOSCOPY (EGD) WITH PROPOFOL  N/A 12/13/2022   Procedure: ESOPHAGOGASTRODUODENOSCOPY (EGD) WITH PROPOFOL ;  Surgeon: Lajuan Pila, MD;  Location: [facility];  Service: Gastroenterology;  Laterality: N/A;   KNEE ARTHROSCOPY     right knee   LUMBAR LAMINECTOMY/DECOMPRESSION MICRODISCECTOMY Bilateral 04/10/2015   Procedure: LUMBAR LAMINECTOMY/DECOMPRESSION MICRODISCECTOMY 2 LEVELS;  Surgeon: Audie Bleacher, MD;  Location: [facility];  Service: Neurosurgery;  Laterality: Bilateral;  Bilateral L45 L5S1 Laminectomy and Foraminotomy   MALONEY DILATION  12/13/2022   Procedure: MALONEY DILATION;  Surgeon: Lajuan Pila, MD;  [facility];  Service: Gastroenterology;;   POLYPECTOMY  12/13/2022   Procedure: POLYPECTOMY;  Surgeon: Lajuan Pila, MD;  [facility];  Service: Gastroenterology;;   ROTATOR CUFF REPAIR Right    SKIN CANCER EXCISION     melanoma on scalp   TEE WITHOUT CARDIOVERSION N/A 05/17/2016   Procedure: TRANSESOPHAGEAL ECHOCARDIOGRAM (TEE);  Surgeon: Norita Beauvais, MD;  Location: [facility] OR;  Service: Open Heart Surgery;  Laterality: N/A;   TONSILLECTOMY AND ADENOIDECTOMY     WISDOM TOOTH EXTRACTION      Social History:  reports that he has quit smoking. His smoking use included cigarettes. He started smoking about 40 years ago. He has a 80.8 pack-year smoking history. He has never used smokeless tobacco. He reports current alcohol use. He reports that he does not use drugs. Family History:  Family History  Problem Relation Age of Onset   Heart disease Father    Emphysema Father    Hypertension Father    Cancer Father        lung/ healthy   Heart disease Mother        Deceased   Cancer Mother        breast   Hypertension Mother    Cancer Maternal Grandmother         colon   Diabetes Brother        type 2   Heart disease Brother    Pulmonary embolism Son        vasculiti   Depression Paternal Grandmother    Depression Paternal Aunt      HOME MEDICATIONS: Allergies as of 05/17/2024   No Known Allergies      Medication List        Accurate as of May 17, 2024 11:57 AM. If you have any questions, ask your nurse or doctor.          aspirin  81 MG tablet Take 81 mg by mouth daily.   atorvastatin  80 MG tablet Commonly known as: LIPITOR TAKE 1 TABLET BY MOUTH EVERYDAY AT BEDTIME  dapagliflozin  propanediol 5 MG Tabs tablet Commonly known as: Farxiga  Take 1 tablet (5 mg total) by mouth daily before breakfast.   glipiZIDE  10 MG tablet Commonly known as: GLUCOTROL  Take 1 tablet (10 mg total) by mouth 2 (two) times daily before a meal.   levothyroxine  112 MCG tablet Commonly known as: SYNTHROID  TAKE 1 TABLET BY MOUTH EVERY DAY   metFORMIN  500 MG 24 hr tablet Commonly known as: GLUCOPHAGE -XR Take 1 tablet (500 mg total) by mouth in the morning and at bedtime.   OneTouch Delica Lancets 30G Misc 1 Device by Does not apply route daily in the afternoon.   OneTouch Verio Flex System w/Device Kit USE TO CHECK BLOOD SUGAR ONCE A DAY AND AS NEEDED. DX CODE: E11.8   OneTouch Verio test strip Generic drug: glucose blood USE TO CHECK BLOOD SUGAR ONCE A DAY AND AS NEEDED. DX CODE: E11.8   pantoprazole  40 MG tablet Commonly known as: PROTONIX  TAKE 1 TABLET (40 MG TOTAL) BY MOUTH DAILY TAKE 30 MINUTES BEFORE BREAKFAST         ALLERGIES: No Known Allergies      OBJECTIVE:   VITAL SIGNS: BP 118/70 (BP Location: Left Arm, Patient Position: Sitting, Cuff Size: Normal)   Pulse 67   Ht 6' (1.829 m)   Wt 193 lb (87.5 kg)   SpO2 97%   BMI 26.18 kg/m    PHYSICAL EXAM:  General: Pt appears well and is in NAD  Lungs: Clear with good BS bilat   Heart: RRR   Extremities:  Lower extremities - No pretibial edema  Neuro: MS is  good with appropriate affect, pt is alert and Ox3     DM Foot Exam 09/08/2023   The skin of the feet shows left great toe bandage but no other ulcerations The pedal pulses are undetectable  The sensation is mostly absent to a screening 5.07, 10 gram monofilament bilaterally   DATA REVIEWED:  Lab Results  Component Value Date   HGBA1C 7.9 (A) 01/10/2024   HGBA1C 7.5 (A) 09/08/2023   HGBA1C 8.1 (H) 07/18/2023     Latest Reference Range & Units 01/18/24 09:08  Sodium 135 - 145 mEq/L 139  Potassium 3.5 - 5.1 mEq/L 4.1  Chloride 96 - 112 mEq/L 105  CO2 19 - 32 mEq/L 26  Glucose 70 - 99 mg/dL 841 (H)  BUN 6 - 23 mg/dL 22  Creatinine 3.24 - 4.01 mg/dL 0.27 (H)  Calcium  8.4 - 10.5 mg/dL 9.7  Alkaline Phosphatase 39 - 117 U/L 90  Albumin  3.5 - 5.2 g/dL 4.4  AST 0 - 37 U/L 20  ALT 0 - 53 U/L 16  Total Protein 6.0 - 8.3 g/dL 7.0  Total Bilirubin 0.2 - 1.2 mg/dL 0.6  GFR >25.36 mL/min 42.82 (L)      Latest Reference Range & Units 01/10/24 10:25  Microalb, Ur mg/dL <6.4  MICROALB/CREAT RATIO <30 mg/g creat NOTE  Creatinine, Urine 20 - 320 mg/dL 44      Latest Reference Range & Units 01/18/24 09:08  Total CHOL/HDL Ratio  2  Cholesterol 0 - 200 mg/dL 98  HDL Cholesterol >40.34 mg/dL 74.25  LDL (calc) 0 - 99 mg/dL 28  NonHDL  95.63  Triglycerides 0.0 - 149.0 mg/dL 875.6  VLDL 0.0 - 43.3 mg/dL 29.5    Latest Reference Range & Units 01/18/24 09:08  TSH 0.35 - 5.50 uIU/mL 0.47   In office BG 216 mg/dL       ASSESSMENT / PLAN /  RECOMMENDATIONS:   1) Type 2 Diabetes Mellitus, Sub- Optimally controlled, With CKD III, Neuropathic, retinopathic and macrovascular complications - Most recent A1c of 8.3 %. Goal A1c < 7.5%.    -A1c continues to increase -He had stopped checking glucose at home, he does not see the value of this -Limited glycemic agents due to CKD and he declines any medications that would cause weight loss such as GLP-1 agonist, hence we have not been able to  increase the Farxiga  which she obtains through patient assistance program -Patient declines glycemic agents that would cause weight loss, hence he does not want to increase Farxiga  - I have recommended Januvia , but after his visit a portal message was sent that the cost was high and he was more interested in seeing a CDE, a referral to our CDE has been placed   MEDICATIONS: Continue Farxiga  5 mg 1 tablet daily Change glipizide  10 mg, 2 tablet before breakfast and 2 tablet before Supper Continue metformin  500 mg 1 tablet twice a day  EDUCATION / INSTRUCTIONS: BG monitoring instructions: Patient is instructed to check his blood sugars 1 times a day, fasting. Call [facility] if: BG persistently < 70  I reviewed the Rule of 15 for the treatment of hypoglycemia in detail with the patient. Literature supplied.   2) Diabetic complications:  Eye: Does  have known diabetic retinopathy.  Neuro/ Feet: Does  have known diabetic peripheral neuropathy. Renal: Patient does  have known baseline CKD. He is not on an ACEI/ARB at present.    3) CKD III:  -Patient not on ACEi, nor ARB due to borderline low BP and risk of hypotension -Will continue to monitor    Follow-up in 4 months    Signed electronically by: Natale Bail, MD  [facility]  [facility] [address] [place] [place], [place] [address] Phone: [phone] FAX: [phone]   CC: Neda Balk, MD [address] [place] [address] Phone: [phone]  Fax: [phone]    Return to Endocrinology clinic as below: Future Appointments  Date Time Provider Department Center  09/04/2024 10:20 AM Lenise Quince, MD [facility] None  09/23/2024  1:20 PM Neda Balk, MD [id number]

## 2024-05-17 NOTE — Patient Instructions (Signed)
 Continue Glipizide  10 mg, 2 tablet before breakfast and 2 tablet before Supper  Continue Farxiga  5 mg, 1 tablet in the morning  Continue Metformin  500 mg ,1 tablet twice a day Start Januvia  50 mg daily    HOW TO TREAT LOW BLOOD SUGARS (Blood sugar LESS THAN 70 MG/DL) Please follow the RULE OF 15 for the treatment of hypoglycemia treatment (when your (blood sugars are less than 70 mg/dL)   STEP 1: Take 15 grams of carbohydrates when your blood sugar is low, which includes:  3-4 GLUCOSE TABS  OR 3-4 OZ OF JUICE OR REGULAR SODA OR ONE TUBE OF GLUCOSE GEL    STEP 2: RECHECK blood sugar in 15 MINUTES STEP 3: If your blood sugar is still low at the 15 minute recheck --> then, go back to STEP 1 and treat AGAIN with another 15 grams of carbohydrates.

## 2024-06-15 ENCOUNTER — Other Ambulatory Visit: Payer: Self-pay | Admitting: Internal Medicine

## 2024-06-24 ENCOUNTER — Ambulatory Visit: Admitting: Dietician

## 2024-07-15 ENCOUNTER — Other Ambulatory Visit: Payer: Self-pay

## 2024-07-15 MED ORDER — FARXIGA 5 MG PO TABS: 5.0000 mg | ORAL_TABLET | Freq: Every day | ORAL | 3 refills | Status: AC

## 2024-07-15 NOTE — Telephone Encounter (Signed)
 Prescription sent to [facility]

## 2024-08-04 ENCOUNTER — Other Ambulatory Visit: Payer: Self-pay | Admitting: Nurse Practitioner

## 2024-08-20 DIAGNOSIS — Z23 Encounter for immunization: Secondary | ICD-10-CM | POA: Diagnosis not present

## 2024-08-22 DIAGNOSIS — Z08 Encounter for follow-up examination after completed treatment for malignant neoplasm: Secondary | ICD-10-CM | POA: Diagnosis not present

## 2024-08-22 DIAGNOSIS — W908XXS Exposure to other nonionizing radiation, sequela: Secondary | ICD-10-CM | POA: Diagnosis not present

## 2024-08-22 DIAGNOSIS — Z129 Encounter for screening for malignant neoplasm, site unspecified: Secondary | ICD-10-CM | POA: Diagnosis not present

## 2024-08-22 DIAGNOSIS — L814 Other melanin hyperpigmentation: Secondary | ICD-10-CM | POA: Diagnosis not present

## 2024-08-22 DIAGNOSIS — D1801 Hemangioma of skin and subcutaneous tissue: Secondary | ICD-10-CM | POA: Diagnosis not present

## 2024-08-22 DIAGNOSIS — D692 Other nonthrombocytopenic purpura: Secondary | ICD-10-CM | POA: Diagnosis not present

## 2024-08-22 DIAGNOSIS — L570 Actinic keratosis: Secondary | ICD-10-CM | POA: Diagnosis not present

## 2024-08-22 DIAGNOSIS — Z85820 Personal history of malignant melanoma of skin: Secondary | ICD-10-CM | POA: Diagnosis not present

## 2024-08-22 DIAGNOSIS — L578 Other skin changes due to chronic exposure to nonionizing radiation: Secondary | ICD-10-CM | POA: Diagnosis not present

## 2024-08-23 NOTE — Progress Notes (Signed)
 HPI: FU coronary artery disease. Lower extremity Dopplers February 2016 showed an occluded right anterior tibial artery. Abdominal ultrasound February 2016 showed no aneurysm. Patient had cardiac catheterization May 2017 secondary to continued exertional chest pain. He was found to have severe three-vessel coronary artery disease and normal LV function. Echocardiogram showed normal LV systolic function, grade 1 diastolic dysfunction and mildly dilated aortic root. Note carotid Dopplers May 2017 prior to surgery showed 1-39% bilateral stenosis. Patient had coronary artery bypass and graft with LIMA to the LAD, saphenous vein graft to second diagonal, sequential saphenous vein graft to the intermediate and distal circumflex, and sequential saphenous vein graft to the right coronary artery and PDA.  ABIs August 2024 normal bilaterally with abnormal toe brachial index.  Since he was last seen, patient has had some difficulties with reflux recently as he has run out of Protonix .  However he denies dyspnea on exertion, orthopnea, PND, pedal edema, exertional chest pain or syncope.  Current Outpatient Medications  Medication Sig Dispense Refill   aspirin  81 MG tablet Take 81 mg by mouth daily.     atorvastatin  (LIPITOR) 80 MG tablet TAKE 1 TABLET BY MOUTH EVERYDAY AT BEDTIME 90 tablet 3   Blood Glucose Monitoring Suppl (ONETOUCH VERIO FLEX SYSTEM) w/Device KIT USE TO CHECK BLOOD SUGAR ONCE A DAY AND AS NEEDED. DX CODE: E11.8 1 kit 0   FARXIGA  5 MG TABS tablet Take 1 tablet (5 mg total) by mouth daily before breakfast. 90 tablet 3   glipiZIDE  (GLUCOTROL ) 10 MG tablet Take 2 tablets (20 mg total) by mouth 2 (two) times daily before a meal. 360 tablet 3   levothyroxine  (SYNTHROID ) 112 MCG tablet TAKE 1 TABLET BY MOUTH EVERY DAY 90 tablet 1   metFORMIN  (GLUCOPHAGE -XR) 500 MG 24 hr tablet Take 1 tablet (500 mg total) by mouth in the morning and at bedtime. 180 tablet 3   OneTouch Delica Lancets 30G MISC 1  Device by Does not apply route daily in the afternoon. 100 each 3   ONETOUCH VERIO test strip USE TO CHECK BLOOD SUGAR ONCE A DAY AND AS NEEDED. DX CODE: E11.8 100 strip 1   pantoprazole  (PROTONIX ) 40 MG tablet Take 1 tablet (40 mg total) by mouth daily. Take 30 minutes before breakfast. 30 tablet 0   Current Facility-Administered Medications  Medication Dose Route Frequency Provider Last Rate Last Admin   0.9 %  sodium chloride  infusion  500 mL Intravenous Once Charlanne Groom, MD         Past Medical History:  Diagnosis Date   Aortic calcification (HCC) 02/09/2015   Appendicitis    Back pain 02/09/2015   CAD (coronary artery disease)    2 stents   CAD, multiple vessel 05/17/2016   CABG x6 vessel   Cataract    bilateral   Chicken pox    Diabetes mellitus type 2 in obese 04/11/2016   Diabetes type 2, controlled (HCC)    Dysphagia, unspecified(787.20) 03/20/2014   Hereditary and idiopathic peripheral neuropathy 02/09/2015   Hyperlipidemia    Hypertension    Hypothyroidism    Light headedness 07/04/2017   Measles    Medicare annual wellness visit, subsequent 06/14/2015   Melanoma (HCC)    Scalp. 2012   Mumps as a child   Thyroid  disease    Beoming Hyperthyroidism    Past Surgical History:  Procedure Laterality Date   APPENDECTOMY     BACK SURGERY     x2 lumbar  BACK SURGERY     late 1990s had 2 surgeries, first surgery lifted a heavy engine ruptured disds, at L4 and L5, cleaned discs no hardware very helpful. 2 years later while recovering from angiogram with weights applied to left leg caused a recurrence and required surgery again in same area with disc repair   BIOPSY  12/13/2022   Procedure: BIOPSY;  Surgeon: Charlanne Groom, MD;  Location: [facility];  Service: Gastroenterology;;   CARDIAC CATHETERIZATION     CARDIAC CATHETERIZATION N/A 05/11/2016   Procedure: Left Heart Cath and Coronary Angiography;  Surgeon: Victory LELON Sharps, MD;  Location: [facility] INVASIVE CV LAB;   Service: Cardiovascular;  Laterality: N/A;   CARDIAC CATHETERIZATION N/A 05/11/2016   Procedure: Intravascular Pressure Wire/FFR Study;  Surgeon: Victory LELON Sharps, MD;  Location: [facility] INVASIVE CV LAB;  Service: Cardiovascular;  Laterality: N/A;   COLONOSCOPY WITH PROPOFOL  N/A 12/13/2022   Procedure: COLONOSCOPY WITH PROPOFOL ;  Surgeon: Charlanne Groom, MD;  Location: [facility];  Service: Gastroenterology;  Laterality: N/A;   CORONARY ANGIOPLASTY WITH STENT PLACEMENT     2 stents   CORONARY ARTERY BYPASS GRAFT N/A 05/17/2016   Procedure: CORONARY ARTERY BYPASS GRAFTING (CABG) x 6 using left mammory artery and right greater saphenous vein harvested endoscopically. [place] to LAD, SVG to Diagonal, SVG Sequential to OM2 and ramus intermediate, SVG Sequential to RCA and PDA;  Surgeon: Dallas KATHEE Jude, MD;  Location: [facility] OR;  Service: Open Heart Surgery;  Laterality: N/A;   ESOPHAGOGASTRODUODENOSCOPY (EGD) WITH PROPOFOL  N/A 12/13/2022   Procedure: ESOPHAGOGASTRODUODENOSCOPY (EGD) WITH PROPOFOL ;  Surgeon: Charlanne Groom, MD;  Location: [facility];  Service: Gastroenterology;  Laterality: N/A;   KNEE ARTHROSCOPY     right knee   LUMBAR LAMINECTOMY/DECOMPRESSION MICRODISCECTOMY Bilateral 04/10/2015   Procedure: LUMBAR LAMINECTOMY/DECOMPRESSION MICRODISCECTOMY 2 LEVELS;  Surgeon: Rockey Peru, MD;  Location: [facility];  Service: Neurosurgery;  Laterality: Bilateral;  Bilateral L45 L5S1 Laminectomy and Foraminotomy   MALONEY DILATION  12/13/2022   Procedure: MALONEY DILATION;  Surgeon: Charlanne Groom, MD;  Location: [facility];  Service: Gastroenterology;;   POLYPECTOMY  12/13/2022   Procedure: POLYPECTOMY;  Surgeon: Charlanne Groom, MD;  Location: [facility];  Service: Gastroenterology;;   ROTATOR CUFF REPAIR Right    SKIN CANCER EXCISION     melanoma on scalp   TEE WITHOUT CARDIOVERSION N/A 05/17/2016   Procedure: TRANSESOPHAGEAL ECHOCARDIOGRAM (TEE);  Surgeon: Dallas KATHEE Jude, MD;  Location: [facility] OR;   Service: Open Heart Surgery;  Laterality: N/A;   TONSILLECTOMY AND ADENOIDECTOMY     WISDOM TOOTH EXTRACTION      Social History   Socioeconomic History   Marital status: Married    Spouse name: Not on file   Number of children: 2   Years of education: Not on file   Highest education level: Not on file  Occupational History   Occupation: retired  Tobacco Use   Smoking status: Former    Current packs/day: 2.00    Average packs/day: 2.0 packs/day for 40.7 years (81.4 ttl pk-yrs)    Types: Cigarettes    Start date: 12/27/1983   Smokeless tobacco: Never  Vaping Use   Vaping status: Never Used  Substance and Sexual Activity   Alcohol use: Yes    Alcohol/week: 0.0 standard drinks of alcohol    Comment: occasionally   Drug use: No   Sexual activity: Never    Comment: lives with wife. retired, no dietary restrictions, Public relations account executive.   Other Topics Concern   Not on file  Social History Narrative   Not on file   Social Drivers of Health   Financial Resource Strain: Not on file  Food Insecurity: Not on file  Transportation Needs: Not on file  Physical Activity: Not on file  Stress: Not on file  Social Connections: Not on file  Intimate Partner Violence: Not on file    Family History  Problem Relation Age of Onset   Heart disease Father    Emphysema Father    Hypertension Father    Cancer Father        lung/ healthy   Heart disease Mother        Deceased   Cancer Mother        breast   Hypertension Mother    Cancer Maternal Grandmother        colon   Diabetes Brother        type 2   Heart disease Brother    Pulmonary embolism Son        vasculiti   Depression Paternal Grandmother    Depression Paternal Aunt     ROS: no fevers or chills, productive cough, hemoptysis, dysphasia, odynophagia, melena, hematochezia, dysuria, hematuria, rash, seizure activity, orthopnea, PND, pedal edema, claudication. Remaining systems are negative.  Physical Exam: Well-developed  well-nourished in no acute distress.  Skin is warm and dry.  HEENT is normal.  Neck is supple.  Chest is clear to auscultation with normal expansion.  Cardiovascular exam is regular rate and rhythm.  Abdominal exam nontender or distended. No masses palpated. Extremities show no edema. neuro grossly intact  EKG Interpretation Date/Time:  Wednesday September 04 2024 10:27:40 EDT Ventricular Rate:  73 PR Interval:  262 QRS Duration:  140 QT Interval:  410 QTC Calculation: 451 R Axis:   -82  Text Interpretation: Sinus rhythm with 1st degree A-V block Right bundle branch block Left anterior fascicular block Bifascicular block Confirmed by Pietro Rogue ([id number]) on 09/04/2024 10:28:48 AM    A/P  1 coronary artery disease-patient denies chest pain.  Continue medical therapy with aspirin  and statin.  2 hyperlipidemia-continue statin.  3 history of orthostasis-patient denies recent symptoms.  Plan to continue increase hydration.  4 peripheral vascular disease-patient is not having claudication.  Continue aspirin  and statin.  Rogue Pietro, MD

## 2024-09-02 ENCOUNTER — Telehealth: Payer: Self-pay | Admitting: Gastroenterology

## 2024-09-02 ENCOUNTER — Other Ambulatory Visit: Payer: Self-pay | Admitting: Nurse Practitioner

## 2024-09-02 MED ORDER — PANTOPRAZOLE SODIUM 40 MG PO TBEC: 40.0000 mg | DELAYED_RELEASE_TABLET | Freq: Every day | ORAL | 0 refills | Status: DC

## 2024-09-02 NOTE — Telephone Encounter (Signed)
 PT is calling to have a refill for pantoparzole sent to [facility]. Please advise.

## 2024-09-02 NOTE — Telephone Encounter (Signed)
 Done

## 2024-09-04 ENCOUNTER — Encounter: Payer: Self-pay | Admitting: Cardiology

## 2024-09-04 ENCOUNTER — Ambulatory Visit: Attending: Cardiology | Admitting: Cardiology

## 2024-09-04 VITALS — BP 134/74 | HR 73 | Ht 72.0 in | Wt 187.0 lb

## 2024-09-04 DIAGNOSIS — I739 Peripheral vascular disease, unspecified: Secondary | ICD-10-CM | POA: Insufficient documentation

## 2024-09-04 DIAGNOSIS — E7800 Pure hypercholesterolemia, unspecified: Secondary | ICD-10-CM | POA: Diagnosis not present

## 2024-09-04 DIAGNOSIS — I2510 Atherosclerotic heart disease of native coronary artery without angina pectoris: Secondary | ICD-10-CM | POA: Diagnosis not present

## 2024-09-04 NOTE — Patient Instructions (Signed)

## 2024-09-09 NOTE — Progress Notes (Unsigned)
 09/10/2024 Elsie JAYSON Hock [id number] 1941-03-31  Referring provider: Domenica Harlene LABOR, MD Primary GI doctor: Dr. Charlanne  ASSESSMENT AND PLAN:  Dysphagia 12/13/2022 EGD benign-appearing esophageal stenosis dilated, grade C reflux esophagitis, 2 cm HH Path negative H. pylori no metaplasia recommend EGD with biopsies of distal esophagus rule out Barrett's and potential repeat dilation. Has been on pantoprazole  40 mg daily since that time, ran out several weeks ago with now intermittent dysphagia and reflux, denies weight loss, melena No NSAIDS, tobacco, ETOH -Restart the pantoprazole  40 mg daily -alginate therapy given -Lifestyle changes discussed, avoid NSAIDS, ETOH, hand out given to the patient -Schedule EGD at [facility] to evaluate GERD, esophagitis, hiatal hernia, Barrett's. I discussed risks of EGD with patient today, including risk of sedation, bleeding or perforation. Patient provides understanding and gave verbal consent to proceed.  Constipation Has BM once a week, a lot of stool, formed regular stools No associated nausea, AB pain, bloating - Increase fiber/ water intake, decrease caffeine, increase activity level. -Will add on Miralax daily/benefiber - Order abdominal ultrasound for left lower quadrant mass, likely stool, non tender  Personal history of colon polyps 12/13/2022 colonoscopy Dr. Charlanne adequate prep for polyps over 5 mm but did have retained stool 2 polyps 4 to 8 mm distal sigmoid and ascending colon, mild pancolonic diverticulosis nonbleeding internal hemorrhoids no repeat colonoscopy for screening purposes  CAD Status post DES x 2 Status post 6 vessel CABG 04/2016 Follows with Dr. Pietro On bASA, no CP no SOB  Type 2 diabetes with neuropathy On glipizide , farxiga , and metformin   History of malignant melanoma 2012  Patient Care Team: Domenica Harlene LABOR, MD as PCP - General (Family Medicine) Gillie Duncans, MD as Consulting Physician  (Neurosurgery) Pietro, Redell RAMAN, MD as Consulting Physician (Cardiology)  HISTORY OF PRESENT ILLNESS: 83 y.o. male with a past medical history listed below presents for evaluation of dysphagia/GERD.   Patient last seen in the office 10/20/2022 by Elida Nyle Sharps, NP for dysphagia.  Discussed the use of AI scribe software for clinical note transcription with the patient, who gave verbal consent to proceed.  History of Present Illness   Larwence Tu is an 83 year old male with grade C reflux esophagitis and a hiatal hernia who presents with trouble swallowing and reflux symptoms.  He initially presented in October 2023 with trouble swallowing and underwent an endoscopy in December 2023, which revealed a narrowing that was dilated, grade C reflux esophagitis, and a 2 cm hiatal hernia. Pathology showed no dysplasia, metaplasia, or H. pylori. He was started on pantoprazole , which improved his symptoms, but he ran out of the medication a few weeks ago.  Since stopping pantoprazole , he experienced intermittent trouble swallowing and a severe episode of reflux that felt like a heart attack, which was relieved by Tums. He also reported a sensation of fogginess and intermittent swallowing difficulties, which have improved in the last few days. No painful swallowing, chest pain, shortness of breath, or weight loss.  He experiences constipation, having bowel movements once a week, which he describes as large but not hard or requiring straining. No abdominal pain, gas, bloating, nausea, or blood in the stool. This pattern has been present for the last ten years.  He is on glipizide , metformin , and Farxiga  for diabetes. He does not smoke, consume alcohol, or use NSAIDs.     He  reports that he has quit smoking. His smoking use included cigarettes. He started smoking about 40  years ago. He has a 81.4 pack-year smoking history. He has never used smokeless tobacco. He reports current alcohol  use. He reports that he does not use drugs.  RELEVANT GI HISTORY, IMAGING AND LABS: Results   RADIOLOGY Abdominal CT: Gallstones, no inflammation (02/2022)  DIAGNOSTIC Endoscopy: Narrowing of esophagus, grade C reflux esophagitis, 2 cm hiatal hernia (11/2022)  PATHOLOGY Esophageal biopsy: No dysplasia, no metaplasia, no H. pylori, inflammation present (11/2022)     EGD 12/13/2022 - Benign- appearing esophageal stenosis. Dilated. Biopsied. - LA Grade C reflux esophagitis with no bleeding. Biopsied. - 2 cm hiatal hernia. - Normal stomach. Biopsied. - Normal examined duodenum. Negative H. pylori negative intestinal metaplasia Recommend repeat EGD 12 weeks Colonoscopy 12/13/2022 - Two 4 to 8 mm TA polyps in the distal sigmoid colon and in the mid ascending colon, removed with a cold snare. Resected and retrieved. - Mild pancolonic diverticulosis. - Non- bleeding internal hemorrhoids. - The examination was otherwise normal on direct and retroflexion views.  EGD 04/30/2021: - Benign-appearing esophageal stenosis. Dilated. Biopsied. - 2 cm hiatal hernia. - Presbyesophagus Surgical [P], distal esophagus - GASTROESOPHAGEAL MUCOSA WITH INFLAMMATION CONSISTENT WITH REFLUX. - NO INTESTINAL METAPLASIA, DYSPLASIA, OR CARCINOMA.   Colonoscopy 10/17/2018: A 8 mm polyp was found in the mid ascending colon. The polyp was sessile. The polyp was removed with a cold snare. Resection and retrieval were complete. Estimated blood loss: none. - Two sessile polyps were found in the descending colon. The polyps were 6 to 8 mm in size. These polyps were removed with a cold snare. Resection and retrieval were complete. Estimated blood loss was minimal. - A few small-mouthed diverticula were found in the sigmoid colon and ascending colon. - Non-bleeding internal hemorrhoids were found during retroflexion. The hemorrhoids were small. - The exam was otherwise without abnormality on direct and retroflexion  views. -Colonic polyps status post polypectomy -Mild pancolonic diverticulosis -Otherwise normal colonoscopy. - 3 year colonoscopy recall Path report: 1. Surgical [P], ascending, polyp - TUBULAR ADENOMA(S). - HIGH GRADE DYSPLASIA IS NOT IDENTIFIED. 2. Surgical [P], descending, polyp (2) - TUBULAR ADENOMA(S). - HYPERPLASTIC POLYP(S). - HIGH GRADE DYSPLASIA IS NOT IDENTIFIED  CBC    Component Value Date/Time   WBC 8.1 01/18/2024 0908   RBC 5.37 01/18/2024 0908   HGB 14.3 01/18/2024 0908   HCT 44.9 01/18/2024 0908   PLT 243.0 01/18/2024 0908   MCV 83.6 01/18/2024 0908   MCH 26.8 03/15/2022 0918   MCHC 31.9 01/18/2024 0908   RDW 14.8 01/18/2024 0908   LYMPHSABS 1.3 01/18/2024 0908   MONOABS 0.8 01/18/2024 0908   EOSABS 0.3 01/18/2024 0908   BASOSABS 0.1 01/18/2024 0908   Recent Labs    01/18/24 0908  HGB 14.3    CMP     Component Value Date/Time   NA 139 01/18/2024 0908   K 4.1 01/18/2024 0908   CL 105 01/18/2024 0908   CO2 26 01/18/2024 0908   GLUCOSE 218 (H) 01/18/2024 0908   BUN 22 01/18/2024 0908   CREATININE 1.52 (H) 01/18/2024 0908   CREATININE 1.40 (H) 01/10/2024 1025   CALCIUM  9.7 01/18/2024 0908   PROT 7.0 01/18/2024 0908   ALBUMIN  4.4 01/18/2024 0908   AST 20 01/18/2024 0908   ALT 16 01/18/2024 0908   ALKPHOS 90 01/18/2024 0908   BILITOT 0.6 01/18/2024 0908   GFRNONAA 49 (L) 03/15/2022 0918   GFRNONAA 75 12/12/2013 1156   GFRAA 46 (L) 10/01/2016 0925   GFRAA 87 12/12/2013 1156  Latest Ref Rng & Units 01/18/2024    9:08 AM 07/18/2023   10:19 AM 03/07/2023    9:38 AM  Hepatic Function  Total Protein 6.0 - 8.3 g/dL 7.0  6.9  7.0   Albumin  3.5 - 5.2 g/dL 4.4  4.1  4.1   AST 0 - 37 U/L 20  19  19    ALT 0 - 53 U/L 16  14  15    Alk Phosphatase 39 - 117 U/L 90  109  103   Total Bilirubin 0.2 - 1.2 mg/dL 0.6  0.6  0.7       Current Medications:   Current Outpatient Medications (Endocrine & Metabolic):    FARXIGA  5 MG TABS tablet, Take 1  tablet (5 mg total) by mouth daily before breakfast.   glipiZIDE  (GLUCOTROL ) 10 MG tablet, Take 2 tablets (20 mg total) by mouth 2 (two) times daily before a meal.   levothyroxine  (SYNTHROID ) 112 MCG tablet, TAKE 1 TABLET BY MOUTH EVERY DAY   metFORMIN  (GLUCOPHAGE -XR) 500 MG 24 hr tablet, Take 1 tablet (500 mg total) by mouth in the morning and at bedtime.   Current Outpatient Medications (Cardiovascular):    atorvastatin  (LIPITOR) 80 MG tablet, TAKE 1 TABLET BY MOUTH EVERYDAY AT BEDTIME     Current Outpatient Medications (Analgesics):    aspirin  81 MG tablet, Take 81 mg by mouth daily.     Current Outpatient Medications (Other):    Blood Glucose Monitoring Suppl (ONETOUCH VERIO FLEX SYSTEM) w/Device KIT, USE TO CHECK BLOOD SUGAR ONCE A DAY AND AS NEEDED. DX CODE: E11.8   OneTouch Delica Lancets 30G MISC, 1 Device by Does not apply route daily in the afternoon.   ONETOUCH VERIO test strip, USE TO CHECK BLOOD SUGAR ONCE A DAY AND AS NEEDED. DX CODE: E11.8   pantoprazole  (PROTONIX ) 40 MG tablet, Take 1 tablet (40 mg total) by mouth daily. Take 30 minutes before breakfast.  Current Facility-Administered Medications (Other):    0.9 %  sodium chloride  infusion  Medical History:  Past Medical History:  Diagnosis Date   Aortic calcification (HCC) 02/09/2015   Appendicitis    Back pain 02/09/2015   CAD (coronary artery disease)    2 stents   CAD, multiple vessel 05/17/2016   CABG x6 vessel   Cataract    bilateral   Chicken pox    Diabetes mellitus type 2 in obese 04/11/2016   Diabetes type 2, controlled (HCC)    Dysphagia, unspecified(787.20) 03/20/2014   Hereditary and idiopathic peripheral neuropathy 02/09/2015   Hyperlipidemia    Hypertension    Hypothyroidism    Light headedness 07/04/2017   Measles    Medicare annual wellness visit, subsequent 06/14/2015   Melanoma (HCC)    Scalp. 2012   Mumps as a child   Thyroid  disease    Beoming Hyperthyroidism   Allergies: No  Known Allergies   Surgical History:  He  has a past surgical history that includes Appendectomy; Coronary angioplasty with stent; Cardiac catheterization; Back surgery; Rotator cuff repair (Right); Wisdom tooth extraction; Tonsillectomy and adenoidectomy; Skin cancer excision; Back surgery; Knee arthroscopy; Lumbar laminectomy/decompression microdiscectomy (Bilateral, 04/10/2015); Cardiac catheterization (N/A, 05/11/2016); Cardiac catheterization (N/A, 05/11/2016); Coronary artery bypass graft (N/A, 05/17/2016); TEE without cardioversion (N/A, 05/17/2016); Colonoscopy with propofol  (N/A, 12/13/2022); Esophagogastroduodenoscopy (egd) with propofol  (N/A, 12/13/2022); biopsy (12/13/2022); maloney dilation (12/13/2022); and polypectomy (12/13/2022). Family History:  His family history includes Cancer in his father, maternal grandmother, and mother; Depression in his paternal aunt and paternal grandmother;  Diabetes in his brother; Emphysema in his father; Heart disease in his brother, father, and mother; Hypertension in his father and mother; Pulmonary embolism in his son.  REVIEW OF SYSTEMS  : All other systems reviewed and negative except where noted in the History of Present Illness.  PHYSICAL EXAM: BP 124/86   Pulse 72   Ht 6' (1.829 m)   Wt 189 lb 2 oz (85.8 kg)   BMI 25.65 kg/m  Physical Exam   GENERAL APPEARANCE: Well nourished, in no apparent distress. HEENT: No cervical lymphadenopathy, unremarkable thyroid , sclerae anicteric, conjunctiva pink. RESPIRATORY: Respiratory effort normal, breath sounds equal bilaterally without rales, rhonchi, or wheezing. CARDIO: Regular rate and rhythm with no murmurs, rubs, or gallops, peripheral pulses intact. ABDOMEN: Soft, non-distended, active bowel sounds in all four quadrants, non-tender to palpation, palpable mobile mass in left lower quadrant, likely stool, no rebound. RECTAL: Declines. MUSCULOSKELETAL: Full range of motion, normal gait, without  edema. SKIN: Dry, intact without rashes or lesions. No jaundice. NEURO: Alert, oriented, no focal deficits. PSYCH: Cooperative, normal mood and affect.      Alan JONELLE Coombs, PA-C 9:50 AM

## 2024-09-10 ENCOUNTER — Encounter: Payer: Self-pay | Admitting: Physician Assistant

## 2024-09-10 ENCOUNTER — Ambulatory Visit (INDEPENDENT_AMBULATORY_CARE_PROVIDER_SITE_OTHER): Admitting: Physician Assistant

## 2024-09-10 VITALS — BP 124/86 | HR 72 | Ht 72.0 in | Wt 189.1 lb

## 2024-09-10 DIAGNOSIS — K2100 Gastro-esophageal reflux disease with esophagitis, without bleeding: Secondary | ICD-10-CM | POA: Diagnosis not present

## 2024-09-10 DIAGNOSIS — E1122 Type 2 diabetes mellitus with diabetic chronic kidney disease: Secondary | ICD-10-CM

## 2024-09-10 DIAGNOSIS — I25810 Atherosclerosis of coronary artery bypass graft(s) without angina pectoris: Secondary | ICD-10-CM

## 2024-09-10 DIAGNOSIS — R1310 Dysphagia, unspecified: Secondary | ICD-10-CM

## 2024-09-10 DIAGNOSIS — Z7984 Long term (current) use of oral hypoglycemic drugs: Secondary | ICD-10-CM

## 2024-09-10 DIAGNOSIS — E1140 Type 2 diabetes mellitus with diabetic neuropathy, unspecified: Secondary | ICD-10-CM

## 2024-09-10 DIAGNOSIS — Z860100 Personal history of colon polyps, unspecified: Secondary | ICD-10-CM

## 2024-09-10 DIAGNOSIS — Z85820 Personal history of malignant melanoma of skin: Secondary | ICD-10-CM

## 2024-09-10 DIAGNOSIS — R1904 Left lower quadrant abdominal swelling, mass and lump: Secondary | ICD-10-CM

## 2024-09-10 DIAGNOSIS — K5900 Constipation, unspecified: Secondary | ICD-10-CM | POA: Diagnosis not present

## 2024-09-10 DIAGNOSIS — N1831 Chronic kidney disease, stage 3a: Secondary | ICD-10-CM

## 2024-09-10 MED ORDER — PANTOPRAZOLE SODIUM 40 MG PO TBEC: 40.0000 mg | DELAYED_RELEASE_TABLET | Freq: Every day | ORAL | 3 refills | Status: AC

## 2024-09-10 NOTE — Patient Instructions (Addendum)
 You have been scheduled for an abdominal ultrasound at [facility] on Tuesday, 09/17/24 at 10:00 am. Please arrive 30 minutes prior to your appointment for registration. Make certain not to have anything to eat or drink after midnight prior to your appointment. Should you need to reschedule your appointment, please contact radiology at [phone]. This test typically takes about 30 minutes to perform.   You have been scheduled for an endoscopy. Please follow written instructions given to you at your visit today.  If you use inhalers (even only as needed), please bring them with you on the day of your procedure.  If you take any of the following medications, they will need to be adjusted prior to your procedure:   DO NOT TAKE 7 DAYS PRIOR TO TEST- Trulicity (dulaglutide) Ozempic, Wegovy (semaglutide) Mounjaro (tirzepatide) Bydureon Bcise (exanatide extended release)  DO NOT TAKE 1 DAY PRIOR TO YOUR TEST Rybelsus (semaglutide) Adlyxin (lixisenatide) Victoza (liraglutide) Byetta (exanatide) ___________________________________________________________________________  Please take your proton pump inhibitor medication, pantoprazole  Please take this medication 30 minutes to 1 hour before meals- this makes it more effective.  Avoid spicy and acidic foods Avoid fatty foods Limit your intake of coffee, tea, alcohol, and carbonated drinks Work to maintain a healthy weight Keep the head of the bed elevated at least 3 inches with blocks or a wedge pillow if you are having any nighttime symptoms Stay upright for 2 hours after eating Avoid meals and snacks three to four hours before bedtime. Thank you for trusting me with your gastrointestinal care!   Alan Coombs, PA-C  _______________________________________________________  If your blood pressure at your visit was 140/90 or greater, please contact your primary care physician to follow up on  this.  _______________________________________________________  If you are age 50 or older, your body mass index should be between 23-30. Your Body mass index is 25.65 kg/m. If this is out of the aforementioned range listed, please consider follow up with your Primary Care Provider.  If you are age 2 or younger, your body mass index should be between 19-25. Your Body mass index is 25.65 kg/m. If this is out of the aformentioned range listed, please consider follow up with your Primary Care Provider.   ________________________________________________________  The [facility] providers would like to encourage you to use MYCHART to communicate with providers for non-urgent requests or questions.  Due to long hold times on the telephone, sending your provider a message by [facility] may be a faster and more efficient way to get a response.  Please allow 48 business hours for a response.  Please remember that this is for non-urgent requests.  _______________________________________________________  [facility] is using a team-based approach to care.  Your team is made up of your doctor and two to three APPS. Our APPS (Nurse Practitioners and Physician Assistants) work with your physician to ensure care continuity for you. They are fully qualified to address your health concerns and develop a treatment plan. They communicate directly with your gastroenterologist to care for you. Seeing the Advanced Practice Practitioners on your physician's team can help you by facilitating care more promptly, often allowing for earlier appointments, access to diagnostic testing, procedures, and other specialty referrals.    Reflux Gourmet Rescue  It is an ALGINATE THERAPY which is the only intervention that works to safeguard the esophagus by creating a protective barrier that actually stops reflux from happening. -The general directions for use are as stated on the packaging: Take 1 teaspoon (5 ml), or  more as needed or as directed by your physician, after meals and before bed. -These general directions address the most common times for reflux to occur, but our Rescue products may be taken anytime. Some individuals may take our product preemptively, when they know they will suffer from reflux, or as needed - when discomfort arises. (If taken around food, it should be consumed last.) -You do not have to take 1 teaspoon (5 ml) of the product. While one teaspoon (5ml) may be the perfect average amount to relieve reflux suffering in some, others may require more or less. You may adjust the amount of Mint Chocolate Rescue and Vanilla Caramel Rescue to the lowest amount necessary to meet your individual needs to improve your quality of life. -You may dilute the product if it is too viscous for you to consume. Keep in mind, however, that the thickness of the product was formulated to provide optimal coating and protection of your throat and esophagus. Though diluting the product is possible, it may reduce the protective function and/or length of action. -This can be used in conjunction with reflux medications and lifestyle changes.  100% ALL-NATURAL  Paraben FREE, glycerin FREE, & potassium FREE  Made entirely from all-natural ingredients considered safe for children and during pregnancy  No known side effects  All-natural flavor Gluten FREE  Allergen FREE  Vegan  Can find more information here: NameSeizer.co.nz   Miralax is an osmotic laxative.  It only brings more water into the stool.  This is safe to take daily.  Can take up to 17 gram of miralax twice a day.  Mix with juice or coffee.  Start 1 capful at night for 3-4 days and reassess your response in 3-4 days.  You can increase and decrease the dose based on your response.  Remember, it can take up to 3-4 days to take effect OR for the effects to wear off.   I often pair this with benefiber in the  morning to help assure the stool is not too loose.   FIBER SUPPLEMENT You can do metamucil or fibercon once or twice a day but if this causes gas/bloating please switch to Benefiber or Citracel.  Fiber is good for constipation/diarrhea/irritable bowel syndrome.  It can also help with weight loss and can help lower your bad cholesterol (LDL).  Please do 1 TBSP in the morning in water, coffee, or tea.  It can take up to a month before you can see a difference with your bowel movements.  It is cheapest from [employer], [employer], [employer].   Toileting tips to help with your constipation - Drink at least 64-80 ounces of water/liquid per day. - Establish a time to try to move your bowels every day.  For many people, this is after a cup of coffee or after a meal such as breakfast. - Sit all of the way back on the toilet keeping your back fairly straight and while sitting up, try to rest the tops of your forearms on your upper thighs.   - Raising your feet with a step stool/squatty potty can be helpful to improve the angle that allows your stool to pass through the rectum. - Relax the rectum feeling it bulge toward the toilet water.  If you feel your rectum raising toward your body, you are contracting rather than relaxing. - Breathe in and slowly exhale. Belly breath by expanding your belly towards your belly button. Keep belly expanded as you gently direct pressure down and back to  the anus.  A low pitched GRRR sound can assist with increasing intra-abdominal pressure.  (Can also trying to blow on a pinwheel and make it move, this helps with the same belly breathing) - Repeat 3-4 times. If unsuccessful, contract the pelvic floor to restore normal tone and get off the toilet.  Avoid excessive straining. - To reduce excessive wiping by teaching your anus to normally contract, place hands on outer aspect of knees and resist knee movement outward.  Hold 5-10 second then place hands just inside of knees and resist  inward movement of knees.  Hold 5 seconds.  Repeat a few times each way.  Go to the ER if unable to pass gas, severe AB pain, unable to hold down food, any shortness of breath of chest pain.

## 2024-09-17 ENCOUNTER — Other Ambulatory Visit (HOSPITAL_BASED_OUTPATIENT_CLINIC_OR_DEPARTMENT_OTHER)

## 2024-09-17 ENCOUNTER — Ambulatory Visit (HOSPITAL_BASED_OUTPATIENT_CLINIC_OR_DEPARTMENT_OTHER)
Admission: RE | Admit: 2024-09-17 | Discharge: 2024-09-17 | Disposition: A | Discharge status: Home / Self Care | Source: Ambulatory Visit | Attending: Physician Assistant | Admitting: Physician Assistant

## 2024-09-17 ENCOUNTER — Encounter: Payer: Self-pay | Admitting: Internal Medicine

## 2024-09-17 ENCOUNTER — Ambulatory Visit: Admitting: Internal Medicine

## 2024-09-17 VITALS — BP 136/70 | HR 84 | Ht 72.0 in

## 2024-09-17 DIAGNOSIS — E1159 Type 2 diabetes mellitus with other circulatory complications: Secondary | ICD-10-CM | POA: Diagnosis not present

## 2024-09-17 DIAGNOSIS — N1831 Chronic kidney disease, stage 3a: Secondary | ICD-10-CM | POA: Diagnosis not present

## 2024-09-17 DIAGNOSIS — E1165 Type 2 diabetes mellitus with hyperglycemia: Secondary | ICD-10-CM | POA: Diagnosis not present

## 2024-09-17 DIAGNOSIS — E1122 Type 2 diabetes mellitus with diabetic chronic kidney disease: Secondary | ICD-10-CM | POA: Diagnosis not present

## 2024-09-17 DIAGNOSIS — R1904 Left lower quadrant abdominal swelling, mass and lump: Secondary | ICD-10-CM | POA: Insufficient documentation

## 2024-09-17 DIAGNOSIS — E1142 Type 2 diabetes mellitus with diabetic polyneuropathy: Secondary | ICD-10-CM

## 2024-09-17 LAB — POCT GLYCOSYLATED HEMOGLOBIN (HGB A1C): Hemoglobin A1C: 8.2 % — AB (ref 4.0–5.6)

## 2024-09-17 LAB — POCT GLUCOSE (DEVICE FOR HOME USE): POC Glucose: 260 mg/dL — AB (ref 70–99)

## 2024-09-17 MED ORDER — GLIPIZIDE 10 MG PO TABS: 20.0000 mg | ORAL_TABLET | Freq: Two times a day (BID) | ORAL | 3 refills | Status: DC

## 2024-09-17 NOTE — Patient Instructions (Signed)
 Take  Glipizide  10 mg, 2 tablet before breakfast and 2 tablet before Supper  Continue Farxiga  5 mg, 1 tablet in the morning  Continue Metformin  500 mg ,1 tablet twice a day    HOW TO TREAT LOW BLOOD SUGARS (Blood sugar LESS THAN 70 MG/DL) Please follow the RULE OF 15 for the treatment of hypoglycemia treatment (when your (blood sugars are less than 70 mg/dL)   STEP 1: Take 15 grams of carbohydrates when your blood sugar is low, which includes:  3-4 GLUCOSE TABS  OR 3-4 OZ OF JUICE OR REGULAR SODA OR ONE TUBE OF GLUCOSE GEL    STEP 2: RECHECK blood sugar in 15 MINUTES STEP 3: If your blood sugar is still low at the 15 minute recheck --> then, go back to STEP 1 and treat AGAIN with another 15 grams of carbohydrates.

## 2024-09-17 NOTE — Progress Notes (Signed)
 Name: Jack Wade  MRN/ DOB: [id number], [birth year]   Age/ Sex: 83 y.o., male    PCP: Domenica Harlene LABOR, MD   Reason for Endocrinology Evaluation: Type 2 Diabetes Mellitus     Date of Initial Endocrinology Visit: 10/19/2021    PATIENT IDENTIFIER: Jack Wade is a 83 y.o. male with a past medical history of T2DM, Dyslipidemia and GERD . The patient presented for initial endocrinology clinic visit on 10/19/2021 for consultative assistance with his diabetes management.    HPI: Jack Wade was    Diagnosed with DM years ago             Hemoglobin A1c has ranged from 7.1% in 2019, peaking at 7.9% in 2022.   In terms of diet, the patient eats 3 meals, snacks in the evening . The pt drinks sugar sweetened beverages    The patient declines any glycemic agents that would cause weight loss I have attempted to prescribe Januvia  in May, 2025 but this was cost prohibitive and the patient opted to see our CDE  I referral was placed to our CDE, but they attempted to contact him in May and left him a message but he decided against    SUBJECTIVE:   During the last visit (05/17/2024): A1c 8.3%     Today (09/17/24): Jack Wade  is here for a follow up on diabetes management . He  checks his blood sugars occasionally 0x daily. The patient has not had hypoglycemic episodes since the last clinic visit.   Follows with Cardiology for CAD He is on patient assistance program for Farxiga   Conitnues with chronic constipation but no diarrhea  Denies  nausea or vomiting     HOME DIABETES REGIMEN: Glipizide  10 mg , 2 tablets before breakfast and 2 tablet before supper Metformin  500 mg 1 tabs BID  Farxiga  5 mg daily - through ot assistance     Statin: yes ACE-I/ARB: no    METER DOWNLOAD SUMMARY: n/a      DIABETIC COMPLICATIONS: Microvascular complications:  CKD III, ? retinopathy ( Right eye - stable ) , neuropathy  Denies: neuropathy  Last eye exam:  Completed 10/25/2023  Macrovascular complications:  CAD , PVD Denies: CVA   PAST HISTORY: Past Medical History:  Past Medical History:  Diagnosis Date   Aortic calcification 02/09/2015   Appendicitis    Back pain 02/09/2015   CAD (coronary artery disease)    2 stents   CAD, multiple vessel 05/17/2016   CABG x6 vessel   Cataract    bilateral   Chicken pox    Diabetes mellitus type 2 in obese 04/11/2016   Diabetes type 2, controlled (HCC)    Dysphagia, unspecified(787.20) 03/20/2014   Hereditary and idiopathic peripheral neuropathy 02/09/2015   Hyperlipidemia    Hypertension    Hypothyroidism    Light headedness 07/04/2017   Measles    Medicare annual wellness visit, subsequent 06/14/2015   Melanoma (HCC)    Scalp. 2012   Mumps as a child   Thyroid  disease    [place] Hyperthyroidism   Past Surgical History:  Past Surgical History:  Procedure Laterality Date   APPENDECTOMY     BACK SURGERY     x2 lumbar   BACK SURGERY     late 1990s had 2 surgeries, first surgery lifted a heavy engine ruptured disds, at L4 and L5, cleaned discs no hardware very helpful. 2 years later while recovering from angiogram with weights applied to left leg  caused a recurrence and required surgery again in same area with disc repair   BIOPSY  12/13/2022   Procedure: BIOPSY;  Surgeon: Charlanne Groom, MD;  Location: [facility];  Service: Gastroenterology;;   CARDIAC CATHETERIZATION     CARDIAC CATHETERIZATION N/A 05/11/2016   Procedure: Left Heart Cath and Coronary Angiography;  Surgeon: Victory LELON Sharps, MD;  Location: [facility] INVASIVE CV LAB;  Service: Cardiovascular;  Laterality: N/A;   CARDIAC CATHETERIZATION N/A 05/11/2016   Procedure: Intravascular Pressure Wire/FFR Study;  Surgeon: Victory LELON Sharps, MD;  Location: [facility] INVASIVE CV LAB;  Service: Cardiovascular;  Laterality: N/A;   COLONOSCOPY WITH PROPOFOL  N/A 12/13/2022   Procedure: COLONOSCOPY WITH PROPOFOL ;  Surgeon: Charlanne Groom, MD;  Location: [facility]  ENDOSCOPY;  Service: Gastroenterology;  Laterality: N/A;   CORONARY ANGIOPLASTY WITH STENT PLACEMENT     2 stents   CORONARY ARTERY BYPASS GRAFT N/A 05/17/2016   Procedure: CORONARY ARTERY BYPASS GRAFTING (CABG) x 6 using left mammory artery and right greater saphenous vein harvested endoscopically. [place] to LAD, SVG to Diagonal, SVG Sequential to OM2 and ramus intermediate, SVG Sequential to RCA and PDA;  Surgeon: Dallas KATHEE Jude, MD;  Location: [facility] OR;  Service: Open Heart Surgery;  Laterality: N/A;   ESOPHAGOGASTRODUODENOSCOPY (EGD) WITH PROPOFOL  N/A 12/13/2022   Procedure: ESOPHAGOGASTRODUODENOSCOPY (EGD) WITH PROPOFOL ;  Surgeon: Charlanne Groom, MD;  Location: [facility];  Service: Gastroenterology;  Laterality: N/A;   KNEE ARTHROSCOPY     right knee   LUMBAR LAMINECTOMY/DECOMPRESSION MICRODISCECTOMY Bilateral 04/10/2015   Procedure: LUMBAR LAMINECTOMY/DECOMPRESSION MICRODISCECTOMY 2 LEVELS;  Surgeon: Rockey Peru, MD;  Location: [facility];  Service: Neurosurgery;  Laterality: Bilateral;  Bilateral L45 L5S1 Laminectomy and Foraminotomy   MALONEY DILATION  12/13/2022   Procedure: MALONEY DILATION;  Surgeon: Charlanne Groom, MD;  Location: [facility];  Service: Gastroenterology;;   POLYPECTOMY  12/13/2022   Procedure: POLYPECTOMY;  Surgeon: Charlanne Groom, MD;  Location: [facility];  Service: Gastroenterology;;   ROTATOR CUFF REPAIR Right    SKIN CANCER EXCISION     melanoma on scalp   TEE WITHOUT CARDIOVERSION N/A 05/17/2016   Procedure: TRANSESOPHAGEAL ECHOCARDIOGRAM (TEE);  Surgeon: Dallas KATHEE Jude, MD;  Location: [facility] OR;  Service: Open Heart Surgery;  Laterality: N/A;   TONSILLECTOMY AND ADENOIDECTOMY     WISDOM TOOTH EXTRACTION      Social History:  reports that he has quit smoking. His smoking use included cigarettes. He started smoking about 40 years ago. He has a 81.5 pack-year smoking history. He has never used smokeless tobacco. He reports current alcohol use. He reports that he  does not use drugs. Family History:  Family History  Problem Relation Age of Onset   Heart disease Father    Emphysema Father    Hypertension Father    Cancer Father        lung/ healthy   Heart disease Mother        Deceased   Cancer Mother        breast   Hypertension Mother    Cancer Maternal Grandmother        colon   Diabetes Brother        type 2   Heart disease Brother    Pulmonary embolism Son        vasculiti   Depression Paternal Grandmother    Depression Paternal Aunt      HOME MEDICATIONS: Allergies as of 09/17/2024   No Known Allergies      Medication List  Accurate as of September 17, 2024 12:52 PM. If you have any questions, ask your nurse or doctor.          aspirin  81 MG tablet Take 81 mg by mouth daily.   atorvastatin  80 MG tablet Commonly known as: LIPITOR TAKE 1 TABLET BY MOUTH EVERYDAY AT BEDTIME   Farxiga  5 MG Tabs tablet Generic drug: dapagliflozin  propanediol Take 1 tablet (5 mg total) by mouth daily before breakfast.   glipiZIDE  10 MG tablet Commonly known as: GLUCOTROL  Take 2 tablets (20 mg total) by mouth 2 (two) times daily before a meal.   levothyroxine  112 MCG tablet Commonly known as: SYNTHROID  TAKE 1 TABLET BY MOUTH EVERY DAY   metFORMIN  500 MG 24 hr tablet Commonly known as: GLUCOPHAGE -XR Take 1 tablet (500 mg total) by mouth in the morning and at bedtime.   OneTouch Delica Lancets 30G Misc 1 Device by Does not apply route daily in the afternoon.   OneTouch Verio Flex System w/Device Kit USE TO CHECK BLOOD SUGAR ONCE A DAY AND AS NEEDED. DX CODE: E11.8   OneTouch Verio test strip Generic drug: glucose blood USE TO CHECK BLOOD SUGAR ONCE A DAY AND AS NEEDED. DX CODE: E11.8   pantoprazole  40 MG tablet Commonly known as: PROTONIX  Take 1 tablet (40 mg total) by mouth daily. Take 30 minutes before breakfast.         ALLERGIES: No Known Allergies      OBJECTIVE:   VITAL SIGNS: BP 136/70 (BP  Location: Left Arm, Patient Position: Sitting, Cuff Size: Normal)   Pulse 84   Ht 6' (1.829 m)   SpO2 97%   BMI 25.65 kg/m    PHYSICAL EXAM:  General: Pt appears well and is in NAD  Lungs: Clear with good BS bilat   Heart: RRR   Extremities:  Lower extremities - No pretibial edema  Neuro: MS is good with appropriate affect, pt is alert and Ox3     DM Foot Exam 09/17/2024   The skin of the feet shows left great toe bandage but no other ulcerations The pedal pulses are undetectable  The sensation is mostly absent to a screening 5.07, 10 gram monofilament bilaterally   DATA REVIEWED:  Lab Results  Component Value Date   HGBA1C 8.2 (A) 09/17/2024   HGBA1C 8.3 (A) 05/17/2024   HGBA1C 7.9 (A) 01/10/2024     Latest Reference Range & Units 01/18/24 09:08  Sodium 135 - 145 mEq/L 139  Potassium 3.5 - 5.1 mEq/L 4.1  Chloride 96 - 112 mEq/L 105  CO2 19 - 32 mEq/L 26  Glucose 70 - 99 mg/dL 781 (H)  BUN 6 - 23 mg/dL 22  Creatinine 9.59 - 8.49 mg/dL 8.47 (H)  Calcium  8.4 - 10.5 mg/dL 9.7  Alkaline Phosphatase 39 - 117 U/L 90  Albumin  3.5 - 5.2 g/dL 4.4  AST 0 - 37 U/L 20  ALT 0 - 53 U/L 16  Total Protein 6.0 - 8.3 g/dL 7.0  Total Bilirubin 0.2 - 1.2 mg/dL 0.6  GFR >39.99 mL/min 42.82 (L)      Latest Reference Range & Units 01/10/24 10:25  Microalb, Ur mg/dL <9.7  MICROALB/CREAT RATIO <30 mg/g creat NOTE  Creatinine, Urine 20 - 320 mg/dL 44      Latest Reference Range & Units 01/18/24 09:08  Total CHOL/HDL Ratio  2  Cholesterol 0 - 200 mg/dL 98  HDL Cholesterol >60.99 mg/dL 55.49  LDL (calc) 0 - 99 mg/dL 28  NonHDL  53.07  Triglycerides 0.0 - 149.0 mg/dL 874.9  VLDL 0.0 - 59.9 mg/dL 74.9    Latest Reference Range & Units 01/18/24 09:08  TSH 0.35 - 5.50 uIU/mL 0.47   In office BG 260 mg/dL       ASSESSMENT / PLAN / RECOMMENDATIONS:   1) Type 2 Diabetes Mellitus, Sub- Optimally controlled, With CKD III, Neuropathic, retinopathic and macrovascular complications  - Most recent A1c of 8.2 %. Goal A1c < 7.5%.    -A1c continues to be above goal -He had stopped checking glucose at home, as did  not see the value of it  -Limited glycemic agents due to CKD and he declines any medications that would cause weight loss such as GLP-1 agonist, hence we have not been able to increase the Farxiga  which she obtains through patient assistance program -Januvia  has been cost prohibitive -Was unable to see our CDE due to a high co-pay -Today we discussed starting the patient on basal insulin , given the limitations of cost, weight loss, and CKD, we discussed the risk of hypoglycemia, but my assistant contacted his pharmacy to see why he has not been provided with a prescription of glipizide , as he has been out for a week, and I have refilled his glipizide  in May for a year supply -Per pharmacist the patient picked up #90 glipizide  10 mg in January, 2025, and he picked up #90 of glipizide  5 mg in February, 2025 ??? - Patient states he still takes glipizide  except a week ago when he ran out.  I did question how does a 73-month supply of glipizide  last 8 months.  It appears that the patient has not been taking glipizide   correctly - I have printed the AVS again to the patient, we went over the instructions, if his BG remains elevated by next visit then we will reconsider starting basal insulin   MEDICATIONS: Continue Farxiga  5 mg 1 tablet  Take glipizide  10 mg, 2 tablet before breakfast and 2 tablet before Supper Continue metformin  500 mg 1 tablet twice a day  EDUCATION / INSTRUCTIONS: BG monitoring instructions: Patient is instructed to check his blood sugars 1 times a day, fasting. Call [facility] if: BG persistently < 70  I reviewed the Rule of 15 for the treatment of hypoglycemia in detail with the patient. Literature supplied.   2) Diabetic complications:  Eye: Does  have known diabetic retinopathy.  Neuro/ Feet: Does  have known diabetic peripheral  neuropathy. Renal: Patient does  have known baseline CKD. He is not on an ACEI/ARB at present.    Follow-up in 4 months   I spent 30 minutes preparing to see the patient by review of recent labs, imaging and procedures, obtaining and reviewing separately obtained history, communicating with the patient, ordering medications, tests or procedures, and documenting clinical information in the EHR including the differential Dx, treatment, and any further evaluation and other management     Signed electronically by: Stefano Redgie Butts, MD  [facility]  [facility] [address] [place] [place], [place] [address] Phone: [phone] FAX: [phone]   CC: Domenica Harlene LABOR, MD [address] [place] [address] Phone: [phone]  Fax: [phone]    Return to Endocrinology clinic as below: Future Appointments  Date Time Provider Department Center  09/17/2024  2:00 PM [facility]  1 [facility]  09/23/2024  1:20 PM Domenica Harlene LABOR, MD [facility] [address]  11/05/2024 10:00 AM Charlanne Groom, MD [facility]  [facility]  01/17/2025 10:10 AM Gearold Wainer, Donell Cardinal, MD [facility] None

## 2024-09-22 NOTE — Assessment & Plan Note (Signed)
 On Levothyroxine, continue to monitor

## 2024-09-22 NOTE — Assessment & Plan Note (Signed)
Patient encouraged to maintain heart healthy diet, regular exercise, adequate sleep. Consider daily probiotics. Take medications as prescribed. Labs reviewed 

## 2024-09-22 NOTE — Assessment & Plan Note (Signed)
 Hydrate and monitor

## 2024-09-22 NOTE — Assessment & Plan Note (Signed)
 Tolerating statin, encouraged heart healthy diet, avoid trans fats, minimize simple carbs and saturated fats. Increase exercise as tolerated

## 2024-09-22 NOTE — Assessment & Plan Note (Signed)
 hgba1c mildly elevated continues to follow with endocrinology, minimize simple carbs. Increase exercise as tolerated. Continue current meds

## 2024-09-22 NOTE — Assessment & Plan Note (Signed)
Avoid offending foods, start probiotics. Do not eat large meals in late evening and consider raising head of bed.  

## 2024-09-22 NOTE — Assessment & Plan Note (Signed)
 Well controlled, no changes to meds. Encouraged heart healthy diet such as the DASH diet and exercise as tolerated.

## 2024-09-23 ENCOUNTER — Ambulatory Visit: Payer: Medicare Other | Admitting: Family Medicine

## 2024-09-23 ENCOUNTER — Encounter: Payer: Self-pay | Admitting: Family Medicine

## 2024-09-23 ENCOUNTER — Ambulatory Visit: Payer: Self-pay | Admitting: Physician Assistant

## 2024-09-23 VITALS — BP 118/78 | HR 62 | Temp 97.6°F | Resp 16 | Ht 72.0 in

## 2024-09-23 DIAGNOSIS — K219 Gastro-esophageal reflux disease without esophagitis: Secondary | ICD-10-CM

## 2024-09-23 DIAGNOSIS — E7800 Pure hypercholesterolemia, unspecified: Secondary | ICD-10-CM

## 2024-09-23 DIAGNOSIS — K449 Diaphragmatic hernia without obstruction or gangrene: Secondary | ICD-10-CM

## 2024-09-23 DIAGNOSIS — I10 Essential (primary) hypertension: Secondary | ICD-10-CM

## 2024-09-23 DIAGNOSIS — Z0000 Encounter for general adult medical examination without abnormal findings: Secondary | ICD-10-CM

## 2024-09-23 DIAGNOSIS — N183 Chronic kidney disease, stage 3 unspecified: Secondary | ICD-10-CM | POA: Diagnosis not present

## 2024-09-23 DIAGNOSIS — E038 Other specified hypothyroidism: Secondary | ICD-10-CM | POA: Diagnosis not present

## 2024-09-23 DIAGNOSIS — E1142 Type 2 diabetes mellitus with diabetic polyneuropathy: Secondary | ICD-10-CM | POA: Diagnosis not present

## 2024-09-23 NOTE — Patient Instructions (Addendum)
 Annual covid vaccine RSV, Respiratory Syncitial Virus Vaccine, Arexvy Tetanus was August 2020  Preventive Care 65 Years and Older, Male Preventive care refers to lifestyle choices and visits with your health care provider that can promote health and wellness. Preventive care visits are also called wellness exams. What can I expect for my preventive care visit? Counseling During your preventive care visit, your health care provider may ask about your: Medical history, including: Past medical problems. Family medical history. History of falls. Current health, including: Emotional well-being. Home life and relationship well-being. Sexual activity. Memory and ability to understand (cognition). Lifestyle, including: Alcohol, nicotine or tobacco, and drug use. Access to firearms. Diet, exercise, and sleep habits. Work and work Astronomer. Sunscreen use. Safety issues such as seatbelt and bike helmet use. Physical exam Your health care provider will check your: Height and weight. These may be used to calculate your BMI (body mass index). BMI is a measurement that tells if you are at a healthy weight. Waist circumference. This measures the distance around your waistline. This measurement also tells if you are at a healthy weight and may help predict your risk of certain diseases, such as type 2 diabetes and high blood pressure. Heart rate and blood pressure. Body temperature. Skin for abnormal spots. What immunizations do I need?  Vaccines are usually given at various ages, according to a schedule. Your health care provider will recommend vaccines for you based on your age, medical history, and lifestyle or other factors, such as travel or where you work. What tests do I need? Screening Your health care provider may recommend screening tests for certain conditions. This may include: Lipid and cholesterol levels. Diabetes screening. This is done by checking your blood sugar (glucose)  after you have not eaten for a while (fasting). Hepatitis C test. Hepatitis B test. HIV (human immunodeficiency virus) test. STI (sexually transmitted infection) testing, if you are at risk. Lung cancer screening. Colorectal cancer screening. Prostate cancer screening. Abdominal aortic aneurysm (AAA) screening. You may need this if you are a current or former smoker. Talk with your health care provider about your test results, treatment options, and if necessary, the need for more tests. Follow these instructions at home: Eating and drinking  Eat a diet that includes fresh fruits and vegetables, whole grains, lean protein, and low-fat dairy products. Limit your intake of foods with high amounts of sugar, saturated fats, and salt. Take vitamin and mineral supplements as recommended by your health care provider. Do not drink alcohol if your health care provider tells you not to drink. If you drink alcohol: Limit how much you have to 0-2 drinks a day. Know how much alcohol is in your drink. In the [place] one drink equals one 12 oz bottle of beer (355 mL), one 5 oz glass of wine (148 mL), or one 1 oz glass of hard liquor (44 mL). Lifestyle Brush your teeth every morning and night with fluoride toothpaste. Floss one time each day. Exercise for at least 30 minutes 5 or more days each week. Do not use any products that contain nicotine or tobacco. These products include cigarettes, chewing tobacco, and vaping devices, such as e-cigarettes. If you need help quitting, ask your health care provider. Do not use drugs. If you are sexually active, practice safe sex. Use a condom or other form of protection to prevent STIs. Take aspirin  only as told by your health care provider. Make sure that you understand how much to take and what form to  take. Work with your health care provider to find out whether it is safe and beneficial for you to take aspirin  daily. Ask your health care provider if you need to  take a cholesterol-lowering medicine (statin). Find healthy ways to manage stress, such as: Meditation, yoga, or listening to music. Journaling. Talking to a trusted person. Spending time with friends and family. Safety Always wear your seat belt while driving or riding in a vehicle. Do not drive: If you have been drinking alcohol. Do not ride with someone who has been drinking. When you are tired or distracted. While texting. If you have been using any mind-altering substances or drugs. Wear a helmet and other protective equipment during sports activities. If you have firearms in your house, make sure you follow all gun safety procedures. Minimize exposure to UV radiation to reduce your risk of skin cancer. What's next? Visit your health care provider once a year for an annual wellness visit. Ask your health care provider how often you should have your eyes and teeth checked. Stay up to date on all vaccines. This information is not intended to replace advice given to you by your health care provider. Make sure you discuss any questions you have with your health care provider. Document Revised: 06/09/2021 Document Reviewed: 06/09/2021 Elsevier Patient Education  2024 [employer].

## 2024-09-24 NOTE — Progress Notes (Signed)
 Subjective:    Patient ID: Jack Wade, male    DOB: 13-Feb-1941, 83 y.o.   MRN: [id number]  Chief Complaint  Patient presents with   Annual Exam    Patient presents today for a physical exam    HPI Discussed the use of AI scribe software for clinical note transcription with the patient, who gave verbal consent to proceed.  History of Present Illness Jack Wade is an 83 year old male who presents for a routine annual preventative visit and declines management of his chronic medical conditions after a lengthy discussion. He reports he is largely feeling well and stays active.   He has a history of acid reflux and is currently taking pantoprazole . He experienced dizziness when swallowing water after running out of pantoprazole , which resolved upon resuming the medication. He has an upcoming appointment for further evaluation.  He has a history of skin issues and is following up with a dermatologist. He recently received a cream for facial lesions after previous treatments with freezing. He has had a melanoma removed from the top of his head in the past and continues to monitor for skin changes.  He is a former smoker, having quit at age 43, and reports no current respiratory symptoms.  He has received various vaccinations, including a recent flu shot, and is up to date with his tetanus and pneumonia vaccinations. He maintains a stable weight around 190 pounds.    Past Medical History:  Diagnosis Date   Aortic calcification 02/09/2015   Appendicitis    Back pain 02/09/2015   CAD (coronary artery disease)    2 stents   CAD, multiple vessel 05/17/2016   CABG x6 vessel   Cataract    bilateral   Chicken pox    Diabetes mellitus type 2 in obese 04/11/2016   Diabetes type 2, controlled (HCC)    Dysphagia, unspecified(787.20) 03/20/2014   Hereditary and idiopathic peripheral neuropathy 02/09/2015   Hyperlipidemia    Hypertension    Hypothyroidism    Light  headedness 07/04/2017   Measles    Medicare annual wellness visit, subsequent 06/14/2015   Melanoma (HCC)    Scalp. 2012   Mumps as a child   Thyroid  disease    [place] Hyperthyroidism    Past Surgical History:  Procedure Laterality Date   APPENDECTOMY     BACK SURGERY     x2 lumbar   BACK SURGERY     late 1990s had 2 surgeries, first surgery lifted a heavy engine ruptured disds, at L4 and L5, cleaned discs no hardware very helpful. 2 years later while recovering from angiogram with weights applied to left leg caused a recurrence and required surgery again in same area with disc repair   BIOPSY  12/13/2022   Procedure: BIOPSY;  Surgeon: Charlanne Groom, MD;  Location: [facility];  Service: Gastroenterology;;   CARDIAC CATHETERIZATION     CARDIAC CATHETERIZATION N/A 05/11/2016   Procedure: Left Heart Cath and Coronary Angiography;  Surgeon: Victory LELON Sharps, MD;  Location[facility] INVASIVE CV LAB;  Service: Cardiovascular;  Laterality: N/A;   CARDIAC CATHETERIZATION N/A 05/11/2016   Procedure: Intravascular Pressure Wire/FFR Study;  Surgeon: Victory LELON Sharps, MD;  Location: [facility] INVASIVE CV LAB;  Service: Cardiovascular;  Laterality: N/A;   COLONOSCOPY WITH PROPOFOL  N/A 12/13/2022   Procedure: COLONOSCOPY WITH PROPOFOL ;  Surgeon: Charlanne Groom, MD;  Location: [facility];  Service: Gastroenterology;  Laterality: N/A;   CORONARY ANGIOPLASTY WITH STENT PLACEMENT  2 stents   CORONARY ARTERY BYPASS GRAFT N/A 05/17/2016   Procedure: CORONARY ARTERY BYPASS GRAFTING (CABG) x 6 using left mammory artery and right greater saphenous vein harvested endoscopically. [place] to LAD, SVG to Diagonal, SVG Sequential to OM2 and ramus intermediate, SVG Sequential to RCA and PDA;  Surgeon: Dallas KATHEE Jude, MD;  Location: [facility] OR;  Service: Open Heart Surgery;  Laterality: N/A;   ESOPHAGOGASTRODUODENOSCOPY (EGD) WITH PROPOFOL  N/A 12/13/2022   Procedure: ESOPHAGOGASTRODUODENOSCOPY (EGD) WITH PROPOFOL ;  Surgeon: Charlanne Groom, MD;  Location: [facility];  Service: Gastroenterology;  Laterality: N/A;   KNEE ARTHROSCOPY     right knee   LUMBAR LAMINECTOMY/DECOMPRESSION MICRODISCECTOMY Bilateral 04/10/2015   Procedure: LUMBAR LAMINECTOMY/DECOMPRESSION MICRODISCECTOMY 2 LEVELS;  Surgeon: Rockey Peru, MD;  Location: [facility];  Service: Neurosurgery;  Laterality: Bilateral;  Bilateral L45 L5S1 Laminectomy and Foraminotomy   MALONEY DILATION  12/13/2022   Procedure: MALONEY DILATION;  Surgeon: Charlanne Groom, MD;  Location: [facility];  Service: Gastroenterology;;   POLYPECTOMY  12/13/2022   Procedure: POLYPECTOMY;  Surgeon: Charlanne Groom, MD;  Location: [facility];  Service: Gastroenterology;;   ROTATOR CUFF REPAIR Right    SKIN CANCER EXCISION     melanoma on scalp   TEE WITHOUT CARDIOVERSION N/A 05/17/2016   Procedure: TRANSESOPHAGEAL ECHOCARDIOGRAM (TEE);  Surgeon: Dallas KATHEE Jude, MD;  Location: [facility] OR;  Service: Open Heart Surgery;  Laterality: N/A;   TONSILLECTOMY AND ADENOIDECTOMY     WISDOM TOOTH EXTRACTION      Family History  Problem Relation Age of Onset   Heart disease Mother        Deceased   Cancer Mother        breast   Hypertension Mother    Heart disease Father    Emphysema Father    Hypertension Father    Cancer Father        lung/ healthy   Diabetes Brother        type 2   Heart disease Brother    Pulmonary embolism Son        vasculiti   Depression Paternal Aunt    Cancer Maternal Grandmother        colon   Depression Paternal Grandmother     Social History   Socioeconomic History   Marital status: Married    Spouse name: Not on file   Number of children: 2   Years of education: Not on file   Highest education level: Some college, no degree  Occupational History   Occupation: retired  Tobacco Use   Smoking status: Former    Current packs/day: 2.00    Average packs/day: 2.0 packs/day for 40.7 years (81.5 ttl pk-yrs)    Types: Cigarettes    Start date:  12/27/1983   Smokeless tobacco: Never  Vaping Use   Vaping status: Never Used  Substance and Sexual Activity   Alcohol use: Yes    Alcohol/week: 0.0 standard drinks of alcohol    Comment: occasionally   Drug use: No   Sexual activity: Never    Comment: lives with wife. retired, no dietary restrictions, Public relations account executive.   Other Topics Concern   Not on file  Social History Narrative   Not on file   Social Drivers of Health   Financial Resource Strain: Medium Risk (09/22/2024)   Overall Financial Resource Strain (CARDIA)    Difficulty of Paying Living Expenses: Somewhat hard  Food Insecurity: No Food Insecurity (09/22/2024)   Hunger Vital Sign    Worried About  Running Out of Food in the Last Year: Never true    Ran Out of Food in the Last Year: Never true  Transportation Needs: No Transportation Needs (09/22/2024)   PRAPARE - Administrator, Civil Service (Medical): No    Lack of Transportation (Non-Medical): No  Physical Activity: Sufficiently Active (09/22/2024)   Exercise Vital Sign    Days of Exercise per Week: 2 days    Minutes of Exercise per Session: 120 min  Stress: No Stress Concern Present (09/22/2024)   [employer] of Occupational Health - Occupational Stress Questionnaire    Feeling of Stress: Not at all  Social Connections: Moderately Integrated (09/22/2024)   Social Connection and Isolation Panel    Frequency of Communication with Friends and Family: Three times a week    Frequency of Social Gatherings with Friends and Family: Once a week    Attends Religious Services: More than 4 times per year    Active Member of [employer] or Organizations: No    Attends Engineer, structural: Not on file    Marital Status: Married  Catering manager Violence: Not on file    Outpatient Medications Prior to Visit  Medication Sig Dispense Refill   aspirin  81 MG tablet Take 81 mg by mouth daily.     atorvastatin  (LIPITOR) 80 MG tablet TAKE 1 TABLET BY MOUTH EVERYDAY  AT BEDTIME 90 tablet 3   Blood Glucose Monitoring Suppl (ONETOUCH VERIO FLEX SYSTEM) w/Device KIT USE TO CHECK BLOOD SUGAR ONCE A DAY AND AS NEEDED. DX CODE: E11.8 1 kit 0   FARXIGA  5 MG TABS tablet Take 1 tablet (5 mg total) by mouth daily before breakfast. 90 tablet 3   glipiZIDE  (GLUCOTROL ) 10 MG tablet Take 2 tablets (20 mg total) by mouth 2 (two) times daily before a meal. 360 tablet 3   levothyroxine  (SYNTHROID ) 112 MCG tablet TAKE 1 TABLET BY MOUTH EVERY DAY 90 tablet 1   metFORMIN  (GLUCOPHAGE -XR) 500 MG 24 hr tablet Take 1 tablet (500 mg total) by mouth in the morning and at bedtime. 180 tablet 3   OneTouch Delica Lancets 30G MISC 1 Device by Does not apply route daily in the afternoon. 100 each 3   ONETOUCH VERIO test strip USE TO CHECK BLOOD SUGAR ONCE A DAY AND AS NEEDED. DX CODE: E11.8 100 strip 1   pantoprazole  (PROTONIX ) 40 MG tablet Take 1 tablet (40 mg total) by mouth daily. Take 30 minutes before breakfast. 90 tablet 3   Facility-Administered Medications Prior to Visit  Medication Dose Route Frequency Provider Last Rate Last Admin   0.9 %  sodium chloride  infusion  500 mL Intravenous Once Charlanne Groom, MD        No Known Allergies  Review of Systems  Constitutional:  Negative for chills, fever and malaise/fatigue.  HENT:  Negative for congestion and hearing loss.   Eyes:  Negative for discharge.  Respiratory:  Negative for cough, sputum production and shortness of breath.   Cardiovascular:  Negative for chest pain, palpitations and leg swelling.  Gastrointestinal:  Negative for blood in stool, constipation, diarrhea and vomiting.  Genitourinary:  Negative for frequency and urgency.  Musculoskeletal:  Negative for back pain, falls and myalgias.  Skin:  Negative for rash.  Neurological:  Negative for dizziness, loss of consciousness, weakness and headaches.  Endo/Heme/Allergies:  Negative for environmental allergies.  Psychiatric/Behavioral:  Negative for depression. The  patient is not nervous/anxious and does not have insomnia.  Objective:    Physical Exam Vitals reviewed.  Constitutional:      General: He is not in acute distress.    Appearance: Normal appearance. He is not ill-appearing or diaphoretic.  HENT:     Head: Normocephalic and atraumatic.     Right Ear: Tympanic membrane, ear canal and external ear normal. There is no impacted cerumen.     Left Ear: Tympanic membrane, ear canal and external ear normal. There is no impacted cerumen.     Nose: Nose normal. No rhinorrhea.     Mouth/Throat:     Pharynx: Oropharynx is clear.  Eyes:     General: No scleral icterus.    Extraocular Movements: Extraocular movements intact.     Conjunctiva/sclera: Conjunctivae normal.     Pupils: Pupils are equal, round, and reactive to light.  Neck:     Thyroid : No thyroid  mass or thyroid  tenderness.  Cardiovascular:     Rate and Rhythm: Normal rate and regular rhythm.     Pulses: Normal pulses.     Heart sounds: Normal heart sounds. No murmur heard. Pulmonary:     Effort: Pulmonary effort is normal.     Breath sounds: Normal breath sounds. No wheezing.  Abdominal:     General: Bowel sounds are normal.     Palpations: Abdomen is soft. There is no mass.     Tenderness: There is no abdominal tenderness. There is no guarding.  Musculoskeletal:        General: No swelling. Normal range of motion.     Cervical back: Normal range of motion and neck supple. No rigidity.     Right lower leg: No edema.     Left lower leg: No edema.  Lymphadenopathy:     Cervical: No cervical adenopathy.  Skin:    General: Skin is warm and dry.     Findings: No rash.  Neurological:     General: No focal deficit present.     Mental Status: He is alert and oriented to person, place, and time.     Cranial Nerves: No cranial nerve deficit.     Deep Tendon Reflexes: Reflexes normal.  Psychiatric:        Mood and Affect: Mood normal.        Behavior: Behavior normal.      BP 118/78   Pulse 62   Temp 97.6 F (36.4 C)   Resp 16   Ht 6' (1.829 m)   SpO2 96%   BMI 25.65 kg/m  Wt Readings from Last 3 Encounters:  09/10/24 189 lb 2 oz (85.8 kg)  09/04/24 187 lb (84.8 kg)  05/17/24 193 lb (87.5 kg)    Diabetic Foot Exam - Simple   No data filed    Lab Results  Component Value Date   WBC 8.1 01/18/2024   HGB 14.3 01/18/2024   HCT 44.9 01/18/2024   PLT 243.0 01/18/2024   GLUCOSE 218 (H) 01/18/2024   CHOL 98 01/18/2024   TRIG 125.0 01/18/2024   HDL 44.50 01/18/2024   LDLDIRECT 43.0 04/11/2016   LDLCALC 28 01/18/2024   ALT 16 01/18/2024   AST 20 01/18/2024   NA 139 01/18/2024   K 4.1 01/18/2024   CL 105 01/18/2024   CREATININE 1.52 (H) 01/18/2024   BUN 22 01/18/2024   CO2 26 01/18/2024   TSH 0.47 01/18/2024   PSA 0.91 09/24/2020   INR 1.61 (H) 05/17/2016   HGBA1C 8.2 (A) 09/17/2024   MICROALBUR <0.2 01/10/2024  Lab Results  Component Value Date   TSH 0.47 01/18/2024   Lab Results  Component Value Date   WBC 8.1 01/18/2024   HGB 14.3 01/18/2024   HCT 44.9 01/18/2024   MCV 83.6 01/18/2024   PLT 243.0 01/18/2024   Lab Results  Component Value Date   NA 139 01/18/2024   K 4.1 01/18/2024   CO2 26 01/18/2024   GLUCOSE 218 (H) 01/18/2024   BUN 22 01/18/2024   CREATININE 1.52 (H) 01/18/2024   BILITOT 0.6 01/18/2024   ALKPHOS 90 01/18/2024   AST 20 01/18/2024   ALT 16 01/18/2024   PROT 7.0 01/18/2024   ALBUMIN  4.4 01/18/2024   CALCIUM  9.7 01/18/2024   ANIONGAP 10 03/15/2022   GFR 42.82 (L) 01/18/2024   Lab Results  Component Value Date   CHOL 98 01/18/2024   Lab Results  Component Value Date   HDL 44.50 01/18/2024   Lab Results  Component Value Date   LDLCALC 28 01/18/2024   Lab Results  Component Value Date   TRIG 125.0 01/18/2024   Lab Results  Component Value Date   CHOLHDL 2 01/18/2024   Lab Results  Component Value Date   HGBA1C 8.2 (A) 09/17/2024       Assessment & Plan:  Stage 3  chronic kidney disease, unspecified whether stage 3a or 3b CKD (HCC) Assessment & Plan: Hydrate and monitor   Hiatal hernia with GERD Assessment & Plan: Avoid offending foods, start probiotics. Do not eat large meals in late evening and consider raising head of bed.     Primary hypertension Assessment & Plan: Well controlled, no changes to meds. Encouraged heart healthy diet such as the DASH diet and exercise as tolerated.      Pure hypercholesterolemia Assessment & Plan: Tolerating statin, encouraged heart healthy diet, avoid trans fats, minimize simple carbs and saturated fats. Increase exercise as tolerated   Other specified hypothyroidism Assessment & Plan: On Levothyroxine , continue to monitor   Type 2 diabetes mellitus with diabetic polyneuropathy, without long-term current use of insulin  [facility]) Assessment & Plan: hgba1c mildly elevated continues to follow with endocrinology, minimize simple carbs. Increase exercise as tolerated. Continue current meds    Preventative health care Assessment & Plan: Patient encouraged to maintain heart healthy diet, regular exercise, adequate sleep. Consider daily probiotics. Take medications as prescribed. Labs reviewed.      Assessment and Plan Assessment & Plan Adult Wellness Visit Routine adult wellness visit for an 83 year old male focusing on general health maintenance, vaccinations, and lifestyle modifications. - Encourage hydration: 5-10 ounces of water every 1-2 hours throughout the day. - Encourage protein intake every 3-4 hours. - Recommend a high-quality multivitamin and a fatty acid supplement like fish oil. - Discuss the importance of sleep (6-8 hours per night) and social engagement. - Advise on the use of wide-brimmed hats for sun protection, especially for the ears.  General Health Maintenance Discussion on vaccinations and preventive care, emphasizing the importance of staying up to date to prevent serious  illnesses, especially pneumonia. - Advise obtaining a tetanus booster at the pharmacy, considering frequent exposure to potential injuries. - Recommend a one-time boost with Prevnar 20 for additional pneumococcal protection, as it covers more strains than previous vaccines. - Recommend RSV vaccine (Arexvy) for respiratory protection, noting an 85% reduction in hospitalization risk if contracted. - Confirm recent flu vaccination with [facility] and ensure it is documented in the medical record. - Recommend annual COVID vaccination.  Follow-up Emphasized the importance of regular  check-ups to monitor health status and adjust care as needed. - Schedule a six-month follow-up visit. - Schedule a one-year comprehensive physical exam (CPE).  Recording duration: 28 minutes     Harlene Horton, MD

## 2024-10-28 ENCOUNTER — Ambulatory Visit: Payer: Self-pay

## 2024-10-28 ENCOUNTER — Encounter: Payer: Self-pay | Admitting: Family Medicine

## 2024-10-28 ENCOUNTER — Ambulatory Visit (INDEPENDENT_AMBULATORY_CARE_PROVIDER_SITE_OTHER): Admitting: Family Medicine

## 2024-10-28 ENCOUNTER — Ambulatory Visit (HOSPITAL_BASED_OUTPATIENT_CLINIC_OR_DEPARTMENT_OTHER)
Admission: RE | Admit: 2024-10-28 | Discharge: 2024-10-28 | Disposition: A | Discharge status: Home / Self Care | Source: Ambulatory Visit | Attending: Family Medicine | Admitting: Family Medicine

## 2024-10-28 VITALS — BP 122/60 | HR 80 | Resp 18 | Ht 72.0 in | Wt 188.6 lb

## 2024-10-28 DIAGNOSIS — M549 Dorsalgia, unspecified: Secondary | ICD-10-CM

## 2024-10-28 DIAGNOSIS — M546 Pain in thoracic spine: Secondary | ICD-10-CM | POA: Diagnosis not present

## 2024-10-28 DIAGNOSIS — M47814 Spondylosis without myelopathy or radiculopathy, thoracic region: Secondary | ICD-10-CM | POA: Diagnosis not present

## 2024-10-28 NOTE — Progress Notes (Signed)
 Acute Office Visit  Subjective:     Patient ID: Jack Wade, male    DOB: 01/24/1941, 83 y.o.   MRN: [id number]  Chief Complaint  Patient presents with   Back Pain    Onset: 3 weeks - mid      Patient is in today for mid back pain.    Discussed the use of AI scribe software for clinical note transcription with the patient, who gave verbal consent to proceed.  History of Present Illness Jack Wade is an 83 year old male who presents with thoracic back pain.  He has been experiencing thoracic back pain for the past three weeks without any specific injury at the onset. The pain is described as a 'seven out of ten' and feels like 'somebody's punching'. It radiates around his rib cage and worsens with certain movements, particularly twisting to the left or right, and bending backward. Bending forward does not cause pain. He finds some relief when sitting and relaxing slightly back.  He has a history of lower back issues and prior lumbar surgery but states that this pain is different and located higher up. He has not tried any medications, ice, or heat for relief due to concerns about efficacy and his kidney function. He has previously undergone physical therapy for lower back issues but found it unhelpful.  No urinary issues, numbness, tingling, or pain radiating up or down the back. The pain is constant but can worsen with movement. He has not engaged in any unusual lifting or activities that could have triggered the pain, although he works in his garage. He has not had any prior diagnostic studies for the middle of his back, only for the lower back many years ago.         ROS: All review of systems negative except what is listed in the HPI      Objective:    BP 122/60   Pulse 80   Resp 18   Ht 6' (1.829 m)   Wt 188 lb 9.6 oz (85.5 kg)   SpO2 98%   BMI 25.58 kg/m    Physical Exam Vitals reviewed.  Constitutional:      Appearance: Normal  appearance.  Musculoskeletal:     Cervical back: No tenderness.     Thoracic back: No edema or bony tenderness. Decreased range of motion.     Lumbar back: No tenderness or bony tenderness.     Comments: Bilateral thoracic paraspinal muscles with tension on palpation; slightly decreased rotation bilaterally due to discomfort   Skin:    General: Skin is warm and dry.     Findings: No rash.  Neurological:     Mental Status: He is alert.  Psychiatric:        Mood and Affect: Mood normal.        Behavior: Behavior normal.        Thought Content: Thought content normal.        Judgment: Judgment normal.         No results found for any visits on 10/28/24.      Assessment & Plan:   Problem List Items Addressed This Visit   None Visit Diagnoses       Mid back pain    -  Primary   Relevant Orders   DG Thoracic Spine 2 View      Assessment & Plan Thoracic back pain Thoracic back pain for three weeks. - Order thoracic spine x-ray  -  Recommend heat application and stretching exercises for thoracic region. - Provide home exercise handouts for middle back stretching. - Suggest over-the-counter lidocaine  patches for pain relief if needed. - Advise against anti-inflammatory medications due to kidney function concerns. - Instruct to avoid heavy lifting until further evaluation. - Discuss to back specialist if needed after x-ray results. - Advise to monitor for new symptoms such as radiation of pain, numbness, tingling, or bowel/bladder issues and seek immediate care if he occurs. - Patient not currently interested in taking medication or doing physical therapy   Patient aware of signs/symptoms requiring further/urgent evaluation.    No orders of the defined types were placed in this encounter.   Return if symptoms worsen or fail to improve.  Waddell KATHEE Mon, NP

## 2024-10-28 NOTE — Telephone Encounter (Signed)
 Appt scheduled

## 2024-10-28 NOTE — Telephone Encounter (Signed)
 FYI Only or Action Required?: FYI only for provider: appointment scheduled on 11/3 with Waddell Mon NP.  Patient was last seen in primary care on 09/23/2024 by Domenica Harlene LABOR, MD.  Called Nurse Triage reporting Back Pain.  Symptoms began several weeks ago.  Interventions attempted: Rest, hydration, or home remedies.  Symptoms are: gradually worsening.  Triage Disposition: See PCP When Office is Open (Within 3 Days)  Patient/caregiver understands and will follow disposition?: Yes   Copied from CRM [id number]. Topic: Clinical - Red Word Triage >> Oct 28, 2024  9:05 AM Jack Wade wrote: Red Word that prompted transfer to Nurse Triage: pain radiating from back to chest Reason for Disposition  [1] MODERATE back pain (e.g., interferes with normal activities) AND [2] present > 3 days  Answer Assessment - Initial Assessment Questions Patient has history of bypass surgery and history of stents, has also had back surgery  1. ONSET: When did the pain begin? (e.g., minutes, hours, days)     3 weeks 2. LOCATION: Where does it hurt? (upper, mid or lower back)     Back pain in the middle and it goes around to the chest 3. SEVERITY: How bad is the pain?  (e.g., Scale 1-10; mild, moderate, or severe)     Patient's wife states patient is groaning with pain 4. PATTERN: Is the pain constant? (e.g., yes, no; constant, intermittent)      Constant  5. RADIATION: Does the pain shoot into your legs or somewhere else?     Chest 6. CAUSE:  What do you think is causing the back pain?      Unsure of cause - patient's wife states patient fell a while ago 10. OTHER SYMPTOMS: Do you have any other symptoms? (e.g., fever, abdomen pain, burning with urination, blood in urine)       No blood  Protocols used: Back Pain-A-AH

## 2024-10-30 ENCOUNTER — Encounter: Payer: Self-pay | Admitting: Family Medicine

## 2024-10-30 DIAGNOSIS — Z961 Presence of intraocular lens: Secondary | ICD-10-CM | POA: Diagnosis not present

## 2024-10-30 DIAGNOSIS — H524 Presbyopia: Secondary | ICD-10-CM | POA: Diagnosis not present

## 2024-10-30 DIAGNOSIS — E119 Type 2 diabetes mellitus without complications: Secondary | ICD-10-CM | POA: Diagnosis not present

## 2024-10-30 DIAGNOSIS — H04123 Dry eye syndrome of bilateral lacrimal glands: Secondary | ICD-10-CM | POA: Diagnosis not present

## 2024-10-30 DIAGNOSIS — H11043 Peripheral pterygium, stationary, bilateral: Secondary | ICD-10-CM | POA: Diagnosis not present

## 2024-10-30 DIAGNOSIS — H02055 Trichiasis without entropian left lower eyelid: Secondary | ICD-10-CM | POA: Diagnosis not present

## 2024-10-30 LAB — OPHTHALMOLOGY REPORT-SCANNED

## 2024-11-01 ENCOUNTER — Encounter: Payer: Self-pay | Admitting: Family Medicine

## 2024-11-01 NOTE — Telephone Encounter (Signed)
 Has not been read.

## 2024-11-04 ENCOUNTER — Ambulatory Visit: Payer: Self-pay | Admitting: Family Medicine

## 2024-11-05 ENCOUNTER — Encounter: Admitting: Gastroenterology

## 2025-01-15 ENCOUNTER — Other Ambulatory Visit: Payer: Self-pay | Admitting: Internal Medicine

## 2025-01-15 ENCOUNTER — Telehealth: Payer: Self-pay

## 2025-01-15 ENCOUNTER — Telehealth: Payer: Self-pay | Admitting: Internal Medicine

## 2025-01-15 MED ORDER — LEVOTHYROXINE SODIUM 112 MCG PO TABS: 112.0000 ug | ORAL_TABLET | Freq: Every day | ORAL | 1 refills | Status: AC

## 2025-01-15 NOTE — Telephone Encounter (Signed)
 Copied from CRM [id number]. Topic: Clinical - Medication Refill >> Jan 15, 2025 10:08 AM Joesph B wrote: Medication: levothyroxine  (SYNTHROID ) 112 MCG tablet   metFORMIN  (GLUCOPHAGE -XR) 500 MG 24 hr tablet    Has the patient contacted their pharmacy? Yes (Agent: If no, request that the patient contact the pharmacy for the refill. If patient does not wish to contact the pharmacy document the reason why and proceed with request.) (Agent: If yes, when and what did the pharmacy advise?)  This is the patient's preferred pharmacy:  [id number] - [place], [place] - [address] AT [address]OSS [address] [place] [address] Phone: [phone] Fax: [phone]  Is this the correct pharmacy for this prescription? Yes If no, delete pharmacy and type the correct one.   Has the prescription been filled recently? Yes  Is the patient out of the medication? Yes  Has the patient been seen for an appointment in the last year OR does the patient have an upcoming appointment? Yes  Can we respond through MyChart? Yes  Agent: Please be advised that Rx refills may take up to 3 business days. We ask that you follow-up with your pharmacy.

## 2025-01-15 NOTE — Telephone Encounter (Signed)
 Refill sent

## 2025-01-15 NOTE — Telephone Encounter (Signed)
 Error CRM sent. Requested meds are prescribed by Dr. Sam at endo.

## 2025-01-15 NOTE — Telephone Encounter (Signed)
 MEDICATION: levothyroxine  levothyroxine  (SYNTHROID ) 112 MCG tablet  PHARMACY:    [id number] - [place], [place] - [address] AT [address] (Ph: [phone])    HAS THE PATIENT CONTACTED THEIR PHARMACY?  Yes  LAST REFILL:  @@LASTREFILL @  IS THIS A 90 DAY SUPPLY : Yes  IS PATIENT OUT OF MEDICATION: No  IF NOT; HOW MUCH IS LEFT: Not sure  LAST APPOINTMENT DATE: @9 /23/2025  NEXT APPOINTMENT DATE:@1 /23/2026  DO WE HAVE YOUR PERMISSION TO LEAVE A DETAILED MESSAGE?: Yes  OTHER COMMENTS:    **Let patient know to contact pharmacy at the end of the day to make sure medication is ready. **  ** Please notify patient to allow 48-72 hours to process**  **Encourage patient to contact the pharmacy for refills or they can request refills through [facility]**

## 2025-01-17 ENCOUNTER — Other Ambulatory Visit

## 2025-01-17 ENCOUNTER — Ambulatory Visit: Admitting: Internal Medicine

## 2025-01-17 ENCOUNTER — Encounter: Payer: Self-pay | Admitting: Internal Medicine

## 2025-01-17 VITALS — BP 124/78 | Ht 72.0 in | Wt 189.0 lb

## 2025-01-17 DIAGNOSIS — E1142 Type 2 diabetes mellitus with diabetic polyneuropathy: Secondary | ICD-10-CM

## 2025-01-17 DIAGNOSIS — E1159 Type 2 diabetes mellitus with other circulatory complications: Secondary | ICD-10-CM

## 2025-01-17 DIAGNOSIS — Z7984 Long term (current) use of oral hypoglycemic drugs: Secondary | ICD-10-CM

## 2025-01-17 DIAGNOSIS — E1165 Type 2 diabetes mellitus with hyperglycemia: Secondary | ICD-10-CM

## 2025-01-17 LAB — POCT GLYCOSYLATED HEMOGLOBIN (HGB A1C): Hemoglobin A1C: 7.8 % — AB (ref 4.0–5.6)

## 2025-01-17 MED ORDER — GLIPIZIDE 10 MG PO TABS: 20.0000 mg | ORAL_TABLET | Freq: Two times a day (BID) | ORAL | 3 refills | Status: AC

## 2025-01-17 MED ORDER — METFORMIN HCL ER 500 MG PO TB24: 500.0000 mg | ORAL_TABLET | Freq: Two times a day (BID) | ORAL | 3 refills | Status: AC

## 2025-01-17 NOTE — Progress Notes (Signed)
 " Name: LANIS STORLIE  MRN/ DOB: [id number], July 04, 1941   Age/ Sex: 84 y.o., male    PCP: Domenica Harlene LABOR, MD   Reason for Endocrinology Evaluation: Type 2 Diabetes Mellitus     Date of Initial Endocrinology Visit: 10/19/2021    PATIENT IDENTIFIER: Mr. SAKET HELLSTROM is a 84 y.o. male with a past medical history of T2DM, Dyslipidemia and GERD . The patient presented for initial endocrinology clinic visit on 10/19/2021 for consultative assistance with his diabetes management.    HPI: Mr. Herder was    Diagnosed with DM years ago             Hemoglobin A1c has ranged from 7.1% in 2019, peaking at 7.9% in 2022   The patient declines any glycemic agents that would cause weight loss I have attempted to prescribe Januvia  in May, 2025 but this was cost prohibitive and the patient opted to see our CDE  I referral was placed to our CDE, but they attempted to contact him in May and left him a message but he decided against    SUBJECTIVE:   During the last visit (09/17/2024): A1c 8.2%     Today (01/17/25): Elsie JAYSON Hock  is here for a follow up on diabetes management . He  checks his blood sugars occasionally 0x daily.    Follows with Cardiology for CAD He is on patient assistance program for Farxiga   No nausea  Continues with mild constipation    HOME DIABETES REGIMEN: Glipizide  10 mg , 2 tablets before breakfast and 2 tablet before supper Metformin  500 mg 1 tabs BID  Farxiga  5 mg daily - through ot assistance    Statin: yes ACE-I/ARB: no    METER DOWNLOAD SUMMARY: n/a   DIABETIC COMPLICATIONS: Microvascular complications:  CKD III, ? retinopathy ( Right eye - stable ) , neuropathy  Denies: neuropathy  Last eye exam: Completed 10/30/2024  Macrovascular complications:  CAD , PVD Denies: CVA   PAST HISTORY: Past Medical History:  Past Medical History:  Diagnosis Date   Aortic calcification 02/09/2015   Appendicitis    Back pain 02/09/2015    CAD (coronary artery disease)    2 stents   CAD, multiple vessel 05/17/2016   CABG x6 vessel   Cataract    bilateral   Chicken pox    Diabetes mellitus type 2 in obese 04/11/2016   Diabetes type 2, controlled (HCC)    Dysphagia, unspecified(787.20) 03/20/2014   Hereditary and idiopathic peripheral neuropathy 02/09/2015   Hyperlipidemia    Hypertension    Hypothyroidism    Light headedness 07/04/2017   Measles    Medicare annual wellness visit, subsequent 06/14/2015   Melanoma (HCC)    Scalp. 2012   Mumps as a child   Thyroid  disease    [place] Hyperthyroidism   Past Surgical History:  Past Surgical History:  Procedure Laterality Date   APPENDECTOMY     BACK SURGERY     x2 lumbar   BACK SURGERY     late 1990s had 2 surgeries, first surgery lifted a heavy engine ruptured disds, at L4 and L5, cleaned discs no hardware very helpful. 2 years later while recovering from angiogram with weights applied to left leg caused a recurrence and required surgery again in same area with disc repair   BIOPSY  12/13/2022   Procedure: BIOPSY;  Surgeon: Charlanne Groom, MD;  Location: [facility];  Service: Gastroenterology;;   CARDIAC CATHETERIZATION     CARDIAC CATHETERIZATION  N/A 05/11/2016   Procedure: Left Heart Cath and Coronary Angiography;  Surgeon: Victory LELON Sharps, MD;  Location[facility] INVASIVE CV LAB;  Service: Cardiovascular;  Laterality: N/A;   CARDIAC CATHETERIZATION N/A 05/11/2016   Procedure: Intravascular Pressure Wire/FFR Study;  Surgeon: Victory LELON Sharps, MD;  Location: [facility] INVASIVE CV LAB;  Service: Cardiovascular;  Laterality: N/A;   COLONOSCOPY WITH PROPOFOL  N/A 12/13/2022   Procedure: COLONOSCOPY WITH PROPOFOL ;  Surgeon: Charlanne Groom, MD;  Location: [facility];  Service: Gastroenterology;  Laterality: N/A;   CORONARY ANGIOPLASTY WITH STENT PLACEMENT     2 stents   CORONARY ARTERY BYPASS GRAFT N/A 05/17/2016   Procedure: CORONARY ARTERY BYPASS GRAFTING (CABG) x 6 using left mammory  artery and right greater saphenous vein harvested endoscopically. [place] to LAD, SVG to Diagonal, SVG Sequential to OM2 and ramus intermediate, SVG Sequential to RCA and PDA;  Surgeon: Dallas KATHEE Jude, MD;  Location: [facility] OR;  Service: Open Heart Surgery;  Laterality: N/A;   ESOPHAGOGASTRODUODENOSCOPY (EGD) WITH PROPOFOL  N/A 12/13/2022   Procedure: ESOPHAGOGASTRODUODENOSCOPY (EGD) WITH PROPOFOL ;  Surgeon: Charlanne Groom, MD;  Location: [facility];  Service: Gastroenterology;  Laterality: N/A;   KNEE ARTHROSCOPY     right knee   LUMBAR LAMINECTOMY/DECOMPRESSION MICRODISCECTOMY Bilateral 04/10/2015   Procedure: LUMBAR LAMINECTOMY/DECOMPRESSION MICRODISCECTOMY 2 LEVELS;  Surgeon: Rockey Peru, MD;  Location: [facility];  Service: Neurosurgery;  Laterality: Bilateral;  Bilateral L45 L5S1 Laminectomy and Foraminotomy   MALONEY DILATION  12/13/2022   Procedure: MALONEY DILATION;  Surgeon: Charlanne Groom, MD;  Location: [facility];  Service: Gastroenterology;;   POLYPECTOMY  12/13/2022   Procedure: POLYPECTOMY;  Surgeon: Charlanne Groom, MD;  Location: [facility];  Service: Gastroenterology;;   ROTATOR CUFF REPAIR Right    SKIN CANCER EXCISION     melanoma on scalp   TEE WITHOUT CARDIOVERSION N/A 05/17/2016   Procedure: TRANSESOPHAGEAL ECHOCARDIOGRAM (TEE);  Surgeon: Dallas KATHEE Jude, MD;  Location: [facility] OR;  Service: Open Heart Surgery;  Laterality: N/A;   TONSILLECTOMY AND ADENOIDECTOMY     WISDOM TOOTH EXTRACTION      Social History:  reports that he has quit smoking. His smoking use included cigarettes. He started smoking about 41 years ago. He has a 82.1 pack-year smoking history. He has never used smokeless tobacco. He reports current alcohol use. He reports that he does not use drugs. Family History:  Family History  Problem Relation Age of Onset   Heart disease Mother        Deceased   Cancer Mother        breast   Hypertension Mother    Heart disease Father    Emphysema Father     Hypertension Father    Cancer Father        lung/ healthy   Diabetes Brother        type 2   Heart disease Brother    Pulmonary embolism Son        vasculiti   Depression Paternal Aunt    Cancer Maternal Grandmother        colon   Depression Paternal Grandmother      HOME MEDICATIONS: Allergies as of 01/17/2025   No Known Allergies      Medication List        Accurate as of January 17, 2025 10:04 AM. If you have any questions, ask your nurse or doctor.          aspirin  81 MG tablet Take 81 mg by mouth daily.  atorvastatin  80 MG tablet Commonly known as: LIPITOR TAKE 1 TABLET BY MOUTH EVERYDAY AT BEDTIME   Farxiga  5 MG Tabs tablet Generic drug: dapagliflozin  propanediol Take 1 tablet (5 mg total) by mouth daily before breakfast.   glipiZIDE  10 MG tablet Commonly known as: GLUCOTROL  Take 2 tablets (20 mg total) by mouth 2 (two) times daily before a meal.   levothyroxine  112 MCG tablet Commonly known as: SYNTHROID  Take 1 tablet (112 mcg total) by mouth daily.   metFORMIN  500 MG 24 hr tablet Commonly known as: GLUCOPHAGE -XR TAKE 1 TABLET (500 MG TOTAL) BY MOUTH IN THE MORNING AND AT BEDTIME   OneTouch Delica Lancets 30G Misc 1 Device by Does not apply route daily in the afternoon.   OneTouch Verio Flex System w/Device Kit USE TO CHECK BLOOD SUGAR ONCE A DAY AND AS NEEDED. DX CODE: E11.8   OneTouch Verio test strip Generic drug: glucose blood USE TO CHECK BLOOD SUGAR ONCE A DAY AND AS NEEDED. DX CODE: E11.8   pantoprazole  40 MG tablet Commonly known as: PROTONIX  Take 1 tablet (40 mg total) by mouth daily. Take 30 minutes before breakfast.         ALLERGIES: No Known Allergies      OBJECTIVE:   VITAL SIGNS: BP 124/78   Ht 6' (1.829 m)   Wt 189 lb (85.7 kg)   BMI 25.63 kg/m    PHYSICAL EXAM:  General: Pt appears well and is in NAD  Lungs: Clear with good BS bilat   Heart: RRR   Extremities:  Lower extremities - No pretibial edema   Neuro: MS is good with appropriate affect, pt is alert and Ox3     DM Foot Exam 09/17/2024   The skin of the feet shows left great toe bandage but no other ulcerations The pedal pulses are undetectable  The sensation is mostly absent to a screening 5.07, 10 gram monofilament bilaterally   DATA REVIEWED:  Lab Results  Component Value Date   HGBA1C 7.8 (A) 01/17/2025   HGBA1C 8.2 (A) 09/17/2024   HGBA1C 8.3 (A) 05/17/2024       Latest Reference Range & Units 01/17/25 10:24  Microalb, Ur mg/dL <9.7  MICROALB/CREAT RATIO <30 mg/g creat NOTE  Creatinine, Urine 20 - 320 mg/dL 45        ASSESSMENT / PLAN / RECOMMENDATIONS:   1) Type 2 Diabetes Mellitus, Sub- Optimally controlled, With CKD III, Neuropathic, retinopathic and macrovascular complications - Most recent A1c of 7.8 %. Goal A1c < 7.5%.    -A1c is trending down -Does not check glucose at home -Limited glycemic agents due to CKD and he declines any medications that would cause weight loss such as GLP-1 agonist, hence we have not been able to increase the Farxiga  which he obtains through patient assistance program -Januvia  has been cost prohibitive in the past -Was unable to see our CDE due to a high co-pay - No changes at this time, encouraged to continue to follow a low-carb diet and exercise   MEDICATIONS: Continue Farxiga  5 mg 1 tablet  Take glipizide  10 mg, 2 tablet before breakfast and 2 tablet before Supper Continue metformin  500 mg 1 tablet twice a day  EDUCATION / INSTRUCTIONS: BG monitoring instructions: Patient is instructed to check his blood sugars 1 times a day, fasting. Call [facility] if: BG persistently < 70  I reviewed the Rule of 15 for the treatment of hypoglycemia in detail with the patient. Literature supplied.  2) Diabetic complications:  Eye: Does  have known diabetic retinopathy.  Neuro/ Feet: Does  have known diabetic peripheral neuropathy. Renal: Patient does  have  known baseline CKD. He is not on an ACEI/ARB at present.   3)Dyslipidemia:     Medication Atorvastatin  80 mg daily    Follow-up in 6 months  Signed electronically by: Stefano Redgie Butts, MD  [facility] Endocrinology  [facility] [address] [place], [place] [address] Phone: [phone] FAX: [phone]   CC: Domenica Harlene LABOR, MD [address], [address] [place] [address] Phone: [phone]  Fax: [phone]    Return to Endocrinology clinic as below: Future Appointments  Date Time Provider Department Center  01/17/2025 10:10 AM Sarae Nicholes, Donell Redgie, MD [facility] None  11/04/2025 11:00 AM Antonio Cyndee Jamee JONELLE, DO [facility] [address]      "

## 2025-01-17 NOTE — Patient Instructions (Signed)
 Take  Glipizide  10 mg, 2 tablet before breakfast and 2 tablet before Supper  Continue Farxiga  5 mg, 1 tablet in the morning  Continue Metformin  500 mg ,1 tablet twice a day    HOW TO TREAT LOW BLOOD SUGARS (Blood sugar LESS THAN 70 MG/DL) Please follow the RULE OF 15 for the treatment of hypoglycemia treatment (when your (blood sugars are less than 70 mg/dL)   STEP 1: Take 15 grams of carbohydrates when your blood sugar is low, which includes:  3-4 GLUCOSE TABS  OR 3-4 OZ OF JUICE OR REGULAR SODA OR ONE TUBE OF GLUCOSE GEL    STEP 2: RECHECK blood sugar in 15 MINUTES STEP 3: If your blood sugar is still low at the 15 minute recheck --> then, go back to STEP 1 and treat AGAIN with another 15 grams of carbohydrates.

## 2025-01-20 ENCOUNTER — Encounter: Payer: Self-pay | Admitting: Internal Medicine

## 2025-01-21 ENCOUNTER — Ambulatory Visit: Payer: Self-pay | Admitting: Internal Medicine

## 2025-01-21 LAB — BASIC METABOLIC PANEL WITH GFR
BUN/Creatinine Ratio: 16 (calc) (ref 6–22)
BUN: 24 mg/dL (ref 7–25)
CO2: 19 mmol/L — ABNORMAL LOW (ref 20–32)
Calcium: 9.5 mg/dL (ref 8.6–10.3)
Chloride: 106 mmol/L (ref 98–110)
Creat: 1.47 mg/dL — ABNORMAL HIGH (ref 0.70–1.22)
Glucose, Bld: 230 mg/dL — ABNORMAL HIGH (ref 65–139)
Potassium: 4.3 mmol/L (ref 3.5–5.3)
Sodium: 139 mmol/L (ref 135–146)
eGFR: 47 mL/min/{1.73_m2} — ABNORMAL LOW

## 2025-01-21 LAB — LIPID PANEL
Cholesterol: 81 mg/dL
HDL: 38 mg/dL — ABNORMAL LOW
LDL Cholesterol (Calc): 21 mg/dL
Non-HDL Cholesterol (Calc): 43 mg/dL
Total CHOL/HDL Ratio: 2.1 (calc)
Triglycerides: 136 mg/dL

## 2025-01-21 LAB — MICROALBUMIN / CREATININE URINE RATIO
Creatinine, Urine: 45 mg/dL (ref 20–320)
Microalb, Ur: <0.2 mg/dL

## 2025-03-27 ENCOUNTER — Ambulatory Visit: Admitting: Family Medicine

## 2025-07-17 ENCOUNTER — Ambulatory Visit: Admitting: Internal Medicine

## 2025-09-25 ENCOUNTER — Encounter: Admitting: Family Medicine

## 2025-11-04 ENCOUNTER — Encounter: Admitting: Family Medicine
